# Patient Record
Sex: Female | Born: 1945 | Race: White | Hispanic: No | Smoking: Never smoker
Health system: Southern US, Community
[De-identification: ages and names within clinical notes are randomized; demographics above are authoritative.]

## PROBLEM LIST (undated history)

## (undated) DIAGNOSIS — F32A Depression, unspecified: Secondary | ICD-10-CM

## (undated) DIAGNOSIS — R251 Tremor, unspecified: Secondary | ICD-10-CM

## (undated) DIAGNOSIS — F419 Anxiety disorder, unspecified: Secondary | ICD-10-CM

## (undated) DIAGNOSIS — I10 Essential (primary) hypertension: Secondary | ICD-10-CM

## (undated) DIAGNOSIS — F329 Major depressive disorder, single episode, unspecified: Secondary | ICD-10-CM

## (undated) DIAGNOSIS — G20 Parkinson's disease: Secondary | ICD-10-CM

## (undated) DIAGNOSIS — E7800 Pure hypercholesterolemia, unspecified: Secondary | ICD-10-CM

## (undated) DIAGNOSIS — H269 Unspecified cataract: Secondary | ICD-10-CM

## (undated) DIAGNOSIS — F429 Obsessive-compulsive disorder, unspecified: Secondary | ICD-10-CM

## (undated) HISTORY — DX: Essential (primary) hypertension: I10

## (undated) HISTORY — DX: Pure hypercholesterolemia, unspecified: E78.00

## (undated) HISTORY — DX: Unspecified cataract: H26.9

## (undated) HISTORY — DX: Obsessive-compulsive disorder, unspecified: F42.9

## (undated) HISTORY — DX: Parkinson's disease: G20

## (undated) HISTORY — DX: Depression, unspecified: F32.A

## (undated) HISTORY — DX: Anxiety disorder, unspecified: F41.9

## (undated) HISTORY — DX: Major depressive disorder, single episode, unspecified: F32.9

## (undated) HISTORY — DX: Tremor, unspecified: R25.1

---
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
## 1979-02-16 HISTORY — PX: BREAST BIOPSY: SHX20

## 1998-04-22 ENCOUNTER — Other Ambulatory Visit: Admission: RE | Admit: 1998-04-22 | Discharge: 1998-04-22 | Payer: Self-pay | Admitting: Obstetrics and Gynecology

## 2006-03-20 ENCOUNTER — Emergency Department (HOSPITAL_COMMUNITY): Admission: EM | Admit: 2006-03-20 | Discharge: 2006-03-20 | Payer: Self-pay | Admitting: Emergency Medicine

## 2015-02-16 HISTORY — PX: CATARACT EXTRACTION: SUR2

## 2015-10-14 ENCOUNTER — Ambulatory Visit (INDEPENDENT_AMBULATORY_CARE_PROVIDER_SITE_OTHER): Payer: Medicare Other | Admitting: Neurology

## 2015-10-14 ENCOUNTER — Encounter: Payer: Self-pay | Admitting: Neurology

## 2015-10-14 VITALS — BP 164/90 | HR 73 | Ht 62.0 in | Wt 120.0 lb

## 2015-10-14 DIAGNOSIS — R5382 Chronic fatigue, unspecified: Secondary | ICD-10-CM | POA: Diagnosis not present

## 2015-10-14 DIAGNOSIS — E538 Deficiency of other specified B group vitamins: Secondary | ICD-10-CM

## 2015-10-14 DIAGNOSIS — G20C Parkinsonism, unspecified: Secondary | ICD-10-CM

## 2015-10-14 DIAGNOSIS — R413 Other amnesia: Secondary | ICD-10-CM

## 2015-10-14 DIAGNOSIS — R251 Tremor, unspecified: Secondary | ICD-10-CM | POA: Diagnosis not present

## 2015-10-14 DIAGNOSIS — G20A1 Parkinson's disease without dyskinesia, without mention of fluctuations: Secondary | ICD-10-CM | POA: Insufficient documentation

## 2015-10-14 DIAGNOSIS — G20 Parkinson's disease: Secondary | ICD-10-CM | POA: Diagnosis not present

## 2015-10-14 HISTORY — DX: Parkinson's disease: G20

## 2015-10-14 HISTORY — DX: Parkinsonism, unspecified: G20.C

## 2015-10-14 NOTE — Patient Instructions (Signed)
    We will get blood work today and get MRI evaluation of the brain.

## 2015-10-14 NOTE — Progress Notes (Signed)
Reason for visit: Tremor  Referring physician: Dr. Uvaldo Bristle is a 70 y.o. female  History of present illness:  Patricia Fitzpatrick is a 70 year old right-handed white female with a history of tremors that she states has been present for about 2 years. The tremors are noted on both arms, and are symmetric. She began first noticing some tremor on the jaw, a bit more prominent on the left side of the face than the right. The patient has reported some difficulty with handwriting with handwriting at times being somewhat small and sloppy. She has tremors as well noted with feeding herself. The tremors have not been extremely disabling for her, but are gradually worsening over time. She believes that a maternal grandmother may also have had tremors. The patient reports some difficulty with walking, she has had some occasional falls over the last 6 or 7 years, the last fall was about one year ago. The patient has also reports some mild changes with memory that have been present over the last 5 or 6 years. The patient has some mild short-term memory issues, occasionally she might get turned around with driving, she is able to keep up with medications and appointments fairly well. The patient has underlying psychiatric disease, followed by Dr. Toy Care for anxiety and OCD problems, the patient is only on Prozac, she has not been treated with any antipsychotic medications. She comes to this office for an evaluation.  Past Medical History:  Diagnosis Date  . Anxiety   . Cataracts, bilateral   . Depression   . High cholesterol   . Hypertension   . OCD (obsessive compulsive disorder)   . Tremor     Past Surgical History:  Procedure Laterality Date  . BREAST BIOPSY  1981  . EYE SURGERY  1952    Family History  Problem Relation Age of Onset  . Pulmonary disease Mother   . Alzheimer's disease Mother   . Heart disease Father   . Alzheimer's disease Father     Social history:  reports that she has  never smoked. She has never used smokeless tobacco. She reports that she drinks alcohol. She reports that she does not use drugs.  Medications:  Prior to Admission medications   Medication Sig Start Date End Date Taking? Authorizing Provider  FLUoxetine (PROZAC) 20 MG tablet Take 20 mg by mouth daily. Taking 3 tablets daily.   Yes Historical Provider, MD     Not on File  ROS:  Out of a complete 14 system review of symptoms, the patient complains only of the following symptoms, and all other reviewed systems are negative.  Weight loss, fatigue Palpitations of the heart Hearing loss, difficulty swallowing Itching Blurred vision, loss of vision, history of cataracts Shortness of breath, snoring Urinary incontinence easy bruising feeling hot, flushing Memory loss, confusion, numbness, weakness, slurred speech, difficulty swallowing, tremor Depression, anxiety, too much sleep, decreased energy, change in appetite, disinterest in activities, racing thoughts     Blood pressure (!) 164/90, pulse 73, height 5\' 2"  (1.575 m), weight 120 lb (54.4 kg).  Physical Exam  General: The patient is alert and cooperative at the time of the examination.  Eyes: Pupils are equal, round, and reactive to light. Discs are flat bilaterally.  Neck: The neck is supple, no carotid bruits are noted.  Respiratory: The respiratory examination is clear.  Cardiovascular: The cardiovascular examination reveals a regular rate and rhythm, no obvious murmurs or rubs are noted.  Skin: Extremities  are without significant edema.  Neurologic Exam  Mental status: The patient is alert and oriented x 3 at the time of the examination. The patient has apparent normal recent and remote memory, with an apparently normal attention span and concentration ability. Mini-Mental status examination done today shows a total score of 28/30. The patient is able to name 4 animals in 30 seconds.  Cranial nerves: Facial symmetry is  present. There is good sensation of the face to pinprick and soft touch bilaterally. The strength of the facial muscles and the muscles to head turning and shoulder shrug are normal bilaterally. Speech is well enunciated, no aphasia or dysarthria is noted. Extraocular movements are full. Visual fields are full. The tongue is midline, and the patient has symmetric elevation of the soft palate. No obvious hearing deficits are noted.The patient has prominent masking of the face, decreased blink frequency, some mild tremor involving the left lower face primarily.   Motor: The motor testing reveals 5 over 5 strength of all 4 extremities. Good symmetric motor tone is noted throughout.  Sensory: Sensory testing is intact to pinprick, soft touch, vibration sensation, and position sense on all 4 extremities. No evidence of extinction is noted.  Coordination: Cerebellar testing reveals good finger-nose-finger and heel-to-shin bilaterally.At times, minimal action type tremor seen in the upper extremities, right equal to left.   Gait and station: the patient is able to stand from a seated position with arms crossed. Once up, the patient is able to ambulate independently, but has prominent reduction in arm swing bilaterally. Tandem gait is minimally unsteady. Romberg is negative.   Reflexes: Deep tendon reflexes are symmetric and normal bilaterally. Toes are downgoing bilaterally.   Assessment/Plan:  1. Parkinsonism   2. Anxiety, OCD  3. Mild action tremor  4. Reported memory disturbance  The tremor itself does not appear to be consistent with Parkinson's disease, but the patient clearly has features of parkinsonism. She indicates that she has not been treated with any anti-psychotic medications. The patient will be sent for blood work today, she will have MRI of the brain. We will consider addition of medication such as Sinemet in low dose in the future. She will follow-up in 3 or 4 months. The memory  issues will be followed over time.  Patricia Alexanders MD 10/14/2015 8:49 AM  [facility] [address] [place], [place] [address]  Phone [phone] Fax [phone]

## 2015-10-15 LAB — VITAMIN B12: Vitamin B-12: 268 pg/mL (ref 211–946)

## 2015-10-15 LAB — SEDIMENTATION RATE: SED RATE: 2 mm/h (ref 0–40)

## 2015-10-15 LAB — TSH: TSH: 2.25 u[IU]/mL (ref 0.450–4.500)

## 2015-10-15 LAB — RPR: RPR Ser Ql: NONREACTIVE

## 2015-11-03 ENCOUNTER — Ambulatory Visit
Admission: RE | Admit: 2015-11-03 | Discharge: 2015-11-03 | Disposition: A | Discharge status: Home / Self Care | Payer: Medicare Other | Source: Ambulatory Visit | Attending: Neurology | Admitting: Neurology

## 2015-11-03 DIAGNOSIS — G20 Parkinson's disease: Secondary | ICD-10-CM

## 2015-11-03 DIAGNOSIS — R413 Other amnesia: Secondary | ICD-10-CM | POA: Diagnosis not present

## 2015-11-03 DIAGNOSIS — R251 Tremor, unspecified: Secondary | ICD-10-CM

## 2015-11-03 DIAGNOSIS — G20C Parkinsonism, unspecified: Secondary | ICD-10-CM

## 2015-11-04 ENCOUNTER — Telehealth: Payer: Self-pay | Admitting: Neurology

## 2015-11-04 NOTE — Telephone Encounter (Signed)
I called the patient. Minimal SVD, she is to go on low dose ASA, need to follow the blood pressure.     MRI brain 11/03/15:  IMPRESSION:  This MRI of the brain without contrast shows the following: 1.    Mild extent of T2/FLAIR hyperintense foci in the pons and subcortical deep white matter of the hemispheres consistent with mild chronic microvascular ischemic change. 2.    Minimal age appropriate cortical atrophy. 3.    There are no acute findings.

## 2015-11-04 NOTE — Telephone Encounter (Signed)
Pt called said Dr Jannifer Franklin told her this morning she had some mini strokes and had some questions. Please call [phone]

## 2015-11-04 NOTE — Telephone Encounter (Signed)
I called patient back. The small vessel changes are very minimal, but I would still recommend that the patient started baby aspirin a day.

## 2016-01-20 ENCOUNTER — Ambulatory Visit (INDEPENDENT_AMBULATORY_CARE_PROVIDER_SITE_OTHER): Payer: Medicare Other | Admitting: Neurology

## 2016-01-20 VITALS — BP 149/94 | HR 71 | Ht 62.0 in | Wt 122.0 lb

## 2016-01-20 DIAGNOSIS — R251 Tremor, unspecified: Secondary | ICD-10-CM

## 2016-01-20 DIAGNOSIS — G20 Parkinson's disease: Secondary | ICD-10-CM | POA: Diagnosis not present

## 2016-01-20 DIAGNOSIS — R413 Other amnesia: Secondary | ICD-10-CM | POA: Diagnosis not present

## 2016-01-20 MED ORDER — PRAMIPEXOLE DIHYDROCHLORIDE 0.125 MG PO TABS: ORAL_TABLET | ORAL | 3 refills | Status: DC

## 2016-01-20 NOTE — Progress Notes (Signed)
Reason for visit: Parkinson's disease  Patricia Fitzpatrick is an 70 y.o. female  History of present illness:  Patricia Fitzpatrick is a 70 year old right-handed white female with a history of tremor and a mild gait disorder. The patient also indicates that she is having some difficulty with memory. The patient has had symptoms of parkinsonism when last seen. This has evolved over time. The patient is having some stuttering steps at times when she tries to get up and walk, she has a tendency to go to the left. She has developed a tremor involving the tongue and the jaw. She has a left upper extremity tremor. She has not had any falls. She is not choking with swallowing. She continues to ruminate about her marital relationship and how she is under extreme stress with this. She is followed by Dr. Toy Care for her psychiatric issues. The patient returns for an evaluation.  Past Medical History:  Diagnosis Date  . Anxiety   . Cataracts, bilateral   . Depression   . High cholesterol   . Hypertension   . OCD (obsessive compulsive disorder)   . Parkinsonism ([place]) 10/14/2015  . Tremor     Past Surgical History:  Procedure Laterality Date  . BREAST BIOPSY  1981  . CATARACT EXTRACTION Right 2017  . EYE SURGERY  1952    Family History  Problem Relation Age of Onset  . Pulmonary disease Mother   . Alzheimer's disease Mother   . Heart disease Father   . Alzheimer's disease Father     Social history:  reports that she has never smoked. She has never used smokeless tobacco. She reports that she drinks alcohol. She reports that she does not use drugs.   No Known Allergies  Medications:  Prior to Admission medications   Medication Sig Start Date End Date Taking? Authorizing Provider  aspirin EC 81 MG tablet Take 81 mg by mouth daily.   Yes Historical Provider, MD  FLUoxetine (PROZAC) 20 MG capsule TAKE 3 CAPSULES BY MOUTH EVERY DAY 01/12/16  Yes Historical Provider, MD    ROS:  Out of a complete 14 system  review of symptoms, the patient complains only of the following symptoms, and all other reviewed systems are negative.  Bruising easily Numbness of the feet Tremors Agitation, decreased concentration, depression, anxiety Neck stiffness  Blood pressure (!) 149/94, pulse 71, height 5\' 2"  (1.575 m), weight 122 lb (55.3 kg).  Physical Exam  General: The patient is alert and cooperative at the time of the examination.  Skin: No significant peripheral edema is noted.   Neurologic Exam  Mental status: The patient is alert and oriented x 3 at the time of the examination. The patient has apparent normal recent and remote memory, with an apparently normal attention span and concentration ability. Mini-Mental Status Examination done today shows a total score 29/30.   Cranial nerves: Facial symmetry is present. Speech is normal, no aphasia or dysarthria is noted. Extraocular movements are full. Visual fields are full. Masking of the face is present, decreased eye blink is seen.  Motor: The patient has good strength in all 4 extremities.  Sensory examination: Soft touch sensation is symmetric on the face, arms, and legs.  Coordination: The patient has good finger-nose-finger and heel-to-shin bilaterally. A resting tremors noted with the left upper extremity on a present with walking as well.  Gait and station: The patient is able to arise from a seated position with arms crossed. Once up, the patient  ambulates without assistance, tremor seen with the left upper extremity, decreased arm swing is seen on the left. Tandem gait is normal. Romberg is negative. No drift is seen.  Reflexes: Deep tendon reflexes are symmetric.   Assessment/Plan:  1. Parkinson's disease  2. Mild memory disturbance  The patient is developing features of Parkinson's disease at this time, I have recommended initiating low-dose Mirapex. The patient seems to be hesitant about this medication therapy. She has agreed to  allow me to call in a prescription at this time, she will follow-up in 4-5 months. The patient continues to have significant overlying psychiatric overlay with her medical encounters.   Jill Alexanders MD 01/20/2016 8:41 AM  [facility] [address] [place], [place] [address]  Phone [phone] Fax [phone]

## 2016-01-20 NOTE — Patient Instructions (Addendum)
Call  If you desire to start medications for the Parkinson's disease.  Parkinson Disease Parkinson disease is a long-term (chronic) condition that gets worse over time (is progressive). Parkinson disease limits your ability to control your movements and move your body normally. This condition is a type of movement disorder. Each person with Parkinson disease is affected differently. The condition can range from mild to severe. Parkinson disease tends to progress slowly over several years. What are the causes? Parkinson disease results from a loss of brain cells (neurons) in a specific part of the brain (substantia nigra). Some of the neurons in the substantia nigra make an important brain chemical (dopamine). Dopamine is needed to control movement. As the condition gets worse, neurons make less dopamine. This makes it hard to move or control your movements. The exact cause of why neurons are lost or produce less dopamine is not known. Genetic and environmental factors may contribute to the cause of Parkinson disease. What increases the risk? This condition is more likely to develop in:  Men.  People who are 76 years of age or older.  People who have a family history of Parkinson disease. What are the signs or symptoms? Symptoms of this condition can vary from person to person. The main (primary) symptoms are related to movement (motor symptoms). These include:  Uncontrolled shaking movements (tremor). Tremors usually start in a hand or foot when you are resting (resting tremor). The tremor may stop when you move around.  Slowing of movement. You may lose facial expression and have trouble making the small movements that are needed to button clothing or brush your teeth. You may walk with short, shuffling steps.  Stiff movement (rigidity). This mostly affects your arms, legs, neck, and upper body. You may walk without swinging your arms. Rigidity can be painful.  Loss of balance and  stability when standing. You may sway, fall backward, and have trouble making turns. Secondary motor symptoms of this condition include:  Shrinking handwriting.  Stooped posture.  Slowed speech.  Trouble swallowing.  Drooling.  Sexual dysfunction.  Muscle cramps.  Loss of smell. Additional symptoms that are not related to movement include:  Constipation.  Mood swings.  Depression or anxiety.  Sleep disturbances.  Confusion.  Loss of mental abilities (dementia).  Low blood pressure.  Trouble concentrating. How is this diagnosed? Parkinson disease can be hard to diagnose in its early stages. A diagnosis may be made based on symptoms, a medical history, and physical exam. During your exam, your health care provider will look for:  Lack of facial expression.  Resting tremor.  Stiffness in your neck, arms, and legs.  Abnormal walk.  Trouble with balance. You may have brain imaging tests done to check for a loss of dopamine-producing areas of the brain. Your healthcare provider may also grade the severity of your condition as mild, moderate, or advanced. Parkinson disease progression is different for everyone. You may not progress to the advanced stage. Mild Parkinson disease involves:  Movement problems that do not affect daily activities.  Movement problems on one side of the body.  Movement problems that are controlled with medicines.  Good response to exercise. Moderate Parkinson disease involves:  Movement problems on both sides of the body.  Slowing of movement.  Coordination and balance problems.  Less of a response to medicine.  More side effects from medicines. Advanced Parkinson disease involves:  Extreme difficulty walking.  Inability to live alone safely.  Signs of dementia.  Difficulty controlling  symptoms with medicine. How is this treated? There is no cure for Parkinson disease. Treatment focuses on relieving your symptoms.  Treatment may include:  Medicines.  Speech, occupational, and physical therapy.  Surgery. Everyone responds to medicines differently. Your response may change over time. Work with your health care provider to find the best medicines for you. These may include:  Dopamine replacement drugs. These are the most effective medicines. A long-term side effect of these medicines is uncontrolled movements (dyskinesias).  Dopamine agonists. These drugs act like dopamine to stimulate dopamine receptors in the brain. Side effects include nausea and sleepiness, but they cause less dyskinesia.  Other medicines to reduce tremor, prevent dopamine breakdown, reduce dyskinesia, and reduce dementia that is related to Parkinson disease. Another treatment is deep brain stimulation surgery to reduce tremors and dyskinesia. This procedure involves placing electrodes in the brain. The electrodes are attached to an electric pulse generator that acts like a pacemaker for your brain. This may be an option if you have had the condition for at least four years and are not responding well to medicines. Follow these instructions at home:  Take over-the-counter and prescription medicines only as told by your health care provider.  Install grab bars and railings in your home to prevent falls.  Follow instructions from your health care provider about eating or drinking restrictions.  Return to your normal activities as told by your health care provider. Ask your health care provider what activities are safe for you.  Get regular exercise as told by your health care provider or a physical therapist.  Keep all follow-up visits as told by your health care provider. This is important. These include any visits with a speech therapist or occupational therapist.  Consider joining a support group for people with Parkinson disease. Contact a health care provider if:  Medicines do not help your symptoms.  You are unsteady or  have fallen at home.  You need more support to function well at home.  You have trouble swallowing.  You have severe constipation.  You are struggling with side effects from your medicines.  You see or hear things that are not real (hallucinate).  You feel confused, anxious, or depressed. Get help right away if:  You are injured after a fall.  You cannot swallow without choking.  You have chest pain or trouble breathing.  You do not feel safe at home. This information is not intended to replace advice given to you by your health care provider. Make sure you discuss any questions you have with your health care provider. Document Released: 01/30/2000 Document Revised: 07/07/2015 Document Reviewed: 11/22/2014 Elsevier Interactive Patient Education  2017 [employer].

## 2016-01-28 ENCOUNTER — Telehealth: Payer: Self-pay | Admitting: Neurology

## 2016-01-28 NOTE — Telephone Encounter (Signed)
Patient is calling to discuss the MRI she had about 3 months ago. She has questions.

## 2016-01-28 NOTE — Telephone Encounter (Signed)
Returned pt TC and reviewed MRI results with her again. Also answered questions re: Parkinson's disease and Mirapex. Verbalized understanding. Continues to voice concern about stress in her home environment due to caring for her husband w/ dementia and dealing w/ her "step-family." Appreciative of call.

## 2016-02-23 ENCOUNTER — Telehealth: Payer: Self-pay | Admitting: Neurology
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
## 2016-02-27 NOTE — Telephone Encounter (Signed)
I called the patient. She indicates that she had not been taking her Mirapex properly, she has gotten back on schedule now. She is still having some discomfort in her low back, she is taking Tylenol for this, she does not wish to pursue x-rays of the low back yet, but if the pain continues she is to call us back.

## 2016-02-27 NOTE — Telephone Encounter (Signed)
Pt is still experiencing pain.  Said she realized she has not been taking pramipexole (MIRAPEX) 0.125 MG tablet correctly. Pt said she called pharmacist and he advised her what to do. Please call

## 2016-03-08 MED ORDER — PREDNISONE 5 MG PO TABS
ORAL_TABLET | ORAL | 0 refills | Status: DC
Start: 2016-03-08 — End: 2016-06-22

## 2016-03-08 MED ORDER — METHOCARBAMOL 500 MG PO TABS: 500.0000 mg | ORAL_TABLET | Freq: Three times a day (TID) | ORAL | 1 refills | Status: DC

## 2016-03-08 NOTE — Addendum Note (Signed)
Addended by: Margette Fast on: 03/08/2016 11:01 AM   Modules accepted: Orders

## 2016-03-08 NOTE — Telephone Encounter (Signed)
I called patient. The patient is having ongoing low back pain. She is taking Tylenol for this taking 650 mg 6 times daily, I recommended to reduce this to 4 times a day.  I will call in a prednisone Dosepak, and Robaxin for the back pain, if she is not getting better, MRI of the back may need to be done.

## 2016-03-08 NOTE — Telephone Encounter (Signed)
Pt called said she has been taking [facility] arthritis strength tylenol for the pain but directions indicate not to take more than 10 days and it is getting close to the 10 day mark. She is also wanting to know if she should continue taking [facility] acetaminophen  500mg .  She thinks they could be helping.

## 2016-03-11 ENCOUNTER — Telehealth: Payer: Self-pay | Admitting: *Deleted

## 2016-03-11 NOTE — Telephone Encounter (Signed)
Pt. sts. she has had worsening constipation over the last month--believes this is due to Mirapex.  Sts. last stool was yesterday, smaller, harder than normal, much straining.  Is taking otc ducusate but not regularly.  I have advised taking stool softener more regularly, increasing fluids.  Would you like her to do anything else?

## 2016-03-11 NOTE — Telephone Encounter (Signed)
I called patient. The patient will take Colace and increase fluid intake, if this does not help, then adding MiraLAX on a daily basis should be done. I discussed this with the patient.

## 2016-04-06 ENCOUNTER — Telehealth: Payer: Self-pay | Admitting: Neurology

## 2016-04-06 NOTE — Telephone Encounter (Signed)
Dr Jannifer Franklin- please advise. I see back on 03/08/16 you stated if pain persisted, would recommend MRI.

## 2016-04-06 NOTE — Telephone Encounter (Signed)
I called patient. She is having mild mid back and thoracic discomfort mainly with bending and stooping. I have offered physical therapy, she does not wish to consider this option at this time. She is off of her Tylenol, she can get back on this for the discomfort. She will call me if she wishes to undergo physical therapy.

## 2016-04-06 NOTE — Telephone Encounter (Signed)
Patient having numbness, burning, stinging and tingling in her upper back for  6-7 weeks. Could this be from Parkinson's? Please call and advise.

## 2016-06-03 ENCOUNTER — Other Ambulatory Visit: Payer: Self-pay | Admitting: Neurology

## 2016-06-22 ENCOUNTER — Encounter (INDEPENDENT_AMBULATORY_CARE_PROVIDER_SITE_OTHER): Payer: Self-pay

## 2016-06-22 ENCOUNTER — Encounter: Payer: Self-pay | Admitting: Neurology

## 2016-06-22 ENCOUNTER — Ambulatory Visit (INDEPENDENT_AMBULATORY_CARE_PROVIDER_SITE_OTHER): Payer: Medicare Other | Admitting: Neurology

## 2016-06-22 DIAGNOSIS — R413 Other amnesia: Secondary | ICD-10-CM

## 2016-06-22 DIAGNOSIS — G20 Parkinson's disease: Secondary | ICD-10-CM

## 2016-06-22 MED ORDER — PRAMIPEXOLE DIHYDROCHLORIDE 0.5 MG PO TABS: 0.5000 mg | ORAL_TABLET | Freq: Three times a day (TID) | ORAL | 4 refills | Status: DC

## 2016-06-22 NOTE — Progress Notes (Signed)
Reason for visit: Parkinson's disease  Patricia Fitzpatrick is an 71 y.o. female  History of present illness:  Patricia Fitzpatrick is a 71 year old right-handed white female with a history of parkinsonism. The patient has been placed on Mirapex in low dose, she is currently on 0.25 mg 3 times daily and is tolerating the dose well. The patient does have some constipation issues, she takes docusate on a regular basis. She reports that she has had an occasional fall without injury since last seen. She has some difficulty getting up from a chair. She denies incontinence swallowing or choking. She is sleeping fairly well at night. She has developed some low back pain issues, she takes Tylenol on a scheduled basis, 4 times a day for this. She has not wished to undergo physical therapy mainly because of transportation issues. She has good days and bad days with the low back pain. She reports that her memory has been relatively stable since last seen. She has not had any hallucinations on the medication. She returns to this office for an evaluation.   Past Medical History:  Diagnosis Date  . Anxiety   . Cataracts, bilateral   . Depression   . High cholesterol   . Hypertension   . OCD (obsessive compulsive disorder)   . Parkinsonism ([place]) 10/14/2015  . Tremor     Past Surgical History:  Procedure Laterality Date  . BREAST BIOPSY  1981  . CATARACT EXTRACTION Right 2017  . EYE SURGERY  1952    Family History  Problem Relation Age of Onset  . Pulmonary disease Mother   . Alzheimer's disease Mother   . Heart disease Father   . Alzheimer's disease Father     Social history:  reports that she has never smoked. She has never used smokeless tobacco. She reports that she drinks alcohol. She reports that she does not use drugs.   No Known Allergies  Medications:  Prior to Admission medications   Medication Sig Start Date End Date Taking? Authorizing Provider  aspirin 81 MG tablet Take 81 mg by mouth daily.    Yes [provider]  FLUoxetine (PROZAC) 20 MG capsule TAKE 3 CAPSULES BY MOUTH EVERY DAY 01/12/16  Yes [provider]  pramipexole (MIRAPEX) 0.125 MG tablet Take 2 tablets by mouth three times daily 06/03/16  Yes Kathrynn Ducking, MD    ROS:  Out of a complete 14 system review of symptoms, the patient complains only of the following symptoms, and all other reviewed systems are negative.  Tremor Memory disturbance  There were no vitals taken for this visit.  Physical Exam  General: The patient is alert and cooperative at the time of the examination.  Skin: No significant peripheral edema is noted.   Neurologic Exam  Mental status: The patient is alert and oriented x 3 at the time of the examination. The patient has apparent normal recent and remote memory, with an apparently normal attention span and concentration ability. Mini-Mental Status Examination done today shows a total score 29/30.   Cranial nerves: Facial symmetry is present. Speech is normal, no aphasia or dysarthria is noted. Extraocular movements are full. Visual fields are full. Masking of the face is seen.  Motor: The patient has good strength in all 4 extremities.  Sensory examination: Soft touch sensation is symmetric on the face, arms, and legs.  Coordination: The patient has good finger-nose-finger and heel-to-shin bilaterally.  Gait and station: The patient is able to arise from  a seated position with arms crossed. Once up, she is able to ambulate independently, there is bilateral arm swing, but this is decreased on the left relative to the right. Tandem gait is unsteady. Romberg is negative. No drift is seen.  Reflexes: Deep tendon reflexes are symmetric.   Assessment/Plan:  1. Parkinson's disease  2. Mild memory disturbance  3. Chronic low back pain  The patient will be increased on Mirapex gradually phasing in a 0.5 mg tablet once each week until she is on 0.5 mg 3 times daily.  The patient will call me if she has side effects on the medication. She will follow-up in 5 months. We will continue to follow the memory issue. The patient will let me know if she requires further treatment for the low back pain. A prescription for the 0.5 mg Mirapex tablets was called in.  Jill Alexanders MD 06/22/2016 8:05 AM  [facility] [address] [place], [place] [address]  Phone [phone] Fax [phone]

## 2016-06-22 NOTE — Patient Instructions (Signed)
   We will go up on the mirapex to 0.5 mg three times a day. We will transition slowly.  Week 1 take 0.25 / 0.25 / 0.5   Week 2 take 0.5 / 0.25 / 0.5   Week 3 take 0.5 three times a day.   You are on the 0.125 mg tablets currently taking 2 three times a day. Two of the 0.125 mg tablets equal 0.25 mg  Mirapex (pramipexole) may result in confusion or hallucinations, dizziness, drowsiness, or nausea. If any significant side effects are noted, please contact our office.

## 2016-07-01 ENCOUNTER — Telehealth: Payer: Self-pay | Admitting: Neurology

## 2016-07-01 DIAGNOSIS — M545 Low back pain: Principal | ICD-10-CM

## 2016-07-01 DIAGNOSIS — G8929 Other chronic pain: Secondary | ICD-10-CM

## 2016-07-01 MED ORDER — PREDNISONE 5 MG PO TABS: ORAL_TABLET | ORAL | 0 refills | Status: DC

## 2016-07-01 MED ORDER — METHOCARBAMOL 500 MG PO TABS: 500.0000 mg | ORAL_TABLET | Freq: Three times a day (TID) | ORAL | 0 refills | Status: DC

## 2016-07-01 NOTE — Addendum Note (Signed)
Addended by: Kathrynn Ducking on: 07/01/2016 11:21 AM   Modules accepted: Orders

## 2016-07-01 NOTE — Telephone Encounter (Signed)
Patient called office in reference to picking/moving bag of laundry.  Patient said it kills her to get out of the bed and while moving back will start hurting and tighten up.  Patient would like to see if Dr. Jannifer Franklin is able to call in something for the pain/discomfort.  Pharmacy- [facility].  Please call

## 2016-07-01 NOTE — Telephone Encounter (Signed)
I called patient. The patient hurt her back several days ago lifting a bag of laundry. She has had back pain issues previously.  The patient has pain in the right lower back, no radiation down the leg.  I will send in a prescription for a prednisone Dosepak, 5 mg 6 day pack, and a 10 day supply of methocarbamol.

## 2016-07-05 MED ORDER — TRAMADOL HCL 50 MG PO TABS: 50.0000 mg | ORAL_TABLET | Freq: Four times a day (QID) | ORAL | 1 refills | Status: DC | PRN

## 2016-07-05 NOTE — Telephone Encounter (Signed)
Patient has called again and requested to know when someone is going to call her back. I informed her that the nurse and Dr. Jannifer Franklin are in with patients but I would send another phone note back.

## 2016-07-05 NOTE — Telephone Encounter (Signed)
The patient is having ongoing pain of the low back without radiation down the legs. The pain is severe. She is on Robaxin, prednisone Dosepak was given without benefit, I will call in a prescription for Ultram, we will get MRI of the lumbar spine.

## 2016-07-05 NOTE — Telephone Encounter (Signed)
Pt wants to know if she is to continue with the prednisone while taking tramadol. Please call

## 2016-07-05 NOTE — Telephone Encounter (Signed)
Pt calling back to Dr Jannifer Franklin asking if something else can be called in for her discomfort, she has mentioned her pain and need of a stronger muscle relaxer

## 2016-07-05 NOTE — Telephone Encounter (Signed)
I called the patient. She is to finish the prednisone.

## 2016-07-05 NOTE — Telephone Encounter (Signed)
Patient called back requesting to speak with the nurse or Dr. Jannifer Franklin regarding the rx she requested. She states that it has not been received by the pharmacy.

## 2016-07-05 NOTE — Addendum Note (Signed)
Addended by: Kathrynn Ducking on: 07/05/2016 01:12 PM   Modules accepted: Orders

## 2016-07-05 NOTE — Telephone Encounter (Signed)
Called to let patient know I faxed printed/signed rx tramadol to her pharmacy. Received confirmation.   Also advised MRI will have to be approved via insurance first and then someone will call to schedule. She verbalized understanding.

## 2016-07-06 MED ORDER — CYCLOBENZAPRINE HCL 5 MG PO TABS: 5.0000 mg | ORAL_TABLET | Freq: Three times a day (TID) | ORAL | 1 refills | Status: DC

## 2016-07-06 NOTE — Telephone Encounter (Signed)
I called patient. She is still having significant back pain. The prednisone and the Ultram have not been very helpful. The methocarbamol is not helping, we will try Flexeril instead. The MRI of the low back is pending.

## 2016-07-06 NOTE — Telephone Encounter (Signed)
Pt called back today said she is having severe pain and wants to know whether she should push thru the pain or go slowly with movements? Please call

## 2016-07-06 NOTE — Addendum Note (Signed)
Addended by: Kathrynn Ducking on: 07/06/2016 06:32 PM   Modules accepted: Orders

## 2016-07-07 ENCOUNTER — Telehealth: Payer: Self-pay | Admitting: Neurology

## 2016-07-07 ENCOUNTER — Encounter: Payer: Self-pay | Admitting: *Deleted

## 2016-07-07 MED ORDER — HYDROMORPHONE HCL 2 MG PO TABS: 2.0000 mg | ORAL_TABLET | ORAL | 0 refills | Status: DC | PRN

## 2016-07-07 NOTE — Telephone Encounter (Signed)
I called patient. I will write a prescription for Dilaudid, she will need to come in and pick it up.

## 2016-07-07 NOTE — Telephone Encounter (Signed)
I called patient. She was given a prescription for Flexeril taking 5 mg 3 times a day. I told her to take the medication as prescribed, not take the medication 4 times a day.

## 2016-07-07 NOTE — Telephone Encounter (Signed)
Pt calling back asking for a call with your suggestion as to what hours during the day should she take the medication for the maximum effect.  Pt would also like to know if she can take 4 instead of 3. Please call on mobile

## 2016-07-07 NOTE — Progress Notes (Signed)
Placed printed/signed rx dilaudid up front for pt pick up.

## 2016-07-07 NOTE — Telephone Encounter (Signed)
Pt called back said she would like to take the pain medication that was discussed this morning. Said her daughter-in-law will pick up RX today, she is not on DPR, she is aware and said she does not know how to get it. Pt request to speak with Dr Jannifer Franklin.

## 2016-07-07 NOTE — Telephone Encounter (Signed)
Patient called for ongoing back pain through answering service, and called back 20 minutes later to cancel the request for call back. CD

## 2016-07-07 NOTE — Addendum Note (Signed)
Addended by: Kathrynn Ducking on: 07/07/2016 11:51 AM   Modules accepted: Orders

## 2016-07-12 ENCOUNTER — Telehealth: Payer: Self-pay | Admitting: Neurology

## 2016-07-12 NOTE — Telephone Encounter (Signed)
Patricia Fitzpatrick called me 3 times on Friday in PM , reporting she had most severe pain in her back and hip.  She wanted Tramadol, which cannot be refilled on the week end. She understood , but sounded incoherent. I advised her to try heat and ice , and to call back on Tuesday. She also wanted a CT back to be converted to an MRI of the back- I asked her to discuss that with You, Lanny Hurst.  CD

## 2016-07-16 ENCOUNTER — Telehealth: Payer: Self-pay | Admitting: Neurology

## 2016-07-16 NOTE — Telephone Encounter (Signed)
Pt called in stated that she had LBP since January and has been on tramadol. But no significant improvement. Dr. Jannifer Franklin prescribed dilaudid to her on 07/07/16. However, she did not come to pick up the prescription until yesterday. Her back pain actually getting better recently. Today she filled the medication and on reading the side effects of dilaudid, she had concern about side effect of constipation and her pharmacist warned her to discuss with doctor before taking the medication. She called in and worry about the side effects.   I told her that dilaudid is a strong pain medication. If her back pain is actually getting better, she can continue tramadol and no need to take dilaudid. If the pain getting worse, she wants to give dilaudid a try, I think it is reasonable. She expressed understanding and appreciation.  Rosalin Hawking, MD PhD Stroke Neurology 07/16/2016 2:05 PM

## 2016-07-17 ENCOUNTER — Ambulatory Visit
Admission: RE | Admit: 2016-07-17 | Discharge: 2016-07-17 | Disposition: A | Discharge status: Home / Self Care | Payer: Medicare Other | Source: Ambulatory Visit | Attending: Neurology | Admitting: Neurology

## 2016-07-17 DIAGNOSIS — G8929 Other chronic pain: Secondary | ICD-10-CM

## 2016-07-17 DIAGNOSIS — M545 Low back pain: Principal | ICD-10-CM

## 2016-07-19 ENCOUNTER — Telehealth: Payer: Self-pay | Admitting: Neurology

## 2016-07-19 DIAGNOSIS — S22000A Wedge compression fracture of unspecified thoracic vertebra, initial encounter for closed fracture: Secondary | ICD-10-CM

## 2016-07-19 NOTE — Telephone Encounter (Signed)
Patient calling back saying she has had changes in bowel and urine. Please call to discuss.

## 2016-07-19 NOTE — Telephone Encounter (Signed)
Pt called back. She is needing clarification: 1) significant compression fx- could that have likely happened 02/19/16 when she dropped the microwave and hurt her back 2) please clarify nerve draped over the spine (did she hear that correctly) 3) extremely limited- does that mean when she is in significant pain or a twinge Please call

## 2016-07-19 NOTE — Telephone Encounter (Signed)
Pt has called requested Dr Jannifer Franklin to call again please. She needs clarification.

## 2016-07-19 NOTE — Telephone Encounter (Signed)
I called patient. The MRI shows a significant compression fracture at the T11 level with a posterior bulge that does not appear to be injuring the spinal cord, but the spinal cord is draped over the bony fragment. The patient does not report any bowel or bladder control issues or leg weakness. I have recommended a nurse surgical consultation if the pain level is intolerable or the patient is limited in her daily activities because of discomfort.  She will call me back if she decides that she wishes to have a neurosurgical consultation.   MRI lumbar 07/18/16:  IMPRESSION:  This MRI of the lumbar spine without contrast shows the following: 1.    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  There is also mild retrolisthesis at this level. 2.    Mild degenerative changes in the lumbar spine with mild facet hypertrophy noted at most levels.   There is no nerve root compression. There is no spinal stenosis in the lumbar spine.

## 2016-07-19 NOTE — Telephone Encounter (Signed)
   I called the patient. I discussed the MRI again with her. She is to call if the pain remains severe.  She has had some problems with constipation while on pain medications. She may need to use MiraLAX on a daily basis, she has been using Dulcolax.

## 2016-07-19 NOTE — Telephone Encounter (Signed)
Called patient back. Went over MRI results again. She will call if pain remains severe and we can place referral for neurosurgery. She verbalized understanding and appreciation for call.

## 2016-07-20 NOTE — Telephone Encounter (Signed)
Patient called office and would like to speak with Dr. Jannifer Franklin about proceeding with the referral to neurosurgery.  Please call

## 2016-07-20 NOTE — Telephone Encounter (Signed)
I called patient. Patient wishes to have a consultation with a neurosurgeon. I will get this set up.

## 2016-07-20 NOTE — Addendum Note (Signed)
Addended by: Kathrynn Ducking on: 07/20/2016 01:10 PM   Modules accepted: Orders

## 2016-07-21 NOTE — Telephone Encounter (Signed)
I called patient. The patient wanted to know how much restriction she should have on her activity, she is not to do any heavy lifting more than 10 or 15 pounds, she should not perform activities such as climbing a letter that might put her at risk for falls.  We are awaiting consultation from a neurosurgeon.

## 2016-07-21 NOTE — Telephone Encounter (Signed)
Patient wants a call back to discuss her back. She is waiting a call from the neurosurgeons office and I suggested that she call neurosurgeon but she wants a call back from our office.

## 2016-07-22 ENCOUNTER — Other Ambulatory Visit: Payer: Self-pay | Admitting: Neurology

## 2016-07-26 ENCOUNTER — Telehealth: Payer: Self-pay | Admitting: Neurology

## 2016-07-26 NOTE — Telephone Encounter (Signed)
error 

## 2016-07-26 NOTE — Telephone Encounter (Signed)
Pt said she has decided to use miralax. She is wanting if there are any special instructions.

## 2016-07-26 NOTE — Telephone Encounter (Signed)
Tried calling pt back. Phone continued to ring, unable to LVM.

## 2016-07-27 NOTE — Telephone Encounter (Signed)
Pt would like a call back to clarify once again how she is to take the Miralax, please call

## 2016-07-27 NOTE — Telephone Encounter (Signed)
Called patient back. Advised her I see previously where CW,MD suggested she take Miralax once daily. Follow directions on bottle. Do not take more than once per day. Can mix powder in 8oz water, ect. She verbalized understanding and appreciation for call.

## 2016-07-27 NOTE — Telephone Encounter (Signed)
Called and spoke with patient. Went over instructions again. She verbalized understanding.

## 2016-08-01 ENCOUNTER — Telehealth: Payer: Self-pay | Admitting: Neurology

## 2016-08-01 NOTE — Telephone Encounter (Signed)
She called Sunday night that she was experiencing more pain (pain was better for a couple weeks but worsened when this afternoon).  She had taken a tramadol without benefit and wanted to know if ok to take a hydromorphone.  I let her know she could do so.  If concerned about the medication, she could try a 1/2 dose first.  I let her know I would pass the message to you

## 2016-08-03 NOTE — Telephone Encounter (Signed)
Patient called office in reference to taking HYDROmorphone (DILAUDID) 2 MG tablet patient took a whole tablet at 6:30pm and again at 9:30am.  Patient states as of right now she is not in any pain unless she is having to weight/pressure on her hands she will see stars due to pain in lower spinal area.

## 2016-08-03 NOTE — Telephone Encounter (Signed)
I called patient. The patient has had some increased pain, she has taken hydromorphone for this with some benefit. She does have an appointment to see a nurse surgeon in the near future. She has intermittent muscle spasms of the back. I indicated that is okay to take Tylenol with the hydromorphone.

## 2016-08-07 ENCOUNTER — Telehealth: Payer: Self-pay | Admitting: Neurology

## 2016-08-07 NOTE — Telephone Encounter (Signed)
Patient called regarding taking her pain med, hydromorphone due to back pain.  She had additional questions about her back injury.  She has an appointment next week with a spine specialist and was advised to AVOID narcotic pain medication due to side effect and complication concerns, d/t underlying parkinsonism and age above 52. She is encouraged to limit her pain medication to tylenol and tramadol prn.  I encouraged her to address her back injury/damage related questions with the spine doctor, thankfully, appt pending soon.  She voiced understanding and agreement. She asked if she should keep or discard her hydromorphone tabs and was advised to ask Dr. Jannifer Franklin for further guidance in this matter, encouraged to call back during work week for this.

## 2016-08-09 NOTE — Telephone Encounter (Signed)
Pt wanted Dr Jannifer Franklin to be made aware that she went back on her tramadol and it has helped her tremendously, she is asking to be called re: the spasms in her back

## 2016-08-09 NOTE — Telephone Encounter (Signed)
I called the patient. The Flexeril can be taken for the back spasms.  The patient can take the Dilaudid if she needs it, but if the Ultram is working for the pain, she will stay with the Ultram.

## 2016-08-09 NOTE — Telephone Encounter (Signed)
Patient is returning Dr. Jannifer Franklin' call. She can be reached at [phone].

## 2016-08-09 NOTE — Telephone Encounter (Signed)
I called the patient. She has Flexeril at home taking the 5 mg tablets. She can take 1 every 8 hours if needed for neck spasms.  She will be seeing the neurosurgeon this week.

## 2016-08-11 NOTE — Telephone Encounter (Signed)
Patricia Fitzpatrick calling stating she saw Dr. Vertell Limber yesterday had x-rays and went over MRI results and scheduled another MRI. She had a little fall last night but is ok today. She took HYDROmorphone (DILAUDID) 2 MG tablet around 2:30 this morning and traMADol (ULTRAM) 50 MG tablet around 8:30 this morning.

## 2016-08-11 NOTE — Telephone Encounter (Signed)
Events noted, I did not call the patient.

## 2016-08-16 NOTE — Telephone Encounter (Signed)
I called patient. The patient is having constipation on pain medications and inactivity. She is not drinking a lot of fluids.  She will increase her fluid intake, she is taking MiraLAX which she will continue. She will go on Dulcolax tablets.

## 2016-08-16 NOTE — Telephone Encounter (Addendum)
Pt called said she is constipated again. She is not eating or drinking like she should. Said she is in a lot of pain with her back.Please call her back at [phone]

## 2016-08-16 NOTE — Telephone Encounter (Signed)
Returned call to patient - she is still having significant back pain, despite taking Dilaudid 2mg , QID, Flexeril 5mg , TID, and OTC Tylenol.  She has been evaluated by Dr. Vertell Limber - states she has MRI scans pending (thoracic and repeat lumbar).  She is also having difficulty with constipation.  She has been using Miralax daily but has not had a bowel movement in the last week.

## 2016-08-24 ENCOUNTER — Telehealth: Payer: Self-pay | Admitting: Neurology

## 2016-08-24 NOTE — Telephone Encounter (Signed)
Patient called office to notify Dr. Jannifer Franklin that she has a broken back advised with Dr. Vertell Limber and patient will be going tomorrow to get fitted for a back brace tomorrow.  Patient states she feels loopy, and yesterday she was hearing noises (humming).  Patient has stopped taking HYDROmorphone and is now taking Tramadol.

## 2016-08-24 NOTE — Telephone Encounter (Signed)
Events noted, I did not call the patient.

## 2016-08-25 NOTE — Telephone Encounter (Signed)
Patient called office again to see about message she called about yesterday.  I advised patient that Dr. Jannifer Franklin has noted he events in her chart.  Patient then states she was wondering if pramipexole (MIRAPEX) 0.5 MG tablet has anything to do with symptoms she has been having.

## 2016-08-25 NOTE — Telephone Encounter (Signed)
I called the patient. The patient is having a noise in her ears sounding like monks chanting, she wonders if the Mirapex is the cause of this, I told her that I did not think so, this may be a form of tinnitus.

## 2016-08-28 ENCOUNTER — Telehealth: Payer: Self-pay | Admitting: Neurology

## 2016-08-28 NOTE — Telephone Encounter (Signed)
The patient called. She is hearing her own voice when she talks, not at other times, likely related to inequality of air pressure in middle ear. Will watch for now.

## 2016-09-08 ENCOUNTER — Telehealth: Payer: Self-pay | Admitting: Neurology

## 2016-09-08 NOTE — Telephone Encounter (Signed)
I called patient. The patient had several concerns. She has a brace to wear for her back, she didn't know how long she was supposed to wear this, I asked her to contact the neurosurgery office to prescribe the brace.  She is concerned about weight loss, this may be associated with pain in the use of opiate medications, she is now off the opiate medications, if she desires will give her a medication for boosting appetite. She does not wish to start medication now.  Thirdly, she indicates that she has had some problems with hearing music, this is not previously in her past. It went away spontaneously. I indicated that Dr. Toy Care, her psychiatrist may need to know about this.

## 2016-09-08 NOTE — Telephone Encounter (Signed)
Pt called this morning. She has an extensive conversation she wants to have with Dr Jannifer Franklin at his convenience.

## 2016-09-24 ENCOUNTER — Telehealth: Payer: Self-pay | Admitting: Neurology

## 2016-09-24 NOTE — Telephone Encounter (Signed)
Pt called the office said she is loosing we

## 2016-09-24 NOTE — Telephone Encounter (Signed)
Pt calling wanting to know what her weight was on 06-22-2016 appointment, she went to Dr Vertell Limber on 8-8 and was told she weighed 111.2 she is asking to be called

## 2016-09-24 NOTE — Telephone Encounter (Signed)
Tried calling patient back. Got busy signal, unable to LVM. If she calls, please let her know we do not have a weight from last visit.  Last known documented weight is from 01/20/16 which was 122 lb

## 2016-09-24 NOTE — Telephone Encounter (Signed)
Tried calling pt again, got busy signal. Please relay message if she calls

## 2016-09-27 NOTE — Telephone Encounter (Signed)
Called patient again since unable to reach previously. Relayed no weight done on 06/22/16 appt. Last known weight back in December 122lb. She verbalized understanding.

## 2016-11-03 ENCOUNTER — Other Ambulatory Visit: Payer: Self-pay | Admitting: Neurology

## 2016-11-03 ENCOUNTER — Telehealth: Payer: Self-pay | Admitting: *Deleted

## 2016-11-03 MED ORDER — PRAMIPEXOLE DIHYDROCHLORIDE 0.5 MG PO TABS: 0.5000 mg | ORAL_TABLET | Freq: Three times a day (TID) | ORAL | 4 refills | Status: DC

## 2016-11-03 NOTE — Telephone Encounter (Signed)
Mirapex escribed to [facility] per request/fim

## 2016-11-11 ENCOUNTER — Telehealth: Payer: Self-pay | Admitting: Neurology

## 2016-11-11 NOTE — Telephone Encounter (Signed)
Pt has called to have RN Terrence Dupont notify Dr Jannifer Franklin that she has a fractured back and it twinges more within the last week.  Pt states that her balance is off a little and she has noticed more shakiness in her left hand.  Pt states RN may want to let Dr Jannifer Franklin know.  Pt has not requested a call back

## 2016-11-11 NOTE — Telephone Encounter (Signed)
I called patient. The patient has noted some increased problems getting up out of a chair and some increased tremors, she may have some progression of her Parkinson's disease, she will be seen in office on 11/23/2016. I will evaluate her Parkinson's disease at that time.  The patient currently is followed by Dr.Stern for her back, she would like a second opinion through an orthopedic surgeon.

## 2016-11-18 NOTE — Telephone Encounter (Signed)
Pt called said she is planning on coming to appt on 10/9 if her brother will bring her. She is having left sided shaking, wobbly, increased numbness top of feet and the pads of her feet, salivating at the corners of the mouth. The patient said she will most definitely try to come to the appt.   FYI

## 2016-11-18 NOTE — Telephone Encounter (Signed)
Noted, we will evaluate all of these issues when the patient is seen in the office.

## 2016-11-23 ENCOUNTER — Encounter: Payer: Self-pay | Admitting: Neurology

## 2016-11-23 ENCOUNTER — Telehealth: Payer: Self-pay | Admitting: Neurology

## 2016-11-23 ENCOUNTER — Ambulatory Visit (INDEPENDENT_AMBULATORY_CARE_PROVIDER_SITE_OTHER): Payer: Medicare Other | Admitting: Neurology

## 2016-11-23 VITALS — BP 140/82 | HR 88 | Ht 62.0 in | Wt 112.0 lb

## 2016-11-23 DIAGNOSIS — G20 Parkinson's disease: Secondary | ICD-10-CM | POA: Diagnosis not present

## 2016-11-23 DIAGNOSIS — R413 Other amnesia: Secondary | ICD-10-CM | POA: Diagnosis not present

## 2016-11-23 DIAGNOSIS — R251 Tremor, unspecified: Secondary | ICD-10-CM | POA: Diagnosis not present

## 2016-11-23 MED ORDER — CARBIDOPA-LEVODOPA 25-100 MG PO TABS: 0.5000 | ORAL_TABLET | Freq: Three times a day (TID) | ORAL | 5 refills | Status: DC

## 2016-11-23 NOTE — Telephone Encounter (Signed)
Called and spoke with pt. Addressed concerns about visit. She has next f/u scheduled with MM,NP. She will call back if she has further questions/concerns.

## 2016-11-23 NOTE — Patient Instructions (Signed)
   We will start Sinemet 25/100 mg tablet taking 1/2 tablet three times a day.  Sinemet (carbidopa) may result in confusion or hallucinations, drowsiness, nausea, or dizziness. If any significant side effects are noted, please contact our office. Sinemet may not be well absorbed when taken with high protein meals, if tolerated it is best to take 30-45 minutes before you eat.

## 2016-11-23 NOTE — Progress Notes (Signed)
Reason for visit: Parkinson's disease  Patricia Fitzpatrick is an 71 y.o. female  History of present illness:  Patricia Fitzpatrick is a 71 year old right-handed white female with a history of Parkinson's disease. The patient has been on low-dose Mirapex taking 0.5 mg 3 times daily. She is tolerating the dose fairly well, she has noted some recent changes in her functional level. She has had some increasing gait instability, she has not sustained any falls. She has noted some increased drooling, she denies issues with choking with swallowing. She reports fatigue, increased shortness of breath. She does have ongoing low back pain, she is followed through neurosurgery for this. She has a mild memory disturbance, she occasionally will lose track of her train of thought, but in general her memory has not worsened much over time.  The main thrust of her visit today was to discuss her family issues once again, she is under significant stress taking care of her demented husband, and she has other family members in the house that are stressful to her, one of them is a Ship broker. The patient rarely leaves the house. The patient is followed through psychiatry for her underlying psychiatric issues.  Past Medical History:  Diagnosis Date  . Anxiety   . Cataracts, bilateral   . Depression   . High cholesterol   . Hypertension   . OCD (obsessive compulsive disorder)   . Parkinsonism ([place]) 10/14/2015  . Tremor     Past Surgical History:  Procedure Laterality Date  . BREAST BIOPSY  1981  . CATARACT EXTRACTION Right 2017  . EYE SURGERY  1952    Family History  Problem Relation Age of Onset  . Pulmonary disease Mother   . Alzheimer's disease Mother   . Heart disease Father   . Alzheimer's disease Father     Social history:  reports that she has never smoked. She has never used smokeless tobacco. She reports that she drinks alcohol. She reports that she does not use drugs.   No Known Allergies  Medications:    Prior to Admission medications   Medication Sig Start Date End Date Taking? Authorizing Provider  Acetaminophen (ACETAMIN PO) Take by mouth.   Yes [provider]  alendronate (FOSAMAX) 70 MG tablet TK 1 T PO EVERY WEEK WITH 8 OUNCE OF WATER NEEDS TO REMAIN UPRIGHT FOR 30 MINUTES AND NO FOOD OR DRINK FOR 30 MINUTES 09/23/16  Yes [provider]  Ascorbic Acid (VITAMIN C PO) Take by mouth.   Yes [provider]  aspirin 81 MG tablet Take 81 mg by mouth daily.   Yes [provider]  b complex vitamins capsule Take 1 capsule by mouth daily.   Yes [provider]  Cholecalciferol (VITAMIN D-3 PO) Take by mouth.   Yes [provider]  Coenzyme Q10 (CO Q 10 PO) Take by mouth.   Yes [provider]  COLLAGEN PO Take by mouth.   Yes [provider]  CRANBERRY FRUIT PO Take by mouth.   Yes [provider]  DOCUSATE SODIUM PO Take by mouth.   Yes [provider]  FLUoxetine (PROZAC) 20 MG capsule TAKE 3 CAPSULES BY MOUTH EVERY DAY 01/12/16  Yes [provider]  pramipexole (MIRAPEX) 0.5 MG tablet Take 1 tablet (0.5 mg total) by mouth 3 (three) times daily. 11/03/16  Yes Kathrynn Ducking, MD    ROS:  Out of a complete 14 system review of symptoms, the patient complains only of  the following symptoms, and all other reviewed systems are negative.  Fatigue, weight loss Hearing loss, ringing in the ears, difficulty swallowing, drooling Cough, shortness of breath, chest tightness Palpitations of the heart Constipation Insomnia, frequent waking Frequency of urination Joint swelling, walking difficulty Skin rash Agitation, decreased concentration, depression, anxiety  Blood pressure 140/82, pulse 88, height 5\' 2"  (1.575 m), weight 112 lb (50.8 kg), SpO2 98 %.  Physical Exam  General: The patient is alert and cooperative at the time of the examination.  Skin: No significant peripheral edema is  noted.   Neurologic Exam  Mental status: The patient is alert and oriented x 3 at the time of the examination. The patient has apparent normal recent and remote memory, with an apparently normal attention span and concentration ability. Mini-Mental Status Examination done today shows total score 26/30.   Cranial nerves: Facial symmetry is present. Speech is normal, no aphasia or dysarthria is noted. Extraocular movements are full. Visual fields are full. Significant masking of the face is seen, decreased eye blink.  Motor: The patient has good strength in all 4 extremities.  Sensory examination: Soft touch sensation is symmetric on the face, arms, and legs.  Coordination: The patient has good finger-nose-finger and heel-to-shin bilaterally. The patient somewhat tremulous with both upper extremities.  Gait and station: The patient is able to arise from a seated position with arms crossed. Once up she is able to ambulate independently. Tandem gait is slightly unsteady. Romberg is negative. No drift is seen.  Reflexes: Deep tendon reflexes are symmetric.   MRI lumbar 07/18/16:  IMPRESSION: This MRI of the lumbar spine without contrast shows the following: 1. 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 There is also mild retrolisthesis at this level. 2. Mild degenerative changes in the lumbar spine with mild facet hypertrophy noted at most levels. There is no nerve root compression. There is no spinal stenosis in the lumbar spine.  * MRI scan images were reviewed online. I agree with the written report.    Assessment/Plan:  1. Parkinson's disease  2. Mild memory disturbance  3. Low back pain, T11 compression fracture  The patient has had some increased issues with functioning. The patient will be given Sinemet taking the 25/100 mg tablet, one half tablet 3 times daily. She will  continue the Mirapex taking 0.5 mg 3 times daily. She will follow-up in 5 months.  Many of the problems discussed today were social issues, the patient may require some assistance through a Education officer, museum or even an Forensic psychologist.  Jill Alexanders MD 11/23/2016 12:39 PM  [facility] [address] [place], [place] [address]  Phone [phone] Fax [phone]

## 2016-11-23 NOTE — Telephone Encounter (Signed)
Pt is asking for a call from RN Terrence Dupont to discuss a few things.  Please call pt on home #

## 2017-01-20 ENCOUNTER — Telehealth: Payer: Self-pay

## 2017-01-20 NOTE — Telephone Encounter (Signed)
No change in the medications at this time.

## 2017-01-20 NOTE — Telephone Encounter (Signed)
Patient would like to speak with Terrence Dupont regarding some of her medications. Please call at your convenience.

## 2017-01-20 NOTE — Telephone Encounter (Signed)
Called and spoke with patient. She states she has been taking sinemet 25-100mg  tablet (1/2 tablet TID) as prescribed. She is doing well on medication, no SE.   She wanted to let Dr. Jannifer Franklin know she had an unusual episode today. She was sitting on a pile of laundry for awhile sorting through things and when she went to stand up, her legs started to shake. Sx have resolved since. She was sitting on her knees.  I recommended she continue to monitor and if sx reoccur and if she has any new sx, she can call and let us know. It may have been related to the way she was sitting.  She is wanting to know if she should increase dose of sinemet. I advised per last office note, CW,MD has no mention of increasing her dose. Reminded her of f/u on 04/25/17.  Advised we will call back if we want her to make any changes. I will forward to CW,MD for review. She verbalized understanding.

## 2017-04-25 ENCOUNTER — Encounter: Payer: Self-pay | Admitting: Adult Health

## 2017-04-25 ENCOUNTER — Ambulatory Visit: Payer: Medicare Other | Admitting: Adult Health

## 2017-04-25 VITALS — BP 160/91 | HR 82 | Wt 119.4 lb

## 2017-04-25 DIAGNOSIS — G20 Parkinson's disease: Secondary | ICD-10-CM

## 2017-04-25 NOTE — Progress Notes (Signed)
I have read the note, and I agree with the clinical assessment and plan.  Mikela Senn K Lakeithia Rasor   

## 2017-04-25 NOTE — Patient Instructions (Signed)
Your Plan:  Continue sinemet and mirapex If your symptoms worsen or you develop new symptoms please let us know.   Thank you for coming to see [place] at [facility]. I hope we have been able to provide you high quality care today.  You may receive a patient satisfaction survey over the next few weeks. We would appreciate your feedback and comments so that we may continue to improve ourselves and the health of our patients.

## 2017-04-25 NOTE — Progress Notes (Signed)
PATIENT: Patricia Fitzpatrick DOB: 05-Aug-1945  REASON FOR VISIT: follow up HISTORY FROM: patient  HISTORY OF PRESENT ILLNESS: Today 04/25/17  This is a 72 year old female with a history of Parkinson's disease.  She returns today for follow-up.  She remains on Mirapex taking 0.5 mg 3 times a day.  At the last visit she was started on Sinemet 25-100 mg taking a half a tablet 3 times a day.  She reports that she has found this beneficial.  She reports that the only tremor she has is in her jaw.  She does note that this may be due to anxiety and stress.  She denies any trouble swallowing.  Denies any trouble sleeping.  Reports a good appetite.  She states that she did participate in home physical therapy ordered by Dr. Lynann Bologna.  She reports that she feels that her gait is better.  She has not had any falls.  The patient is mainly concerned about her personal situation.  Reports that she has a Psychiatrist and her brother living at her house.  She reports that the situation is not pleasant and is the cause of most of her anxiety and stress.  She returns today for evaluation.  HISTORY Ms. Joerger is a 72 year old right-handed white female with a history of Parkinson's disease. The patient has been on low-dose Mirapex taking 0.5 mg 3 times daily. She is tolerating the dose fairly well, she has noted some recent changes in her functional level. She has had some increasing gait instability, she has not sustained any falls. She has noted some increased drooling, she denies issues with choking with swallowing. She reports fatigue, increased shortness of breath. She does have ongoing low back pain, she is followed through neurosurgery for this. She has a mild memory disturbance, she occasionally will lose track of her train of thought, but in general her memory has not worsened much over time.  The main thrust of her visit today was to discuss her family issues once again, she is under significant stress taking care of her  demented husband, and she has other family members in the house that are stressful to her, one of them is a Ship broker. The patient rarely leaves the house. The patient is followed through psychiatry for her underlying psychiatric issues.   REVIEW OF SYSTEMS: Out of a complete 14 system review of symptoms, the patient complains only of the following symptoms, and all other reviewed systems are negative.  Memory loss, numbness, weakness, tremors, bruise easily, activity change, appetite change, fatigue, hearing loss, drooling, cough, shortness of breath, palpitations  ALLERGIES: No Known Allergies  HOME MEDICATIONS: Outpatient Medications Prior to Visit  Medication Sig Dispense Refill  . Acetaminophen (ACETAMIN PO) Take by mouth.    Marland Kitchen alendronate (FOSAMAX) 70 MG tablet TK 1 T PO EVERY WEEK WITH 8 OUNCE OF WATER NEEDS TO REMAIN UPRIGHT FOR 30 MINUTES AND NO FOOD OR DRINK FOR 30 MINUTES  2  . Ascorbic Acid (VITAMIN C PO) Take by mouth.    Marland Kitchen aspirin 81 MG tablet Take 81 mg by mouth daily.    . carbidopa-levodopa (SINEMET IR) 25-100 MG tablet Take 0.5 tablets by mouth 3 (three) times daily. 45 tablet 5  . Cholecalciferol (VITAMIN D-3 PO) Take by mouth.    . Coenzyme Q10 (CO Q 10 PO) Take by mouth.    . COLLAGEN PO Take by mouth.    . DOCUSATE SODIUM PO Take by mouth.    Marland Kitchen FLUoxetine (PROZAC)  20 MG capsule TAKE 3 CAPSULES BY MOUTH EVERY DAY  11  . Multiple Vitamin (MULTIVITAMIN) tablet Take 1 tablet by mouth daily.    . pramipexole (MIRAPEX) 0.5 MG tablet Take 1 tablet (0.5 mg total) by mouth 3 (three) times daily. 90 tablet 4  . traMADol (ULTRAM) 50 MG tablet 04/25/17 Not taken lately  0  . b complex vitamins capsule Take 1 capsule by mouth daily.    Marland Kitchen CRANBERRY FRUIT PO Take by mouth.     No facility-administered medications prior to visit.     PAST MEDICAL HISTORY: Past Medical History:  Diagnosis Date  . Anxiety   . Cataracts, bilateral   . Depression   . High cholesterol   .  Hypertension   . OCD (obsessive compulsive disorder)   . Parkinsonism ([place]) 10/14/2015  . Tremor     PAST SURGICAL HISTORY: Past Surgical History:  Procedure Laterality Date  . BREAST BIOPSY  1981  . CATARACT EXTRACTION Right 2017  . EYE SURGERY  1952    FAMILY HISTORY: Family History  Problem Relation Age of Onset  . Pulmonary disease Mother   . Alzheimer's disease Mother   . Heart disease Father   . Alzheimer's disease Father     SOCIAL HISTORY: Social History   Socioeconomic History  . Marital status: Divorced    Spouse name: Not on file  . Number of children: 0  . Years of education: Bachelors  . Highest education level: Not on file  Social Needs  . Financial resource strain: Not on file  . Food insecurity - worry: Not on file  . Food insecurity - inability: Not on file  . Transportation needs - medical: Not on file  . Transportation needs - non-medical: Not on file  Occupational History  . Occupation: Retired  Tobacco Use  . Smoking status: Never Smoker  . Smokeless tobacco: Never Used  Substance and Sexual Activity  . Alcohol use: Yes    Comment: One glass per year.  . Drug use: No  . Sexual activity: Not on file  Other Topics Concern  . Not on file  Social History Narrative   Lives at home with husband, adult stepdaughter, and brother.   Right-handed.   No caffeine use.      PHYSICAL EXAM  Vitals:   04/25/17 1118  BP: (!) 160/91  Pulse: 82  Weight: 119 lb 6.4 oz (54.2 kg)   Body mass index is 21.84 kg/m.  Generalized: Well developed, in no acute distress   Neurological examination  Mentation: Alert oriented to time, place, history taking. Follows all commands speech and language fluent mild masking of the face noted.. Cranial nerve II-XII: Pupils were equal round reactive to light. Extraocular movements were full, visual field were full on confrontational test. Facial sensation and strength were normal. Uvula tongue midline. Head turning  and shoulder shrug  were normal and symmetric.  Tremor noted in the jaw. Motor: The motor testing reveals 5 over 5 strength of all 4 extremities. Good symmetric motor tone is noted throughout.  Sensory: Sensory testing is intact to soft touch on all 4 extremities. No evidence of extinction is noted.  Coordination: Cerebellar testing reveals good finger-nose-finger and heel-to-shin bilaterally.  Gait and station: Patient is able to stand without assistance.  Good stride and armswing.  Good turns.  Tandem gait is normal.  Romberg is negative. Reflexes: Deep tendon reflexes are symmetric and normal bilaterally.   DIAGNOSTIC DATA (LABS, IMAGING, TESTING) - I  reviewed patient records, labs, notes, testing and imaging myself where available.       ASSESSMENT AND PLAN 72 y.o. year old female  has a past medical history of Anxiety, Cataracts, bilateral, Depression, High cholesterol, Hypertension, OCD (obsessive compulsive disorder), Parkinsonism ([place]) (10/14/2015), and Tremor. here with:   1.  Parkinson's disease  Overall the patient is doing well.  She will continue on Mirapex 0.5 mg 3 times a day and Sinemet 25-100 mg half a tablet 3 times a day.  She is advised that if her symptoms worsen or she develops new symptoms she should let us know.  She will follow-up in 6 months or sooner if needed.   I spent 15 minutes with the patient. 50% of this time was spent discussing the patient's stress and anxiety.   Ward Givens, MSN, NP-C 04/25/2017, 11:31 AM [facility] [address] [place], [place] [address] [phone]

## 2017-05-04 ENCOUNTER — Other Ambulatory Visit: Payer: Self-pay | Admitting: Neurology

## 2017-05-09 ENCOUNTER — Telehealth: Payer: Self-pay | Admitting: Adult Health

## 2017-05-09 NOTE — Telephone Encounter (Signed)
Pt called said her step-daughter has read up on Neurontin and she is wanting to try it for nerve pain in her back. She said she has improved since finishing PT but there is still some residual pain and thinks this might help. Please call to advise. Pharmacy: [facility]/[place]

## 2017-05-09 NOTE — Telephone Encounter (Signed)
Left detailed VM on mobile number advising patient to contact, Dr Lynann Bologna who ordered her PT to prescribe Gabapentin. Advised her that taken with Mirapex she may have more drowsiness, balance issues. Advised she call tomorrow to discuss.

## 2017-05-09 NOTE — Telephone Encounter (Signed)
Gabapentin ok to take with sinement. Taken with mirapex may cause CNS depression such as drowsiness, confusion, changes with gait and balance. Dr. Lynann Bologna would be the one to prescribe gabapentin.

## 2017-05-20 ENCOUNTER — Other Ambulatory Visit: Payer: Self-pay | Admitting: Neurology

## 2017-06-01 ENCOUNTER — Telehealth: Payer: Self-pay | Admitting: *Deleted

## 2017-06-01 NOTE — Telephone Encounter (Addendum)
I called pt back. She states she had been on tramadol in the past prescribed by Dr. Jannifer Franklin d/t fracture in back.  It was recently prescribed on 05/30/17 by Dr. Lynann Bologna (orthopaedics). Tramadol 50mg  tablet. Directions: Take 1 tablet q6hr prn qty 20. She started having "sounds' in her head again. She was unable to describe further. Reports she sometimes has what sounds like an echo in left ear.  She wanted to know if tramadol was the medication that caused this sx last year when she was taking it. She could not recall.  I checked her records in [place] while she was on the phone and it appears from phone note on 08/24/16 that it was the Dilaudid that she previously reported to cause "humming" noises. She verbalized understanding. She has stopped the gabapentin and tramadol. She was wanting to know if there was an anti-inflammatory med she could take.  I recommended she contact Dr. Lynann Bologna office to advise on how she should proceed since she was prescribing these medications.  She was agreeable to this plan. No need for call back. She will call if she has further questions/concerns.

## 2017-11-02 ENCOUNTER — Encounter: Payer: Self-pay | Admitting: Adult Health

## 2017-11-02 ENCOUNTER — Ambulatory Visit: Payer: Medicare Other | Admitting: Adult Health

## 2017-11-02 VITALS — BP 126/82 | HR 68 | Ht 62.0 in | Wt 127.4 lb

## 2017-11-02 DIAGNOSIS — M545 Low back pain, unspecified: Secondary | ICD-10-CM

## 2017-11-02 DIAGNOSIS — G20 Parkinson's disease: Secondary | ICD-10-CM

## 2017-11-02 DIAGNOSIS — G8929 Other chronic pain: Secondary | ICD-10-CM | POA: Diagnosis not present

## 2017-11-02 MED ORDER — CARBIDOPA-LEVODOPA 25-100 MG PO TABS: 0.5000 | ORAL_TABLET | Freq: Three times a day (TID) | ORAL | 11 refills | Status: DC

## 2017-11-02 NOTE — Progress Notes (Signed)
I have read the note, and I agree with the clinical assessment and plan.  Charles K Willis   

## 2017-11-02 NOTE — Patient Instructions (Signed)
Your Plan:  Continue mirapex and sinemet If your symptoms worsen or you develop new symptoms please let us know.   Thank you for coming to see [place] at [facility]. I hope we have been able to provide you high quality care today.  You may receive a patient satisfaction survey over the next few weeks. We would appreciate your feedback and comments so that we may continue to improve ourselves and the health of our patients.

## 2017-11-02 NOTE — Progress Notes (Signed)
PATIENT: Patricia Fitzpatrick DOB: 1945/02/26  REASON FOR VISIT: follow up HISTORY FROM: patient  HISTORY OF PRESENT ILLNESS: Today 11/02/17: Ms. Banes is a 72 year old female with a history of Parkinson's disease.  She returns today for follow-up.  She is currently taking Mirapex 0.5 mg 3 times a day as well as Sinemet 25-100 mg half a tablet 3 times a day.  She reports that she is doing well.  Her tremor is minimal and usually affects the jaw area.  She denies any changes with her gait or balance.  Denies any falls.  Reports good appetite.  Denies any trouble swallowing.  She reports that she does have some low back pain that radiates to the thoracic region.  In the past she has seen Dr. Vertell Limber but has not followed up.  She returns today for evaluation.  HISTORY is a 72 year old female with a history of Parkinson's disease.  She returns today for follow-up.  She remains on Mirapex taking 0.5 mg 3 times a day.  At the last visit she was started on Sinemet 25-100 mg taking a half a tablet 3 times a day.  She reports that she has found this beneficial.  She reports that the only tremor she has is in her jaw.  She does note that this may be due to anxiety and stress.  She denies any trouble swallowing.  Denies any trouble sleeping.  Reports a good appetite.  She states that she did participate in home physical therapy ordered by Dr. Lynann Bologna.  She reports that she feels that her gait is better.  She has not had any falls.  The patient is mainly concerned about her personal situation.  Reports that she has a Psychiatrist and her brother living at her house.  She reports that the situation is not pleasant and is the cause of most of her anxiety and stress.  She returns today for evaluation.   REVIEW OF SYSTEMS: Out of a complete 14 system review of symptoms, the patient complains only of the following symptoms, and all other reviewed systems are negative.  ALLERGIES: No Known Allergies  HOME  MEDICATIONS: Outpatient Medications Prior to Visit  Medication Sig Dispense Refill  . Acetaminophen (ACETAMIN PO) Take by mouth.    Marland Kitchen alendronate (FOSAMAX) 70 MG tablet TK 1 T PO EVERY WEEK WITH 8 OUNCE OF WATER NEEDS TO REMAIN UPRIGHT FOR 30 MINUTES AND NO FOOD OR DRINK FOR 30 MINUTES  2  . Ascorbic Acid (VITAMIN C PO) Take by mouth.    Marland Kitchen aspirin 81 MG tablet Take 81 mg by mouth daily.    . carbidopa-levodopa (SINEMET IR) 25-100 MG tablet TAKE 1/2 TABLET BY MOUTH THREE TIMES DAILY 45 tablet 5  . Cholecalciferol (VITAMIN D-3 PO) Take by mouth.    . Coenzyme Q10 (CO Q 10 PO) Take by mouth.    . COLLAGEN PO Take by mouth.    . DOCUSATE SODIUM PO Take by mouth.    Marland Kitchen FLUoxetine (PROZAC) 20 MG capsule TAKE 3 CAPSULES BY MOUTH EVERY DAY  11  . Multiple Vitamin (MULTIVITAMIN) tablet Take 1 tablet by mouth daily.    . pramipexole (MIRAPEX) 0.5 MG tablet TAKE 1 TABLET(0.5 MG) BY MOUTH THREE TIMES DAILY 270 tablet 3  . traMADol (ULTRAM) 50 MG tablet 04/25/17 Not taken lately  0   No facility-administered medications prior to visit.     PAST MEDICAL HISTORY: Past Medical History:  Diagnosis Date  . Anxiety   .  Cataracts, bilateral   . Depression   . High cholesterol   . Hypertension   . OCD (obsessive compulsive disorder)   . Parkinsonism ([place]) 10/14/2015  . Tremor     PAST SURGICAL HISTORY: Past Surgical History:  Procedure Laterality Date  . BREAST BIOPSY  1981  . CATARACT EXTRACTION Right 2017  . EYE SURGERY  1952    FAMILY HISTORY: Family History  Problem Relation Age of Onset  . Pulmonary disease Mother   . Alzheimer's disease Mother   . Heart disease Father   . Alzheimer's disease Father     SOCIAL HISTORY: Social History   Socioeconomic History  . Marital status: Divorced    Spouse name: Not on file  . Number of children: 0  . Years of education: Bachelors  . Highest education level: Not on file  Occupational History  . Occupation: Retired  Scientific laboratory technician  .  Financial resource strain: Not on file  . Food insecurity:    Worry: Not on file    Inability: Not on file  . Transportation needs:    Medical: Not on file    Non-medical: Not on file  Tobacco Use  . Smoking status: Never Smoker  . Smokeless tobacco: Never Used  Substance and Sexual Activity  . Alcohol use: Yes    Comment: One glass per year.  . Drug use: No  . Sexual activity: Not on file  Lifestyle  . Physical activity:    Days per week: Not on file    Minutes per session: Not on file  . Stress: Not on file  Relationships  . Social connections:    Talks on phone: Not on file    Gets together: Not on file    Attends religious service: Not on file    Active member of club or organization: Not on file    Attends meetings of clubs or organizations: Not on file    Relationship status: Not on file  . Intimate partner violence:    Fear of current or ex partner: Not on file    Emotionally abused: Not on file    Physically abused: Not on file    Forced sexual activity: Not on file  Other Topics Concern  . Not on file  Social History Narrative   Lives at home with husband, adult stepdaughter, and brother.   Right-handed.   No caffeine use.      PHYSICAL EXAM  Vitals:   11/02/17 0859  BP: 126/82  Pulse: 68  Weight: 127 lb 6.4 oz (57.8 kg)  Height: 5\' 2"  (1.575 m)   Body mass index is 23.3 kg/m.  Generalized: Well developed, in no acute distress   Neurological examination  Mentation: Alert oriented to time, place, history taking. Follows all commands speech and language fluent Cranial nerve II-XII: Pupils were equal round reactive to light. Extraocular movements were full, visual field were full on confrontational test. Facial sensation and strength were normal. Uvula tongue midline. Head turning and shoulder shrug  were normal and symmetric. Motor: The motor testing reveals 5 over 5 strength of all 4 extremities. Good symmetric motor tone is noted throughout.   Sensory: Sensory testing is intact to soft touch on all 4 extremities. No evidence of extinction is noted.  Coordination: Cerebellar testing reveals good finger-nose-finger and heel-to-shin bilaterally.  Gait and station: Patient is able to stand from a sitting position.  Patient has good stride and good armswing.  Tandem gait is unsteady. Reflexes: Deep tendon  reflexes are symmetric and normal bilaterally.   DIAGNOSTIC DATA (LABS, IMAGING, TESTING) - I reviewed patient records, labs, notes, testing and imaging myself where available.   Lab Results  Component Value Date   VITAMINB12 268 10/14/2015   Lab Results  Component Value Date   TSH 2.250 10/14/2015      ASSESSMENT AND PLAN 72 y.o. year old female  has a past medical history of Anxiety, Cataracts, bilateral, Depression, High cholesterol, Hypertension, OCD (obsessive compulsive disorder), Parkinsonism ([place]) (10/14/2015), and Tremor. here with:  1.  Parkinson's disease 2. Back pain  Overall patient is doing well.  She will continue on Mirapex and Sinemet.  She was advised to follow-up with Dr. Vertell Limber regarding her back pain.  She is advised that if her symptoms worsen or she develops new symptoms she should let us know.  She will follow-up in 6 months or sooner if needed.     Ward Givens, MSN, NP-C 11/02/2017, 8:54 AM [facility] [address] [place], [place] [address] [phone]

## 2018-01-31 ENCOUNTER — Telehealth: Payer: Self-pay | Admitting: Adult Health

## 2018-01-31 NOTE — Telephone Encounter (Signed)
Busy

## 2018-01-31 NOTE — Telephone Encounter (Signed)
Pt requesting a call stating she is her husbands sole caretaker but she has parkinsons and a fractured back stating it's very difficult to take care of him and she isn't sure how much longer she can. Medicare denied home-health aid pt unsure if Dr. Jannifer Franklin will write a letter stating the need for her to have someone there to help her with her husband. Please advise.

## 2018-01-31 NOTE — Telephone Encounter (Signed)
Busy. Will try later.

## 2018-02-01 NOTE — Telephone Encounter (Signed)
Spoke to pts wife.  She feels like she has taken care of the issue at hand, (her husband has dementia, neuropathy, cannot walk, is in hospital bed).  Was some confusion about HH in home care  (therapy vs aide).  She feels that has been resolved.  I told her that if it is due to her husbands issues, his medical team would be the best one to touch base with in regards to caring for him (getting assistance).

## 2018-05-04 ENCOUNTER — Telehealth: Payer: Self-pay | Admitting: Adult Health

## 2018-05-04 ENCOUNTER — Encounter: Payer: Self-pay | Admitting: Adult Health

## 2018-05-04 NOTE — Telephone Encounter (Signed)
Patient calling you back call her on the house phone. Thanks Hinton Dyer

## 2018-05-04 NOTE — Telephone Encounter (Signed)
Pt is asking for a call from RN to discuss how she is doing and if she should keep her upcoming appointment.  Please call

## 2018-05-04 NOTE — Telephone Encounter (Addendum)
I called pt back.   She stated that since she has PD, and getting over URI (has taken ABX and mucinex) that she thought best not to make appt 05-12-18 with Dr. Jannifer Franklin. Thought that at her age and with the covid19 scare she would remain close to home.    She would hold off for now and call back.  She feels like her PD sx :  Jaw tremor, swallowing, gait/balance are slightly worse.  She has had falls (no injuries).  She remains on CL 25/100 1/2 tablet po tid, and mirapex 0.5mg  po tid.   I told her I would forward to Dr. Jannifer Franklin.

## 2018-05-04 NOTE — Telephone Encounter (Signed)
Events noted.  The patient will contact our office when she is ready to have a revisit.

## 2018-05-04 NOTE — Telephone Encounter (Signed)
LMVM for pt that returned call.  °

## 2018-05-12 ENCOUNTER — Ambulatory Visit: Payer: Medicare Other | Admitting: Neurology

## 2018-05-30 ENCOUNTER — Encounter: Payer: Self-pay | Admitting: Adult Health

## 2018-05-30 ENCOUNTER — Telehealth: Payer: Self-pay | Admitting: Adult Health

## 2018-05-30 NOTE — Telephone Encounter (Signed)
Noted  

## 2018-05-30 NOTE — Addendum Note (Signed)
Addended by: Brandon Melnick on: 05/30/2018 12:28 PM   Modules accepted: Orders

## 2018-05-30 NOTE — Telephone Encounter (Signed)
LMVM for pt to return call about sx.  Mentioned that MM/NP on  Maternity leave seeing another NP in VV or telphone visit.

## 2018-05-30 NOTE — Telephone Encounter (Signed)
Pt has called stating she feels her Pt's parkinson's is worsening.  Pt feels this may be because of the recent passing of her husband and the concerns about the virus.  Pt is asking for a call to discuss a request to increase her pramipexole (MIRAPEX) 0.5 MG tablet and carbidopa-levodopa (SINEMET IR) 25-100 MG tablet

## 2018-05-30 NOTE — Telephone Encounter (Signed)
Spoke to pt.  She is having worsening sx she feels from PD.  Her sx coincide with husband passing and the covid 19 virus scare  Stress related?  She is not sure.  States having harder time getting up from floor when working on her crafts, Tired, weak,  Not necessarily tremors she states.  I told her that can convert appt to VV but she would prefer TELEPHONE VISIT.  I updated her chart.  She is taking CL25/100 1/2 tablet po TID and Mirapex 0.5mg  po TID.  Ok to see NP?

## 2018-05-30 NOTE — Telephone Encounter (Signed)
I contacted the pt and was able to schedule a 11:30 am appt with Dr. Jannifer Franklin on 05/31/18.  Pt understands that although there may be some limitations with this type of visit, we will take all precautions to reduce any security or privacy concerns.  Pt understands that this will be treated like an in office visit and we will file with pt's insurance, and there may be a patient responsible charge related to this service.  Pt states she does not have the capability to complete a virtual visit.   Pt confirmed the best call back # is [phone]. EMR has been updated.

## 2018-05-31 ENCOUNTER — Encounter: Payer: Self-pay | Admitting: Neurology

## 2018-05-31 ENCOUNTER — Ambulatory Visit (INDEPENDENT_AMBULATORY_CARE_PROVIDER_SITE_OTHER): Payer: PPO | Admitting: Neurology

## 2018-05-31 ENCOUNTER — Other Ambulatory Visit: Payer: Self-pay

## 2018-05-31 DIAGNOSIS — G20 Parkinson's disease: Secondary | ICD-10-CM | POA: Diagnosis not present

## 2018-05-31 MED ORDER — CARBIDOPA-LEVODOPA 25-100 MG PO TABS: 1.0000 | ORAL_TABLET | Freq: Three times a day (TID) | ORAL | 3 refills | Status: DC

## 2018-05-31 MED ORDER — PRAMIPEXOLE DIHYDROCHLORIDE 0.5 MG PO TABS: ORAL_TABLET | ORAL | 3 refills | Status: DC

## 2018-05-31 NOTE — Progress Notes (Signed)
     Virtual Visit via Telephone Note  I connected with Patricia Fitzpatrick on 05/31/18 at 11:30 AM EDT by telephone and verified that I am speaking with the correct person using two identifiers.   I discussed the limitations, risks, security and privacy concerns of performing an evaluation and management service by telephone and the availability of in person appointments. I also discussed with the patient that there may be a patient responsible charge related to this service. The patient expressed understanding and agreed to proceed.   History of Present Illness: Patricia Fitzpatrick is a 73 year old right-handed white female with a history of Parkinson's disease.  The patient has been on a relatively low dose of Sinemet taking the 25/100 mg tablets taking 1/2 tablet 3 times daily.  She remains on Mirapex taking 0.5 mg 3 times daily.  She tolerates these medications well.  She lost her husband in January 2020, she found that caring for him was quite stressful, this stress has been alleviated.  The patient reports no falls, she is using a cane off and on for her walking.  She has good days and bad days with her walking, occasionally she feels that she starts walking too fast and cannot stop herself.  She does report some occasional hallucinations at night when she wakes up.  She denies any vivid dreams.  Occasionally, she may have some difficulty with swallowing but this is a minor problem for her.  She has also recently noted some mild changes in memory with short-term memory, she may misplace things about the house frequently.  She is still able to perform all activities of daily living, she is able to keep up with medications and appointments and operate a motor vehicle.  She also reports some decreased hearing and will be evaluated for this in the near future.  She overall is getting along fairly well.   Observations/Objective: The telephone interview reveals that the patient is alert and cooperative, the patient  is answering questions appropriately.  Speech is well enunciated, not aphasic or dysarthric.  Assessment and Plan: 1.  Parkinson's disease  2.  Mild gait disorder  3.  Reports of mild memory problem  The memory issues will need to be followed over time.  The patient will go up on her Sinemet taking the 25/100 mg tablet 3 times daily.  She will follow-up here in about 4 months.  She will contact me if she does not tolerate her Sinemet increase.  Follow Up Instructions:    I discussed the assessment and treatment plan with the patient. The patient was provided an opportunity to ask questions and all were answered. The patient agreed with the plan and demonstrated an understanding of the instructions.   The patient was advised to call back or seek an in-person evaluation if the symptoms worsen or if the condition fails to improve as anticipated.  I provided 30 minutes of non-face-to-face time during this encounter.   Kathrynn Ducking, MD

## 2018-07-26 DIAGNOSIS — M810 Age-related osteoporosis without current pathological fracture: Secondary | ICD-10-CM | POA: Diagnosis not present

## 2018-07-26 DIAGNOSIS — I10 Essential (primary) hypertension: Secondary | ICD-10-CM | POA: Diagnosis not present

## 2018-07-26 DIAGNOSIS — E7849 Other hyperlipidemia: Secondary | ICD-10-CM | POA: Diagnosis not present

## 2018-07-27 DIAGNOSIS — Z1339 Encounter for screening examination for other mental health and behavioral disorders: Secondary | ICD-10-CM | POA: Diagnosis not present

## 2018-07-27 DIAGNOSIS — D692 Other nonthrombocytopenic purpura: Secondary | ICD-10-CM | POA: Diagnosis not present

## 2018-07-27 DIAGNOSIS — E785 Hyperlipidemia, unspecified: Secondary | ICD-10-CM | POA: Diagnosis not present

## 2018-07-27 DIAGNOSIS — G20 Parkinson's disease: Secondary | ICD-10-CM | POA: Diagnosis not present

## 2018-07-27 DIAGNOSIS — R627 Adult failure to thrive: Secondary | ICD-10-CM | POA: Diagnosis not present

## 2018-07-27 DIAGNOSIS — Z1331 Encounter for screening for depression: Secondary | ICD-10-CM | POA: Diagnosis not present

## 2018-07-27 DIAGNOSIS — M810 Age-related osteoporosis without current pathological fracture: Secondary | ICD-10-CM | POA: Diagnosis not present

## 2018-07-27 DIAGNOSIS — H9190 Unspecified hearing loss, unspecified ear: Secondary | ICD-10-CM | POA: Diagnosis not present

## 2018-07-27 DIAGNOSIS — I10 Essential (primary) hypertension: Secondary | ICD-10-CM | POA: Diagnosis not present

## 2018-07-27 DIAGNOSIS — Z0000 Encounter for general adult medical examination without abnormal findings: Secondary | ICD-10-CM | POA: Diagnosis not present

## 2018-07-27 DIAGNOSIS — R634 Abnormal weight loss: Secondary | ICD-10-CM | POA: Diagnosis not present

## 2018-07-27 DIAGNOSIS — F329 Major depressive disorder, single episode, unspecified: Secondary | ICD-10-CM | POA: Diagnosis not present

## 2018-07-27 DIAGNOSIS — R32 Unspecified urinary incontinence: Secondary | ICD-10-CM | POA: Diagnosis not present

## 2018-07-27 DIAGNOSIS — Z9189 Other specified personal risk factors, not elsewhere classified: Secondary | ICD-10-CM | POA: Diagnosis not present

## 2018-08-07 DIAGNOSIS — R2231 Localized swelling, mass and lump, right upper limb: Secondary | ICD-10-CM | POA: Diagnosis not present

## 2018-08-10 ENCOUNTER — Telehealth: Payer: Self-pay | Admitting: Neurology

## 2018-08-10 ENCOUNTER — Other Ambulatory Visit: Payer: Self-pay | Admitting: Adult Health

## 2018-08-10 NOTE — Telephone Encounter (Signed)
I called the patient.  The patient has had some alteration in her balance, she has fallen 3 times in the last 10 days.  I will try to get in the office for a face-to-face visit sometime in the next couple weeks.  The patient may require physical therapy for gait.

## 2018-08-10 NOTE — Telephone Encounter (Signed)
I attempted to reach the pt. Pt needs a f/u in two weeks. I was going to advise I am waiting for an appt to become available. Will call back at a later time.

## 2018-08-10 NOTE — Telephone Encounter (Signed)
Pt called in and stated she has had 3 falls within the last 2 weeks , she states it is better to remain up right, she states she is unsure whats going on but she feels as if she needs to speak with someone over the phone or been seen in office.

## 2018-08-14 NOTE — Telephone Encounter (Signed)
I reached out to the pt and scheduled appt for 09/06/18 at 4 pm check in time of 330.

## 2018-08-24 ENCOUNTER — Encounter (HOSPITAL_BASED_OUTPATIENT_CLINIC_OR_DEPARTMENT_OTHER): Payer: Self-pay | Admitting: *Deleted

## 2018-08-24 ENCOUNTER — Emergency Department (HOSPITAL_BASED_OUTPATIENT_CLINIC_OR_DEPARTMENT_OTHER): Payer: PPO

## 2018-08-24 ENCOUNTER — Emergency Department (HOSPITAL_BASED_OUTPATIENT_CLINIC_OR_DEPARTMENT_OTHER)
Admission: EM | Admit: 2018-08-24 | Discharge: 2018-08-24 | Disposition: A | Discharge status: Home / Self Care | Payer: PPO | Attending: Emergency Medicine | Admitting: Emergency Medicine

## 2018-08-24 ENCOUNTER — Other Ambulatory Visit: Payer: Self-pay

## 2018-08-24 DIAGNOSIS — I10 Essential (primary) hypertension: Secondary | ICD-10-CM | POA: Insufficient documentation

## 2018-08-24 DIAGNOSIS — S0101XA Laceration without foreign body of scalp, initial encounter: Secondary | ICD-10-CM

## 2018-08-24 DIAGNOSIS — Y92012 Bathroom of single-family (private) house as the place of occurrence of the external cause: Secondary | ICD-10-CM | POA: Diagnosis not present

## 2018-08-24 DIAGNOSIS — Y998 Other external cause status: Secondary | ICD-10-CM | POA: Diagnosis not present

## 2018-08-24 DIAGNOSIS — S199XXA Unspecified injury of neck, initial encounter: Secondary | ICD-10-CM | POA: Diagnosis not present

## 2018-08-24 DIAGNOSIS — Y93E1 Activity, personal bathing and showering: Secondary | ICD-10-CM | POA: Insufficient documentation

## 2018-08-24 DIAGNOSIS — W01198A Fall on same level from slipping, tripping and stumbling with subsequent striking against other object, initial encounter: Secondary | ICD-10-CM | POA: Insufficient documentation

## 2018-08-24 DIAGNOSIS — Z23 Encounter for immunization: Secondary | ICD-10-CM | POA: Insufficient documentation

## 2018-08-24 DIAGNOSIS — M542 Cervicalgia: Secondary | ICD-10-CM | POA: Insufficient documentation

## 2018-08-24 DIAGNOSIS — S0990XA Unspecified injury of head, initial encounter: Secondary | ICD-10-CM | POA: Diagnosis present

## 2018-08-24 DIAGNOSIS — G20 Parkinson's disease: Secondary | ICD-10-CM | POA: Diagnosis not present

## 2018-08-24 MED ORDER — TETANUS-DIPHTH-ACELL PERTUSSIS 5-2.5-18.5 LF-MCG/0.5 IM SUSP
0.5000 mL | Freq: Once | INTRAMUSCULAR | Status: AC
  Administered 2018-08-24: 0.5 mL via INTRAMUSCULAR
  Filled 2018-08-24: qty 0.5

## 2018-08-24 NOTE — ED Triage Notes (Signed)
She lost her balance and fell. She hit the back of her head. Laceration. No LOC. She does not take blood thinners.

## 2018-08-24 NOTE — Discharge Instructions (Addendum)
1. Medications: Tylenol for pain, usual home medications  2. Treatment: ice for swelling, keep wound clean with warm soap and water. Do not submerge in water for 24 hours  3. Follow Up: Please return in 7-10 days to have your stitches/staples removed or sooner if you have concerns. Return to the emergency department for increased redness, drainage of pus from the wound   WOUND CARE  Keep area clean and dry for 24 hours. Do not remove bandage, if applied.  After 24 hours, remove bandage and wash wound gently with mild soap and warm water. Reapply a new bandage after cleaning wound, if directed.   Continue daily cleansing with soap and water until stitches/staples are removed.  Do not apply any ointments or creams to the wound while stitches/staples are in place, as this may cause delayed healing. Return if you experience any of the following signs of infection: Swelling, redness, pus drainage, streaking, fever >101.0 F  Return if you experience excessive bleeding that does not stop after 15-20 minutes of constant, firm pressure.

## 2018-08-24 NOTE — ED Provider Notes (Signed)
[facility] EMERGENCY DEPARTMENT Provider Note   CSN: [id number] Arrival date & time: 08/24/18  2046    History   Chief Complaint Chief Complaint  Patient presents with  . Laceration    HPI Patricia Fitzpatrick is a 73 y.o. female with past medical history of hypertension, OCD, Parkinson's presents emergency department today with chief complaint of head injury. Patient is not anticoagulated.  Two hours prior to arrival patient states she was standing in the bathroom after taking a bath when she slipped and lost her footing.  She fell backwards and hit her head on a hard soap container.  She denies loss of consciousness. She was able to get up with the help of her brother. She has been ambulatory since the fall.  She describes the pain in her head as a discomfort located in the back of her head around the laceration.  The discomfort radiates to her neck.  She rates it 1 out of 10 in severity.  She did not take anything for pain prior to arrival.  She denies fever, chills, visual changes, headache, chest pain, nausea, vomiting, back pain.    Past Medical History:  Diagnosis Date  . Anxiety   . Cataracts, bilateral   . Depression   . High cholesterol   . Hypertension   . OCD (obsessive compulsive disorder)   . Parkinsonism ([place]) 10/14/2015  . Tremor     Patient Active Problem List   Diagnosis Date Noted  . Parkinsonism ([place]) 10/14/2015  . Tremor 10/14/2015  . Memory change 10/14/2015    Past Surgical History:  Procedure Laterality Date  . BREAST BIOPSY  1981  . CATARACT EXTRACTION Right 2017  . EYE SURGERY  1952     OB History   No obstetric history on file.      Home Medications    Prior to Admission medications   Medication Sig Start Date End Date Taking? Authorizing Provider  Acetaminophen (ACETAMIN PO) Take by mouth as needed.     [provider]  alendronate (FOSAMAX) 70 MG tablet TK 1 T PO EVERY WEEK WITH 8 OUNCE OF WATER NEEDS TO REMAIN UPRIGHT  FOR 30 MINUTES AND NO FOOD OR DRINK FOR 30 MINUTES 09/23/16   [provider]  Ascorbic Acid (VITAMIN C PO) Take by mouth. irregularly    [provider]  aspirin 81 MG tablet Take 81 mg by mouth daily.    [provider]  carbidopa-levodopa (SINEMET IR) 25-100 MG tablet Take 1 tablet by mouth 3 (three) times daily. 05/31/18   Kathrynn Ducking, MD  Cholecalciferol (VITAMIN D-3 PO) Take by mouth.    [provider]  Coenzyme Q10 (CO Q 10 PO) Take by mouth.    [provider]  COLLAGEN PO Take by mouth.    [provider]  DOCUSATE SODIUM PO Take by mouth.    [provider]  FLUoxetine (PROZAC) 20 MG capsule 80 mg daily.  01/12/16   [provider]  gabapentin (NEURONTIN) 100 MG capsule TK 1-2 CS PO BID PRN 10/04/17   [provider]  meloxicam (MOBIC) 7.5 MG tablet TK 1 T PO BID WF PRF PAIN 10/20/17   [provider]  Multiple Vitamin (MULTIVITAMIN) tablet Take 1 tablet by mouth daily.    [provider]  pramipexole (MIRAPEX) 0.5 MG tablet TAKE 1 TABLET(0.5 MG) BY MOUTH THREE TIMES DAILY 05/31/18   Kathrynn Ducking, MD    Family History Family History  Problem Relation Age of Onset  . Pulmonary disease Mother   . Alzheimer's disease Mother   . Heart disease Father   . Alzheimer's disease Father     Social History Social History   Tobacco Use  . Smoking status: Never Smoker  . Smokeless tobacco: Never Used  Substance Use Topics  . Alcohol use: Yes    Comment: One glass per year.  . Drug use: No     Allergies   Patient has no known allergies.   Review of Systems Review of Systems  Constitutional: Negative for chills and fever.  HENT: Negative for congestion, ear discharge, ear pain, sinus pressure, sinus pain and sore throat.   Eyes: Negative for pain, redness and visual disturbance.  Respiratory: Negative for cough and shortness of breath.   Cardiovascular: Negative for chest  pain.  Gastrointestinal: Negative for abdominal pain, constipation, diarrhea, nausea and vomiting.  Genitourinary: Negative for dysuria and hematuria.  Musculoskeletal: Positive for neck pain. Negative for back pain.  Skin: Positive for wound.  Neurological: Negative for weakness, numbness and headaches.     Physical Exam Updated Vital Signs BP (!) 153/84   Pulse 74   Temp 98.3 F (36.8 C) (Oral)   Resp 20   Ht 5' 1.5" (1.562 m)   Wt 57.6 kg   SpO2 96%   BMI 23.61 kg/m   Physical Exam Vitals signs and nursing note reviewed.  Constitutional:      General: She is not in acute distress.    Appearance: She is not ill-appearing.  HENT:     Head: Normocephalic. No raccoon eyes or Battle's sign.     Jaw: There is normal jaw occlusion. No pain on movement.      Right Ear: Tympanic membrane and external ear normal. No hemotympanum.     Left Ear: Tympanic membrane and external ear normal. No hemotympanum.     Nose: Nose normal.     Mouth/Throat:     Mouth: Mucous membranes are moist.     Pharynx: Oropharynx is clear.  Eyes:     General: No scleral icterus.       Right eye: No discharge.        Left eye: No discharge.     Extraocular Movements: Extraocular movements intact.     Conjunctiva/sclera: Conjunctivae normal.     Pupils: Pupils are equal, round, and reactive to light.  Neck:     Musculoskeletal: Normal range of motion.     Vascular: No JVD.  Cardiovascular:     Rate and Rhythm: Normal rate and regular rhythm.     Pulses: Normal pulses.          Radial pulses are 2+ on the right side and 2+ on the left side.     Heart sounds: Normal heart sounds.  Pulmonary:     Comments: Lungs clear to auscultation in all fields. Symmetric chest rise. No wheezing, rales, or rhonchi. Abdominal:     Comments: Abdomen is soft, non-distended, and non-tender in all quadrants. No rigidity, no guarding. No peritoneal signs.  Musculoskeletal: Normal range of motion.     Comments: Full  range of motion of the thoracic spine and lumbar spine with flexion, hyperextension, and lateral flexion. No midline tenderness or stepoffs. No tenderness to palpation of the spinous processes of the thoracic spine or lumbar spine. No tenderness to palpation of the paraspinous muscles of lumbar spine.   Skin:    General: Skin is warm and dry.  Capillary Refill: Capillary refill takes less than 2 seconds.  Neurological:     Mental Status: She is oriented to person, place, and time.     GCS: GCS eye subscore is 4. GCS verbal subscore is 5. GCS motor subscore is 6.     Comments: Speech is clear and goal oriented, follows commands CN III-XII intact, no facial droop Normal strength in upper and lower extremities bilaterally including dorsiflexion and plantar flexion, strong and equal grip strength Sensation normal to light and sharp touch Moves extremities without ataxia, coordination intact   Psychiatric:        Behavior: Behavior normal.      ED Treatments / Results  Labs (all labs ordered are listed, but only abnormal results are displayed) Labs Reviewed - No data to display  EKG None  Radiology Ct Head Wo Contrast  Result Date: 08/24/2018 CLINICAL DATA:  Fall at home today getting out of the tub striking head on shelf. Bleeding to back of head. Initial encounter. EXAM: CT HEAD WITHOUT CONTRAST CT CERVICAL SPINE WITHOUT CONTRAST TECHNIQUE: Multidetector CT imaging of the head and cervical spine was performed following the standard protocol without intravenous contrast. Multiplanar CT image reconstructions of the cervical spine were also generated. COMPARISON:  None. FINDINGS: CT HEAD FINDINGS Brain: No intracranial hemorrhage, mass effect, or midline shift. No hydrocephalus. The basilar cisterns are patent. No evidence of territorial infarct or acute ischemia. Generalized atrophy, similar to prior exam. No extra-axial or intracranial fluid collection. Vascular: Atherosclerosis of  skullbase vasculature without hyperdense vessel or abnormal calcification. Skull: No fracture or focal lesion. Sinuses/Orbits: Paranasal sinuses and mastoid air cells are clear. The visualized orbits are unremarkable. Right cataract resection. Other: Small posterior occipital scalp hematoma and laceration. CT CERVICAL SPINE FINDINGS Alignment: Normal. Skull base and vertebrae: No acute fracture. Prominent Schmorl's node within right aspect of C6, partially visualized on prior counting view from thoracic spine MRI. Vertebral body heights are preserved. Dens and skull base are intact. Soft tissues and spinal canal: No prevertebral fluid or swelling, particularly at C6. No visible canal hematoma. Disc levels: Disc space narrowing and endplate spurring, most prominent at C5-C6 and C6-C7. Upper chest: Negative. Other: None. IMPRESSION: 1. Small posterior occipital scalp hematoma and laceration without acute intracranial abnormality. No skull fracture. 2. Degenerative change in the cervical spine without acute fracture. Electronically Signed   By: Keith Rake M.D.   On: 08/24/2018 22:08   Ct Cervical Spine Wo Contrast  Result Date: 08/24/2018 CLINICAL DATA:  Fall at home today getting out of the tub striking head on shelf. Bleeding to back of head. Initial encounter. EXAM: CT HEAD WITHOUT CONTRAST CT CERVICAL SPINE WITHOUT CONTRAST TECHNIQUE: Multidetector CT imaging of the head and cervical spine was performed following the standard protocol without intravenous contrast. Multiplanar CT image reconstructions of the cervical spine were also generated. COMPARISON:  None. FINDINGS: CT HEAD FINDINGS Brain: No intracranial hemorrhage, mass effect, or midline shift. No hydrocephalus. The basilar cisterns are patent. No evidence of territorial infarct or acute ischemia. Generalized atrophy, similar to prior exam. No extra-axial or intracranial fluid collection. Vascular: Atherosclerosis of skullbase vasculature without  hyperdense vessel or abnormal calcification. Skull: No fracture or focal lesion. Sinuses/Orbits: Paranasal sinuses and mastoid air cells are clear. The visualized orbits are unremarkable. Right cataract resection. Other: Small posterior occipital scalp hematoma and laceration. CT CERVICAL SPINE FINDINGS Alignment: Normal. Skull base and vertebrae: No acute fracture. Prominent Schmorl's node within right aspect of C6, partially visualized  on prior counting view from thoracic spine MRI. Vertebral body heights are preserved. Dens and skull base are intact. Soft tissues and spinal canal: No prevertebral fluid or swelling, particularly at C6. No visible canal hematoma. Disc levels: Disc space narrowing and endplate spurring, most prominent at C5-C6 and C6-C7. Upper chest: Negative. Other: None. IMPRESSION: 1. Small posterior occipital scalp hematoma and laceration without acute intracranial abnormality. No skull fracture. 2. Degenerative change in the cervical spine without acute fracture. Electronically Signed   By: Keith Rake M.D.   On: 08/24/2018 22:08    Procedures .Marland KitchenLaceration Repair  Date/Time: 08/25/2018 3:59 PM Performed by: Cherre Robins, PA-C Authorized by: Cherre Robins, PA-C   Consent:    Consent obtained:  Verbal   Consent given by:  Patient   Risks discussed:  Infection, poor wound healing and need for additional repair   Alternatives discussed:  No treatment Universal protocol:    Immediately prior to procedure, a time out was called: yes     Patient identity confirmed:  Verbally with patient and arm band Anesthesia (see MAR for exact dosages):    Anesthesia method:  None Laceration details:    Location:  Scalp   Scalp location:  Occipital   Length (cm):  2 Repair type:    Repair type:  Simple Pre-procedure details:    Preparation:  Patient was prepped and draped in usual sterile fashion and imaging obtained to evaluate for foreign bodies Exploration:     Hemostasis achieved with:  Direct pressure   Wound exploration: wound explored through full range of motion and entire depth of wound probed and visualized     Contaminated: no   Treatment:    Area cleansed with:  Saline   Amount of cleaning:  Standard   Irrigation solution:  Sterile water   Visualized foreign bodies/material removed: no   Skin repair:    Repair method:  Staples   Number of staples:  3 Approximation:    Approximation:  Close Post-procedure details:    Dressing:  Open (no dressing)   (including critical care time)  Medications Ordered in ED Medications  Tdap (BOOSTRIX) injection 0.5 mL (0.5 mLs Intramuscular Given 08/24/18 2149)     Initial Impression / Assessment and Plan / ED Course  I have reviewed the triage vital signs and the nursing notes.  Pertinent labs & imaging results that were available during my care of the patient were reviewed by me and considered in my medical decision making (see chart for details).  Patient presents to the emergency department with 2 cm laceration to occiptal scalp which occurred within 2 hours PTA. Patient nontoxic appearing, resting comfortably.  Neuro exam without focal deficit. CT head and neck are without bleeding. Pressure irrigation performed. Wound explored and base of wound visualized in a bloodless field without evidence of foreign body. Laceration repair per procedure note above, tolerated well. Tetanus updated at today's visit. Do not feel that abx are indicated at this time based on wound appearance and lack of significant comorbidities. Discussed suture home care as well as need for wound recheck and suture removal in 7 days.  I discussed results, treatment plan, need for follow-up, and return precautions with the patient including signs of infection. Provided opportunity for questions, patient confirmed understanding and is in agreement with plan. Findings and plan of care discussed with supervising physician Dr.  Lita Mains.   This note was prepared using Dragon voice recognition software and may include unintentional dictation errors  due to the inherent limitations of voice recognition software.    Final Clinical Impressions(s) / ED Diagnoses   Final diagnoses:  Laceration of scalp, initial encounter    ED Discharge Orders    None       Flint Melter 08/25/18 1601    Julianne Rice, MD 09/04/18 [phone]

## 2018-08-26 ENCOUNTER — Emergency Department (HOSPITAL_BASED_OUTPATIENT_CLINIC_OR_DEPARTMENT_OTHER)
Admission: EM | Admit: 2018-08-26 | Discharge: 2018-08-26 | Disposition: A | Discharge status: Home / Self Care | Payer: PPO | Attending: Emergency Medicine | Admitting: Emergency Medicine

## 2018-08-26 ENCOUNTER — Encounter (HOSPITAL_BASED_OUTPATIENT_CLINIC_OR_DEPARTMENT_OTHER): Payer: Self-pay | Admitting: Emergency Medicine

## 2018-08-26 ENCOUNTER — Emergency Department (HOSPITAL_BASED_OUTPATIENT_CLINIC_OR_DEPARTMENT_OTHER): Payer: PPO

## 2018-08-26 ENCOUNTER — Other Ambulatory Visit: Payer: Self-pay

## 2018-08-26 DIAGNOSIS — M25551 Pain in right hip: Secondary | ICD-10-CM | POA: Diagnosis not present

## 2018-08-26 DIAGNOSIS — W19XXXD Unspecified fall, subsequent encounter: Secondary | ICD-10-CM | POA: Diagnosis not present

## 2018-08-26 DIAGNOSIS — I10 Essential (primary) hypertension: Secondary | ICD-10-CM | POA: Insufficient documentation

## 2018-08-26 DIAGNOSIS — S79911A Unspecified injury of right hip, initial encounter: Secondary | ICD-10-CM | POA: Diagnosis not present

## 2018-08-26 MED ORDER — DICLOFENAC SODIUM 1 % TD GEL: 2.0000 g | Freq: Four times a day (QID) | TRANSDERMAL | 0 refills | Status: DC | PRN

## 2018-08-26 NOTE — ED Provider Notes (Signed)
Emergency Department Provider Note   I have reviewed the triage vital signs and the nursing notes.   HISTORY  Chief Complaint Fall and Hip Pain   HPI Patricia Fitzpatrick is a 73 y.o. female with PMH of Parkinson's, HLD, HTN, and cataracts who presents to the emergency department for evaluation of persistent right hip pain after mechanical fall several days ago.  Patient was seen in the emergency department after a fall at home.  She sustained a head laceration after the fall which was repaired with staples.  She states that since the fall her right hip has been sore and persistently painful.  Worse with movement.  No additional falls.  No pain in the knee.  No numbness or tingling in the leg. Patient had some lower back discomfort but now seems localized in the right hip area.    Past Medical History:  Diagnosis Date   Anxiety    Cataracts, bilateral    Depression    High cholesterol    Hypertension    OCD (obsessive compulsive disorder)    Parkinsonism ([place]) 10/14/2015   Tremor     Patient Active Problem List   Diagnosis Date Noted   Parkinsonism ([place]) 10/14/2015   Tremor 10/14/2015   Memory change 10/14/2015    Past Surgical History:  Procedure Laterality Date   BREAST BIOPSY  1981   CATARACT EXTRACTION Right 2017   EYE SURGERY  1952    Allergies Patient has no known allergies.  Family History  Problem Relation Age of Onset   Pulmonary disease Mother    Alzheimer's disease Mother    Heart disease Father    Alzheimer's disease Father     Social History Social History   Tobacco Use   Smoking status: Never Smoker   Smokeless tobacco: Never Used  Substance Use Topics   Alcohol use: Yes    Comment: One glass per year.   Drug use: No    Review of Systems  Constitutional: No fever/chills Eyes: No visual changes. ENT: No sore throat. Cardiovascular: Denies chest pain. Respiratory: Denies shortness of breath. Gastrointestinal: No  abdominal pain.  No nausea, no vomiting.  No diarrhea.  No constipation. Genitourinary: Negative for dysuria. Musculoskeletal: Negative for back pain. Positive right hip pain.  Skin: Negative for rash. Neurological: Negative for headaches, focal weakness or numbness.  10-point ROS otherwise negative.  ____________________________________________   PHYSICAL EXAM:  VITAL SIGNS: ED Triage Vitals  Enc Vitals Group     BP 08/26/18 0740 (!) 152/99     Pulse Rate 08/26/18 0740 79     Resp 08/26/18 0740 18     Temp 08/26/18 0740 98.2 F (36.8 C)     Temp Source 08/26/18 0740 Oral     SpO2 08/26/18 0740 100 %     Weight 08/26/18 0740 127 lb (57.6 kg)     Height 08/26/18 0740 5\' 1"  (1.549 m)     Pain Score 08/26/18 0741 5   Constitutional: Alert and oriented. Well appearing and in no acute distress. Eyes: Conjunctivae are normal. Head: Atraumatic. Nose: No congestion/rhinnorhea. Mouth/Throat: Mucous membranes are moist.  Neck: No stridor. No cervical spine tenderness to palpation. Cardiovascular: Normal rate, regular rhythm.  Respiratory: Normal respiratory effort. Gastrointestinal: Soft and nontender. No distention.  Musculoskeletal: No lower extremity tenderness nor edema. No gross deformities of extremities. Normal ROM of the right hip and knee.  Neurologic:  Normal speech and language. No gross focal neurologic deficits are appreciated. Baseline intention  tremor noted.  Skin:  Skin is warm, dry and intact. No rash noted.  ____________________________________________  RADIOLOGY  Dg Hip Unilat  With Pelvis 2-3 Views Right  Result Date: 08/26/2018 CLINICAL DATA:  Pt was seen for a fall on Thursday night and now has persistent right hip pain. EXAM: DG HIP (WITH OR WITHOUT PELVIS) 2-3V RIGHT COMPARISON:  None. FINDINGS: No fracture or bone lesion. The hip joints, SI joints and symphysis pubis are normally spaced and aligned. No arthropathic changes. Soft tissues are unremarkable.  IMPRESSION: Negative. Electronically Signed   By: Lajean Manes M.D.   On: 08/26/2018 08:18    ____________________________________________   PROCEDURES  Procedure(s) performed:   Procedures  None  ____________________________________________   INITIAL IMPRESSION / ASSESSMENT AND PLAN / ED COURSE  Pertinent labs & imaging results that were available during my care of the patient were reviewed by me and considered in my medical decision making (see chart for details).   Patient presents to the emergency department for evaluation of right hip pain which has persisted since a fall on 7/9.  Normal range of motion of the joint.  No midline spine tenderness.  No knee discomfort. Plan for x-ray to evaluate for underlying fracture.   08:34 AM  Plain films of the right hip reviewed with no acute findings.  Patient has follow-up appointment with her PCP next week.  Discussed mentioning the symptoms at her appointment and discuss additional imaging/referral if pain is persistent.  Will prescribe topical pain medication.  Patient has a walker at home and I encouraged her to use this at all times until her symptoms improve.  Discussed ED return precautions.  Patient comfortable with plan at discharge and ambulatory here in the ED.  ____________________________________________  FINAL CLINICAL IMPRESSION(S) / ED DIAGNOSES  Final diagnoses:  Right hip pain  Fall, subsequent encounter      NEW OUTPATIENT MEDICATIONS STARTED DURING THIS VISIT:  New Prescriptions   DICLOFENAC SODIUM (VOLTAREN) 1 % GEL    Apply 2 g topically 4 (four) times daily as needed.    Note:  This document was prepared using Dragon voice recognition software and may include unintentional dictation errors.  Nanda Quinton, MD Emergency Medicine    Moyinoluwa Dawe, [facility], MD 08/26/18 [phone]

## 2018-08-26 NOTE — ED Triage Notes (Signed)
Pt was seen for a fall on Thursday night and now has persistent right hip pain.

## 2018-08-26 NOTE — Discharge Instructions (Signed)
You were seen in the emergency department today with persistent right hip pain after a fall recently.  I would advise that you use a walker at home until your pain symptoms resolve.  Please use the topical pain medication as prescribed.  Follow-up with your primary care physician next week for suture removal.  Also discussed with them your hip pain symptoms.  If symptoms continue, you may need additional imaging or referral which can be performed by your PCP.   Return to the emergency department with any additional falls, head injury, weakness/numbness symptoms, chest pain, or other sudden/severe symptoms.

## 2018-08-30 ENCOUNTER — Telehealth: Payer: Self-pay | Admitting: Neurology

## 2018-08-30 NOTE — Telephone Encounter (Signed)
I called the patient.  The patient fell on 24 August 2018.  She did hit her head, she fell in the bathroom.  She has been set up for physical therapy, but this has not yet been started yet.  The patient will be seen in the office next week, she does report some numbness in the feet that is been present for some time but may have worsened since the fall.  The patient is on Sinemet.  She reports some back pain and right leg pain, she did get x-rays in the hospital.  No fractures were noted.

## 2018-08-30 NOTE — Telephone Encounter (Signed)
Pt called in stating she had a bad fall on 08/24/2018, she states she ended up with a sore hip and a sore lower back and staples in the back of her head. She states she doesn't have any fractures. But she has been experiencing some numbness in her feet.

## 2018-09-01 ENCOUNTER — Emergency Department (HOSPITAL_COMMUNITY): Payer: PPO

## 2018-09-01 ENCOUNTER — Encounter (HOSPITAL_COMMUNITY): Payer: Self-pay

## 2018-09-01 ENCOUNTER — Emergency Department (HOSPITAL_COMMUNITY)
Admission: EM | Admit: 2018-09-01 | Discharge: 2018-09-01 | Disposition: A | Discharge status: Home / Self Care | Payer: PPO | Attending: Emergency Medicine | Admitting: Emergency Medicine

## 2018-09-01 DIAGNOSIS — Y999 Unspecified external cause status: Secondary | ICD-10-CM | POA: Insufficient documentation

## 2018-09-01 DIAGNOSIS — Z79899 Other long term (current) drug therapy: Secondary | ICD-10-CM | POA: Diagnosis not present

## 2018-09-01 DIAGNOSIS — W19XXXA Unspecified fall, initial encounter: Secondary | ICD-10-CM | POA: Insufficient documentation

## 2018-09-01 DIAGNOSIS — M25551 Pain in right hip: Secondary | ICD-10-CM | POA: Diagnosis present

## 2018-09-01 DIAGNOSIS — Y939 Activity, unspecified: Secondary | ICD-10-CM | POA: Insufficient documentation

## 2018-09-01 DIAGNOSIS — M47816 Spondylosis without myelopathy or radiculopathy, lumbar region: Secondary | ICD-10-CM

## 2018-09-01 DIAGNOSIS — I10 Essential (primary) hypertension: Secondary | ICD-10-CM | POA: Diagnosis not present

## 2018-09-01 DIAGNOSIS — Y929 Unspecified place or not applicable: Secondary | ICD-10-CM | POA: Diagnosis not present

## 2018-09-01 DIAGNOSIS — M545 Low back pain: Secondary | ICD-10-CM | POA: Insufficient documentation

## 2018-09-01 DIAGNOSIS — M25552 Pain in left hip: Secondary | ICD-10-CM | POA: Insufficient documentation

## 2018-09-01 DIAGNOSIS — M4854XD Collapsed vertebra, not elsewhere classified, thoracic region, subsequent encounter for fracture with routine healing: Secondary | ICD-10-CM | POA: Diagnosis not present

## 2018-09-01 DIAGNOSIS — M438X4 Other specified deforming dorsopathies, thoracic region: Secondary | ICD-10-CM | POA: Diagnosis not present

## 2018-09-01 DIAGNOSIS — M4306 Spondylolysis, lumbar region: Secondary | ICD-10-CM | POA: Diagnosis not present

## 2018-09-01 DIAGNOSIS — R52 Pain, unspecified: Secondary | ICD-10-CM | POA: Diagnosis not present

## 2018-09-01 DIAGNOSIS — M47814 Spondylosis without myelopathy or radiculopathy, thoracic region: Secondary | ICD-10-CM | POA: Diagnosis not present

## 2018-09-01 DIAGNOSIS — G20 Parkinson's disease: Secondary | ICD-10-CM | POA: Diagnosis not present

## 2018-09-01 DIAGNOSIS — M4606 Spinal enthesopathy, lumbar region: Secondary | ICD-10-CM | POA: Diagnosis not present

## 2018-09-01 MED ORDER — TRAMADOL-ACETAMINOPHEN 37.5-325 MG PO TABS: 1.0000 | ORAL_TABLET | Freq: Four times a day (QID) | ORAL | 0 refills | Status: DC | PRN

## 2018-09-01 NOTE — ED Triage Notes (Signed)
Per EMS- Patient reports that she fell 8 days ago and states that she is having increased pain to her back and left hip.  Patient also reports that she is suppose to have her sutures removed from her head today, but did not go to [facility] because she wants an MRI done.  EMS states left leg shorter than right.

## 2018-09-01 NOTE — ED Notes (Signed)
Patient states both her hips hurt and right/lower back hurts.    1/10 pain while laying  8/10 pain if walking.

## 2018-09-01 NOTE — ED Provider Notes (Signed)
[facility] DEPT Provider Note   CSN: [id number] Arrival date & time: 09/01/18  1302    History   Chief Complaint Chief Complaint  Patient presents with  . Fall  . Hip Pain    HPI Patricia Fitzpatrick is a 73 y.o. female.     HPI  She presents for evaluation of persistent pain in both hips, and low back, since evaluated for a fall, 1 week ago.  No additional injuries.  She has progressive symptoms, initially right hip, then left hip now low back.  He denies bowel or bladder incontinence.  His prior compression fractures of her lumbar and thoracic spines.  She denies fever, chills, focal weakness or paresthesia.  Today she was able to walk, but had to do it slowly with "baby steps."  She is taking Tylenol regularly for pain without relief.  There are no other known modifying factors.   Past Medical History:  Diagnosis Date  . Anxiety   . Cataracts, bilateral   . Depression   . High cholesterol   . Hypertension   . OCD (obsessive compulsive disorder)   . 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 Tremor     Patient Active Problem List   Diagnosis Date Noted  . Parkinsonism ([place]) 10/14/2015  . Tremor 10/14/2015  . Memory change 10/14/2015    Past Surgical History:  Procedure Laterality Date  . BREAST BIOPSY  1981  . CATARACT EXTRACTION Right 2017  . EYE SURGERY  1952     OB History   No obstetric history on file.      Home Medications    Prior to Admission medications   Medication Sig Start Date End Date Taking? Authorizing Provider  Acetaminophen (ACETAMIN PO) Take 500 mg by mouth every 6 (six) hours as needed (pain).    Yes [provider]  alendronate (FOSAMAX) 70 MG tablet Take 70 mg by mouth once a week. With 8 ounces of water needs to remain upright for 30 minutes and no food or drink for 30 minutes 09/23/16  Yes [provider]  Ascorbic Acid (VITAMIN C PO) Take 100 mg by mouth 2 (two) times a week.    Yes [provider]  carbidopa-levodopa (SINEMET IR) 25-100 MG tablet Take 1 tablet by mouth 3 (three) times daily. 05/31/18  Yes Kathrynn Ducking, MD  Cholecalciferol (VITAMIN D-3 PO) Take 1,000 Units by mouth daily.    Yes [provider]  diclofenac sodium (VOLTAREN) 1 % GEL Apply 2 g topically 4 (four) times daily as needed. Patient taking differently: Apply 2 g topically 4 (four) times daily as needed (pain).  08/26/18  Yes Long, Wonda Olds, MD  FLUoxetine (PROZAC) 20 MG capsule Take 80 mg by mouth daily.  01/12/16  Yes [provider]  meloxicam (MOBIC) 7.5 MG tablet Take 7.5 mg by mouth daily.  10/20/17  Yes [provider]  Multiple Vitamin (MULTIVITAMIN) tablet Take 1 tablet by mouth daily.   Yes [provider]  pramipexole (MIRAPEX) 0.5 MG tablet TAKE 1 TABLET(0.5 MG) BY MOUTH THREE TIMES DAILY Patient taking differently: Take 0.25 mg by mouth 3 (three) times daily.  05/31/18  Yes Kathrynn Ducking, MD  gabapentin (NEURONTIN) 100 MG capsule Take 100-200 mg by mouth 2 (two) times daily as needed (nerve pain).  10/04/17   [provider]  traMADol-acetaminophen (ULTRACET) 37.5-325 MG tablet Take 1 tablet by mouth every 6 (six) hours as needed. 09/01/18   Daleen Bo,  MD    Family History Family History  Problem Relation Age of Onset  . Pulmonary disease Mother   . Alzheimer's disease Mother   . Heart disease Father   . Alzheimer's disease Father     Social History Social History   Tobacco Use  . Smoking status: Never Smoker  . Smokeless tobacco: Never Used  Substance Use Topics  . Alcohol use: Yes    Comment: One glass per year.  . Drug use: No     Allergies   Patient has no known allergies.   Review of Systems Review of Systems  All other systems reviewed and are negative.    Physical Exam Updated Vital Signs BP (!) 152/95   Pulse 83   Temp 98.1 F (36.7 C) (Oral)   Resp 17   SpO2 93%   Physical Exam Vitals signs and  nursing note reviewed.  Constitutional:      General: She is not in acute distress.    Appearance: She is well-developed. She is not ill-appearing, toxic-appearing or diaphoretic.     Comments: Frail, elderly  HENT:     Head: Normocephalic and atraumatic.     Right Ear: External ear normal.     Left Ear: External ear normal.  Eyes:     Conjunctiva/sclera: Conjunctivae normal.     Pupils: Pupils are equal, round, and reactive to light.  Neck:     Musculoskeletal: Normal range of motion and neck supple.     Trachea: Phonation normal.  Cardiovascular:     Rate and Rhythm: Normal rate and regular rhythm.     Heart sounds: Normal heart sounds.  Pulmonary:     Effort: Pulmonary effort is normal.     Breath sounds: Normal breath sounds.  Abdominal:     Palpations: Abdomen is soft.     Tenderness: There is no abdominal tenderness.  Musculoskeletal: Normal range of motion.     Comments: Thoracic kyphosis present.  No palpable tenderness of the thoracic or lumbar spine region.  Normal range of motion neck.  Skin:    General: Skin is warm and dry.  Neurological:     Mental Status: She is alert and oriented to person, place, and time.     Cranial Nerves: No cranial nerve deficit.     Sensory: No sensory deficit.     Motor: No abnormal muscle tone.     Coordination: Coordination normal.  Psychiatric:        Mood and Affect: Mood normal.        Behavior: Behavior normal.        Thought Content: Thought content normal.        Judgment: Judgment normal.      ED Treatments / Results  Labs (all labs ordered are listed, but only abnormal results are displayed) Labs Reviewed - No data to display  EKG None  Radiology Dg Lumbar Spine Complete  Result Date: 09/01/2018 CLINICAL DATA:  Increasing low back and left hip pain following a fall 8 days ago. EXAM: LUMBAR SPINE - COMPLETE 4+ VIEW COMPARISON:  Lumbar spine MR dated 08/17/2016. FINDINGS: Cough 5 non-rib-bearing lumbar vertebrae.  Minimal anterior spur formation at the L3-4 and L4-5 levels. T10-11 acute kyphosis and T11 and T12 compression deformities, not adequately visualized due to overexposure on the lateral view. These are better visualized on the previous MR, without gross change. Lower thoracic spine degenerative change. Mild facet degenerative changes throughout the lumbar spine. No pars defects or subluxations. IMPRESSION:  Minimal lumbar spine degenerative changes and old T11 and T12 compression fractures with acute kyphosis, as described above. Electronically Signed   By: Claudie Revering M.D.   On: 09/01/2018 15:13    Procedures Procedures (including critical care time)  Medications Ordered in ED Medications - No data to display   Initial Impression / Assessment and Plan / ED Course  I have reviewed the triage vital signs and the nursing notes.  Pertinent labs & imaging results that were available during my care of the patient were reviewed by me and considered in my medical decision making (see chart for details).  Clinical Course as of Aug 31 1553  Fri Sep 01, 2018  1547 Pre-existing compression fractures, unchanged, with generalized degenerative changes.  Images reviewed by me  DG Lumbar Spine Complete [EW]    Clinical Course User Index [EW] Daleen Bo, MD        Patient Vitals for the past 24 hrs:  BP Temp Temp src Pulse Resp SpO2  09/01/18 1348 (!) 152/95 - - 83 17 93 %  09/01/18 1345 - - - - - 92 %  09/01/18 1313 (!) 162/82 98.1 F (36.7 C) Oral 86 18 93 %  09/01/18 1311 - - - - - 98 %    Imaging ordered to evaluate for lumbar spine compression fracture  Medical Decision Making: Back and leg pain related to discharge changes in the lumbar spine.  Doubt pelvic or hip fracture.  Doubt lumbar myelopathy.  CRITICAL CARE-no Performed by: Daleen Bo   Nursing Notes Reviewed/ Care Coordinated Applicable Imaging Reviewed Interpretation of Laboratory Data incorporated into ED treatment   The patient appears reasonably screened and/or stabilized for discharge and I doubt any other medical condition or other [facility] requiring further screening, evaluation, or treatment in the ED at this time prior to discharge.  Plan: Home Medications-continue usual; Home Treatments-heat to affected area and gradually advance activity; return here if the recommended treatment, does not improve the symptoms; Recommended follow up-PCP, PRN    Final Clinical Impressions(s) / ED Diagnoses   Final diagnoses:  Spondylosis of lumbar region without myelopathy or radiculopathy    ED Discharge Orders         Ordered    traMADol-acetaminophen (ULTRACET) 37.5-325 MG tablet  Every 6 hours PRN     09/01/18 1553           Daleen Bo, MD 09/01/18 1555

## 2018-09-01 NOTE — Discharge Instructions (Signed)
The pain in your back and hips is likely from arthritis in your lower back.  We sent a prescription for Ultracet to your pharmacy.  Use this instead of Tylenol, 2-4 times a day for pain.  However if you do not need this amount of pain reliever, you can use Tylenol instead.

## 2018-09-01 NOTE — ED Notes (Signed)
Patient told her brother that she was at [facility] and now waiting for brother to come to [facility] so she can be discharged.

## 2018-09-04 ENCOUNTER — Telehealth: Payer: Self-pay | Admitting: Neurology

## 2018-09-04 MED ORDER — METHOCARBAMOL 500 MG PO TABS: 500.0000 mg | ORAL_TABLET | Freq: Three times a day (TID) | ORAL | 1 refills | Status: DC

## 2018-09-04 NOTE — Telephone Encounter (Signed)
Pt states that she had a terrible fall last week and ever since then she has been experiencing numbness and pain. Pt would like provider to call her to discuss. Please advise.

## 2018-09-04 NOTE — Telephone Encounter (Signed)
I called the patient.  The patient is having a lot of low back pain and hip pain, she had x-rays of the hips and low back, no acute fractures were seen, she has old lower thoracic compression fractures.  The patient apparently refused the physical therapy when they call to set it up.  We may have to get her back into physical therapy.  She is now walking with a walker.  I will send in a prescription for Robaxin, I will see her in 2 days.

## 2018-09-05 ENCOUNTER — Telehealth: Payer: Self-pay

## 2018-09-05 ENCOUNTER — Telehealth: Payer: Self-pay | Admitting: Neurology

## 2018-09-05 NOTE — Telephone Encounter (Signed)
The patient called again today, just started Robaxin late yesterday, I will see her in the office tomorrow, we will discuss pain management then.

## 2018-09-05 NOTE — Telephone Encounter (Signed)
Pt states that none of the medications are touching the pain that she is going through especially when she walks. Please advise.

## 2018-09-05 NOTE — Telephone Encounter (Signed)
PA for Methocarbamol has been submitted via cover my meds. (Key: [id number])  [employer] has received your information, and the request will be reviewed. You may close this dialog, return to your dashboard, and perform other tasks.  You will receive an electronic determination in CoverMyMeds. You can see the latest determination by locating this request on your dashboard or by reopening this request. You will also receive a faxed copy of the determination. If you have any questions please contact [employer] at [phone].  If you need assistance, please chat with CoverMyMeds or call us at [phone].

## 2018-09-05 NOTE — Telephone Encounter (Signed)
PA for methocarbamol has been been approved through 09/05/2019.

## 2018-09-06 ENCOUNTER — Encounter: Payer: Self-pay | Admitting: Neurology

## 2018-09-06 ENCOUNTER — Ambulatory Visit (INDEPENDENT_AMBULATORY_CARE_PROVIDER_SITE_OTHER): Payer: PPO | Admitting: Neurology

## 2018-09-06 ENCOUNTER — Other Ambulatory Visit: Payer: Self-pay

## 2018-09-06 VITALS — BP 149/70 | HR 65 | Temp 98.2°F | Wt 125.0 lb

## 2018-09-06 DIAGNOSIS — M25552 Pain in left hip: Secondary | ICD-10-CM | POA: Diagnosis not present

## 2018-09-06 DIAGNOSIS — M545 Low back pain: Secondary | ICD-10-CM | POA: Diagnosis not present

## 2018-09-06 DIAGNOSIS — G20 Parkinson's disease: Secondary | ICD-10-CM | POA: Diagnosis not present

## 2018-09-06 DIAGNOSIS — M25551 Pain in right hip: Secondary | ICD-10-CM | POA: Diagnosis not present

## 2018-09-06 MED ORDER — PREDNISONE 5 MG PO TABS: ORAL_TABLET | ORAL | 0 refills | Status: DC

## 2018-09-06 MED ORDER — HYDROCODONE-ACETAMINOPHEN 5-325 MG PO TABS: 1.0000 | ORAL_TABLET | Freq: Four times a day (QID) | ORAL | 0 refills | Status: DC | PRN

## 2018-09-06 NOTE — Progress Notes (Signed)
Reason for visit: Parkinson's disease  Patricia Fitzpatrick is an 73 y.o. female  History of present illness:  Patricia Fitzpatrick is a 73 year old right-handed white female with a history of Parkinson's disease.  She was recently increased on her Sinemet to a full 25/100 mg tablet 3 times daily in April 2020.  The patient unfortunately had a fall on 24 August 2018 while in the bathroom, she fell backwards and hit her head, she went to the emergency room and got stitches on her scalp, she had a CT of the head and of the cervical spine that did not show fractures or intracranial trauma.  Within a couple days, she developed significant bilateral hip discomfort, she went back and had x-rays of the low back and of the hips, no fractures were noted.  The pain has continued.  The patient has more pain when she is up on her feet but she still has some discomfort even while sitting, particularly if she is sitting a long time.  She has not had any further falls, she is using a walker for ambulation currently.  She is able to sleep.  She denies any neck pain.  She has some occasional numbness in the feet.  She was seen today by Dr. Alfonso Ramus, and he gave her injections in the hips on both sides.  She is on Ultracet for pain but this is not helpful.  She has low-dose gabapentin, and she was recently placed on methocarbamol.  She returns to this office today.  Past Medical History:  Diagnosis Date  . Anxiety   . Cataracts, bilateral   . Depression   . High cholesterol   . Hypertension   . OCD (obsessive compulsive disorder)   . 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 Tremor     Past Surgical History:  Procedure Laterality Date  . BREAST BIOPSY  1981  . CATARACT EXTRACTION Right 2017  . EYE SURGERY  1952    Family History  Problem Relation Age of Onset  . Pulmonary disease Mother   . Alzheimer's disease Mother   . Heart disease Father   . Alzheimer's disease Father     Social history:  reports that she has never smoked.  She has never used smokeless tobacco. She reports current alcohol use. She reports that she does not use drugs.   No Known Allergies  Medications:  Prior to Admission medications   Medication Sig Start Date End Date Taking? Authorizing Provider  alendronate (FOSAMAX) 70 MG tablet Take 70 mg by mouth once a week. With 8 ounces of water needs to remain upright for 30 minutes and no food or drink for 30 minutes 09/23/16  Yes [provider]  Ascorbic Acid (VITAMIN C PO) Take 100 mg by mouth 2 (two) times a week.    Yes [provider]  carbidopa-levodopa (SINEMET IR) 25-100 MG tablet Take 1 tablet by mouth 3 (three) times daily. 05/31/18  Yes Kathrynn Ducking, MD  Cholecalciferol (VITAMIN D-3 PO) Take 1,000 Units by mouth daily.    Yes [provider]  diclofenac sodium (VOLTAREN) 1 % GEL Apply 2 g topically 4 (four) times daily as needed. Patient taking differently: Apply 2 g topically 4 (four) times daily as needed (pain).  08/26/18  Yes Long, Wonda Olds, MD  FLUoxetine (PROZAC) 20 MG capsule Take 80 mg by mouth daily.  01/12/16  Yes [provider]  gabapentin (NEURONTIN) 100 MG capsule Take 100-200 mg by mouth 2 (two) times  daily as needed (nerve pain).  10/04/17  Yes [provider]  meloxicam (MOBIC) 7.5 MG tablet Take 7.5 mg by mouth daily.  10/20/17  Yes [provider]  methocarbamol (ROBAXIN) 500 MG tablet Take 1 tablet (500 mg total) by mouth 3 (three) times daily. 09/04/18  Yes Kathrynn Ducking, MD  Multiple Vitamin (MULTIVITAMIN) tablet Take 1 tablet by mouth daily.   Yes [provider]  pramipexole (MIRAPEX) 0.5 MG tablet TAKE 1 TABLET(0.5 MG) BY MOUTH THREE TIMES DAILY Patient taking differently: Take 0.25 mg by mouth 3 (three) times daily.  05/31/18  Yes Kathrynn Ducking, MD  traMADol-acetaminophen (ULTRACET) 37.5-325 MG tablet Take 1 tablet by mouth every 6 (six) hours as needed. 09/01/18  Yes Daleen Bo, MD    ROS:   Out of a complete 14 system review of symptoms, the patient complains only of the following symptoms, and all other reviewed systems are negative.  Walking problems Hip pain bilaterally   Blood pressure (!) 149/70, pulse 65, temperature 98.2 F (36.8 C), weight 125 lb (56.7 kg).  Physical Exam  General: The patient is alert and cooperative at the time of the examination.  Neuromuscular: With internal and external rotation of the hips, no significant pain was elicited.  Skin: No significant peripheral edema is noted.   Neurologic Exam  Mental status: The patient is alert and oriented x 3 at the time of the examination. The patient has apparent normal recent and remote memory, with an apparently normal attention span and concentration ability.   Cranial nerves: Facial symmetry is present. Speech is normal, no aphasia or dysarthria is noted. Extraocular movements are full. Visual fields are full.  Masking of the face is seen.  Motor: The patient has good strength in all 4 extremities.  Sensory examination: Soft touch sensation is symmetric on the face, arms, and legs.  Coordination: The patient has good finger-nose-finger and heel-to-shin bilaterally.  Gait and station: The patient is able to ambulate short distance with the examiner, she takes short shuffling steps.  Romberg is negative.  Reflexes: Deep tendon reflexes are symmetric, but appear to be somewhat brisk throughout.   Assessment/Plan:  1.  Parkinson's disease  2.  Gait disturbance  3.  Recent fall, bilateral hip pain  The x-rays have not shown evidence of fracture.  The patient claims of the Ultracet is not effective, we will switch her to low-dose hydrocodone.  She will continue the methocarbamol.  Physical therapy has contacted her, she hopefully will get started with this in the near future.  We will see her back in 3 months, we may have to make an adjustment in her Sinemet dosing at that time.  A prescription  was given for a 6-day prednisone Dosepak.  Jill Alexanders MD 09/06/2018 3:57 PM  [facility] [address] [place], [place] [address]  Phone [phone] Fax [phone]

## 2018-09-07 ENCOUNTER — Other Ambulatory Visit: Payer: Self-pay

## 2018-09-07 ENCOUNTER — Telehealth: Payer: Self-pay | Admitting: Neurology

## 2018-09-07 NOTE — Telephone Encounter (Signed)
Noted  

## 2018-09-07 NOTE — Patient Outreach (Signed)
CMA received a call from a HTA Conceire regarding a patient that was recently discarded from the ED for a fall.   Patient states she has numbness and shacking in her feet and legs. Patient is complaining about pain in lower hips and back, but states X-Ray showed no fracture or break.    She states she is in need for resources such as in home care as she is afraid she may fall again.  CMA referred patient to [facility] mailbox: [email or web address] for follow up.   [place] Management Assistant

## 2018-09-07 NOTE — Telephone Encounter (Signed)
FYI-Pt called to inform provider that her therapy will be beginning on 09/09/18.

## 2018-09-12 ENCOUNTER — Telehealth: Payer: Self-pay | Admitting: Neurology

## 2018-09-12 NOTE — Telephone Encounter (Signed)
I tried to call the patient, the line is busy, I will try to call back later.

## 2018-09-12 NOTE — Telephone Encounter (Signed)
Pt called in and stated if she isnt better in a few days can she go to rehab for a couple of weeks

## 2018-09-12 NOTE — Telephone Encounter (Signed)
I called the patient.  The patient is on hydrocodone for pain without full benefit, it turns out that she refused over the weekend to start physical therapy, indicated that physical therapy is important, if she goes through physical therapy and the pain does not improve, MRI of the low back will need to be done.  I have urged her to initiate physical therapy.

## 2018-09-12 NOTE — Telephone Encounter (Signed)
Pt called in and stated the HYDROcodone-acetaminophen (NORCO/VICODIN) 5-325 MG tablet isnt touching her pain , she states she can barely take care of herself, she states she needs some relief and some help , standing is worse she gets pain in her tailbone and in the middle of her back

## 2018-09-16 ENCOUNTER — Telehealth: Payer: Self-pay | Admitting: Neurology

## 2018-09-16 NOTE — Telephone Encounter (Signed)
Patient would like more hydrocodone. Please call. thanks.

## 2018-09-18 ENCOUNTER — Telehealth: Payer: Self-pay | Admitting: Neurology

## 2018-09-18 MED ORDER — HYDROCODONE-ACETAMINOPHEN 5-325 MG PO TABS: 1.0000 | ORAL_TABLET | Freq: Four times a day (QID) | ORAL | 0 refills | Status: DC | PRN

## 2018-09-18 NOTE — Telephone Encounter (Signed)
[place] drug registry was verified. Last rx was written on 09/06/18 # 30 provided by Dr. Jannifer Franklin.

## 2018-09-18 NOTE — Telephone Encounter (Signed)
Pt called needing a refill on her HYDROcodone-acetaminophen (NORCO/VICODIN) 5-325 MG tablet sent to [facility] in [place]

## 2018-09-18 NOTE — Addendum Note (Signed)
Addended by: Kathrynn Ducking on: 09/18/2018 04:45 PM   Modules accepted: Orders

## 2018-09-18 NOTE — Telephone Encounter (Signed)
I will send in 1 more refill for the hydrocodone.  This is for acute pain only.

## 2018-09-18 NOTE — Telephone Encounter (Signed)
This request was sent to Dr. Jannifer Franklin this am. Waiting on him to review.

## 2018-09-20 ENCOUNTER — Telehealth: Payer: Self-pay | Admitting: Neurology

## 2018-09-20 NOTE — Telephone Encounter (Signed)
I called the pt back and LVM (ok per DPR) advising Dr. Jannifer Franklin sent the Hydrocodone-Acet refill on 8/3 and this was confirmed by pharmacy electronically. Advised it was sent to [facility] [phone]. I provided their phone number and suggested pt call them to check on the status. Asked for cb if needed.

## 2018-09-20 NOTE — Telephone Encounter (Signed)
Pt has called to inform she believes her recovery from her fall would go much more pleasant if she could get a refill on her HYDROcodone-acetaminophen (NORCO/VICODIN) 5-325 MG tablet  [facility] [phone]

## 2018-09-22 ENCOUNTER — Other Ambulatory Visit: Payer: Self-pay | Admitting: Neurology

## 2018-09-24 ENCOUNTER — Telehealth: Payer: Self-pay | Admitting: Neurology

## 2018-09-24 DIAGNOSIS — S39012D Strain of muscle, fascia and tendon of lower back, subsequent encounter: Secondary | ICD-10-CM

## 2018-09-24 NOTE — Telephone Encounter (Signed)
I called the patient.  The patient apparently is still having ongoing pain, I left a message, the patient is to contact me if she requires assistance.   The patient eventually call me back.  Apparently physical therapy was set up through her primary care physician, when the therapist came out to the house, her house was an unusually poor shape, they did not continue physical therapy but initiated Adult Protective Services.  She apparently has gotten a visit from [employer], and physical therapy has not yet occurred.  She is still having some discomfort.  My plans were to have her take her muscle relaxants and get physical therapy and if this did not benefit her pain, I will get an MRI later.  I will get physical therapy to be set up through a facility, not at her home.  X-rays have not shown fractures.

## 2018-09-26 ENCOUNTER — Telehealth: Payer: Self-pay | Admitting: Neurology

## 2018-09-26 NOTE — Telephone Encounter (Signed)
FYI Pt has called to inform Dr Jannifer Franklin that someone in her home came in contact with someone who has Covid-19.  Pt states she called [facility] to make them aware  And she was advised not to show up tomorrow. This is FYI, no call back requested

## 2018-09-26 NOTE — Telephone Encounter (Signed)
If Patricia Fitzpatrick has had close contact with her brother, then she should wait till the results come back, if not, she can go ahead and start rehab.

## 2018-09-26 NOTE — Telephone Encounter (Signed)
Pt has called back to inform that she called [facility] back and asked if she can come in before her brother gets his results back.  Pt said she was told by [facility] for pt to call and ask Dr Jannifer Franklin if she should get tested now or wait until her brother gets his results

## 2018-09-27 ENCOUNTER — Ambulatory Visit: Payer: PPO | Admitting: Physical Therapy

## 2018-09-27 NOTE — Telephone Encounter (Signed)
Pt notified of MD's response and verbalized understanding.

## 2018-09-28 ENCOUNTER — Telehealth: Payer: Self-pay | Admitting: Neurology

## 2018-09-28 NOTE — Telephone Encounter (Signed)
Pt states re: her back and the fall she had months ago she'd like to know if Dr Jannifer Franklin would authorize another refill on her   methocarbamol    And   HYDROcodone-acetaminophen (NORCO/VICODIN) 5-325 MG tablet please call, pt aware Dr Jannifer Franklin is not in the office today

## 2018-09-28 NOTE — Telephone Encounter (Signed)
I contacted the pt. I advised Dr. Jannifer Franklin called the Methocarbamol on 09/25/18 and Hydrocodone was last refilled on 09/18/2018. Pt was advised it was too early for this refill. Pt was advised we could look at refilling next week.

## 2018-09-29 DIAGNOSIS — I10 Essential (primary) hypertension: Secondary | ICD-10-CM | POA: Diagnosis not present

## 2018-09-29 DIAGNOSIS — M25559 Pain in unspecified hip: Secondary | ICD-10-CM | POA: Diagnosis not present

## 2018-09-29 DIAGNOSIS — G20 Parkinson's disease: Secondary | ICD-10-CM | POA: Diagnosis not present

## 2018-09-29 DIAGNOSIS — M25551 Pain in right hip: Secondary | ICD-10-CM | POA: Diagnosis not present

## 2018-09-29 DIAGNOSIS — Z9181 History of falling: Secondary | ICD-10-CM | POA: Diagnosis not present

## 2018-10-04 ENCOUNTER — Encounter

## 2018-10-04 DIAGNOSIS — M25551 Pain in right hip: Secondary | ICD-10-CM | POA: Diagnosis not present

## 2018-10-04 DIAGNOSIS — M545 Low back pain: Secondary | ICD-10-CM | POA: Diagnosis not present

## 2018-10-09 DIAGNOSIS — M545 Low back pain: Secondary | ICD-10-CM | POA: Diagnosis not present

## 2018-10-09 DIAGNOSIS — M25551 Pain in right hip: Secondary | ICD-10-CM | POA: Diagnosis not present

## 2018-10-11 DIAGNOSIS — S3210XA Unspecified fracture of sacrum, initial encounter for closed fracture: Secondary | ICD-10-CM | POA: Diagnosis not present

## 2018-10-18 ENCOUNTER — Ambulatory Visit: Payer: PPO | Admitting: Occupational Therapy

## 2018-10-18 ENCOUNTER — Ambulatory Visit: Payer: PPO | Admitting: Physical Therapy

## 2018-11-06 ENCOUNTER — Other Ambulatory Visit: Payer: Self-pay

## 2018-11-06 NOTE — Patient Outreach (Signed)
[facility]) Care Management  11/06/2018  Patricia Fitzpatrick 31-Aug-1945 [id number]   [facility] referral received from [facility] on 11/06/18.  Referral stated that patient is seeking assistance with locating personal care services.  Per referral, patient is aware that services are not covered by health plan. Successful outreach to patient today.  Explained reason for referral and social work/care management services.  Patient stated that she would like to be "more organized" before discussing options for pcs.  Asked for a call back "early next week."  Ronn Melena, [place] Worker [phone]

## 2018-11-08 ENCOUNTER — Telehealth: Payer: Self-pay | Admitting: Neurology

## 2018-11-08 NOTE — Telephone Encounter (Signed)
FYI-Pt called to inform her provider that she has been doing Physical Therapy at home that is being provided through her Medicare. She has another appt this afternoon and she has been doing it for about 2 weeks. Pt thinks she is doing real well.

## 2018-11-13 ENCOUNTER — Other Ambulatory Visit: Payer: Self-pay

## 2018-11-13 NOTE — Patient Outreach (Signed)
[facility]) Care Management  11/13/2018  Patricia Fitzpatrick 08-29-1945 [id number]   P[facility]ma referral received from [facility] on 11/06/18.  Referral stated that patient is seeking assistance with locating personal care services.  Per referral, patient is aware that services are not covered by health plan. Successful outreach to patient on 11/06/18.  Explained reason for referral and social work/care management services.  Patient stated that she would like to be "more organized" before discussing options for pcs and asked for a call back.  Called patient again today to further discuss referral.  Patient stated that, at this time, she is more interested in a service that would assist with "declutteirng" and deep cleaning her home.  Informed patient that light housekeeping is often part of in-home aide services but a cleaning service is more appropriate if she is not needing assistance with ADL's.  She stated that she might need that kind of service at some point.  Offered to mail list of in-home agencies in the area but she declined.   Closing social work referral.     Ronn Melena, [employer] Social Worker [phone]

## 2018-11-14 ENCOUNTER — Ambulatory Visit: Payer: PPO | Admitting: Neurology

## 2018-12-13 ENCOUNTER — Ambulatory Visit: Payer: PPO | Admitting: Neurology

## 2018-12-13 ENCOUNTER — Encounter: Payer: Self-pay | Admitting: Neurology

## 2018-12-13 ENCOUNTER — Other Ambulatory Visit: Payer: Self-pay

## 2018-12-13 VITALS — BP 133/95 | HR 92 | Temp 97.5°F | Ht 61.0 in | Wt 119.0 lb

## 2018-12-13 DIAGNOSIS — G20 Parkinson's disease: Secondary | ICD-10-CM | POA: Diagnosis not present

## 2018-12-13 DIAGNOSIS — G20C Parkinsonism, unspecified: Secondary | ICD-10-CM

## 2018-12-13 DIAGNOSIS — R413 Other amnesia: Secondary | ICD-10-CM | POA: Diagnosis not present

## 2018-12-13 NOTE — Progress Notes (Signed)
Reason for visit: Parkinson's disease  Patricia Fitzpatrick is an 73 y.o. female  History of present illness:  Ms. Patricia Fitzpatrick is a 73 year old right-handed white female with a history of Parkinson's disease.  The patient had a fall in the summer and developed significant hip pain and back pain.  This has gotten much better, she no longer requires medications for pain or any muscle relaxants.  The patient has not had any further falls, she uses a cane or a walker on occasion.  The patient is trying to exercise on a regular basis, she has fairly good mobility.  She denies any significant issues with swallowing or choking.  She does have some tremulousness of the hands bilaterally.  She returns for an evaluation.  She remains on Mirapex and low-dose Sinemet.  Past Medical History:  Diagnosis Date  . Anxiety   . Cataracts, bilateral   . Depression   . High cholesterol   . Hypertension   . OCD (obsessive compulsive disorder)   . Parkinsonism ([place]) 10/14/2015  . Tremor     Past Surgical History:  Procedure Laterality Date  . BREAST BIOPSY  1981  . CATARACT EXTRACTION Right 2017  . EYE SURGERY  1952    Family History  Problem Relation Age of Onset  . Pulmonary disease Mother   . Alzheimer's disease Mother   . Heart disease Father   . Alzheimer's disease Father     Social history:  reports that she has never smoked. She has never used smokeless tobacco. She reports current alcohol use. She reports that she does not use drugs.   No Known Allergies  Medications:  Prior to Admission medications   Medication Sig Start Date End Date Taking? Authorizing Provider  Acetaminophen (TYLENOL EXTRA STRENGTH PO) Take by mouth.   Yes [provider]  alendronate (FOSAMAX) 70 MG tablet Take 70 mg by mouth once a week. With 8 ounces of water needs to remain upright for 30 minutes and no food or drink for 30 minutes 09/23/16  Yes [provider]  Ascorbic Acid (VITAMIN C PO) Take 100 mg by  mouth 2 (two) times a week.    Yes [provider]  carbidopa-levodopa (SINEMET IR) 25-100 MG tablet Take 1 tablet by mouth 3 (three) times daily. 05/31/18  Yes Kathrynn Ducking, MD  Cholecalciferol (VITAMIN D-3 PO) Take 1,000 Units by mouth daily.    Yes [provider]  diclofenac sodium (VOLTAREN) 1 % GEL Apply 2 g topically 4 (four) times daily as needed. Patient taking differently: Apply 2 g topically 4 (four) times daily as needed (pain).  08/26/18  Yes Long, Wonda Olds, MD  FLUoxetine (PROZAC) 20 MG capsule Take 80 mg by mouth daily.  01/12/16  Yes [provider]  gabapentin (NEURONTIN) 100 MG capsule Take 100-200 mg by mouth 2 (two) times daily as needed (nerve pain).  10/04/17  Yes [provider]  HYDROcodone-acetaminophen (NORCO/VICODIN) 5-325 MG tablet Take 1 tablet by mouth every 6 (six) hours as needed for moderate pain. 09/18/18  Yes Kathrynn Ducking, MD  meloxicam (MOBIC) 7.5 MG tablet Take 7.5 mg by mouth daily.  10/20/17  Yes [provider]  methocarbamol (ROBAXIN) 500 MG tablet TAKE 1 TABLET(500 MG) BY MOUTH THREE TIMES DAILY 09/25/18  Yes Kathrynn Ducking, MD  Multiple Vitamin (MULTIVITAMIN) tablet Take 1 tablet by mouth daily.   Yes [provider]  pramipexole (MIRAPEX) 0.5 MG tablet TAKE 1 TABLET(0.5 MG) BY  MOUTH THREE TIMES DAILY Patient taking differently: Take 0.25 mg by mouth 3 (three) times daily.  05/31/18  Yes Kathrynn Ducking, MD  predniSONE (DELTASONE) 5 MG tablet Begin taking 6 tablets daily, taper by one tablet daily until off the medication. 09/06/18  Yes Kathrynn Ducking, MD    ROS:  Out of a complete 14 system review of symptoms, the patient complains only of the following symptoms, and all other reviewed systems are negative.  Tremor Gait instability  Blood pressure (!) 133/95, pulse 92, temperature (!) 97.5 F (36.4 C), temperature source Temporal, height 5\' 1"  (1.549 m), weight 119 lb (54 kg).  Physical  Exam  General: The patient is alert and cooperative at the time of the examination.  Skin: No significant peripheral edema is noted.   Neurologic Exam  Mental status: The patient is alert and oriented x 3 at the time of the examination. The patient has apparent normal recent and remote memory, with an apparently normal attention span and concentration ability.   Cranial nerves: Facial symmetry is present. Speech is normal, no aphasia or dysarthria is noted. Extraocular movements are full. Visual fields are full.  Masking the face is seen.  There appears to be some proptosis of the left eye.  Motor: The patient has good strength in all 4 extremities.  Sensory examination: Soft touch sensation is symmetric on the face, arms, and legs.  Coordination: The patient has good finger-nose-finger and heel-to-shin bilaterally.  Gait and station: The patient is able to arise from a seated position with arms crossed with some difficulty.  Once up, she is able to ambulate without assistance, she actually has fairly symmetric arm swing, fairly good turns.  She has slightly unsteady tandem gait.  Romberg is negative.  Reflexes: Deep tendon reflexes are symmetric.   Assessment/Plan:  1.  Parkinson's disease  The patient seems to be doing fairly well on a fairly low dose of the Sinemet, she is walking fairly well.  We will continue the current dose of the Mirapex and Sinemet, she will follow-up here in 5 months.  She will call for any concerns.  Jill Alexanders MD 12/13/2018 2:05 PM  [facility] [address] [place], [place] [address]  Phone [phone] Fax [phone]

## 2018-12-26 ENCOUNTER — Telehealth: Payer: Self-pay | Admitting: Neurology

## 2018-12-26 NOTE — Telephone Encounter (Signed)
Pt has called asking that Dr Jannifer Franklin be aware that as of late pt has been under a lot of stress.  Pt aware that some Parkinson's medications can cause hallucinations.  Pt states at night she see's things, when she turns the light on the images are gone.  Pt is asking for a call to discuss

## 2018-12-26 NOTE — Telephone Encounter (Signed)
I called the patient.  The patient recently has had some nocturnal visual hallucinations, seeing people inside her bedroom when she wakes up.  She does not have these hallucinations when she turned on the light or she is up during the day.  She has been under a lot of stress as her brother is getting depressed and not functioning well.  We discussed the possibility of going on very low-dose Seroquel, 12.5 mg at night, she wants to hold off on this and call me if the episodes are ongoing and continuing.  She will stay on the Mirapex and Sinemet dosing as is for now.

## 2018-12-26 NOTE — Telephone Encounter (Signed)
I called the patient, left message, I will call back later. 

## 2019-01-01 NOTE — Telephone Encounter (Signed)
Pt has called to report that she no longer wants anything for hallucinations. Pt states they have gotten better. Please call pt to discuss.

## 2019-01-01 NOTE — Telephone Encounter (Signed)
I returned the pt's call. She reports her Hallucinations have improved and at this time and would like to continue on holding off on the Seroquel. Pt advised to call back if things were to change.

## 2019-01-08 NOTE — Telephone Encounter (Signed)
Tried to call pts listed preferred number it was busy both times. RN will try again.

## 2019-01-08 NOTE — Telephone Encounter (Signed)
Patient called back and states her hallucinations are beginning to be as bad as the first time she called and would like to go ahead and begin taking the seroquel. Please follow up.   Pharmacy:[facility] on [phone]

## 2019-01-09 MED ORDER — QUETIAPINE FUMARATE 25 MG PO TABS: ORAL_TABLET | ORAL | 1 refills | Status: DC

## 2019-01-09 NOTE — Addendum Note (Signed)
Addended by: Kathrynn Ducking on: 01/09/2019 04:22 PM   Modules accepted: Orders

## 2019-01-09 NOTE — Telephone Encounter (Signed)
I called pt back she would like to try the seroquel at [facility]. She states the hallucinations are occurring at [facility] after midnight.every night She would like to have the seroquel to see will it help her. I stated message will be sent to Dr. Jannifer Franklin for medication. Pt verbalized understanding.

## 2019-01-09 NOTE — Telephone Encounter (Signed)
Pt has called to inform that the medication that was supposed to be called in for her hallucinations is not at the pharmacy.  Pt is asking for another call back from RN

## 2019-01-09 NOTE — Telephone Encounter (Signed)
I called the patient.  The patient is still having hallucinations in the evening hours, alcohol and Seroquel, she will take one half of a tablet in the evening of the 25 mg tablets.

## 2019-02-07 ENCOUNTER — Telehealth: Payer: Self-pay | Admitting: Neurology

## 2019-02-07 NOTE — Telephone Encounter (Signed)
I called the patient.  She has dropped some of her pills, she needs an early refill on her Seroquel, I will get the pharmacist to do this.

## 2019-02-07 NOTE — Telephone Encounter (Signed)
Pt called stating that she does not have enough of her QUEtiapine (SEROQUEL) 25 MG tablet to last her till her next refill. Pt states she has dropped several of them and cant find them. Please advise.

## 2019-02-13 DIAGNOSIS — Z1231 Encounter for screening mammogram for malignant neoplasm of breast: Secondary | ICD-10-CM | POA: Diagnosis not present

## 2019-02-26 DIAGNOSIS — H2512 Age-related nuclear cataract, left eye: Secondary | ICD-10-CM | POA: Diagnosis not present

## 2019-02-26 DIAGNOSIS — H43811 Vitreous degeneration, right eye: Secondary | ICD-10-CM | POA: Diagnosis not present

## 2019-02-26 DIAGNOSIS — D3131 Benign neoplasm of right choroid: Secondary | ICD-10-CM | POA: Diagnosis not present

## 2019-02-28 ENCOUNTER — Ambulatory Visit (INDEPENDENT_AMBULATORY_CARE_PROVIDER_SITE_OTHER): Payer: PPO | Admitting: Otolaryngology

## 2019-03-15 ENCOUNTER — Telehealth: Payer: Self-pay

## 2019-03-15 NOTE — Telephone Encounter (Signed)
I called pt about seroquel medication. She stated the pills are small and wanted to take a whole pill. I ask pt if she was having less hallucinations on the medication. Pt stated her hallucinations are better since taking it months ago. I advise pt to continue the dosage of 1/2 that Dr. Eugenie Birks recommend. I also advise her to call back if the hallucinations increase. She verbalized understanding.

## 2019-03-15 NOTE — Telephone Encounter (Signed)
Please ensure you get adequate  information from patients please, I'd like to know why she wants to increase the medication especially since she is not my primary patient thanks

## 2019-03-15 NOTE — Telephone Encounter (Signed)
Patient called wanting to know if she could take equivalent of 1 whole QUEtiapine (SEROQUEL) 25 MG tablet. She states she has been taking halves of this medication that the pharmacy cut for her.   Please follow up.

## 2019-03-17 ENCOUNTER — Ambulatory Visit: Payer: PPO

## 2019-03-22 ENCOUNTER — Ambulatory Visit: Payer: PPO | Attending: Internal Medicine

## 2019-03-22 DIAGNOSIS — Z23 Encounter for immunization: Secondary | ICD-10-CM | POA: Insufficient documentation

## 2019-03-22 NOTE — Progress Notes (Signed)
   Covid-19 Vaccination Clinic  Name:  Patricia Fitzpatrick    MRN: [id number] DOB: [birth year]  03/22/2019  [name] was observed post Covid-19 immunization for 15 minutes without incidence. She was provided with Vaccine Information Sheet and instruction to access the V-Safe system.   [name] was instructed to call 911 with any severe reactions post vaccine: Marland Kitchen Difficulty breathing  . Swelling of your face and throat  . A fast heartbeat  . A bad rash all over your body  . Dizziness and weakness    Immunizations Administered    Name Date Dose VIS Date Route   Pfizer COVID-19 Vaccine 03/22/2019 12:03 PM 0.3 mL 01/26/2019 Intramuscular   Manufacturer: [place]   Lot: [id number]   [place]: [id number]

## 2019-03-28 ENCOUNTER — Ambulatory Visit: Payer: PPO

## 2019-04-17 ENCOUNTER — Ambulatory Visit: Payer: PPO | Attending: Internal Medicine

## 2019-04-17 DIAGNOSIS — Z23 Encounter for immunization: Secondary | ICD-10-CM

## 2019-04-17 NOTE — Progress Notes (Signed)
   Covid-19 Vaccination Clinic  Name:  Patricia Fitzpatrick    MRN: [id number] DOB: December 09, 1945  04/17/2019  [name] was observed post Covid-19 immunization for 15 minutes without incident. She was provided with Vaccine Information Sheet and instruction to access the V-Safe system.   [name] was instructed to call 911 with any severe reactions post vaccine: Marland Kitchen Difficulty breathing  . Swelling of face and throat  . A fast heartbeat  . A bad rash all over body  . Dizziness and weakness   Immunizations Administered    Name Date Dose VIS Date Route   Pfizer COVID-19 Vaccine 04/17/2019 11:45 AM 0.3 mL 01/26/2019 Intramuscular   Manufacturer: [place]   Lot: [id number]   [place]: [id number]

## 2019-05-16 ENCOUNTER — Ambulatory Visit: Payer: PPO | Admitting: Neurology

## 2019-05-16 ENCOUNTER — Encounter: Payer: Self-pay | Admitting: Neurology

## 2019-05-16 ENCOUNTER — Other Ambulatory Visit: Payer: Self-pay

## 2019-05-16 VITALS — BP 147/77 | HR 76 | Temp 97.1°F | Ht 60.0 in | Wt 122.5 lb

## 2019-05-16 DIAGNOSIS — R413 Other amnesia: Secondary | ICD-10-CM

## 2019-05-16 DIAGNOSIS — G20 Parkinson's disease: Secondary | ICD-10-CM

## 2019-05-16 MED ORDER — QUETIAPINE FUMARATE 25 MG PO TABS: 25.0000 mg | ORAL_TABLET | Freq: Every day | ORAL | 1 refills | Status: DC

## 2019-05-16 MED ORDER — PRAMIPEXOLE DIHYDROCHLORIDE 0.5 MG PO TABS: ORAL_TABLET | ORAL | 3 refills | Status: DC

## 2019-05-16 NOTE — Progress Notes (Signed)
Reason for visit: Parkinson's disease, mild memory disturbance  Patricia Fitzpatrick is an 74 y.o. female  History of present illness:  Patricia Fitzpatrick is a 74 year old right-handed white female with a history of Parkinson's disease.  The patient is on low-dose Sinemet taking 25/100 mg tablets, 1 tablet 3 times daily.  She takes Mirapex 0.5 mg 3 times daily.  She has been on Seroquel taking 25 mg at night for hallucinations which has been helpful.  The patient may have some difficulty with ambulation if she is extremely tired, but otherwise she functions fairly well.  She denies any problems with choking with swallowing, she generally sleeps fairly well at night.  She returns to the office today for an evaluation.  Overall, she is pleased with how she is functioning.  Past Medical History:  Diagnosis Date  . Anxiety   . Cataracts, bilateral   . Depression   . High cholesterol   . Hypertension   . OCD (obsessive compulsive disorder)   . Parkinsonism ([place]) 10/14/2015  . Tremor     Past Surgical History:  Procedure Laterality Date  . BREAST BIOPSY  1981  . CATARACT EXTRACTION Right 2017  . EYE SURGERY  1952    Family History  Problem Relation Age of Onset  . Pulmonary disease Mother   . Alzheimer's disease Mother   . Heart disease Father   . Alzheimer's disease Father     Social history:  reports that she has never smoked. She has never used smokeless tobacco. She reports current alcohol use. She reports that she does not use drugs.   No Known Allergies  Medications:  Prior to Admission medications   Medication Sig Start Date End Date Taking? Authorizing Provider  Acetaminophen (TYLENOL EXTRA STRENGTH PO) Take by mouth.   Yes Provider, Historical, Fitzpatrick  alendronate (FOSAMAX) 70 MG tablet Take 70 mg by mouth once a week. With 8 ounces of water needs to remain upright for 30 minutes and no food or drink for 30 minutes 09/23/16  Yes Provider, Historical, Fitzpatrick  Ascorbic Acid (VITAMIN C PO) Take  100 mg by mouth 2 (two) times a week.    Yes Provider, Historical, Fitzpatrick  carbidopa-levodopa (SINEMET IR) 25-100 MG tablet Take 1 tablet by mouth 3 (three) times daily. 05/31/18  Yes Kathrynn Ducking, Fitzpatrick  Cholecalciferol (VITAMIN D-3 PO) Take 1,000 Units by mouth daily.    Yes Provider, Historical, Fitzpatrick  diclofenac sodium (VOLTAREN) 1 % GEL Apply 2 g topically 4 (four) times daily as needed. 08/26/18  Yes Long, Wonda Olds, Fitzpatrick  FLUoxetine (PROZAC) 20 MG capsule Take 80 mg by mouth daily.  01/12/16  Yes Provider, Historical, Fitzpatrick  gabapentin (NEURONTIN) 100 MG capsule Take 100-200 mg by mouth 2 (two) times daily as needed (nerve pain).  10/04/17  Yes Provider, Historical, Fitzpatrick  meloxicam (MOBIC) 7.5 MG tablet Take 7.5 mg by mouth daily.  10/20/17  Yes Provider, Historical, Fitzpatrick  Multiple Vitamin (MULTIVITAMIN) tablet Take 1 tablet by mouth daily.   Yes Provider, Historical, Fitzpatrick  pramipexole (MIRAPEX) 0.5 MG tablet TAKE 1 TABLET(0.5 MG) BY MOUTH THREE TIMES DAILY Patient taking differently: Take 0.25 mg by mouth 3 (three) times daily.  05/31/18  Yes Kathrynn Ducking, Fitzpatrick  QUEtiapine (SEROQUEL) 25 MG tablet 1/2 tablet at night Patient taking differently: 50 mg.  01/09/19  Yes Kathrynn Ducking, Fitzpatrick    ROS:  Out of a complete 14 system review of symptoms, the patient complains only  of the following symptoms, and all other reviewed systems are negative.  Back pain Shortness of breath  Blood pressure (!) 147/77, pulse 76, temperature (!) 97.1 F (36.2 C), height 5' (1.524 m), weight 122 lb 8 oz (55.6 kg).  Physical Exam  General: The patient is alert and cooperative at the time of the examination.  Skin: No significant peripheral edema is noted.   Neurologic Exam  Mental status: The patient is alert and oriented x 3 at the time of the examination. The patient has apparent normal recent and remote memory, with an apparently normal attention span and concentration ability.   Cranial nerves: Facial symmetry is  present. Speech is normal, no aphasia or dysarthria is noted. Extraocular movements are full. Visual fields are full.  Masking of the face is seen.  Motor: The patient has good strength in all 4 extremities.  Sensory examination: Soft touch sensation is symmetric on the face, arms, and legs.  Coordination: The patient has good finger-nose-finger and heel-to-shin bilaterally.  Gait and station: The patient is able to arise from a seated position with arms crossed.  Once up, she can walk without assistance, decreased arm swing is seen, slight stooped posture is noted.  She has inability to perform tandem gait.  Romberg is negative.  No tremors are noted.  Reflexes: Deep tendon reflexes are symmetric.   Assessment/Plan:  1.  Parkinson's disease  2.  Mild memory disturbance  The patient overall is doing fairly well, her hallucinations have been improved with the Seroquel, a prescription was sent in.  She will continue on the Sinemet taking the 25/100 mg tablets 3 times daily and the Mirapex 0.5 mg 3 times daily.  She will follow-up here in 5 months.  Patricia Fitzpatrick 05/16/2019 11:32 AM  [facility] [address] [place], [place] [address]  Phone [phone] Fax [phone]

## 2019-06-13 ENCOUNTER — Telehealth: Payer: Self-pay | Admitting: Neurology

## 2019-06-13 ENCOUNTER — Other Ambulatory Visit: Payer: Self-pay

## 2019-06-13 MED ORDER — CARBIDOPA-LEVODOPA 25-100 MG PO TABS: 1.0000 | ORAL_TABLET | Freq: Three times a day (TID) | ORAL | 3 refills | Status: DC

## 2019-06-13 NOTE — Telephone Encounter (Signed)
Refill sent to [facility] for 90 day supply and 3 refills. This refill is good until [employer] 28, 2022 unless pt changes insurances or dosage changes.

## 2019-06-13 NOTE — Telephone Encounter (Signed)
Pt is needing a new prescription for her carbidopa-levodopa (SINEMET IR) 25-100 MG tablet sent in to the [facility] in [place]

## 2019-07-11 ENCOUNTER — Other Ambulatory Visit: Payer: Self-pay

## 2019-07-11 MED ORDER — PRAMIPEXOLE DIHYDROCHLORIDE 0.5 MG PO TABS: ORAL_TABLET | ORAL | 3 refills | Status: DC

## 2019-07-24 DIAGNOSIS — E7849 Other hyperlipidemia: Secondary | ICD-10-CM | POA: Diagnosis not present

## 2019-07-24 DIAGNOSIS — M810 Age-related osteoporosis without current pathological fracture: Secondary | ICD-10-CM | POA: Diagnosis not present

## 2019-08-07 DIAGNOSIS — R440 Auditory hallucinations: Secondary | ICD-10-CM | POA: Diagnosis not present

## 2019-08-07 DIAGNOSIS — Z9181 History of falling: Secondary | ICD-10-CM | POA: Diagnosis not present

## 2019-08-07 DIAGNOSIS — G20 Parkinson's disease: Secondary | ICD-10-CM | POA: Diagnosis not present

## 2019-08-07 DIAGNOSIS — F419 Anxiety disorder, unspecified: Secondary | ICD-10-CM | POA: Diagnosis not present

## 2019-08-07 DIAGNOSIS — R82998 Other abnormal findings in urine: Secondary | ICD-10-CM | POA: Diagnosis not present

## 2019-08-07 DIAGNOSIS — R32 Unspecified urinary incontinence: Secondary | ICD-10-CM | POA: Diagnosis not present

## 2019-08-07 DIAGNOSIS — R627 Adult failure to thrive: Secondary | ICD-10-CM | POA: Diagnosis not present

## 2019-08-07 DIAGNOSIS — Z0000 Encounter for general adult medical examination without abnormal findings: Secondary | ICD-10-CM | POA: Diagnosis not present

## 2019-08-07 DIAGNOSIS — Z1331 Encounter for screening for depression: Secondary | ICD-10-CM | POA: Diagnosis not present

## 2019-08-07 DIAGNOSIS — R634 Abnormal weight loss: Secondary | ICD-10-CM | POA: Diagnosis not present

## 2019-08-07 DIAGNOSIS — D692 Other nonthrombocytopenic purpura: Secondary | ICD-10-CM | POA: Diagnosis not present

## 2019-08-07 DIAGNOSIS — I10 Essential (primary) hypertension: Secondary | ICD-10-CM | POA: Diagnosis not present

## 2019-08-07 DIAGNOSIS — M810 Age-related osteoporosis without current pathological fracture: Secondary | ICD-10-CM | POA: Diagnosis not present

## 2019-08-07 DIAGNOSIS — F329 Major depressive disorder, single episode, unspecified: Secondary | ICD-10-CM | POA: Diagnosis not present

## 2019-08-08 DIAGNOSIS — Z1331 Encounter for screening for depression: Secondary | ICD-10-CM | POA: Diagnosis not present

## 2019-08-17 DIAGNOSIS — Z1212 Encounter for screening for malignant neoplasm of rectum: Secondary | ICD-10-CM | POA: Diagnosis not present

## 2019-08-22 NOTE — Telephone Encounter (Signed)
error 

## 2019-09-14 ENCOUNTER — Other Ambulatory Visit: Payer: Self-pay | Admitting: Neurology

## 2019-10-01 ENCOUNTER — Telehealth: Payer: Self-pay | Admitting: Neurology

## 2019-10-01 NOTE — Telephone Encounter (Signed)
I called the patient.  Current recommendations are for for booster shots for individuals with an immunosuppressed state such as transplant patients or cancer patients.  For now, the patient would not require a booster shot.

## 2019-10-01 NOTE — Telephone Encounter (Signed)
Pt would like to know if Dr Jannifer Franklin thinks she should get the  booster shot for COVID-19, please call

## 2019-10-30 DIAGNOSIS — D3131 Benign neoplasm of right choroid: Secondary | ICD-10-CM | POA: Diagnosis not present

## 2019-10-30 DIAGNOSIS — H16223 Keratoconjunctivitis sicca, not specified as Sjogren's, bilateral: Secondary | ICD-10-CM | POA: Diagnosis not present

## 2019-10-30 DIAGNOSIS — H26491 Other secondary cataract, right eye: Secondary | ICD-10-CM | POA: Diagnosis not present

## 2019-11-01 ENCOUNTER — Other Ambulatory Visit: Payer: Self-pay

## 2019-11-01 ENCOUNTER — Encounter: Payer: Self-pay | Admitting: Neurology

## 2019-11-01 ENCOUNTER — Ambulatory Visit: Payer: PPO | Admitting: Neurology

## 2019-11-01 VITALS — BP 142/89 | HR 73 | Ht 60.0 in | Wt 116.0 lb

## 2019-11-01 DIAGNOSIS — R251 Tremor, unspecified: Secondary | ICD-10-CM | POA: Diagnosis not present

## 2019-11-01 DIAGNOSIS — R413 Other amnesia: Secondary | ICD-10-CM | POA: Diagnosis not present

## 2019-11-01 DIAGNOSIS — G20 Parkinson's disease: Secondary | ICD-10-CM

## 2019-11-01 MED ORDER — CARBIDOPA-LEVODOPA 25-100 MG PO TABS: 1.5000 | ORAL_TABLET | Freq: Three times a day (TID) | ORAL | 3 refills | Status: DC

## 2019-11-01 NOTE — Progress Notes (Signed)
Reason for visit: Parkinson's disease, memory disturbance  Patricia Fitzpatrick is an 74 y.o. female  History of present illness:  Patricia Fitzpatrick is a 74 year old right-handed white female with a history of Parkinson's disease.  She lives at home with her stepdaughter and brother who appear to be low functioning individuals.  This puts a lot of stress on her and she feels depressed at times.  The patient tries to exercise occasionally, she does not do this regularly.  She has had an occasional fall when she is out in the yard trying to do yard work.  She does have occasional hallucinations, she is on Seroquel.  She is noting some increased problems standing up from a seated position if she is in a couch or a lounge chair.  She does report some ongoing troubles with memory.  She occasionally will forget to take her medications properly.  She returns to the office today for an evaluation.  Past Medical History:  Diagnosis Date  . Anxiety   . Cataracts, bilateral   . Depression   . High cholesterol   . Hypertension   . OCD (obsessive compulsive disorder)   . Parkinsonism ([place]) 10/14/2015  . Tremor     Past Surgical History:  Procedure Laterality Date  . BREAST BIOPSY  1981  . CATARACT EXTRACTION Right 2017  . EYE SURGERY  1952    Family History  Problem Relation Age of Onset  . Pulmonary disease Mother   . Alzheimer's disease Mother   . Heart disease Father   . Alzheimer's disease Father     Social history:  reports that she has never smoked. She has never used smokeless tobacco. She reports current alcohol use. She reports that she does not use drugs.   No Known Allergies  Medications:  Prior to Admission medications   Medication Sig Start Date End Date Taking? Authorizing Provider  QUEtiapine (SEROQUEL) 25 MG tablet TAKE 1 TABLET(25 MG) BY MOUTH AT BEDTIME 09/17/19   Kathrynn Ducking, MD  Acetaminophen (TYLENOL EXTRA STRENGTH PO) Take by mouth.    [provider]  alendronate  (FOSAMAX) 70 MG tablet Take 70 mg by mouth once a week. With 8 ounces of water needs to remain upright for 30 minutes and no food or drink for 30 minutes 09/23/16   [provider]  Ascorbic Acid (VITAMIN C PO) Take 100 mg by mouth 2 (two) times a week.     [provider]  carbidopa-levodopa (SINEMET IR) 25-100 MG tablet Take 1 tablet by mouth 3 (three) times daily. 06/13/19   Kathrynn Ducking, MD  Cholecalciferol (VITAMIN D-3 PO) Take 1,000 Units by mouth daily.     [provider]  diclofenac sodium (VOLTAREN) 1 % GEL Apply 2 g topically 4 (four) times daily as needed. 08/26/18   Long, Wonda Olds, MD  FLUoxetine (PROZAC) 20 MG capsule Take 80 mg by mouth daily.  01/12/16   [provider]  gabapentin (NEURONTIN) 100 MG capsule Take 100-200 mg by mouth 2 (two) times daily as needed (nerve pain).  10/04/17   [provider]  meloxicam (MOBIC) 7.5 MG tablet Take 7.5 mg by mouth daily.  10/20/17   [provider]  Multiple Vitamin (MULTIVITAMIN) tablet Take 1 tablet by mouth daily.    [provider]  pramipexole (MIRAPEX) 0.5 MG tablet TAKE 1 TABLET(0.5 MG) BY MOUTH THREE TIMES DAILY 07/11/19   Kathrynn Ducking, MD    ROS:  Out  of a complete 14 system review of symptoms, the patient complains only of the following symptoms, and all other reviewed systems are negative.  Memory problems Walking difficulty Depression  Weight 116 lb (52.6 kg).  Physical Exam  General: The patient is alert and cooperative at the time of the examination.  Skin: No significant peripheral edema is noted.   Neurologic Exam  Mental status: The patient is alert and oriented x 3 at the time of the examination. The Mini-Mental status examination done today shows a total score 25/30.  The patient is able to name 20 four-legged animals in 60 seconds.   Cranial nerves: Facial symmetry is present. Speech is normal, no aphasia or dysarthria is noted. Extraocular  movements are full. Visual fields are full.  Masking of the face is seen.  Motor: The patient has good strength in all 4 extremities.  Sensory examination: Soft touch sensation is symmetric on the face, arms, and legs.  Coordination: The patient has good finger-nose-finger and heel-to-shin bilaterally.  Gait and station: The patient has some difficulty arising from a seated position with arms crossed, she has a tendency to lean backwards.  Once up, she can walk independently, decreased arm swing is seen.  She is able to perform tandem gait.  Romberg is negative. The patient may use a cane with walking.  Reflexes: Deep tendon reflexes are symmetric.   Assessment/Plan:  1.  Parkinson's disease  2.  Memory disturbance  3.  Hallucinations  The patient will be placed on a slightly higher dose of Sinemet going to 1.5 tablets 3 times daily.  She will need to watch out for increasing hallucinations.  We will follow the memory issues over time and consider medication for memory in the future.  She will follow up here in 5 months.  Jill Alexanders MD 11/01/2019 9:42 AM  [facility] [address] [place], [place] [address]  Phone [phone] Fax [phone]

## 2019-11-01 NOTE — Patient Instructions (Signed)
We will go up on the Sinemet to 1.5 tablets three times a day.  Sinemet (carbidopa) may result in confusion or hallucinations, drowsiness, nausea, or dizziness. If any significant side effects are noted, please contact our office. Sinemet may not be well absorbed when taken with high protein meals, if tolerated it is best to take 30-45 minutes before you eat.

## 2020-01-22 DIAGNOSIS — R634 Abnormal weight loss: Secondary | ICD-10-CM | POA: Diagnosis not present

## 2020-01-22 DIAGNOSIS — R627 Adult failure to thrive: Secondary | ICD-10-CM | POA: Diagnosis not present

## 2020-01-22 DIAGNOSIS — R443 Hallucinations, unspecified: Secondary | ICD-10-CM | POA: Diagnosis not present

## 2020-01-22 DIAGNOSIS — G20 Parkinson's disease: Secondary | ICD-10-CM | POA: Diagnosis not present

## 2020-01-22 DIAGNOSIS — R413 Other amnesia: Secondary | ICD-10-CM | POA: Diagnosis not present

## 2020-01-22 DIAGNOSIS — D692 Other nonthrombocytopenic purpura: Secondary | ICD-10-CM | POA: Diagnosis not present

## 2020-01-22 DIAGNOSIS — Z9181 History of falling: Secondary | ICD-10-CM | POA: Diagnosis not present

## 2020-01-22 DIAGNOSIS — R2689 Other abnormalities of gait and mobility: Secondary | ICD-10-CM | POA: Diagnosis not present

## 2020-01-22 DIAGNOSIS — E785 Hyperlipidemia, unspecified: Secondary | ICD-10-CM | POA: Diagnosis not present

## 2020-01-22 DIAGNOSIS — I10 Essential (primary) hypertension: Secondary | ICD-10-CM | POA: Diagnosis not present

## 2020-01-22 DIAGNOSIS — M810 Age-related osteoporosis without current pathological fracture: Secondary | ICD-10-CM | POA: Diagnosis not present

## 2020-01-22 DIAGNOSIS — F419 Anxiety disorder, unspecified: Secondary | ICD-10-CM | POA: Diagnosis not present

## 2020-03-06 ENCOUNTER — Ambulatory Visit: Payer: PPO | Admitting: Neurology

## 2020-04-10 ENCOUNTER — Telehealth: Payer: Self-pay | Admitting: Neurology

## 2020-04-10 NOTE — Telephone Encounter (Signed)
Pt called, picked up my prescription, read the side effects. Some of the side effects I can relate to, nervousness, tired, sleepy, stuffy nose, constipation. Is there something else you can prescribe me? Would like a call from the nurse.

## 2020-04-10 NOTE — Telephone Encounter (Signed)
Called patient back.  Stated she read for the first time the literature that came with the Seroquel.  She now believes the mirapex and the sinemet is causing the hallucinations.  She is concerned about SOB, fatigue because of taking it.  And she read there were a higher percentage of death in older people.    Strongly encouraged patient to NOT stop taking the medication until dr. Jannifer Franklin had a chance to review the information and assured her that we would contact her

## 2020-04-11 NOTE — Telephone Encounter (Signed)
I called the patient, she is concerned about some of the side effects or potential side effects of Seroquel.  She overall feels that she is doing well, she is not wanting to come off the medication at this point.  The Sinemet and the Mirapex can also cause fatigue and daytime drowsiness.  She will call me if she is having increased side effects.

## 2020-04-28 ENCOUNTER — Encounter (HOSPITAL_BASED_OUTPATIENT_CLINIC_OR_DEPARTMENT_OTHER): Payer: Self-pay | Admitting: *Deleted

## 2020-04-28 ENCOUNTER — Emergency Department (HOSPITAL_BASED_OUTPATIENT_CLINIC_OR_DEPARTMENT_OTHER)
Admission: EM | Admit: 2020-04-28 | Discharge: 2020-04-28 | Disposition: A | Discharge status: Home / Self Care | Payer: PPO | Attending: Emergency Medicine | Admitting: Emergency Medicine

## 2020-04-28 ENCOUNTER — Other Ambulatory Visit: Payer: Self-pay

## 2020-04-28 ENCOUNTER — Emergency Department (HOSPITAL_BASED_OUTPATIENT_CLINIC_OR_DEPARTMENT_OTHER): Payer: PPO | Admitting: Radiology

## 2020-04-28 DIAGNOSIS — I10 Essential (primary) hypertension: Secondary | ICD-10-CM | POA: Insufficient documentation

## 2020-04-28 DIAGNOSIS — S43402A Unspecified sprain of left shoulder joint, initial encounter: Secondary | ICD-10-CM | POA: Diagnosis not present

## 2020-04-28 DIAGNOSIS — S20219A Contusion of unspecified front wall of thorax, initial encounter: Secondary | ICD-10-CM | POA: Insufficient documentation

## 2020-04-28 DIAGNOSIS — M25512 Pain in left shoulder: Secondary | ICD-10-CM | POA: Diagnosis not present

## 2020-04-28 DIAGNOSIS — W01198A Fall on same level from slipping, tripping and stumbling with subsequent striking against other object, initial encounter: Secondary | ICD-10-CM | POA: Diagnosis not present

## 2020-04-28 DIAGNOSIS — S20212A Contusion of left front wall of thorax, initial encounter: Secondary | ICD-10-CM | POA: Diagnosis not present

## 2020-04-28 DIAGNOSIS — W19XXXA Unspecified fall, initial encounter: Secondary | ICD-10-CM

## 2020-04-28 DIAGNOSIS — S4992XA Unspecified injury of left shoulder and upper arm, initial encounter: Secondary | ICD-10-CM | POA: Diagnosis present

## 2020-04-28 DIAGNOSIS — S299XXA Unspecified injury of thorax, initial encounter: Secondary | ICD-10-CM | POA: Diagnosis not present

## 2020-04-28 NOTE — ED Notes (Signed)
ED Provider at bedside. 

## 2020-04-28 NOTE — ED Provider Notes (Signed)
[facility] DEPT Provider Note   CSN: [id number] Arrival date & time: 04/28/20  1013     History Chief Complaint  Patient presents with  . Fall    Patricia Fitzpatrick is a 75 y.o. female.  HPI 75 year old female presents after a fall.  She fell 3 days ago when she was try to get out of her car and her feet did not work quite right.  This happens from time to time with her Parkinson's.  She fell and hit her head on the concrete but did not lose consciousness.  She does not have a headache and has not had any vomiting, vision changes or weakness.  She has noticed a bruise at her superior sternum and bilateral medial clavicles.  This is mildly tender on the left.  She also has some soreness in her left shoulder.  No neck or back pain.  She is not on blood thinners or aspirin.  When she called her doctor to inquire about the chest bruising, they told her to come to the ER for a head CT because she hit her head.  She is not having any trouble breathing or swallowing.  Did not have any syncope/presyncope.  Past Medical History:  Diagnosis Date  . Anxiety   . Cataracts, bilateral   . Depression   . High cholesterol   . Hypertension   . OCD (obsessive compulsive disorder)   . 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 Tremor     Patient Active Problem List   Diagnosis Date Noted  . 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 Tremor 10/14/2015  . Memory change 10/14/2015    Past Surgical History:  Procedure Laterality Date  . BREAST BIOPSY  1981  . CATARACT EXTRACTION Right 2017  . EYE SURGERY  1952     OB History   No obstetric history on file.     Family History  Problem Relation Age of Onset  . Pulmonary disease Mother   . Alzheimer's disease Mother   . Heart disease Father   . Alzheimer's disease Father     Social History   Tobacco Use  . Smoking status: Never Smoker  . Smokeless tobacco: Never Used  Substance Use Topics  . Alcohol use: Yes    Comment: One glass  per year.  . Drug use: No    Home Medications Prior to Admission medications   Medication Sig Start Date End Date Taking? Authorizing Provider  Acetaminophen (TYLENOL EXTRA STRENGTH PO) Take by mouth.   Yes [provider]  Ascorbic Acid (VITAMIN C PO) Take 100 mg by mouth 2 (two) times a week.    Yes [provider]  carbidopa-levodopa (SINEMET IR) 25-100 MG tablet Take 1.5 tablets by mouth 3 (three) times daily. 11/01/19  Yes Kathrynn Ducking, MD  Cholecalciferol (VITAMIN D-3 PO) Take 1,000 Units by mouth daily.    Yes [provider]  FLUoxetine (PROZAC) 20 MG capsule Take 80 mg by mouth daily.  01/12/16  Yes [provider]  Multiple Vitamin (MULTIVITAMIN) tablet Take 1 tablet by mouth daily.   Yes [provider]  pramipexole (MIRAPEX) 0.5 MG tablet TAKE 1 TABLET(0.5 MG) BY MOUTH THREE TIMES DAILY 07/11/19  Yes Kathrynn Ducking, MD  QUEtiapine (SEROQUEL) 25 MG tablet TAKE 1 TABLET(25 MG) BY MOUTH AT BEDTIME 09/17/19  Yes Kathrynn Ducking, MD  alendronate (FOSAMAX) 70 MG tablet Take 70 mg by mouth once a week. With 8 ounces of water needs to remain  upright for 30 minutes and no food or drink for 30 minutes 09/23/16   [provider]  meloxicam (MOBIC) 7.5 MG tablet Take 7.5 mg by mouth daily.  10/20/17   [provider]    Allergies    Patient has no known allergies.  Review of Systems   Review of Systems  Eyes: Negative for visual disturbance.  Respiratory: Negative for shortness of breath.   Cardiovascular: Positive for chest pain.  Gastrointestinal: Negative for vomiting.  Musculoskeletal: Positive for arthralgias. Negative for back pain and neck pain.  Neurological: Negative for weakness and headaches.  All other systems reviewed and are negative.   Physical Exam Updated Vital Signs BP (!) 163/82 (BP Location: Right Arm)   Pulse 70   Temp 97.7 F (36.5 C) (Oral)   Resp 16   Ht 5' (1.524 m)   Wt 50.8 kg   SpO2  100%   BMI 21.87 kg/m   Physical Exam Vitals and nursing note reviewed.  Constitutional:      Appearance: She is well-developed.  HENT:     Head: Normocephalic and atraumatic.     Right Ear: External ear normal.     Left Ear: External ear normal.     Nose: Nose normal.  Eyes:     General:        Right eye: No discharge.        Left eye: No discharge.     Extraocular Movements: Extraocular movements intact.     Pupils: Pupils are equal, round, and reactive to light.  Cardiovascular:     Rate and Rhythm: Normal rate and regular rhythm.     Heart sounds: Normal heart sounds.  Pulmonary:     Effort: Pulmonary effort is normal.     Breath sounds: Normal breath sounds.  Chest:       Comments: Mild ecchymosis. No swelling/crepitus. Mild tenderness to left medial clavicle Abdominal:     Palpations: Abdomen is soft.     Tenderness: There is no abdominal tenderness.  Musculoskeletal:     Left shoulder: No swelling or tenderness. Normal range of motion.     Cervical back: Normal range of motion and neck supple. No tenderness.     Thoracic back: No tenderness.     Lumbar back: No tenderness.     Comments: Mild pain in Left shoulder when she ranges it  Skin:    General: Skin is warm and dry.  Neurological:     Mental Status: She is alert.     Comments: CN 3-12 grossly intact. 5/5 strength in all 4 extremities. Grossly normal sensation. Normal finger to nose.   Psychiatric:        Mood and Affect: Mood is not anxious.     ED Results / Procedures / Treatments   Labs (all labs ordered are listed, but only abnormal results are displayed) Labs Reviewed - No data to display  EKG None  Radiology DG Chest 2 View  Result Date: 04/28/2020 CLINICAL DATA:  75 year old female with history of trauma from a fall. EXAM: CHEST - 2 VIEW COMPARISON:  Chest x-ray 03/20/2006. FINDINGS: Lung volumes are normal. No consolidative airspace disease. No pleural effusions. No pneumothorax. No  pulmonary nodule or mass noted. Pulmonary vasculature and the cardiomediastinal silhouette are within normal limits. Atherosclerosis in the thoracic aorta. Midthoracic vertebral body compression fracture with 90% loss of anterior vertebral body height. IMPRESSION: 1.  No radiographic evidence of acute cardiopulmonary disease. 2. Aortic atherosclerosis. Electronically Signed  By: Vinnie Langton M.D.   On: 04/28/2020 11:24   DG Shoulder Left  Result Date: 04/28/2020 CLINICAL DATA:  Pain following fall EXAM: LEFT SHOULDER - 2+ VIEW COMPARISON:  None. FINDINGS: Oblique, Y scapular, axillary images were obtained. No fracture or dislocation. There is mild generalized joint space narrowing. No erosive change. Bones are osteoporotic. Visualized left lung clear. IMPRESSION: Mild generalized joint space narrowing. Osteoporosis. No fracture or dislocation. Electronically Signed   By: Lowella Grip III M.D.   On: 04/28/2020 11:23    Procedures Procedures   Medications Ordered in ED Medications - No data to display  ED Course  I have reviewed the triage vital signs and the nursing notes.  Pertinent labs & imaging results that were available during my care of the patient were reviewed by me and considered in my medical decision making (see chart for details).    MDM Rules/Calculators/A&P                          Patient's doctor's office told her to get a head CT but at this point I do not think there is an indication given that she fell over 2 days ago, has no headache or signs/symptoms of acute neurologic compromise.  My suspicion of a significant head injury is very low.  Discussed this with patient and she agrees and would like to hold off on head CT.  Otherwise she does have this ecchymosis over her superior anterior chest and lower neck.  However there is no swelling, crepitus, or trouble swallowing/breathing.  X-rays are benign.  At this point it seems to just be a bruise and she appears stable  for discharge home with return precautions.  She is not on blood thinners. Final Clinical Impression(s) / ED Diagnoses Final diagnoses:  Fall, initial encounter  Contusion of chest wall, unspecified laterality, initial encounter  Sprain of left shoulder, unspecified shoulder sprain type, initial encounter    Rx / DC Orders ED Discharge Orders    None       Sherwood Gambler, MD 04/28/20 1140

## 2020-04-28 NOTE — ED Triage Notes (Signed)
[place] this Saturday and was advised by her PCP to come here for CT scan.  Per pt, she stated that  She hit her head on the sidewalk.

## 2020-04-28 NOTE — Discharge Instructions (Signed)
If you develop a headache, vomiting, neck pain, trouble breathing or swallowing, new or worsening pain, or any other new/concerning symptoms, then return to the ER or call 911.

## 2020-04-30 ENCOUNTER — Other Ambulatory Visit: Payer: Self-pay | Admitting: Neurology

## 2020-05-16 DIAGNOSIS — M810 Age-related osteoporosis without current pathological fracture: Secondary | ICD-10-CM | POA: Diagnosis not present

## 2020-05-16 DIAGNOSIS — Z1231 Encounter for screening mammogram for malignant neoplasm of breast: Secondary | ICD-10-CM | POA: Diagnosis not present

## 2020-05-16 DIAGNOSIS — Z13820 Encounter for screening for osteoporosis: Secondary | ICD-10-CM | POA: Diagnosis not present

## 2020-05-22 IMAGING — DX DG HIP (WITH OR WITHOUT PELVIS) 2-3V RIGHT
3 series · 3 of 3 positions shown · non-contrast
Comparison: None.

CLINICAL DATA: Pt was seen for a fall on [REDACTED] night and now has
persistent right hip pain.

EXAM:
DG HIP (WITH OR WITHOUT PELVIS) 2-3V RIGHT

[pelvis ap]
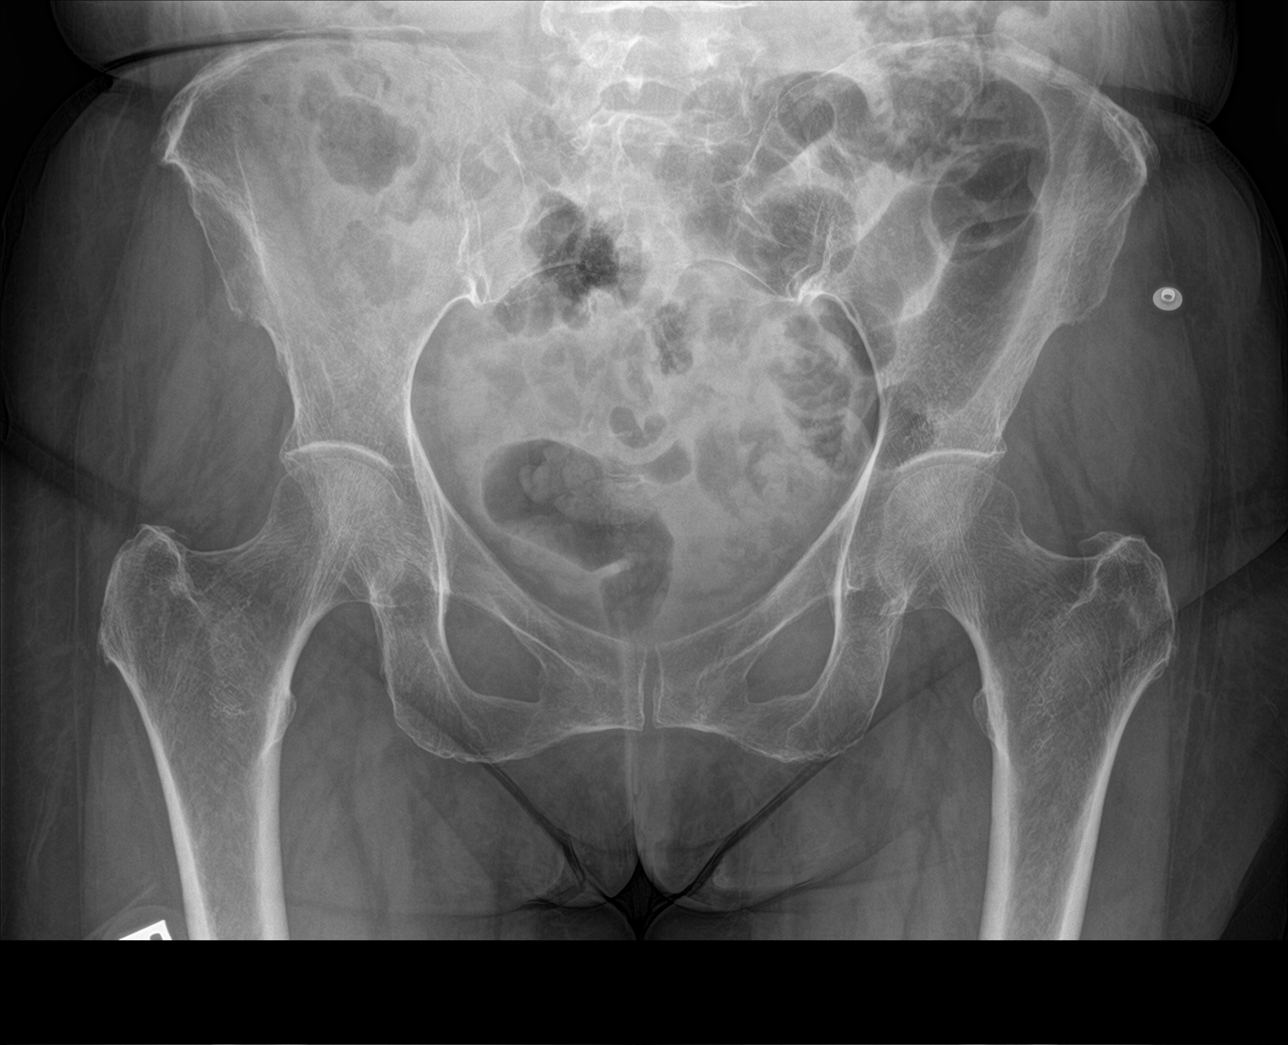

[hip ap]
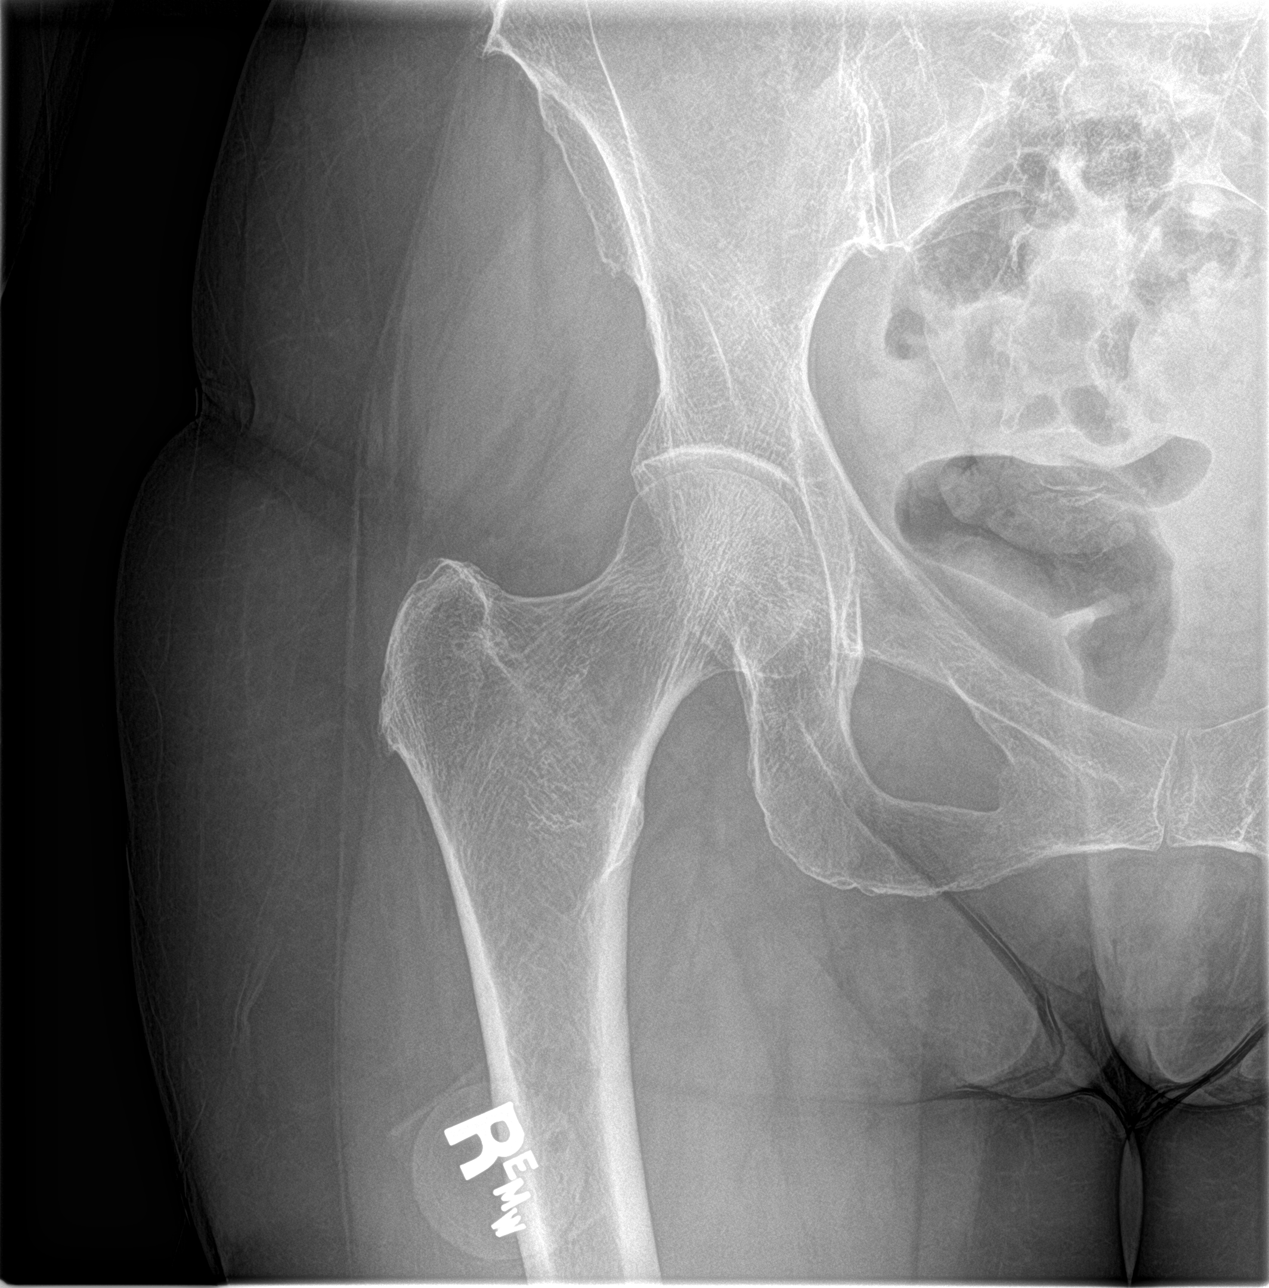

[hip lat]
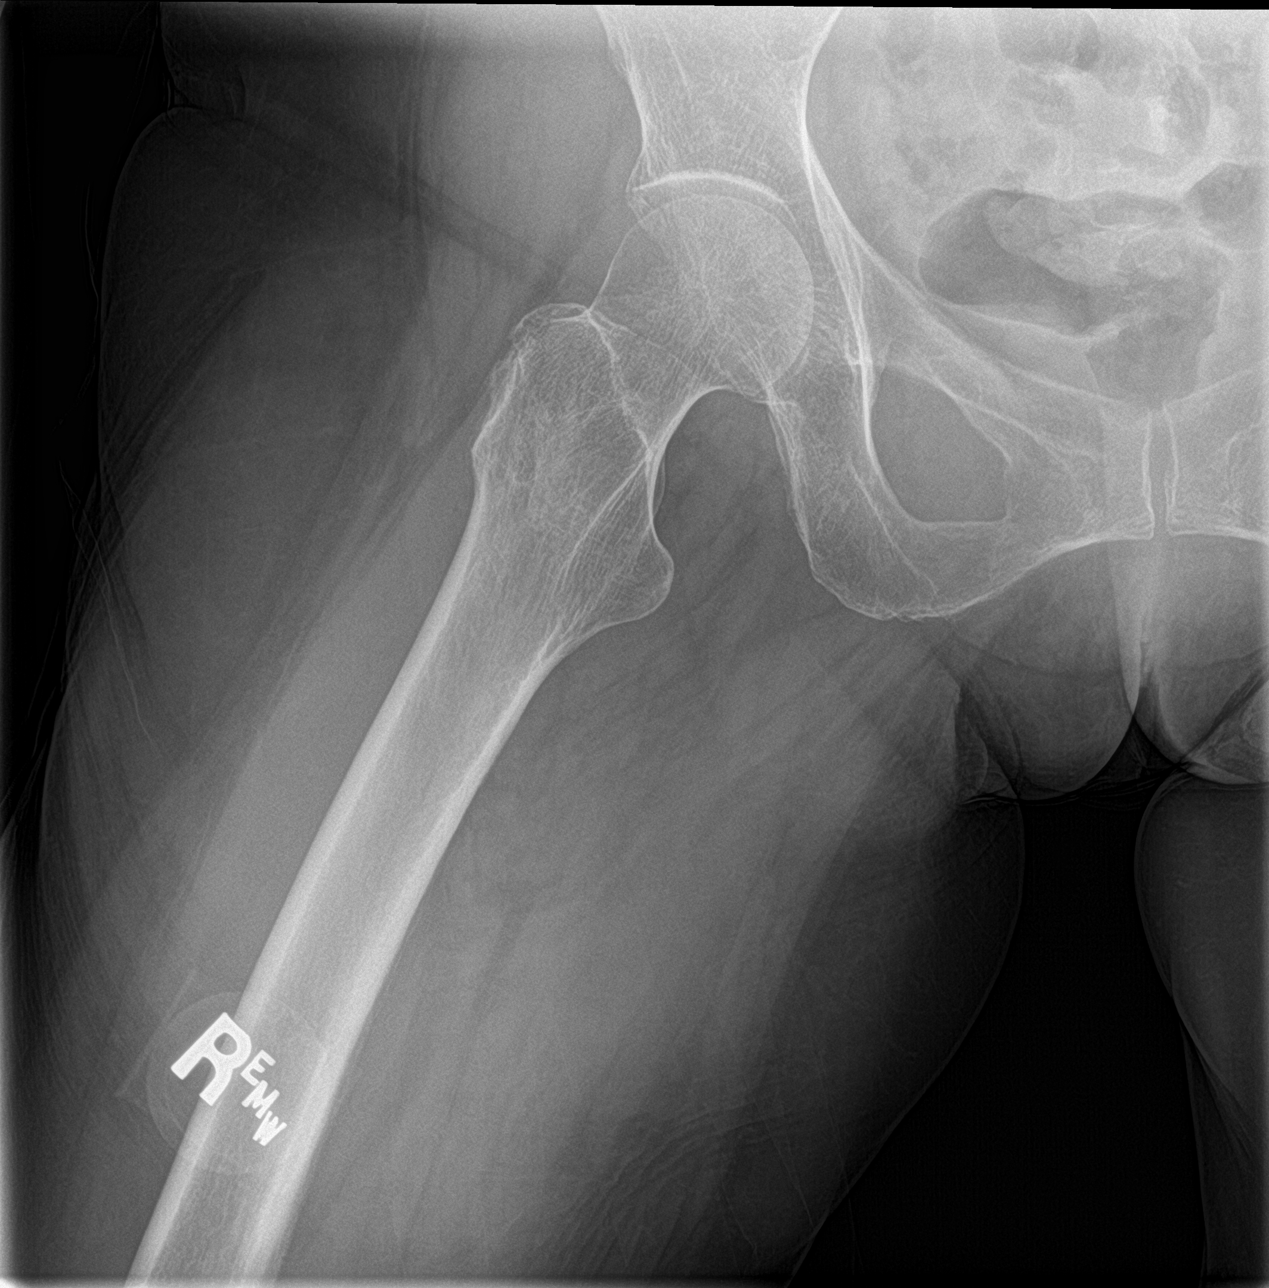

[3 of 3 positions shown; findings below may reference images not displayed]

FINDINGS: No fracture or bone lesion.

The hip joints, SI joints and symphysis pubis are normally spaced
and aligned. No arthropathic changes.

Soft tissues are unremarkable.
IMPRESSION: Negative.

## 2020-05-27 DIAGNOSIS — R922 Inconclusive mammogram: Secondary | ICD-10-CM | POA: Diagnosis not present

## 2020-05-27 DIAGNOSIS — R928 Other abnormal and inconclusive findings on diagnostic imaging of breast: Secondary | ICD-10-CM | POA: Diagnosis not present

## 2020-07-18 ENCOUNTER — Emergency Department (HOSPITAL_BASED_OUTPATIENT_CLINIC_OR_DEPARTMENT_OTHER)
Admission: EM | Admit: 2020-07-18 | Discharge: 2020-07-18 | Disposition: A | Discharge status: Home / Self Care | Payer: PPO | Attending: Emergency Medicine | Admitting: Emergency Medicine

## 2020-07-18 ENCOUNTER — Emergency Department (HOSPITAL_BASED_OUTPATIENT_CLINIC_OR_DEPARTMENT_OTHER): Payer: PPO

## 2020-07-18 ENCOUNTER — Other Ambulatory Visit: Payer: Self-pay

## 2020-07-18 ENCOUNTER — Encounter (HOSPITAL_BASED_OUTPATIENT_CLINIC_OR_DEPARTMENT_OTHER): Payer: Self-pay | Admitting: *Deleted

## 2020-07-18 DIAGNOSIS — I10 Essential (primary) hypertension: Secondary | ICD-10-CM | POA: Diagnosis not present

## 2020-07-18 DIAGNOSIS — Z79899 Other long term (current) drug therapy: Secondary | ICD-10-CM | POA: Insufficient documentation

## 2020-07-18 DIAGNOSIS — S0990XA Unspecified injury of head, initial encounter: Secondary | ICD-10-CM | POA: Diagnosis not present

## 2020-07-18 DIAGNOSIS — G20 Parkinson's disease: Secondary | ICD-10-CM | POA: Diagnosis not present

## 2020-07-18 DIAGNOSIS — R251 Tremor, unspecified: Secondary | ICD-10-CM | POA: Diagnosis present

## 2020-07-18 LAB — URINALYSIS, ROUTINE W REFLEX MICROSCOPIC
Bilirubin Urine: NEGATIVE
Glucose, UA: NEGATIVE mg/dL
Ketones, ur: 15 mg/dL — AB
Nitrite: NEGATIVE
Specific Gravity, Urine: 1.025 (ref 1.005–1.030)
pH: 5 (ref 5.0–8.0)

## 2020-07-18 LAB — CBC
HCT: 39.3 % (ref 36.0–46.0)
Hemoglobin: 12.7 g/dL (ref 12.0–15.0)
MCH: 30.7 pg (ref 26.0–34.0)
MCHC: 32.3 g/dL (ref 30.0–36.0)
MCV: 94.9 fL (ref 80.0–100.0)
Platelets: 261 10*3/uL (ref 150–400)
RBC: 4.14 MIL/uL (ref 3.87–5.11)
RDW: 13.1 % (ref 11.5–15.5)
WBC: 6.5 10*3/uL (ref 4.0–10.5)
nRBC: 0 % (ref 0.0–0.2)

## 2020-07-18 LAB — BASIC METABOLIC PANEL
Anion gap: 9 (ref 5–15)
BUN: 27 mg/dL — ABNORMAL HIGH (ref 8–23)
CO2: 25 mmol/L (ref 22–32)
Calcium: 8.9 mg/dL (ref 8.9–10.3)
Chloride: 105 mmol/L (ref 98–111)
Creatinine, Ser: 0.88 mg/dL (ref 0.44–1.00)
GFR, Estimated: >60 mL/min (ref >60)
Glucose, Bld: 85 mg/dL (ref 70–99)
Potassium: 4.8 mmol/L (ref 3.5–5.1)
Sodium: 139 mmol/L (ref 135–145)

## 2020-07-18 NOTE — ED Provider Notes (Signed)
[facility] DEPT Provider Note   CSN: [id number] Arrival date & time: 07/18/20  1908     History Chief Complaint  Patient presents with  . Hypertension    Patricia Fitzpatrick is a 75 y.o. female.  Patricia Fitzpatrick presented with a variety of concerns.  She states that she has noticed increased tremors from her Parkinson disease over the past month.  Today, the left leg was especially tremulous when she stood, and she had to use her cane.  She is also taken her blood pressure several times today and noticed that it has been elevated.  She does not take anything for her blood pressure.  The history is provided by the patient.  Neurologic Problem This is a chronic problem. Episode onset: worsening tremors x 1 month and especially today (left leg) The problem occurs daily. The problem has been gradually worsening. Associated symptoms include shortness of breath (chronic- 1 year). Pertinent negatives include no chest pain, no abdominal pain and no headaches. The symptoms are aggravated by standing. Relieved by: using her cane. She has tried nothing for the symptoms. The treatment provided no relief.       Past Medical History:  Diagnosis Date  . Anxiety   . Cataracts, bilateral   . Depression   . High cholesterol   . Hypertension   . OCD (obsessive compulsive disorder)   . 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 Tremor     Patient Active Problem List   Diagnosis Date Noted  . 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 Tremor 10/14/2015  . Memory change 10/14/2015    Past Surgical History:  Procedure Laterality Date  . BREAST BIOPSY  1981  . CATARACT EXTRACTION Right 2017  . EYE SURGERY  1952     OB History   No obstetric history on file.     Family History  Problem Relation Age of Onset  . Pulmonary disease Mother   . Alzheimer's disease Mother   . Heart disease Father   . Alzheimer's disease Father     Social History   Tobacco Use  . Smoking status: Never  Smoker  . Smokeless tobacco: Never Used  Substance Use Topics  . Alcohol use: Yes    Comment: One glass per year.  . Drug use: No    Home Medications Prior to Admission medications   Medication Sig Start Date End Date Taking? Authorizing Provider  Acetaminophen (TYLENOL EXTRA STRENGTH PO) Take by mouth.   Yes [provider]  alendronate (FOSAMAX) 70 MG tablet Take 70 mg by mouth once a week. With 8 ounces of water needs to remain upright for 30 minutes and no food or drink for 30 minutes 09/23/16  Yes [provider]  Ascorbic Acid (VITAMIN C PO) Take 100 mg by mouth 2 (two) times a week.    Yes [provider]  carbidopa-levodopa (SINEMET IR) 25-100 MG tablet Take 1.5 tablets by mouth 3 (three) times daily. 11/01/19  Yes Kathrynn Ducking, MD  Cholecalciferol (VITAMIN D-3 PO) Take 1,000 Units by mouth daily.    Yes [provider]  FLUoxetine (PROZAC) 20 MG capsule Take 80 mg by mouth daily.  01/12/16  Yes [provider]  meloxicam (MOBIC) 7.5 MG tablet Take 7.5 mg by mouth daily.  10/20/17  Yes [provider]  Multiple Vitamin (MULTIVITAMIN) tablet Take 1 tablet by mouth daily.   Yes [provider]  pramipexole (MIRAPEX) 0.5 MG tablet TAKE 1 TABLET(0.5 MG) BY MOUTH  THREE TIMES DAILY 07/11/19  Yes Kathrynn Ducking, MD  QUEtiapine (SEROQUEL) 25 MG tablet TAKE 1 TABLET(25 MG) BY MOUTH AT BEDTIME 04/30/20  Yes Kathrynn Ducking, MD    Allergies    Patient has no known allergies.  Review of Systems   Review of Systems  Constitutional: Negative for chills and fever.  HENT: Negative for ear pain and sore throat.   Eyes: Negative for pain and visual disturbance.  Respiratory: Positive for shortness of breath (chronic- 1 year). Negative for cough.   Cardiovascular: Negative for chest pain and palpitations.  Gastrointestinal: Negative for abdominal pain and vomiting.  Genitourinary: Negative for dysuria and hematuria.   Musculoskeletal: Negative for arthralgias and back pain.  Skin: Negative for color change and rash.  Neurological: Negative for seizures, syncope and headaches.  All other systems reviewed and are negative.   Physical Exam Updated Vital Signs BP (!) 151/75   Pulse 69   Temp 99.3 F (37.4 C) (Oral)   Resp (!) 25   Ht 5' (1.524 m)   Wt 50.8 kg   SpO2 100%   BMI 21.87 kg/m   Physical Exam Vitals and nursing note reviewed.  Constitutional:      General: She is not in acute distress.    Appearance: She is well-developed.  HENT:     Head: Normocephalic and atraumatic.  Eyes:     Conjunctiva/sclera: Conjunctivae normal.  Cardiovascular:     Rate and Rhythm: Normal rate and regular rhythm.     Heart sounds: No murmur heard.   Pulmonary:     Effort: Pulmonary effort is normal. No respiratory distress.     Breath sounds: Normal breath sounds.  Abdominal:     Palpations: Abdomen is soft.     Tenderness: There is no abdominal tenderness.  Musculoskeletal:     Cervical back: Neck supple.  Skin:    General: Skin is warm and dry.  Neurological:     General: No focal deficit present.     Mental Status: She is alert and oriented to person, place, and time.     Cranial Nerves: No cranial nerve deficit.     Sensory: No sensory deficit.     Motor: No weakness.     Coordination: Coordination normal.  Psychiatric:        Mood and Affect: Mood normal.        Behavior: Behavior normal.     ED Results / Procedures / Treatments   Labs (all labs ordered are listed, but only abnormal results are displayed) Labs Reviewed  BASIC METABOLIC PANEL - Abnormal; Notable for the following components:      Result Value   BUN 27 (*)    All other components within normal limits  URINALYSIS, ROUTINE W REFLEX MICROSCOPIC - Abnormal; Notable for the following components:   Hgb urine dipstick SMALL (*)    Ketones, ur 15 (*)    Protein, ur TRACE (*)    Leukocytes,Ua TRACE (*)    All other  components within normal limits  CBC    EKG EKG Interpretation  Date/Time:  Friday July 18 2020 19:35:17 EDT Ventricular Rate:  78 PR Interval:  127 QRS Duration: 91 QT Interval:  415 QTC Calculation: 473 R Axis:   6 Text Interpretation: Sinus rhythm normal axis No acute ischemia Confirmed by Lorre Munroe ([id number]) on 07/18/2020 9:26:05 PM   Radiology CT Head Wo Contrast  Result Date: 07/18/2020 CLINICAL DATA:  Fall yesterday, hit head, worsening Parkinson's  symptoms EXAM: CT HEAD WITHOUT CONTRAST TECHNIQUE: Contiguous axial images were obtained from the base of the skull through the vertex without intravenous contrast. COMPARISON:  08/24/2018 FINDINGS: Brain: No evidence of acute infarction, hemorrhage, hydrocephalus, extra-axial collection or mass lesion/mass effect. Mild periventricular white matter hypodensity. Vascular: No hyperdense vessel or unexpected calcification. Skull: Normal. Negative for fracture or focal lesion. Sinuses/Orbits: No acute finding. Other: None. IMPRESSION: No acute intracranial pathology. Small-vessel white matter disease. Electronically Signed   By: Eddie Candle M.D.   On: 07/18/2020 20:12    Procedures Procedures   Medications Ordered in ED Medications - No data to display  ED Course  I have reviewed the triage vital signs and the nursing notes.  Pertinent labs & imaging results that were available during my care of the patient were reviewed by me and considered in my medical decision making (see chart for details).    MDM Rules/Calculators/A&P                          Patricia Fitzpatrick is well-appearing.  She has had increased tremors in her left leg.  No focal neurodeficits.  I think her symptoms are likely secondary to her Parkinson disease.  Her ED work-up was otherwise reassuring.  Urinalysis was somewhat equivocal, but she has no symptoms of a urinary tract infection.  As far as her blood pressure, I think it would be safest for her to follow with her  primary care doctor prior to abruptly adding any blood pressure medication.  Close neuro follow-up would also be prudent given her increased tremors. Final Clinical Impression(s) / ED Diagnoses Final diagnoses:  Parkinson disease ([place])  Hypertension, unspecified type    Rx / DC Orders ED Discharge Orders    None       Arnaldo Natal, MD 07/18/20 2222

## 2020-07-18 NOTE — ED Notes (Signed)
Pt has been transported to CT at this time

## 2020-07-18 NOTE — ED Triage Notes (Signed)
Pt states that she took her blood pressure was 158/84 at home tonight. She says she feels like her parkinson's has been worse over the past day or two, says she feels more off balance, legs are more shaky. She fell yesterday, hit her head, left hand and left knee, no LOC, no blood thinners.

## 2020-07-22 DIAGNOSIS — R32 Unspecified urinary incontinence: Secondary | ICD-10-CM | POA: Diagnosis not present

## 2020-07-22 DIAGNOSIS — F419 Anxiety disorder, unspecified: Secondary | ICD-10-CM | POA: Diagnosis not present

## 2020-07-22 DIAGNOSIS — Z9181 History of falling: Secondary | ICD-10-CM | POA: Diagnosis not present

## 2020-07-22 DIAGNOSIS — M810 Age-related osteoporosis without current pathological fracture: Secondary | ICD-10-CM | POA: Diagnosis not present

## 2020-07-22 DIAGNOSIS — R443 Hallucinations, unspecified: Secondary | ICD-10-CM | POA: Diagnosis not present

## 2020-07-22 DIAGNOSIS — R2689 Other abnormalities of gait and mobility: Secondary | ICD-10-CM | POA: Diagnosis not present

## 2020-07-22 DIAGNOSIS — E785 Hyperlipidemia, unspecified: Secondary | ICD-10-CM | POA: Diagnosis not present

## 2020-07-22 DIAGNOSIS — D692 Other nonthrombocytopenic purpura: Secondary | ICD-10-CM | POA: Diagnosis not present

## 2020-07-22 DIAGNOSIS — R627 Adult failure to thrive: Secondary | ICD-10-CM | POA: Diagnosis not present

## 2020-07-22 DIAGNOSIS — I10 Essential (primary) hypertension: Secondary | ICD-10-CM | POA: Diagnosis not present

## 2020-07-22 DIAGNOSIS — G20 Parkinson's disease: Secondary | ICD-10-CM | POA: Diagnosis not present

## 2020-07-22 DIAGNOSIS — R413 Other amnesia: Secondary | ICD-10-CM | POA: Diagnosis not present

## 2020-08-05 ENCOUNTER — Encounter: Payer: Self-pay | Admitting: Neurology

## 2020-08-05 ENCOUNTER — Ambulatory Visit: Payer: PPO | Admitting: Neurology

## 2020-08-05 VITALS — BP 148/81 | HR 75 | Ht 59.0 in | Wt 111.8 lb

## 2020-08-05 DIAGNOSIS — G20 Parkinson's disease: Secondary | ICD-10-CM

## 2020-08-05 DIAGNOSIS — R413 Other amnesia: Secondary | ICD-10-CM

## 2020-08-05 MED ORDER — QUETIAPINE FUMARATE 25 MG PO TABS: ORAL_TABLET | ORAL | 3 refills | Status: DC

## 2020-08-05 MED ORDER — PRAMIPEXOLE DIHYDROCHLORIDE 0.5 MG PO TABS: ORAL_TABLET | ORAL | 3 refills | Status: DC

## 2020-08-05 NOTE — Progress Notes (Signed)
Reason for visit: Parkinsonism, memory disturbance  Patricia Fitzpatrick is an 75 y.o. female  History of present illness:  Patricia Fitzpatrick is a 76 year old right-handed white female with a history of Parkinson's disease.  The patient does have a mild memory disturbance.  She lives at home with her brother and her sister-in-law.  The patient is able to drive a car but she does not drive much.  She likes to cook, she keeps track of her own medications and appointments.  She does miss her Sinemet dose at times.  She has had some increasing problems with gait instability.  She fell on 28 April 2020 and went to the emergency room.  She went to the ER on 18 July 2020 with increased tremors of the left leg.  She had been on Wellbutrin around that time and she realized that this was making her nervous and jittery and she has stopped the medication.  The patient however continues to fall, she has had 4 or 5 falls over the last 2 months.  She may have a tendency to lean backwards a bit.  When she is walking, sometimes she will veer to the right.  She has had a recent CT scan of the brain.  She does have hallucinations off and on but this is not frequent, this occurs about once a week.  She uses Seroquel at night.  She returns to the office today for further evaluation.  She is on Mirapex taking 0.5 mg 3 times daily and she is on Sinemet 1.5 tablets of the 25/100 mg Sinemet, 3 times daily.  Past Medical History:  Diagnosis Date   Anxiety    Cataracts, bilateral    Depression    High cholesterol    Hypertension    OCD (obsessive compulsive disorder)    Parkinsonism ([place]) 10/14/2015   Tremor     Past Surgical History:  Procedure Laterality Date   BREAST BIOPSY  1981   CATARACT EXTRACTION Right 2017   EYE SURGERY  1952    Family History  Problem Relation Age of Onset   Pulmonary disease Mother    Alzheimer's disease Mother    Heart disease Father    Alzheimer's disease Father     Social history:  reports  that she has never smoked. She has never used smokeless tobacco. She reports current alcohol use. She reports that she does not use drugs.   No Known Allergies  Medications:  Prior to Admission medications   Medication Sig Start Date End Date Taking? Authorizing Provider  Acetaminophen (TYLENOL EXTRA STRENGTH PO) Take by mouth.   Yes [provider]  alendronate (FOSAMAX) 70 MG tablet Take 70 mg by mouth once a week. With 8 ounces of water needs to remain upright for 30 minutes and no food or drink for 30 minutes 09/23/16  Yes [provider]  Ascorbic Acid (VITAMIN C PO) Take 100 mg by mouth 2 (two) times a week.    Yes [provider]  carbidopa-levodopa (SINEMET IR) 25-100 MG tablet Take 1.5 tablets by mouth 3 (three) times daily. 11/01/19  Yes Kathrynn Ducking, MD  Cholecalciferol (VITAMIN D-3 PO) Take 1,000 Units by mouth daily.    Yes [provider]  FLUoxetine (PROZAC) 20 MG capsule Take 80 mg by mouth daily.  01/12/16  Yes [provider]  meloxicam (MOBIC) 7.5 MG tablet Take 7.5 mg by mouth as needed. 10/20/17  Yes [provider]  Multiple Vitamin (MULTIVITAMIN) tablet  Take 1 tablet by mouth daily.   Yes [provider]  pramipexole (MIRAPEX) 0.5 MG tablet TAKE 1 TABLET(0.5 MG) BY MOUTH THREE TIMES DAILY 07/11/19  Yes Kathrynn Ducking, MD  QUEtiapine (SEROQUEL) 25 MG tablet TAKE 1 TABLET(25 MG) BY MOUTH AT BEDTIME 04/30/20  Yes Kathrynn Ducking, MD    ROS:  Out of a complete 14 system review of symptoms, the patient complains only of the following symptoms, and all other reviewed systems are negative.  Memory problems Walking difficulty, falls  Blood pressure (!) 148/81, pulse 75, height 4\' 11"  (1.499 m), weight 111 lb 12.8 oz (50.7 kg).  Physical Exam  General: The patient is alert and cooperative at the time of the examination.  Skin: No significant peripheral edema is noted.   Neurologic Exam  Mental status:  The patient is alert and oriented x 3 at the time of the examination. The Mini-Mental status examination done today shows total score of 26/30.   Cranial nerves: Facial symmetry is present. Speech is normal, no aphasia or dysarthria is noted. Extraocular movements are full. Visual fields are full.  Masking of the face is seen.  Motor: The patient has good strength in all 4 extremities.  Sensory examination: Soft touch sensation is symmetric on the face, arms, and legs.  Coordination: The patient has good finger-nose-finger and heel-to-shin bilaterally.  Gait and station: The patient has the ability to rise from a seated position with arms crossed with some difficulty, she has a tendency to lean backwards when she stands up.  The patient is able to walk independently, symmetric but decreased arm swing bilaterally.  No tremors are noted.  The patient has fairly good turns.  Tandem gait is slightly unsteady.  Romberg is negative.  Reflexes: Deep tendon reflexes are symmetric.   CT head 07/18/20:  IMPRESSION: No acute intracranial pathology. Small-vessel white matter disease.  * CT scan images were reviewed online. I agree with the written report.    Assessment/Plan:  1.  Parkinson's disease  2.  Gait disturbance, falls  3.  Memory disturbance  4.  Hallucinations  I have encouraged the patient to enter into physical therapy, she does not wish to do it at this point.  She may benefit as well from a memory medication, we will follow the memory and consider addition of a medication in the future.  The patient will call me if she is amenable to entering into physical therapy.  I do not think that increasing the Sinemet dose at this point is likely to help her balance.  She is not reporting any freezing episodes, she denies any dizziness with standing.  She will follow-up in 5 months.  She may see Dr. Rexene Fitzpatrick in the future.  Patricia Alexanders MD 08/05/2020 8:15 AM  [facility] [address] [place], [place] [address]  Phone [phone] Fax [phone]

## 2020-08-07 DIAGNOSIS — Z125 Encounter for screening for malignant neoplasm of prostate: Secondary | ICD-10-CM | POA: Diagnosis not present

## 2020-08-07 DIAGNOSIS — Z0000 Encounter for general adult medical examination without abnormal findings: Secondary | ICD-10-CM | POA: Diagnosis not present

## 2020-08-07 DIAGNOSIS — M810 Age-related osteoporosis without current pathological fracture: Secondary | ICD-10-CM | POA: Diagnosis not present

## 2020-08-07 DIAGNOSIS — E785 Hyperlipidemia, unspecified: Secondary | ICD-10-CM | POA: Diagnosis not present

## 2020-08-14 DIAGNOSIS — Z0000 Encounter for general adult medical examination without abnormal findings: Secondary | ICD-10-CM | POA: Diagnosis not present

## 2020-08-14 DIAGNOSIS — G20 Parkinson's disease: Secondary | ICD-10-CM | POA: Diagnosis not present

## 2020-08-14 DIAGNOSIS — Z9181 History of falling: Secondary | ICD-10-CM | POA: Diagnosis not present

## 2020-08-14 DIAGNOSIS — I10 Essential (primary) hypertension: Secondary | ICD-10-CM | POA: Diagnosis not present

## 2020-08-14 DIAGNOSIS — R2689 Other abnormalities of gait and mobility: Secondary | ICD-10-CM | POA: Diagnosis not present

## 2020-08-14 DIAGNOSIS — F329 Major depressive disorder, single episode, unspecified: Secondary | ICD-10-CM | POA: Diagnosis not present

## 2020-08-14 DIAGNOSIS — D692 Other nonthrombocytopenic purpura: Secondary | ICD-10-CM | POA: Diagnosis not present

## 2020-08-14 DIAGNOSIS — Z1339 Encounter for screening examination for other mental health and behavioral disorders: Secondary | ICD-10-CM | POA: Diagnosis not present

## 2020-08-14 DIAGNOSIS — Z1331 Encounter for screening for depression: Secondary | ICD-10-CM | POA: Diagnosis not present

## 2020-08-14 DIAGNOSIS — M810 Age-related osteoporosis without current pathological fracture: Secondary | ICD-10-CM | POA: Diagnosis not present

## 2020-08-14 DIAGNOSIS — I700 Atherosclerosis of aorta: Secondary | ICD-10-CM | POA: Diagnosis not present

## 2020-08-14 DIAGNOSIS — R627 Adult failure to thrive: Secondary | ICD-10-CM | POA: Diagnosis not present

## 2020-08-14 DIAGNOSIS — E785 Hyperlipidemia, unspecified: Secondary | ICD-10-CM | POA: Diagnosis not present

## 2020-08-14 DIAGNOSIS — F419 Anxiety disorder, unspecified: Secondary | ICD-10-CM | POA: Diagnosis not present

## 2020-08-19 DIAGNOSIS — M810 Age-related osteoporosis without current pathological fracture: Secondary | ICD-10-CM | POA: Diagnosis not present

## 2020-08-22 ENCOUNTER — Other Ambulatory Visit: Payer: Self-pay

## 2020-08-22 ENCOUNTER — Encounter (HOSPITAL_COMMUNITY): Payer: Self-pay | Admitting: Emergency Medicine

## 2020-08-22 ENCOUNTER — Emergency Department (HOSPITAL_COMMUNITY): Payer: PPO

## 2020-08-22 ENCOUNTER — Inpatient Hospital Stay (HOSPITAL_COMMUNITY)
Admission: EM | Admit: 2020-08-22 | Discharge: 2020-08-25 | DRG: 522 | Disposition: A | Discharge status: Skilled Nursing Facility | Payer: PPO | Attending: Internal Medicine | Admitting: Internal Medicine

## 2020-08-22 DIAGNOSIS — W010XXA Fall on same level from slipping, tripping and stumbling without subsequent striking against object, initial encounter: Secondary | ICD-10-CM | POA: Diagnosis present

## 2020-08-22 DIAGNOSIS — I10 Essential (primary) hypertension: Secondary | ICD-10-CM | POA: Diagnosis not present

## 2020-08-22 DIAGNOSIS — I159 Secondary hypertension, unspecified: Secondary | ICD-10-CM | POA: Diagnosis not present

## 2020-08-22 DIAGNOSIS — F32A Depression, unspecified: Secondary | ICD-10-CM | POA: Diagnosis not present

## 2020-08-22 DIAGNOSIS — M25552 Pain in left hip: Secondary | ICD-10-CM | POA: Diagnosis not present

## 2020-08-22 DIAGNOSIS — Z9181 History of falling: Secondary | ICD-10-CM | POA: Diagnosis not present

## 2020-08-22 DIAGNOSIS — F418 Other specified anxiety disorders: Secondary | ICD-10-CM | POA: Diagnosis not present

## 2020-08-22 DIAGNOSIS — E7800 Pure hypercholesterolemia, unspecified: Secondary | ICD-10-CM | POA: Diagnosis not present

## 2020-08-22 DIAGNOSIS — M810 Age-related osteoporosis without current pathological fracture: Secondary | ICD-10-CM

## 2020-08-22 DIAGNOSIS — Y92009 Unspecified place in unspecified non-institutional (private) residence as the place of occurrence of the external cause: Secondary | ICD-10-CM | POA: Diagnosis not present

## 2020-08-22 DIAGNOSIS — Z9841 Cataract extraction status, right eye: Secondary | ICD-10-CM

## 2020-08-22 DIAGNOSIS — Z7401 Bed confinement status: Secondary | ICD-10-CM | POA: Diagnosis not present

## 2020-08-22 DIAGNOSIS — Z791 Long term (current) use of non-steroidal anti-inflammatories (NSAID): Secondary | ICD-10-CM | POA: Diagnosis not present

## 2020-08-22 DIAGNOSIS — R5381 Other malaise: Secondary | ICD-10-CM | POA: Diagnosis not present

## 2020-08-22 DIAGNOSIS — Z20822 Contact with and (suspected) exposure to covid-19: Secondary | ICD-10-CM | POA: Diagnosis present

## 2020-08-22 DIAGNOSIS — Z09 Encounter for follow-up examination after completed treatment for conditions other than malignant neoplasm: Secondary | ICD-10-CM

## 2020-08-22 DIAGNOSIS — S0990XA Unspecified injury of head, initial encounter: Secondary | ICD-10-CM | POA: Diagnosis not present

## 2020-08-22 DIAGNOSIS — F429 Obsessive-compulsive disorder, unspecified: Secondary | ICD-10-CM | POA: Diagnosis present

## 2020-08-22 DIAGNOSIS — R410 Disorientation, unspecified: Secondary | ICD-10-CM

## 2020-08-22 DIAGNOSIS — Z820 Family history of epilepsy and other diseases of the nervous system: Secondary | ICD-10-CM

## 2020-08-22 DIAGNOSIS — R2681 Unsteadiness on feet: Secondary | ICD-10-CM | POA: Diagnosis not present

## 2020-08-22 DIAGNOSIS — Z8249 Family history of ischemic heart disease and other diseases of the circulatory system: Secondary | ICD-10-CM

## 2020-08-22 DIAGNOSIS — S72002D Fracture of unspecified part of neck of left femur, subsequent encounter for closed fracture with routine healing: Secondary | ICD-10-CM | POA: Diagnosis not present

## 2020-08-22 DIAGNOSIS — G20 Parkinson's disease: Secondary | ICD-10-CM | POA: Diagnosis not present

## 2020-08-22 DIAGNOSIS — Z471 Aftercare following joint replacement surgery: Secondary | ICD-10-CM | POA: Diagnosis not present

## 2020-08-22 DIAGNOSIS — Z79899 Other long term (current) drug therapy: Secondary | ICD-10-CM | POA: Diagnosis not present

## 2020-08-22 DIAGNOSIS — F419 Anxiety disorder, unspecified: Secondary | ICD-10-CM | POA: Diagnosis present

## 2020-08-22 DIAGNOSIS — W19XXXA Unspecified fall, initial encounter: Secondary | ICD-10-CM | POA: Diagnosis not present

## 2020-08-22 DIAGNOSIS — Z96642 Presence of left artificial hip joint: Secondary | ICD-10-CM | POA: Diagnosis not present

## 2020-08-22 DIAGNOSIS — M6281 Muscle weakness (generalized): Secondary | ICD-10-CM | POA: Diagnosis not present

## 2020-08-22 DIAGNOSIS — S72012A Unspecified intracapsular fracture of left femur, initial encounter for closed fracture: Secondary | ICD-10-CM | POA: Diagnosis not present

## 2020-08-22 DIAGNOSIS — G20A1 Parkinson's disease without dyskinesia, without mention of fluctuations: Secondary | ICD-10-CM | POA: Diagnosis present

## 2020-08-22 DIAGNOSIS — R531 Weakness: Secondary | ICD-10-CM | POA: Diagnosis not present

## 2020-08-22 DIAGNOSIS — S72002A Fracture of unspecified part of neck of left femur, initial encounter for closed fracture: Secondary | ICD-10-CM

## 2020-08-22 DIAGNOSIS — S72112A Displaced fracture of greater trochanter of left femur, initial encounter for closed fracture: Secondary | ICD-10-CM | POA: Diagnosis present

## 2020-08-22 DIAGNOSIS — U071 COVID-19: Secondary | ICD-10-CM | POA: Diagnosis not present

## 2020-08-22 DIAGNOSIS — R5383 Other fatigue: Secondary | ICD-10-CM | POA: Diagnosis not present

## 2020-08-22 DIAGNOSIS — R41841 Cognitive communication deficit: Secondary | ICD-10-CM | POA: Diagnosis not present

## 2020-08-22 LAB — CBC WITH DIFFERENTIAL/PLATELET
Abs Immature Granulocytes: 0.08 10*3/uL — ABNORMAL HIGH (ref 0.00–0.07)
Basophils Absolute: 0 10*3/uL (ref 0.0–0.1)
Basophils Relative: 0 %
Eosinophils Absolute: 0 10*3/uL (ref 0.0–0.5)
Eosinophils Relative: 0 %
HCT: 43.4 % (ref 36.0–46.0)
Hemoglobin: 14.2 g/dL (ref 12.0–15.0)
Immature Granulocytes: 1 %
Lymphocytes Relative: 5 %
Lymphs Abs: 0.6 10*3/uL — ABNORMAL LOW (ref 0.7–4.0)
MCH: 31.1 pg (ref 26.0–34.0)
MCHC: 32.7 g/dL (ref 30.0–36.0)
MCV: 95 fL (ref 80.0–100.0)
Monocytes Absolute: 0.6 10*3/uL (ref 0.1–1.0)
Monocytes Relative: 5 %
Neutro Abs: 10.8 10*3/uL — ABNORMAL HIGH (ref 1.7–7.7)
Neutrophils Relative %: 89 %
Platelets: 293 10*3/uL (ref 150–400)
RBC: 4.57 MIL/uL (ref 3.87–5.11)
RDW: 12.5 % (ref 11.5–15.5)
WBC: 12.1 10*3/uL — ABNORMAL HIGH (ref 4.0–10.5)
nRBC: 0 % (ref 0.0–0.2)

## 2020-08-22 LAB — COMPREHENSIVE METABOLIC PANEL
ALT: 6 U/L (ref 0–44)
AST: 28 U/L (ref 15–41)
Albumin: 4.1 g/dL (ref 3.5–5.0)
Alkaline Phosphatase: 60 U/L (ref 38–126)
Anion gap: 7 (ref 5–15)
BUN: 22 mg/dL (ref 8–23)
CO2: 24 mmol/L (ref 22–32)
Calcium: 9.2 mg/dL (ref 8.9–10.3)
Chloride: 107 mmol/L (ref 98–111)
Creatinine, Ser: 0.8 mg/dL (ref 0.44–1.00)
GFR, Estimated: >60 mL/min (ref >60)
Glucose, Bld: 89 mg/dL (ref 70–99)
Potassium: 4.5 mmol/L (ref 3.5–5.1)
Sodium: 138 mmol/L (ref 135–145)
Total Bilirubin: 0.8 mg/dL (ref 0.3–1.2)
Total Protein: 6.9 g/dL (ref 6.5–8.1)

## 2020-08-22 LAB — RESP PANEL BY RT-PCR (FLU A&B, COVID) ARPGX2
Influenza A by PCR: NEGATIVE
Influenza B by PCR: NEGATIVE
SARS Coronavirus 2 by RT PCR: NEGATIVE

## 2020-08-22 LAB — PROTIME-INR
INR: 0.9 (ref 0.8–1.2)
Prothrombin Time: 12.6 seconds (ref 11.4–15.2)

## 2020-08-22 LAB — TYPE AND SCREEN
ABO/RH(D): A POS
Antibody Screen: NEGATIVE

## 2020-08-22 LAB — ABO/RH: ABO/RH(D): A POS

## 2020-08-22 MED ORDER — FENTANYL CITRATE (PF) 100 MCG/2ML IJ SOLN
50.0000 ug | Freq: Once | INTRAMUSCULAR | Status: AC
  Administered 2020-08-22: 50 ug via INTRAMUSCULAR
  Filled 2020-08-22: qty 2

## 2020-08-22 MED ORDER — MORPHINE SULFATE (PF) 2 MG/ML IV SOLN
2.0000 mg | INTRAVENOUS | Status: DC | PRN
  Administered 2020-08-22 (×2): 2 mg via INTRAVENOUS
  Filled 2020-08-22 (×3): qty 1

## 2020-08-22 MED ORDER — SODIUM CHLORIDE 0.9% FLUSH
3.0000 mL | Freq: Two times a day (BID) | INTRAVENOUS | Status: DC
  Administered 2020-08-24 (×2): 3 mL via INTRAVENOUS

## 2020-08-22 MED ORDER — SENNOSIDES-DOCUSATE SODIUM 8.6-50 MG PO TABS: 1.0000 | ORAL_TABLET | Freq: Every day | ORAL | Status: DC

## 2020-08-22 MED ORDER — FENTANYL CITRATE (PF) 100 MCG/2ML IJ SOLN
50.0000 ug | INTRAMUSCULAR | Status: AC | PRN
  Administered 2020-08-22 (×2): 50 ug via INTRAVENOUS
  Filled 2020-08-22 (×2): qty 2

## 2020-08-22 MED ORDER — SODIUM CHLORIDE 0.9 % IV SOLN: INTRAVENOUS | Status: DC

## 2020-08-22 MED ORDER — ACETAMINOPHEN 325 MG PO TABS: 650.0000 mg | ORAL_TABLET | ORAL | Status: DC | PRN

## 2020-08-22 NOTE — Assessment & Plan Note (Addendum)
-   s/p mechanical fall at home; does not sound like any syncope or prodrome of such; med list reviewed and seems appropriate for her level of Parkinson's as well - s/p left hip hemiarthroplasty on 08/23/2020 - appreciate PT/OT evals. Rec is SNF - Continue pain control - continue lovenox for DVT ppx at discharge; 30 day prescription provided by ortho at discharge

## 2020-08-22 NOTE — H&P (Signed)
History and Physical    ANDREAH GOHEEN  IRS:[id number]  DOB: Feb 27, 1945  DOA: 08/22/2020  PCP: Shon Baton, MD Patient coming from: home  Chief Complaint: fall  HPI:  Ms. Wismer is a 75 yo female with PMH Parkinson's disease, osteoporosis, depression/anxiety, hypertension, hyperlipidemia who presented to the hospital after a mechanical accidental fall at home.  She states she was working on making breakfast this morning when she saw a bug on the floor and attempted to throw a piece of bread at it and somehow lost her footing and fell onto her left side on the floor.  X-ray obtained in the ER showed and acute subcapital left femoral neck fracture with mild impaction and internal rotation of the distal fracture fragments. CT head was also obtained which was unremarkable.  She is admitted for orthopedic surgery evaluation for tentative repair.  I have personally briefly reviewed patient's old medical records in [facility] and discussed patient with the ER provider when appropriate/indicated.  Assessment/Plan: Fracture of femoral neck, left (HCC) - s/p mechanical fall at home; does not sound like any syncope or prodrome of such; med list reviewed and seems appropriate for her level of Parkinson's as well - ortho eval pending; patient to remain NPO for now; if no plans for surgery today, then will resume diet - patient independently functioning prior to fall.  Per NSQIP, she is below average risk in all fields. Per RCRI also Class 1 low risk. Will have PT/OT eval after surgery and plan to start DVT ppx at that time as well  - continue pain control   Osteoporosis - Previously on Fosamax.  She states that she is about to start Prolia outpatient instead  Depression - Resume home meds after med rec complete  Hypertension - Not on home medications.  Blood pressure controlled.  Treat pain first for any BP elevations  Parkinsonism ([place]) - Resume home meds after med rec complete     Code  Status: Full   DVT Prophylaxis: SCD     Anticipated disposition is to: home vs ST rehab  History: Past Medical History:  Diagnosis Date   Anxiety    Cataracts, bilateral    Depression    High cholesterol    Hypertension    OCD (obsessive compulsive disorder)    Parkinsonism ([place]) 10/14/2015   Tremor     Past Surgical History:  Procedure Laterality Date   BREAST BIOPSY  1981   CATARACT EXTRACTION Right 2017   [place]     reports that she has never smoked. She has never used smokeless tobacco. She reports current alcohol use. She reports that she does not use drugs.  No Known Allergies  Family History  Problem Relation Age of Onset   Pulmonary disease Mother    Alzheimer's disease Mother    Heart disease Father    Alzheimer's disease Father    Home Medications: Prior to Admission medications   Medication Sig Start Date End Date Taking? Authorizing Provider  Acetaminophen (TYLENOL EXTRA STRENGTH PO) Take by mouth.    [provider]  alendronate (FOSAMAX) 70 MG tablet Take 70 mg by mouth once a week. With 8 ounces of water needs to remain upright for 30 minutes and no food or drink for 30 minutes 09/23/16   [provider]  Ascorbic Acid (VITAMIN C PO) Take 100 mg by mouth 2 (two) times a week.     [provider]  carbidopa-levodopa (SINEMET IR) 25-100  MG tablet Take 1.5 tablets by mouth 3 (three) times daily. 11/01/19   Kathrynn Ducking, MD  Cholecalciferol (VITAMIN D-3 PO) Take 1,000 Units by mouth daily.     [provider]  FLUoxetine (PROZAC) 20 MG capsule Take 80 mg by mouth daily.  01/12/16   [provider]  meloxicam (MOBIC) 7.5 MG tablet Take 7.5 mg by mouth as needed. 10/20/17   [provider]  Multiple Vitamin (MULTIVITAMIN) tablet Take 1 tablet by mouth daily.    [provider]  pramipexole (MIRAPEX) 0.5 MG tablet TAKE 1 TABLET(0.5 MG) BY MOUTH THREE TIMES DAILY 08/05/20   Kathrynn Ducking,  MD  QUEtiapine (SEROQUEL) 25 MG tablet TAKE 1 TABLET(25 MG) BY MOUTH AT BEDTIME 08/05/20   Kathrynn Ducking, MD    Review of Systems:  Pertinent items noted in HPI and remainder of comprehensive ROS otherwise negative.  Physical Exam: Vitals:   08/22/20 1148 08/22/20 1400 08/22/20 1500  BP: (!) 141/78 129/67 123/81  Pulse: 75 72 76  Resp: 18 18 18   Temp: 97.9 F (36.6 C)    TempSrc: Oral    SpO2: 96% 96% 97%   General appearance: alert, cooperative, and no distress Head: Normocephalic, without obvious abnormality, atraumatic Eyes:  EOMI Lungs: clear to auscultation bilaterally Heart: regular rate and rhythm and S1, S2 normal Abdomen: normal findings: bowel sounds normal and soft, non-tender Extremities:  pain in left hip on palpation, no obvious swelling; soft compartments Skin: mobility and turgor normal Neurologic: LLE limited by pain; no other focal deficits otherwise  Labs on Admission:  I have personally reviewed following labs and imaging studies Results for orders placed or performed during the hospital encounter of 08/22/20 (from the past 24 hour(s))  CBC WITH DIFFERENTIAL     Status: Abnormal   Collection Time: 08/22/20  1:29 PM  Result Value Ref Range   WBC 12.1 (H) 4.0 - 10.5 K/uL   RBC 4.57 3.87 - 5.11 MIL/uL   Hemoglobin 14.2 12.0 - 15.0 g/dL   HCT 43.4 36.0 - 46.0 %   MCV 95.0 80.0 - 100.0 fL   MCH 31.1 26.0 - 34.0 pg   MCHC 32.7 30.0 - 36.0 g/dL   RDW 12.5 11.5 - 15.5 %   Platelets 293 150 - 400 K/uL   nRBC 0.0 0.0 - 0.2 %   Neutrophils Relative % 89 %   Neutro Abs 10.8 (H) 1.7 - 7.7 K/uL   Lymphocytes Relative 5 %   Lymphs Abs 0.6 (L) 0.7 - 4.0 K/uL   Monocytes Relative 5 %   Monocytes Absolute 0.6 0.1 - 1.0 K/uL   Eosinophils Relative 0 %   Eosinophils Absolute 0.0 0.0 - 0.5 K/uL   Basophils Relative 0 %   Basophils Absolute 0.0 0.0 - 0.1 K/uL   Immature Granulocytes 1 %   Abs Immature Granulocytes 0.08 (H) 0.00 - 0.07 K/uL  Protime-INR      Status: None   Collection Time: 08/22/20  1:29 PM  Result Value Ref Range   Prothrombin Time 12.6 11.4 - 15.2 seconds   INR 0.9 0.8 - 1.2  Comprehensive metabolic panel     Status: None   Collection Time: 08/22/20  1:29 PM  Result Value Ref Range   Sodium 138 135 - 145 mmol/L   Potassium 4.5 3.5 - 5.1 mmol/L   Chloride 107 98 - 111 mmol/L   CO2 24 22 - 32 mmol/L   Glucose, Bld 89 70 - 99  mg/dL   BUN 22 8 - 23 mg/dL   Creatinine, Ser 0.80 0.44 - 1.00 mg/dL   Calcium 9.2 8.9 - 10.3 mg/dL   Total Protein 6.9 6.5 - 8.1 g/dL   Albumin 4.1 3.5 - 5.0 g/dL   AST 28 15 - 41 U/L   ALT 6 0 - 44 U/L   Alkaline Phosphatase 60 38 - 126 U/L   Total Bilirubin 0.8 0.3 - 1.2 mg/dL   GFR, Estimated >60 >60 mL/min   Anion gap 7 5 - 15     Radiological Exams on Admission: CT Head Wo Contrast  Result Date: 08/22/2020 CLINICAL DATA:  Head injury after fall. EXAM: CT HEAD WITHOUT CONTRAST TECHNIQUE: Contiguous axial images were obtained from the base of the skull through the vertex without intravenous contrast. COMPARISON:  July 18, 2020. FINDINGS: Brain: No evidence of acute infarction, hemorrhage, hydrocephalus, extra-axial collection or mass lesion/mass effect. Vascular: No hyperdense vessel or unexpected calcification. Skull: Normal. Negative for fracture or focal lesion. Sinuses/Orbits: No acute finding. Other: None. IMPRESSION: No acute intracranial abnormality seen. Electronically Signed   By: Marijo Conception M.D.   On: 08/22/2020 13:52   DG Hip Unilat With Pelvis 2-3 Views Left  Result Date: 08/22/2020 CLINICAL DATA:  Status post fall. EXAM: DG HIP (WITH OR WITHOUT PELVIS) 2-3V LEFT COMPARISON:  08/26/2018 FINDINGS: Acute subcapital left femoral neck fracture is identified. Mild impaction. Mild internal rotation of the distal fracture fragments. Healed fracture deformity involving the right pubic bone at the symphysis. Right hip appears intact. IMPRESSION: Acute subcapital left femoral neck fracture  with mild impaction and internal rotation of the distal fracture fragments. Electronically Signed   By: Kerby Moors M.D.   On: 08/22/2020 13:36   CT Head Wo Contrast  Final Result    DG Hip Unilat With Pelvis 2-3 Views Left  Final Result    DG Hip Port Unilat W or Wo Pelvis 1 View Left    (Results Pending)    Consults called:  Ortho   EKG: Independently reviewed. NSR, Qtc 470   Dwyane Dee, MD [facility] 08/22/2020, 3:48 PM

## 2020-08-22 NOTE — Plan of Care (Signed)
?  Problem: Nutrition: ?Goal: Adequate nutrition will be maintained ?Outcome: Progressing ?  ?Problem: Coping: ?Goal: Level of anxiety will decrease ?Outcome: Progressing ?  ?Problem: Elimination: ?Goal: Will not experience complications related to urinary retention ?Outcome: Progressing ?  ?

## 2020-08-22 NOTE — Anesthesia Preprocedure Evaluation (Addendum)
Anesthesia Evaluation  Patient identified by MRN, date of birth, ID band Patient awake    Reviewed: Allergy & Precautions, NPO status , Patient's Chart, lab work & pertinent test results  History of Anesthesia Complications Negative for: history of anesthetic complications  Airway Mallampati: II  TM Distance: >3 FB Neck ROM: Full    Dental  (+) Dental Advisory Given, Chipped   Pulmonary neg pulmonary ROS,    Pulmonary exam normal        Cardiovascular hypertension (no meds), Normal cardiovascular exam     Neuro/Psych PSYCHIATRIC DISORDERS Anxiety Depression  OCD  Parkinsonism     GI/Hepatic negative GI ROS, Neg liver ROS,   Endo/Other  negative endocrine ROS  Renal/GU negative Renal ROS     Musculoskeletal negative musculoskeletal ROS (+)   Abdominal   Peds  Hematology negative hematology ROS (+)   Anesthesia Other Findings Covid test negative   Reproductive/Obstetrics                            Anesthesia Physical Anesthesia Plan  ASA: 3  Anesthesia Plan: General   Post-op Pain Management:    Induction: Intravenous  PONV Risk Score and Plan: 3 and Treatment may vary due to age or medical condition, Ondansetron, Propofol infusion and Aprepitant  Airway Management Planned: Oral ETT  Additional Equipment: None  Intra-op Plan:   Post-operative Plan: Extubation in OR  Informed Consent: I have reviewed the patients History and Physical, chart, labs and discussed the procedure including the risks, benefits and alternatives for the proposed anesthesia with the patient or authorized representative who has indicated his/her understanding and acceptance.     Dental advisory given  Plan Discussed with: CRNA and Anesthesiologist  Anesthesia Plan Comments:        Anesthesia Quick Evaluation

## 2020-08-22 NOTE — Assessment & Plan Note (Addendum)
Continue Sinemet ?

## 2020-08-22 NOTE — Assessment & Plan Note (Addendum)
-   Continue Seroquel and Prozac

## 2020-08-22 NOTE — Hospital Course (Addendum)
Ms. Mukherjee is a 75 yo female with PMH Parkinson's disease, osteoporosis, depression/anxiety, hypertension, hyperlipidemia who presented to the hospital after a mechanical accidental fall at home.  She states she was working on making breakfast this morning when she saw a bug on the floor and attempted to throw a piece of bread at it and somehow lost her footing and fell onto her left side on the floor.  X-ray obtained in the ER showed and acute subcapital left femoral neck fracture with mild impaction and internal rotation of the distal fracture fragments. CT head was also obtained which was unremarkable.  She is admitted for orthopedic surgery evaluation for tentative repair. She was evaluated and taken to the OR on 08/23/2020 and underwent left hip hemiarthroplasty.  She tolerated surgery well.

## 2020-08-22 NOTE — ED Triage Notes (Signed)
Per EMS-mechanical fall at home-history of Parkinson's-complaining of left hip pain-was able to ambulate with assistance-1/10 pain-some shortening noted

## 2020-08-22 NOTE — ED Provider Notes (Signed)
[facility] DEPT Provider Note   CSN: [id number] Arrival date & time: 08/22/20  1135     History Chief Complaint  Patient presents with   Lytle Michaels    Patricia Fitzpatrick is a 75 y.o. female.  HPI      75yo female with history of Parkinsonism, depression, htn, hlpd presents with concern for fall with left hip pain.  Reports she was making breakfast when a bug fell on the floor and she threw a piece of bread on it and as she was doing that fell down.  Fell onto her left side. She was able to get up and hobble but not able to bear weight.  Reports she did hit her head.  No LOC. Denies other injuries, no neck or back pain.     Past Medical History:  Diagnosis Date   Anxiety    Cataracts, bilateral    Depression    High cholesterol    Hypertension    OCD (obsessive compulsive disorder)    Parkinsonism ([place]) 10/14/2015   Tremor     Patient Active Problem List   Diagnosis Date Noted   Fracture of femoral neck, left ([place]) 08/22/2020   Hypertension    Depression    Osteoporosis    Parkinsonism ([place]) 10/14/2015   Tremor 10/14/2015   Memory change 10/14/2015    Past Surgical History:  Procedure Laterality Date   BREAST BIOPSY  1981   CATARACT EXTRACTION Right 2017   EYE SURGERY  1952     OB History   No obstetric history on file.     Family History  Problem Relation Age of Onset   Pulmonary disease Mother    Alzheimer's disease Mother    Heart disease Father    Alzheimer's disease Father     Social History   Tobacco Use   Smoking status: Never   Smokeless tobacco: Never  Substance Use Topics   Alcohol use: Yes    Comment: One glass per year.   Drug use: No    Home Medications Prior to Admission medications   Medication Sig Start Date End Date Taking? Authorizing Provider  acetaminophen (TYLENOL) 500 MG tablet Take 500 mg by mouth every 6 (six) hours as needed for moderate pain.   Yes [provider]  alendronate  (FOSAMAX) 70 MG tablet Take 70 mg by mouth once a week. With 8 ounces of water needs to remain upright for 30 minutes and no food or drink for 30 minutes 09/23/16  Yes [provider]  carbidopa-levodopa (SINEMET IR) 25-100 MG tablet Take 1.5 tablets by mouth 3 (three) times daily. 11/01/19  Yes Kathrynn Ducking, MD  FLUoxetine (PROZAC) 20 MG capsule Take 80 mg by mouth daily.  01/12/16  Yes [provider]  pramipexole (MIRAPEX) 0.5 MG tablet TAKE 1 TABLET(0.5 MG) BY MOUTH THREE TIMES DAILY Patient taking differently: Take 0.5 mg by mouth 3 (three) times daily. TAKE 1 TABLET(0.5 MG) BY MOUTH THREE TIMES DAILY 08/05/20  Yes Kathrynn Ducking, MD  QUEtiapine (SEROQUEL) 25 MG tablet TAKE 1 TABLET(25 MG) BY MOUTH AT BEDTIME 08/05/20  Yes Kathrynn Ducking, MD  vitamin C (ASCORBIC ACID) 500 MG tablet Take 500 mg by mouth daily.   Yes [provider]    Allergies    Patient has no known allergies.  Review of Systems   Review of Systems  Constitutional:  Negative for fever.  Respiratory:  Negative for shortness of breath.   Cardiovascular:  Negative for chest pain.  Musculoskeletal:  Positive for arthralgias and gait problem. Negative for back pain and neck pain.  Skin:  Negative for wound.  Neurological:  Negative for syncope and headaches.   Physical Exam Updated Vital Signs BP 138/76 (BP Location: Right Arm)   Pulse 71   Temp 98.4 F (36.9 C) (Oral)   Resp 15   Ht 4\' 11"  (1.499 m)   Wt 50.9 kg   SpO2 94%   BMI 22.66 kg/m   Physical Exam Vitals and nursing note reviewed.  Constitutional:      General: She is not in acute distress.    Appearance: Normal appearance. She is not ill-appearing, toxic-appearing or diaphoretic.  HENT:     Head: Normocephalic.  Eyes:     Conjunctiva/sclera: Conjunctivae normal.  Cardiovascular:     Rate and Rhythm: Normal rate and regular rhythm.     Pulses: Normal pulses.  Pulmonary:     Effort: Pulmonary effort is normal.  No respiratory distress.  Musculoskeletal:        General: Tenderness and signs of injury present. No deformity.     Cervical back: No rigidity.     Comments: LLE shortened, externally rotated No C/T/L spine tenderness  Skin:    General: Skin is warm and dry.     Coloration: Skin is not jaundiced or pale.  Neurological:     General: No focal deficit present.     Mental Status: She is alert and oriented to person, place, and time.    ED Results / Procedures / Treatments   Labs (all labs ordered are listed, but only abnormal results are displayed) Labs Reviewed  CBC WITH DIFFERENTIAL/PLATELET - Abnormal; Notable for the following components:      Result Value   WBC 12.1 (*)    Neutro Abs 10.8 (*)    Lymphs Abs 0.6 (*)    Abs Immature Granulocytes 0.08 (*)    All other components within normal limits  RESP PANEL BY RT-PCR (FLU A&B, COVID) [id number]  PROTIME-INR  COMPREHENSIVE METABOLIC PANEL  BASIC METABOLIC PANEL  CBC WITH DIFFERENTIAL/PLATELET  MAGNESIUM  TYPE AND SCREEN  ABO/RH    EKG None  Radiology CT Head Wo Contrast  Result Date: 08/22/2020 CLINICAL DATA:  Head injury after fall. EXAM: CT HEAD WITHOUT CONTRAST TECHNIQUE: Contiguous axial images were obtained from the base of the skull through the vertex without intravenous contrast. COMPARISON:  July 18, 2020. FINDINGS: Brain: No evidence of acute infarction, hemorrhage, hydrocephalus, extra-axial collection or mass lesion/mass effect. Vascular: No hyperdense vessel or unexpected calcification. Skull: Normal. Negative for fracture or focal lesion. Sinuses/Orbits: No acute finding. Other: None. IMPRESSION: No acute intracranial abnormality seen. Electronically Signed   By: Marijo Conception M.D.   On: 08/22/2020 13:52   DG Hip Unilat With Pelvis 2-3 Views Left  Result Date: 08/22/2020 CLINICAL DATA:  Status post fall. EXAM: DG HIP (WITH OR WITHOUT PELVIS) 2-3V LEFT COMPARISON:  08/26/2018 FINDINGS: Acute subcapital left  femoral neck fracture is identified. Mild impaction. Mild internal rotation of the distal fracture fragments. Healed fracture deformity involving the right pubic bone at the symphysis. Right hip appears intact. IMPRESSION: Acute subcapital left femoral neck fracture with mild impaction and internal rotation of the distal fracture fragments. Electronically Signed   By: Kerby Moors M.D.   On: 08/22/2020 13:36    Procedures Procedures   Medications Ordered in ED Medications  sodium chloride flush (NS) 0.9 % injection 3 mL (3  mLs Intravenous Not Given 08/22/20 2000)  0.9 %  sodium chloride infusion ( Intravenous New Bag/Given 08/22/20 1716)  morphine 2 MG/ML injection 2 mg (2 mg Intravenous Given 08/22/20 2000)  acetaminophen (TYLENOL) tablet 650 mg (has no administration in time range)  senna-docusate (Senokot-S) tablet 1 tablet (has no administration in time range)  fentaNYL (SUBLIMAZE) injection 50 mcg (50 mcg Intramuscular Given 08/22/20 1253)  fentaNYL (SUBLIMAZE) injection 50 mcg (50 mcg Intravenous Given 08/22/20 1633)    ED Course  I have reviewed the triage vital signs and the nursing notes.  Pertinent labs & imaging results that were available during my care of the patient were reviewed by me and considered in my medical decision making (see chart for details).    MDM Rules/Calculators/A&P                           75yo female with history of Parkinsonism, depression, htn, hlpd presents with concern for fall with left hip pain.  Mechanical fall, denies other medical concerns. CT head without acute abnormalities. XR shows subcapital left femoral neck fracture with mild impaction and internal rotation of distal fracture fragments.   Consulted Orthopedics, Dr. Griffin Basil. Admission to [facility] planned, Orthopedics is discussing possible surgery today vs tomorrow, NPO for now. Admitted to hospitalist service for further care.   Final Clinical Impression(s) / ED Diagnoses Final diagnoses:  Fall,  initial encounter  Closed fracture of neck of left femur, initial encounter [facility])    Rx / DC Orders ED Discharge Orders     None        Gareth Morgan, MD 08/22/20 2308

## 2020-08-22 NOTE — H&P (View-Only) (Signed)
ORTHOPAEDIC CONSULTATION  REQUESTING PHYSICIAN: Dwyane Dee, MD  Chief Complaint: left hip pain  HPI: Patricia Fitzpatrick is a 75 y.o. female with PMH Parkinson's disease, osteoporosis, depression/anxiety, hypertension, hyperlipidemia who presented to the hospital after a mechanical accidental fall at home. Patient lost her footing and fell landing directly onto her left side. She was taken to [facility] Emergency Department. Work-up was significant for left femoral neck fracture. Orthopedics was consulted.   Patient seen in 1336. Stepdaughter at bedside. Pain has improved with IV pain medications. She complains only of left hip pain when she tries to move it. She denies any prior orthopedic history. She lives at home with her brother and her 73. She ambulates with a cane, but does not use it all the time. She is independent and can drive a car, but does not usually do so. She is very apprehensive because she has had more falls recently. Her tremors in her lower extremities have worsened over the past few months.   Past Medical History:  Diagnosis Date   Anxiety    Cataracts, bilateral    Depression    High cholesterol    Hypertension    OCD (obsessive compulsive disorder)    Parkinsonism ([place]) 10/14/2015   Tremor    Past Surgical History:  Procedure Laterality Date   BREAST BIOPSY  1981   CATARACT EXTRACTION Right 2017   EYE SURGERY  1952   Social History   Socioeconomic History   Marital status: Divorced    Spouse name: Not on file   Number of children: 0   Years of education: Bachelors   Highest education level: Not on file  Occupational History   Occupation: Retired  Tobacco Use   Smoking status: Never   Smokeless tobacco: Never  Substance and Sexual Activity   Alcohol use: Yes    Comment: One glass per year.   Drug use: No   Sexual activity: Not on file  Other Topics Concern   Not on file  Social History Narrative   Lives at home, with adult  stepdaughter, and brother.   Right-handed.   No caffeine use.   Social Determinants of Health   Financial Resource Strain: Not on file  Food Insecurity: Not on file  Transportation Needs: Not on file  Physical Activity: Not on file  Stress: Not on file  Social Connections: Not on file   Family History  Problem Relation Age of Onset   Pulmonary disease Mother    Alzheimer's disease Mother    Heart disease Father    Alzheimer's disease Father    No Known Allergies Prior to Admission medications   Medication Sig Start Date End Date Taking? Authorizing Provider  acetaminophen (TYLENOL) 500 MG tablet Take 500 mg by mouth every 6 (six) hours as needed for moderate pain.   Yes [provider]  alendronate (FOSAMAX) 70 MG tablet Take 70 mg by mouth once a week. With 8 ounces of water needs to remain upright for 30 minutes and no food or drink for 30 minutes 09/23/16  Yes [provider]  carbidopa-levodopa (SINEMET IR) 25-100 MG tablet Take 1.5 tablets by mouth 3 (three) times daily. 11/01/19  Yes Kathrynn Ducking, MD  FLUoxetine (PROZAC) 20 MG capsule Take 80 mg by mouth daily.  01/12/16  Yes [provider]  pramipexole (MIRAPEX) 0.5 MG tablet TAKE 1 TABLET(0.5 MG) BY MOUTH THREE TIMES DAILY Patient taking differently: Take 0.5 mg by mouth 3 (three)  times daily. TAKE 1 TABLET(0.5 MG) BY MOUTH THREE TIMES DAILY 08/05/20  Yes Kathrynn Ducking, MD  QUEtiapine (SEROQUEL) 25 MG tablet TAKE 1 TABLET(25 MG) BY MOUTH AT BEDTIME 08/05/20  Yes Kathrynn Ducking, MD  vitamin C (ASCORBIC ACID) 500 MG tablet Take 500 mg by mouth daily.   Yes [provider]   CT Head Wo Contrast  Result Date: 08/22/2020 CLINICAL DATA:  Head injury after fall. EXAM: CT HEAD WITHOUT CONTRAST TECHNIQUE: Contiguous axial images were obtained from the base of the skull through the vertex without intravenous contrast. COMPARISON:  July 18, 2020. FINDINGS: Brain: No evidence of acute  infarction, hemorrhage, hydrocephalus, extra-axial collection or mass lesion/mass effect. Vascular: No hyperdense vessel or unexpected calcification. Skull: Normal. Negative for fracture or focal lesion. Sinuses/Orbits: No acute finding. Other: None. IMPRESSION: No acute intracranial abnormality seen. Electronically Signed   By: Marijo Conception M.D.   On: 08/22/2020 13:52   DG Hip Unilat With Pelvis 2-3 Views Left  Result Date: 08/22/2020 CLINICAL DATA:  Status post fall. EXAM: DG HIP (WITH OR WITHOUT PELVIS) 2-3V LEFT COMPARISON:  08/26/2018 FINDINGS: Acute subcapital left femoral neck fracture is identified. Mild impaction. Mild internal rotation of the distal fracture fragments. Healed fracture deformity involving the right pubic bone at the symphysis. Right hip appears intact. IMPRESSION: Acute subcapital left femoral neck fracture with mild impaction and internal rotation of the distal fracture fragments. Electronically Signed   By: Kerby Moors M.D.   On: 08/22/2020 13:36   Family History Reviewed and non-contributory, no pertinent history of problems with bleeding or anesthesia      Review of Systems 14 system ROS conducted and negative except for that noted in HPI   OBJECTIVE  Vitals:Patient Vitals for the past 8 hrs:  BP Temp Temp src Pulse Resp SpO2 Height Weight  08/22/20 2158 138/76 98.4 F (36.9 C) Oral 71 15 94 % -- --  08/22/20 1706 (!) 128/109 98.6 F (37 C) Oral 70 16 95 % _0  (1.499 m) 50.9 kg  08/22/20 1500 123/81 -- -- 76 18 97 % -- --   General: Alert, no acute distress Cardiovascular: Warm extremities noted Respiratory: No cyanosis, no use of accessory musculature GI: No organomegaly, abdomen is soft and non-tender Skin: No lesions in the area of chief complaint other than those listed below in MSK exam.  Neurologic: Sensation intact distally save for the below mentioned MSK exam Psychiatric: Patient is competent for consent with normal mood and  affect Lymphatic: No swelling obvious and reported other than the area involved in the exam below  Extremities  Bilateral upper extremities: Actively moves bilateral upper extremities without pain. Non-tender to palpation shoulder, elbow, wrist, hand. NVI  RLE: non-tender to palpation hip, knee, ankle, foot.. Actively moves right lower extremity without pain. + GS/TA/EHL. Sensation intact in DP/SP/S/S/P distributions. 2+ DP pulse with warm and well perfused digits. Compartments soft and compressible, with no pain on passive stretch.  LLE: Shortened and externally rotated.  ROM deferred. + GS/TA/EHL. Sensation intact in DP/SP/S/S/P distributions. 2+ DP pulse with warm and well perfused digits. Compartments soft and compressible, with no pain on passive stretch.     Test Results Imaging Xray of left hip demonstrates displaced femoral neck fracture  Labs cbc Recent Labs    08/22/20 1329  WBC 12.1*  HGB 14.2  HCT 43.4  PLT 293    Labs inflam No results for input(s): CRP in the last 72 hours.  Invalid  input(s): ESR  Labs coag Recent Labs    08/22/20 1329  INR 0.9    Recent Labs    08/22/20 1329  NA 138  K 4.5  CL 107  CO2 24  GLUCOSE 89  BUN 22  CREATININE 0.80  CALCIUM 9.2     ASSESSMENT AND PLAN: 75 y.o. female with the following: left femoral neck fracture   Orthopedics recommends admission to a medical service and we will provide consultation and follow along  Discussed the nature of the injury as well as the care with the patient as well as the family.  Discussed options and non-operative versus operative measures. Nonoperative measures are not well tolerated as patient's on bedrest for extended periods of time tend to develop secondary issues such as pneumonia, urinary tract infections, bedsores and delirium.  Based on this our recommendation is for operative measures.  Understanding this the patient/family elected to proceed with operative measures.  The  risks and benefits of  surgical intervention including infection, bleeding, nerve injury, periprosthetic fracture, the need for revision surgery, leg length discrepancy, gait change, blood clots, cardiopulmonary complications, morbidity, mortality, among others, and they were willing to proceed.    - Plan: Operative fixation tomorrow am. - NPO at midnight  -Medicine team to admit and perform pre-op clearance - Weight Bearing Status/Activity: will ammend WB status postop, bedrest for now - PT/OT post op - VTE Prophylaxis: SCDs for now - Pain control: PRN pain medications - Dispo: Likely to require Rehab or SNF placement upon discharge.  - Contact information: Dr. Ophelia Charter, Noemi Chapel, PA-C  [place], [place] 08/22/2020 10:02 PM

## 2020-08-22 NOTE — Assessment & Plan Note (Signed)
-   Previously on Fosamax.  She states that she is about to start Prolia outpatient instead

## 2020-08-22 NOTE — Consult Note (Signed)
ORTHOPAEDIC CONSULTATION  REQUESTING PHYSICIAN: Dwyane Dee, MD  Chief Complaint: left hip pain  HPI: Patricia Fitzpatrick is a 75 y.o. female with PMH Parkinson's disease, osteoporosis, depression/anxiety, hypertension, hyperlipidemia who presented to the hospital after a mechanical accidental fall at home. Patient lost her footing and fell landing directly onto her left side. She was taken to [facility] Emergency Department. Work-up was significant for left femoral neck fracture. Orthopedics was consulted.   Patient seen in 1336. Stepdaughter at bedside. Pain has improved with IV pain medications. She complains only of left hip pain when she tries to move it. She denies any prior orthopedic history. She lives at home with her brother and her 73. She ambulates with a cane, but does not use it all the time. She is independent and can drive a car, but does not usually do so. She is very apprehensive because she has had more falls recently. Her tremors in her lower extremities have worsened over the past few months.   Past Medical History:  Diagnosis Date   Anxiety    Cataracts, bilateral    Depression    High cholesterol    Hypertension    OCD (obsessive compulsive disorder)    Parkinsonism ([place]) 10/14/2015   Tremor    Past Surgical History:  Procedure Laterality Date   BREAST BIOPSY  1981   CATARACT EXTRACTION Right 2017   EYE SURGERY  1952   Social History   Socioeconomic History   Marital status: Divorced    Spouse name: Not on file   Number of children: 0   Years of education: Bachelors   Highest education level: Not on file  Occupational History   Occupation: Retired  Tobacco Use   Smoking status: Never   Smokeless tobacco: Never  Substance and Sexual Activity   Alcohol use: Yes    Comment: One glass per year.   Drug use: No   Sexual activity: Not on file  Other Topics Concern   Not on file  Social History Narrative   Lives at home, with adult  stepdaughter, and brother.   Right-handed.   No caffeine use.   Social Determinants of Health   Financial Resource Strain: Not on file  Food Insecurity: Not on file  Transportation Needs: Not on file  Physical Activity: Not on file  Stress: Not on file  Social Connections: Not on file   Family History  Problem Relation Age of Onset   Pulmonary disease Mother    Alzheimer's disease Mother    Heart disease Father    Alzheimer's disease Father    No Known Allergies Prior to Admission medications   Medication Sig Start Date End Date Taking? Authorizing Provider  acetaminophen (TYLENOL) 500 MG tablet Take 500 mg by mouth every 6 (six) hours as needed for moderate pain.   Yes [provider]  alendronate (FOSAMAX) 70 MG tablet Take 70 mg by mouth once a week. With 8 ounces of water needs to remain upright for 30 minutes and no food or drink for 30 minutes 09/23/16  Yes [provider]  carbidopa-levodopa (SINEMET IR) 25-100 MG tablet Take 1.5 tablets by mouth 3 (three) times daily. 11/01/19  Yes Kathrynn Ducking, MD  FLUoxetine (PROZAC) 20 MG capsule Take 80 mg by mouth daily.  01/12/16  Yes [provider]  pramipexole (MIRAPEX) 0.5 MG tablet TAKE 1 TABLET(0.5 MG) BY MOUTH THREE TIMES DAILY Patient taking differently: Take 0.5 mg by mouth 3 (three)  times daily. TAKE 1 TABLET(0.5 MG) BY MOUTH THREE TIMES DAILY 08/05/20  Yes Kathrynn Ducking, MD  QUEtiapine (SEROQUEL) 25 MG tablet TAKE 1 TABLET(25 MG) BY MOUTH AT BEDTIME 08/05/20  Yes Kathrynn Ducking, MD  vitamin C (ASCORBIC ACID) 500 MG tablet Take 500 mg by mouth daily.   Yes [provider]   CT Head Wo Contrast  Result Date: 08/22/2020 CLINICAL DATA:  Head injury after fall. EXAM: CT HEAD WITHOUT CONTRAST TECHNIQUE: Contiguous axial images were obtained from the base of the skull through the vertex without intravenous contrast. COMPARISON:  July 18, 2020. FINDINGS: Brain: No evidence of acute  infarction, hemorrhage, hydrocephalus, extra-axial collection or mass lesion/mass effect. Vascular: No hyperdense vessel or unexpected calcification. Skull: Normal. Negative for fracture or focal lesion. Sinuses/Orbits: No acute finding. Other: None. IMPRESSION: No acute intracranial abnormality seen. Electronically Signed   By: Marijo Conception M.D.   On: 08/22/2020 13:52   DG Hip Unilat With Pelvis 2-3 Views Left  Result Date: 08/22/2020 CLINICAL DATA:  Status post fall. EXAM: DG HIP (WITH OR WITHOUT PELVIS) 2-3V LEFT COMPARISON:  08/26/2018 FINDINGS: Acute subcapital left femoral neck fracture is identified. Mild impaction. Mild internal rotation of the distal fracture fragments. Healed fracture deformity involving the right pubic bone at the symphysis. Right hip appears intact. IMPRESSION: Acute subcapital left femoral neck fracture with mild impaction and internal rotation of the distal fracture fragments. Electronically Signed   By: Kerby Moors M.D.   On: 08/22/2020 13:36   Family History Reviewed and non-contributory, no pertinent history of problems with bleeding or anesthesia      Review of Systems 14 system ROS conducted and negative except for that noted in HPI   OBJECTIVE  Vitals:Patient Vitals for the past 8 hrs:  BP Temp Temp src Pulse Resp SpO2 Height Weight  08/22/20 2158 138/76 98.4 F (36.9 C) Oral 71 15 94 % -- --  08/22/20 1706 (!) 128/109 98.6 F (37 C) Oral 70 16 95 % _0  (1.499 m) 50.9 kg  08/22/20 1500 123/81 -- -- 76 18 97 % -- --   General: Alert, no acute distress Cardiovascular: Warm extremities noted Respiratory: No cyanosis, no use of accessory musculature GI: No organomegaly, abdomen is soft and non-tender Skin: No lesions in the area of chief complaint other than those listed below in MSK exam.  Neurologic: Sensation intact distally save for the below mentioned MSK exam Psychiatric: Patient is competent for consent with normal mood and  affect Lymphatic: No swelling obvious and reported other than the area involved in the exam below  Extremities  Bilateral upper extremities: Actively moves bilateral upper extremities without pain. Non-tender to palpation shoulder, elbow, wrist, hand. NVI  RLE: non-tender to palpation hip, knee, ankle, foot.. Actively moves right lower extremity without pain. + GS/TA/EHL. Sensation intact in DP/SP/S/S/P distributions. 2+ DP pulse with warm and well perfused digits. Compartments soft and compressible, with no pain on passive stretch.  LLE: Shortened and externally rotated.  ROM deferred. + GS/TA/EHL. Sensation intact in DP/SP/S/S/P distributions. 2+ DP pulse with warm and well perfused digits. Compartments soft and compressible, with no pain on passive stretch.     Test Results Imaging Xray of left hip demonstrates displaced femoral neck fracture  Labs cbc Recent Labs    08/22/20 1329  WBC 12.1*  HGB 14.2  HCT 43.4  PLT 293    Labs inflam No results for input(s): CRP in the last 72 hours.  Invalid  input(s): ESR  Labs coag Recent Labs    08/22/20 1329  INR 0.9    Recent Labs    08/22/20 1329  NA 138  K 4.5  CL 107  CO2 24  GLUCOSE 89  BUN 22  CREATININE 0.80  CALCIUM 9.2     ASSESSMENT AND PLAN: 75 y.o. female with the following: left femoral neck fracture   Orthopedics recommends admission to a medical service and we will provide consultation and follow along  Discussed the nature of the injury as well as the care with the patient as well as the family.  Discussed options and non-operative versus operative measures. Nonoperative measures are not well tolerated as patient's on bedrest for extended periods of time tend to develop secondary issues such as pneumonia, urinary tract infections, bedsores and delirium.  Based on this our recommendation is for operative measures.  Understanding this the patient/family elected to proceed with operative measures.  The  risks and benefits of  surgical intervention including infection, bleeding, nerve injury, periprosthetic fracture, the need for revision surgery, leg length discrepancy, gait change, blood clots, cardiopulmonary complications, morbidity, mortality, among others, and they were willing to proceed.    - Plan: Operative fixation tomorrow am. - NPO at midnight  -Medicine team to admit and perform pre-op clearance - Weight Bearing Status/Activity: will ammend WB status postop, bedrest for now - PT/OT post op - VTE Prophylaxis: SCDs for now - Pain control: PRN pain medications - Dispo: Likely to require Rehab or SNF placement upon discharge.  - Contact information: Dr. Ophelia Charter, Noemi Chapel, PA-C  [place], [place] 08/22/2020 10:02 PM

## 2020-08-22 NOTE — Assessment & Plan Note (Signed)
-   Not on home medications.  Blood pressure controlled.  Treat pain first for any BP elevations

## 2020-08-23 ENCOUNTER — Inpatient Hospital Stay (HOSPITAL_COMMUNITY): Payer: PPO | Admitting: Certified Registered Nurse Anesthetist

## 2020-08-23 ENCOUNTER — Encounter (HOSPITAL_COMMUNITY)
Admission: EM | Disposition: A | Discharge status: Skilled Nursing Facility | Payer: Self-pay | Source: Home / Self Care | Attending: Internal Medicine

## 2020-08-23 ENCOUNTER — Inpatient Hospital Stay (HOSPITAL_COMMUNITY): Payer: PPO

## 2020-08-23 ENCOUNTER — Inpatient Hospital Stay (HOSPITAL_COMMUNITY): Admission: RE | Admit: 2020-08-23 | Payer: PPO | Source: Home / Self Care | Admitting: Orthopaedic Surgery

## 2020-08-23 DIAGNOSIS — G20 Parkinson's disease: Secondary | ICD-10-CM

## 2020-08-23 DIAGNOSIS — S72002A Fracture of unspecified part of neck of left femur, initial encounter for closed fracture: Secondary | ICD-10-CM | POA: Diagnosis not present

## 2020-08-23 HISTORY — PX: HIP ARTHROPLASTY: SHX981

## 2020-08-23 LAB — BASIC METABOLIC PANEL
Anion gap: 9 (ref 5–15)
BUN: 24 mg/dL — ABNORMAL HIGH (ref 8–23)
CO2: 25 mmol/L (ref 22–32)
Calcium: 8.8 mg/dL — ABNORMAL LOW (ref 8.9–10.3)
Chloride: 103 mmol/L (ref 98–111)
Creatinine, Ser: 0.9 mg/dL (ref 0.44–1.00)
GFR, Estimated: >60 mL/min (ref >60)
Glucose, Bld: 114 mg/dL — ABNORMAL HIGH (ref 70–99)
Potassium: 4.4 mmol/L (ref 3.5–5.1)
Sodium: 137 mmol/L (ref 135–145)

## 2020-08-23 LAB — CBC WITH DIFFERENTIAL/PLATELET
Abs Immature Granulocytes: 0.04 10*3/uL (ref 0.00–0.07)
Basophils Absolute: 0 10*3/uL (ref 0.0–0.1)
Basophils Relative: 0 %
Eosinophils Absolute: 0.1 10*3/uL (ref 0.0–0.5)
Eosinophils Relative: 1 %
HCT: 39 % (ref 36.0–46.0)
Hemoglobin: 12.5 g/dL (ref 12.0–15.0)
Immature Granulocytes: 0 %
Lymphocytes Relative: 6 %
Lymphs Abs: 0.5 10*3/uL — ABNORMAL LOW (ref 0.7–4.0)
MCH: 30.6 pg (ref 26.0–34.0)
MCHC: 32.1 g/dL (ref 30.0–36.0)
MCV: 95.6 fL (ref 80.0–100.0)
Monocytes Absolute: 0.8 10*3/uL (ref 0.1–1.0)
Monocytes Relative: 8 %
Neutro Abs: 8.1 10*3/uL — ABNORMAL HIGH (ref 1.7–7.7)
Neutrophils Relative %: 85 %
Platelets: 253 10*3/uL (ref 150–400)
RBC: 4.08 MIL/uL (ref 3.87–5.11)
RDW: 12.6 % (ref 11.5–15.5)
WBC: 9.6 10*3/uL (ref 4.0–10.5)
nRBC: 0 % (ref 0.0–0.2)

## 2020-08-23 LAB — MRSA NEXT GEN BY PCR, NASAL: MRSA by PCR Next Gen: NOT DETECTED

## 2020-08-23 LAB — MAGNESIUM: Magnesium: 2.1 mg/dL (ref 1.7–2.4)

## 2020-08-23 SURGERY — HEMIARTHROPLASTY, HIP, DIRECT ANTERIOR APPROACH, FOR FRACTURE
Anesthesia: Choice | Site: Hip | Laterality: Left

## 2020-08-23 SURGERY — HEMIARTHROPLASTY, HIP, DIRECT ANTERIOR APPROACH, FOR FRACTURE
Anesthesia: General | Site: Hip | Laterality: Left

## 2020-08-23 MED ORDER — PHENYLEPHRINE HCL-NACL 10-0.9 MG/250ML-% IV SOLN
INTRAVENOUS | Status: DC | PRN
  Administered 2020-08-23: 50 ug/min via INTRAVENOUS

## 2020-08-23 MED ORDER — TRANEXAMIC ACID-NACL 1000-0.7 MG/100ML-% IV SOLN
INTRAVENOUS | Status: AC
  Filled 2020-08-23: qty 100

## 2020-08-23 MED ORDER — TRANEXAMIC ACID-NACL 1000-0.7 MG/100ML-% IV SOLN
1000.0000 mg | INTRAVENOUS | Status: AC
  Administered 2020-08-23: 1000 mg via INTRAVENOUS

## 2020-08-23 MED ORDER — SUGAMMADEX SODIUM 500 MG/5ML IV SOLN
INTRAVENOUS | Status: AC
  Filled 2020-08-23: qty 5

## 2020-08-23 MED ORDER — ALBUMIN HUMAN 5 % IV SOLN
INTRAVENOUS | Status: AC
  Filled 2020-08-23: qty 250

## 2020-08-23 MED ORDER — FLUOXETINE HCL 20 MG PO CAPS
80.0000 mg | ORAL_CAPSULE | Freq: Every day | ORAL | Status: DC
  Administered 2020-08-24 – 2020-08-25 (×2): 80 mg via ORAL
  Filled 2020-08-23 (×2): qty 4

## 2020-08-23 MED ORDER — PROPOFOL 10 MG/ML IV BOLUS
INTRAVENOUS | Status: DC | PRN
  Administered 2020-08-23: 110 mg via INTRAVENOUS

## 2020-08-23 MED ORDER — ALBUMIN HUMAN 5 % IV SOLN: INTRAVENOUS | Status: DC | PRN

## 2020-08-23 MED ORDER — FENTANYL CITRATE (PF) 100 MCG/2ML IJ SOLN
INTRAMUSCULAR | Status: AC
  Filled 2020-08-23: qty 2

## 2020-08-23 MED ORDER — CEFAZOLIN SODIUM-DEXTROSE 2-4 GM/100ML-% IV SOLN
2.0000 g | Freq: Four times a day (QID) | INTRAVENOUS | Status: DC
  Administered 2020-08-23: 2 g via INTRAVENOUS
  Filled 2020-08-23 (×2): qty 100

## 2020-08-23 MED ORDER — SODIUM CHLORIDE 0.9 % IV SOLN
2.0000 g | Freq: Once | INTRAVENOUS | Status: AC
  Administered 2020-08-23: 2 g via INTRAVENOUS
  Filled 2020-08-23 (×2): qty 2

## 2020-08-23 MED ORDER — PHENOL 1.4 % MT LIQD: 1.0000 | OROMUCOSAL | Status: DC | PRN

## 2020-08-23 MED ORDER — VANCOMYCIN HCL 1000 MG IV SOLR
INTRAVENOUS | Status: DC | PRN
  Administered 2020-08-23: 1000 mg via TOPICAL

## 2020-08-23 MED ORDER — HYDROCODONE-ACETAMINOPHEN 7.5-325 MG PO TABS: 1.0000 | ORAL_TABLET | ORAL | Status: DC | PRN

## 2020-08-23 MED ORDER — CEFAZOLIN SODIUM-DEXTROSE 2-4 GM/100ML-% IV SOLN
2.0000 g | INTRAVENOUS | Status: AC
  Administered 2020-08-23: 2 g via INTRAVENOUS

## 2020-08-23 MED ORDER — PROPOFOL 500 MG/50ML IV EMUL
INTRAVENOUS | Status: DC | PRN
  Administered 2020-08-23: 15 ug/kg/min via INTRAVENOUS

## 2020-08-23 MED ORDER — ENOXAPARIN SODIUM 40 MG/0.4ML IJ SOSY
40.0000 mg | PREFILLED_SYRINGE | INTRAMUSCULAR | Status: DC
  Administered 2020-08-24 – 2020-08-25 (×2): 40 mg via SUBCUTANEOUS
  Filled 2020-08-23 (×2): qty 0.4

## 2020-08-23 MED ORDER — QUETIAPINE FUMARATE 25 MG PO TABS
25.0000 mg | ORAL_TABLET | Freq: Every day | ORAL | Status: DC
  Administered 2020-08-23 – 2020-08-24 (×2): 25 mg via ORAL
  Filled 2020-08-23 (×2): qty 1

## 2020-08-23 MED ORDER — DOCUSATE SODIUM 100 MG PO CAPS
100.0000 mg | ORAL_CAPSULE | Freq: Two times a day (BID) | ORAL | Status: DC
  Administered 2020-08-23 – 2020-08-25 (×4): 100 mg via ORAL
  Filled 2020-08-23 (×4): qty 1

## 2020-08-23 MED ORDER — CARBIDOPA-LEVODOPA 25-100 MG PO TABS
1.5000 | ORAL_TABLET | Freq: Three times a day (TID) | ORAL | Status: DC
  Administered 2020-08-23 – 2020-08-25 (×7): 1.5 via ORAL
  Filled 2020-08-23 (×7): qty 2

## 2020-08-23 MED ORDER — PHENYLEPHRINE HCL-NACL 10-0.9 MG/250ML-% IV SOLN
INTRAVENOUS | Status: AC
  Filled 2020-08-23: qty 250

## 2020-08-23 MED ORDER — EPHEDRINE SULFATE-NACL 50-0.9 MG/10ML-% IV SOSY
PREFILLED_SYRINGE | INTRAVENOUS | Status: DC | PRN
  Administered 2020-08-23: 15 mg via INTRAVENOUS
  Administered 2020-08-23 (×2): 10 mg via INTRAVENOUS

## 2020-08-23 MED ORDER — 0.9 % SODIUM CHLORIDE (POUR BTL) OPTIME
TOPICAL | Status: DC | PRN
  Administered 2020-08-23: 1000 mL

## 2020-08-23 MED ORDER — PRAMIPEXOLE DIHYDROCHLORIDE 0.25 MG PO TABS
0.5000 mg | ORAL_TABLET | Freq: Three times a day (TID) | ORAL | Status: DC
  Administered 2020-08-23 – 2020-08-25 (×7): 0.5 mg via ORAL
  Filled 2020-08-23 (×7): qty 2

## 2020-08-23 MED ORDER — ROCURONIUM BROMIDE 10 MG/ML (PF) SYRINGE
PREFILLED_SYRINGE | INTRAVENOUS | Status: AC
  Filled 2020-08-23: qty 10

## 2020-08-23 MED ORDER — OXYCODONE HCL 5 MG/5ML PO SOLN: 5.0000 mg | Freq: Once | ORAL | Status: AC | PRN

## 2020-08-23 MED ORDER — VANCOMYCIN HCL 1000 MG IV SOLR
INTRAVENOUS | Status: AC
  Filled 2020-08-23: qty 1000

## 2020-08-23 MED ORDER — MENTHOL 3 MG MT LOZG: 1.0000 | LOZENGE | OROMUCOSAL | Status: DC | PRN

## 2020-08-23 MED ORDER — MORPHINE SULFATE (PF) 2 MG/ML IV SOLN: 0.5000 mg | INTRAVENOUS | Status: DC | PRN

## 2020-08-23 MED ORDER — ONDANSETRON HCL 4 MG PO TABS: 4.0000 mg | ORAL_TABLET | Freq: Four times a day (QID) | ORAL | Status: DC | PRN

## 2020-08-23 MED ORDER — FENTANYL CITRATE (PF) 100 MCG/2ML IJ SOLN: 25.0000 ug | INTRAMUSCULAR | Status: DC | PRN

## 2020-08-23 MED ORDER — SUGAMMADEX SODIUM 200 MG/2ML IV SOLN
INTRAVENOUS | Status: DC | PRN
  Administered 2020-08-23: 100 mg via INTRAVENOUS

## 2020-08-23 MED ORDER — ONDANSETRON HCL 4 MG/2ML IJ SOLN: 4.0000 mg | Freq: Once | INTRAMUSCULAR | Status: DC | PRN

## 2020-08-23 MED ORDER — CEFAZOLIN SODIUM-DEXTROSE 2-4 GM/100ML-% IV SOLN
INTRAVENOUS | Status: AC
  Filled 2020-08-23: qty 100

## 2020-08-23 MED ORDER — PHENYLEPHRINE 40 MCG/ML (10ML) SYRINGE FOR IV PUSH (FOR BLOOD PRESSURE SUPPORT)
PREFILLED_SYRINGE | INTRAVENOUS | Status: DC | PRN
  Administered 2020-08-23 (×3): 80 ug via INTRAVENOUS

## 2020-08-23 MED ORDER — PROPOFOL 10 MG/ML IV BOLUS
INTRAVENOUS | Status: AC
  Filled 2020-08-23: qty 20

## 2020-08-23 MED ORDER — ONDANSETRON HCL 4 MG/2ML IJ SOLN: 4.0000 mg | Freq: Four times a day (QID) | INTRAMUSCULAR | Status: DC | PRN

## 2020-08-23 MED ORDER — OXYCODONE HCL 5 MG PO TABS
ORAL_TABLET | ORAL | Status: AC
  Filled 2020-08-23: qty 1

## 2020-08-23 MED ORDER — ACETAMINOPHEN 500 MG PO TABS
500.0000 mg | ORAL_TABLET | Freq: Four times a day (QID) | ORAL | Status: AC
  Administered 2020-08-23 – 2020-08-24 (×2): 500 mg via ORAL
  Filled 2020-08-23 (×2): qty 1

## 2020-08-23 MED ORDER — DEXAMETHASONE SODIUM PHOSPHATE 10 MG/ML IJ SOLN
INTRAMUSCULAR | Status: DC | PRN
  Administered 2020-08-23: 4 mg via INTRAVENOUS

## 2020-08-23 MED ORDER — HYDROCODONE-ACETAMINOPHEN 5-325 MG PO TABS
1.0000 | ORAL_TABLET | ORAL | Status: DC | PRN
  Administered 2020-08-23 – 2020-08-24 (×4): 1 via ORAL
  Filled 2020-08-23 (×4): qty 1

## 2020-08-23 MED ORDER — TRANEXAMIC ACID-NACL 1000-0.7 MG/100ML-% IV SOLN
1000.0000 mg | Freq: Once | INTRAVENOUS | Status: AC
  Administered 2020-08-23: 1000 mg via INTRAVENOUS
  Filled 2020-08-23: qty 100

## 2020-08-23 MED ORDER — LIDOCAINE 2% (20 MG/ML) 5 ML SYRINGE
INTRAMUSCULAR | Status: DC | PRN
  Administered 2020-08-23: 50 mg via INTRAVENOUS

## 2020-08-23 MED ORDER — LACTATED RINGERS IV SOLN: INTRAVENOUS | Status: DC | PRN

## 2020-08-23 MED ORDER — LIDOCAINE 2% (20 MG/ML) 5 ML SYRINGE
INTRAMUSCULAR | Status: AC
  Filled 2020-08-23: qty 5

## 2020-08-23 MED ORDER — ONDANSETRON HCL 4 MG/2ML IJ SOLN
INTRAMUSCULAR | Status: DC | PRN
  Administered 2020-08-23: 4 mg via INTRAVENOUS

## 2020-08-23 MED ORDER — FENTANYL CITRATE (PF) 100 MCG/2ML IJ SOLN
INTRAMUSCULAR | Status: DC | PRN
  Administered 2020-08-23 (×2): 50 ug via INTRAVENOUS

## 2020-08-23 MED ORDER — ASCORBIC ACID 500 MG PO TABS
500.0000 mg | ORAL_TABLET | Freq: Every day | ORAL | Status: DC
  Administered 2020-08-23 – 2020-08-25 (×3): 500 mg via ORAL
  Filled 2020-08-23 (×3): qty 1

## 2020-08-23 MED ORDER — OXYCODONE HCL 5 MG PO TABS
5.0000 mg | ORAL_TABLET | Freq: Once | ORAL | Status: AC | PRN
  Administered 2020-08-23: 5 mg via ORAL

## 2020-08-23 MED ORDER — STERILE WATER FOR IRRIGATION IR SOLN
Status: DC | PRN
  Administered 2020-08-23: 2000 mL

## 2020-08-23 MED ORDER — ROCURONIUM BROMIDE 10 MG/ML (PF) SYRINGE
PREFILLED_SYRINGE | INTRAVENOUS | Status: DC | PRN
  Administered 2020-08-23: 10 mg via INTRAVENOUS
  Administered 2020-08-23: 50 mg via INTRAVENOUS

## 2020-08-23 MED ORDER — ONDANSETRON HCL 4 MG/2ML IJ SOLN
INTRAMUSCULAR | Status: AC
  Filled 2020-08-23: qty 2

## 2020-08-23 MED ORDER — DEXAMETHASONE SODIUM PHOSPHATE 10 MG/ML IJ SOLN
INTRAMUSCULAR | Status: AC
  Filled 2020-08-23: qty 1

## 2020-08-23 SURGICAL SUPPLY — 50 items
BAG COUNTER SPONGE SURGICOUNT (BAG) IMPLANT
BAG SPNG CNTER NS LX DISP (BAG)
BIT DRILL 2.0X128 (BIT) ×2 IMPLANT
BLADE SAW SAG 73X25 THK (BLADE) ×1
BLADE SAW SGTL 73X25 THK (BLADE) ×1 IMPLANT
CLSR STERI-STRIP ANTIMIC 1/2X4 (GAUZE/BANDAGES/DRESSINGS) ×2 IMPLANT
COVER SURGICAL LIGHT HANDLE (MISCELLANEOUS) ×2 IMPLANT
DRAPE INCISE IOBAN 66X45 STRL (DRAPES) ×4 IMPLANT
DRAPE ORTHO SPLIT 77X108 STRL (DRAPES) ×4
DRAPE SURG ORHT 6 SPLT 77X108 (DRAPES) ×2 IMPLANT
DRAPE U-SHAPE 47X51 STRL (DRAPES) ×2 IMPLANT
DRESSING AQUACEL AG SP 3.5X6 (GAUZE/BANDAGES/DRESSINGS) ×1 IMPLANT
DRSG AQUACEL AG ADV 3.5X10 (GAUZE/BANDAGES/DRESSINGS) ×1 IMPLANT
DRSG AQUACEL AG SP 3.5X6 (GAUZE/BANDAGES/DRESSINGS) ×2
DURAPREP 26ML APPLICATOR (WOUND CARE) ×2 IMPLANT
ELECT REM PT RETURN 15FT ADLT (MISCELLANEOUS) ×2 IMPLANT
GLOVE SRG 8 PF TXTR STRL LF DI (GLOVE) ×1 IMPLANT
GLOVE SURG ENC MOIS LTX SZ6.5 (GLOVE) ×4 IMPLANT
GLOVE SURG LTX SZ8 (GLOVE) ×8 IMPLANT
GLOVE SURG UNDER POLY LF SZ6.5 (GLOVE) ×2 IMPLANT
GLOVE SURG UNDER POLY LF SZ8 (GLOVE) ×2
GOWN STRL REUS W/TWL LRG LVL3 (GOWN DISPOSABLE) ×4 IMPLANT
HANDPIECE INTERPULSE COAX TIP (DISPOSABLE) ×2
HEAD FEM UNIPOLAR 44 OD STRL (Hips) ×1 IMPLANT
KIT BASIN OR (CUSTOM PROCEDURE TRAY) ×2 IMPLANT
KIT TURNOVER KIT A (KITS) ×2 IMPLANT
NS IRRIG 1000ML POUR BTL (IV SOLUTION) ×2 IMPLANT
PACK TOTAL JOINT (CUSTOM PROCEDURE TRAY) ×2 IMPLANT
PENCIL SMOKE EVACUATOR (MISCELLANEOUS) IMPLANT
PILLOW ABDUCTION MEDIUM (MISCELLANEOUS) ×1 IMPLANT
PROTECTOR NERVE ULNAR (MISCELLANEOUS) ×2 IMPLANT
RETRIEVER SUT HEWSON (MISCELLANEOUS) ×2 IMPLANT
SET HNDPC FAN SPRY TIP SCT (DISPOSABLE) ×1 IMPLANT
SPACER FEM TAPERED +0 12/14 (Hips) ×1 IMPLANT
SPONGE T-LAP 4X18 ~~LOC~~+RFID (SPONGE) ×2 IMPLANT
STEM CORAIL KA13 (Stem) ×1 IMPLANT
SUT ETHIBOND NAB CT1 #1 30IN (SUTURE) ×2 IMPLANT
SUT FIBERWIRE #2 38 T-5 BLUE (SUTURE) ×6
SUT MNCRL AB 4-0 PS2 18 (SUTURE) ×3 IMPLANT
SUT VIC AB 0 CT1 36 (SUTURE) ×2 IMPLANT
SUT VIC AB 1 CT1 27 (SUTURE)
SUT VIC AB 1 CT1 27XBRD ANTBC (SUTURE) IMPLANT
SUT VIC AB 2-0 CT1 27 (SUTURE) ×4
SUT VIC AB 2-0 CT1 TAPERPNT 27 (SUTURE) ×2 IMPLANT
SUT VIC AB 3-0 SH 8-18 (SUTURE) ×2 IMPLANT
SUTURE FIBERWR #2 38 T-5 BLUE (SUTURE) IMPLANT
TOWEL OR 17X26 10 PK STRL BLUE (TOWEL DISPOSABLE) ×6 IMPLANT
TOWER CARTRIDGE SMART MIX (DISPOSABLE) ×2 IMPLANT
TRAY FOLEY MTR SLVR 16FR STAT (SET/KITS/TRAYS/PACK) IMPLANT
WATER STERILE IRR 1000ML POUR (IV SOLUTION) ×2 IMPLANT

## 2020-08-23 NOTE — Anesthesia Procedure Notes (Signed)
Procedure Name: Intubation Date/Time: 08/23/2020 7:44 AM Performed by: West Pugh, CRNA Pre-anesthesia Checklist: Patient identified, Emergency Drugs available, Suction available, Patient being monitored and Timeout performed Patient Re-evaluated:Patient Re-evaluated prior to induction Oxygen Delivery Method: Circle system utilized Preoxygenation: Pre-oxygenation with 100% oxygen Induction Type: IV induction Ventilation: Mask ventilation without difficulty Laryngoscope Size: Mac and 3 Grade View: Grade I Tube type: Oral Number of attempts: 1 Airway Equipment and Method: Stylet Placement Confirmation: ETT inserted through vocal cords under direct vision, positive ETCO2, CO2 detector and breath sounds checked- equal and bilateral Secured at: 21 cm Tube secured with: Tape Dental Injury: Teeth and Oropharynx as per pre-operative assessment

## 2020-08-23 NOTE — Op Note (Signed)
Orthopaedic Surgery Operative Note (CSN: [id number])  Patricia Fitzpatrick  05-28-1945 Date of Surgery: 08/22/2020 - 08/23/2020   Diagnoses:  Left displaced femoral neck fracture Procedure: Left hip hemiarthroplasty   Operative Finding Successful completion of the planned procedure.  Patient had a relatively high level of discrepancy in her proximal canal diameter in her head size.  We used a size 13 Corail stem and a 44 0 ball.  We had good fit and we actually bone grafted proximally to try and encourage ingrowth of the stem.  We took bone from the femoral head.  Post-operative plan: The patient will be weightbearing to tolerance with posterior hip precautions.  The patient will be readmitted to the floor.  DVT prophylaxis Lovenox 40 mg/day until mobilizing and then consider transition in clinic to alternative medicines including aspirin if mobilizing well.   Pain control with PRN pain medication preferring oral medicines.  Follow up plan will be scheduled in approximately 14 days for incision check and XR.  Post-Op Diagnosis: Same Surgeons:Primary: Hiram Gash, MD Assistants:Caroline McBane PA-C Location: [facility] ROOM 10 Anesthesia: General Antibiotics: Ancef 2 g with local vancomycin powder 1 g at the surgical site Tourniquet time: * Estimated Blood Loss: 268 Complications: None Specimens: None Implants: Implant Name Type Inv. Item Serial No. Manufacturer Lot No. LRB No. Used Action  STEM [place] - [id number] Stem STEM Ronalee Belts  [employer]PU[employer] ORTH[employer] [id number] Left 1 Implanted  HIP BALL DEPUY - [id number] Hips HIP BALL DEPUY  DEPUY ORTHOPA[employer] [id number] Left 1 Implanted  SPACER TAPER DEPUY - [id number] Hips SPACER TAPER DEPUY  DEPUY ORTHOPA[employer] [id number] Left 1 Implanted    Indications for Surgery:   Patricia Fitzpatrick is a 75 y.o. female with fall resulting in a displaced femoral neck fracture.  Benefits and risks of operative and nonoperative management were discussed prior to surgery with  patient/guardian(s) and informed consent form was completed.  Specific risks including infection, need for additional surgery, periprosthetic fracture, instability, infection and postoperative arthrosis amongst others   Procedure:   The patient was identified properly. Informed consent was obtained and the surgical site was marked. The patient was taken up to suite where general anesthesia was induced.  The patient was positioned lateral with a mark to positioner.  The left hip was prepped and draped in the usual sterile fashion.  Timeout was performed before the beginning of the case.  We made an incision centered over the greater trochanter with a scalpel. We used the scalpel to continue to dissect to the fascia. The fascia was pierced with Bovie electrocautery. Mayo scissors were used to cut the fascia in a longitudinal fashion. The gluteus maximus fibers were bluntly split in line with their fibers.   The femur was slowly internally rotated, putting tension on the posterior structures. Bovie electrocautery was used to dissect the short external rotators off of the insertion onto the femur. After the short external rotators were transected, we visualized the femoral neck fracture. We identified the sciatic nerve by palpation and verified that it was not in danger from dissection.   We made a T-shaped capsulotomy. We made a neck cut approximately 2 cm above the lesser trochanter with a a reciprocating saw. We removed the femoral head. We used the box cutter to cut away some of the greater trochanter for ease of insertion of the stem. We then used the canal finder to locate the femoral canal.   Using the angle of the femoral neck as our guide  for version, we broached sequentially. We trialed components and found appropriate fit and stability.  The patient would come to full extension of the hip. The hip was stable at 90 degrees of flexion and 70 degrees of internal rotation.  We then removed the trials  as well as the broach. We ensured there were no foreign bodies or bone within the acetabulum. We copiously irrigated the wound.  We then carefully placed our stem, size listed above before assembling the head and neck together onto the stem. We then reduced the hip. Again, that was stable in the previously mentioned manipulations.  We bone grafted from the femoral head placing it within the canal.  We repaired the posterior capsule taking care to both repair the T split individual #2 FiberWire sutures.  Short external rotators repaired to the greater trochanter to bone tunnels. We closed the fascia of the iliotibial band and gluteus maximus with running and intterupted Vicryl sutures. We then closed Scarpa's fascia with running Vicryl sutures. Skin was closed with 3-0 Vicryl and 4-0 Monocryl with Steri-Strips and Aquasol dressing was placed.   We irrigated the wound copiously before placing local antibiotic as listed above.  We closed the incision in a multilayer fashion with absorbable suture.  Sterile dressing was placed.  Abduction pillow was placed.  Patient was awoken taken to PACU in stable condition.  Noemi Chapel, PA-C, present and scrubbed throughout the case, critical for completion in a timely fashion, and for retraction, instrumentation, closure.

## 2020-08-23 NOTE — Anesthesia Postprocedure Evaluation (Signed)
Anesthesia Post Note  Patient: Patricia Fitzpatrick  Procedure(s) Performed: ARTHROPLASTY BIPOLAR HIP (HEMIARTHROPLASTY) (Left: Hip)     Patient location during evaluation: PACU Anesthesia Type: General Level of consciousness: awake and alert Pain management: pain level controlled Vital Signs Assessment: post-procedure vital signs reviewed and stable Respiratory status: spontaneous breathing, nonlabored ventilation, respiratory function stable and patient connected to nasal cannula oxygen Cardiovascular status: blood pressure returned to baseline and stable Postop Assessment: no apparent nausea or vomiting Anesthetic complications: no   No notable events documented.  Last Vitals:  Vitals:   08/23/20 1042 08/23/20 1303  BP: 125/68 121/64  Pulse: 79 81  Resp: 20 17  Temp:  36.9 C  SpO2: 98% 96%    Last Pain:  Vitals:   08/23/20 1303  TempSrc: Oral  PainSc:                  Audry Pili

## 2020-08-23 NOTE — Interval H&P Note (Signed)
We discussed dislocation, fracture, and infetion.  All questions answered.  History and Physical Interval Note:  08/23/2020 7:27 AM  Patricia Fitzpatrick  has presented today for surgery, with the diagnosis of left hip fracture.  The various methods of treatment have been discussed with the patient and family. After consideration of risks, benefits and other options for treatment, the patient has consented to  Procedure(s): ARTHROPLASTY BIPOLAR HIP (HEMIARTHROPLASTY) (Left) as a surgical intervention.  The patient's history has been reviewed, patient examined, no change in status, stable for surgery.  I have reviewed the patient's chart and labs.  Questions were answered to the patient's satisfaction.     Hiram Gash

## 2020-08-23 NOTE — Progress Notes (Signed)
Progress Note    Patricia Fitzpatrick   VVO:[id number]  DOB: 30-Sep-1945  DOA: 08/22/2020     1  PCP: Shon Baton, MD  CC: fall at home  Hospital Course: Patricia Fitzpatrick is a 75 yo female with PMH Parkinson's disease, osteoporosis, depression/anxiety, hypertension, hyperlipidemia who presented to the hospital after a mechanical accidental fall at home.  She states she was working on making breakfast this morning when she saw a bug on the floor and attempted to throw a piece of bread at it and somehow lost her footing and fell onto her left side on the floor.  X-ray obtained in the ER showed and acute subcapital left femoral neck fracture with mild impaction and internal rotation of the distal fracture fragments. CT head was also obtained which was unremarkable.  She is admitted for orthopedic surgery evaluation for tentative repair. She was evaluated and taken to the OR on 08/23/2020 and underwent left hip hemiarthroplasty.  She tolerated surgery well.  Interval History:  Seen in her room this morning after returning from surgery.  Tolerated well and was about to start eating lunch.  Family in room visiting as well.  ROS: Constitutional: negative for chills and fevers, Respiratory: negative for cough, Cardiovascular: negative for chest pain, and Gastrointestinal: negative for abdominal pain  Assessment & Plan: Fracture of femoral neck, left (HCC) - s/p mechanical fall at home; does not sound like any syncope or prodrome of such; med list reviewed and seems appropriate for her level of Parkinson's as well - s/p left hip hemiarthroplasty on 08/23/2020 -PT/OT eval's starting 08/24/2020 - Continue pain control - Resume DVT prophylaxis with Lovenox on 08/24/2020  Osteoporosis - Previously on Fosamax.  She states that she is about to start Prolia outpatient instead  Depression - Continue Seroquel and Prozac  Hypertension - Not on home medications.  Blood pressure controlled.  Treat pain first for any BP  elevations  Parkinsonism (HCC) - Continue Sinemet    Old records reviewed in assessment of this patient  Antimicrobials:   DVT prophylaxis: enoxaparin (LOVENOX) injection 40 mg Start: 08/24/20 0800 SCDs Start: 08/23/20 1042 SCDs Start: 08/22/20 1702   Code Status:   Code Status: Full Code Family Communication: Bedside  Disposition Plan: Status is: Inpatient  Remains inpatient appropriate because:Ongoing active pain requiring inpatient pain management, Unsafe d/c plan, and Inpatient level of care appropriate due to severity of illness  Dispo: The patient is from: Home              Anticipated d/c is to:  Pending PT eval's              Patient currently is not medically stable to d/c.   Difficult to place patient No      Risk of unplanned readmission score: Unplanned Admission- Pilot do not use: 15.49   Objective: Blood pressure 121/64, pulse 81, temperature 98.5 F (36.9 C), temperature source Oral, resp. rate 17, height 4\' 11"  (1.499 m), weight 50.9 kg, SpO2 96 %.  Examination: General appearance: alert, cooperative, and no distress Head: Normocephalic, without obvious abnormality, atraumatic Eyes:  EOMI Lungs: clear to auscultation bilaterally Heart: regular rate and rhythm and S1, S2 normal Abdomen: normal findings: bowel sounds normal and soft, non-tender Extremities: Surgical dressing in place with ice pack over.  Compartments soft Skin: mobility and turgor normal Neurologic: LLE limited by pain; no other focal deficits otherwise  Consultants:  Orthopedic surgery  Procedures:  Left hip hemiarthroplasty, 08/23/2020  Data Reviewed: I  have personally reviewed following labs and imaging studies Results for orders placed or performed during the hospital encounter of 08/22/20 (from the past 24 hour(s))  ABO/Rh     Status: None   Collection Time: 08/22/20  6:36 PM  Result Value Ref Range   ABO/RH(D)      A POS Performed at [facility], [place] [address] [place], [place] [address]   Basic metabolic panel     Status: Abnormal   Collection Time: 08/23/20  2:03 AM  Result Value Ref Range   Sodium 137 135 - 145 mmol/L   Potassium 4.4 3.5 - 5.1 mmol/L   Chloride 103 98 - 111 mmol/L   CO2 25 22 - 32 mmol/L   Glucose, Bld 114 (H) 70 - 99 mg/dL   BUN 24 (H) 8 - 23 mg/dL   Creatinine, Ser 0.90 0.44 - 1.00 mg/dL   Calcium 8.8 (L) 8.9 - 10.3 mg/dL   GFR, Estimated >60 >60 mL/min   Anion gap 9 5 - 15  CBC with Differential/Platelet     Status: Abnormal   Collection Time: 08/23/20  2:03 AM  Result Value Ref Range   WBC 9.6 4.0 - 10.5 K/uL   RBC 4.08 3.87 - 5.11 MIL/uL   Hemoglobin 12.5 12.0 - 15.0 g/dL   HCT 39.0 36.0 - 46.0 %   MCV 95.6 80.0 - 100.0 fL   MCH 30.6 26.0 - 34.0 pg   MCHC 32.1 30.0 - 36.0 g/dL   RDW 12.6 11.5 - 15.5 %   Platelets 253 150 - 400 K/uL   nRBC 0.0 0.0 - 0.2 %   Neutrophils Relative % 85 %   Neutro Abs 8.1 (H) 1.7 - 7.7 K/uL   Lymphocytes Relative 6 %   Lymphs Abs 0.5 (L) 0.7 - 4.0 K/uL   Monocytes Relative 8 %   Monocytes Absolute 0.8 0.1 - 1.0 K/uL   Eosinophils Relative 1 %   Eosinophils Absolute 0.1 0.0 - 0.5 K/uL   Basophils Relative 0 %   Basophils Absolute 0.0 0.0 - 0.1 K/uL   Immature Granulocytes 0 %   Abs Immature Granulocytes 0.04 0.00 - 0.07 K/uL  Magnesium     Status: None   Collection Time: 08/23/20  2:03 AM  Result Value Ref Range   Magnesium 2.1 1.7 - 2.4 mg/dL  MRSA Next Gen by PCR, Nasal     Status: None   Collection Time: 08/23/20  3:30 AM   Specimen: Nasal Mucosa; Nasal Swab  Result Value Ref Range   MRSA by PCR Next Gen NOT DETECTED NOT DETECTED    Recent Results (from the past 240 hour(s))  Resp Panel by RT-PCR (Flu A&B, Covid) Nasopharyngeal Swab     Status: None   Collection Time: 08/22/20  2:48 PM   Specimen: Nasopharyngeal Swab; Nasopharyngeal(NP) swabs in vial transport medium  Result Value Ref Range Status   SARS Coronavirus 2 by RT PCR NEGATIVE NEGATIVE Final     Comment: (NOTE) SARS-CoV-2 target nucleic acids are NOT DETECTED.  The SARS-CoV-2 RNA is generally detectable in upper respiratory specimens during the acute phase of infection. The lowest concentration of SARS-CoV-2 viral copies this assay can detect is 138 copies/mL. A negative result does not preclude SARS-Cov-2 infection and should not be used as the sole basis for treatment or other patient management decisions. A negative result may occur with  improper specimen collection/handling, submission of specimen other than nasopharyngeal swab, presence of viral mutation(s) within  the areas targeted by this assay, and inadequate number of viral copies(<138 copies/mL). A negative result must be combined with clinical observations, patient history, and epidemiological information. The expected result is Negative.  Fact Sheet for Patients:  EntrepreneurPulse.com.au  Fact Sheet for Healthcare Providers:  IncredibleEmployment.be  This test is no t yet approved or cleared by the [place] FDA and  has been authorized for detection and/or diagnosis of SARS-CoV-2 by FDA under an Emergency Use Authorization (EUA). This EUA will remain  in effect (meaning this test can be used) for the duration of the COVID-19 declaration under Section 564(b)(1) of the Act, 21 U.S.C.section 360bbb-3(b)(1), unless the authorization is terminated  or revoked sooner.       Influenza A by PCR NEGATIVE NEGATIVE Final   Influenza B by PCR NEGATIVE NEGATIVE Final    Comment: (NOTE) The Xpert Xpress SARS-CoV-2/FLU/RSV plus assay is intended as an aid in the diagnosis of influenza from Nasopharyngeal swab specimens and should not be used as a sole basis for treatment. Nasal washings and aspirates are unacceptable for Xpert Xpress SARS-CoV-2/FLU/RSV testing.  Fact Sheet for Patients: EntrepreneurPulse.com.au  Fact Sheet for Healthcare  Providers: IncredibleEmployment.be  This test is not yet approved or cleared by the [place] FDA and has been authorized for detection and/or diagnosis of SARS-CoV-2 by FDA under an Emergency Use Authorization (EUA). This EUA will remain in effect (meaning this test can be used) for the duration of the COVID-19 declaration under Section 564(b)(1) of the Act, 21 U.S.C. section 360bbb-3(b)(1), unless the authorization is terminated or revoked.  Performed at [facility], [place] [address], [place], [place] [address]   MRSA Next Gen by PCR, Nasal     Status: None   Collection Time: 08/23/20  3:30 AM   Specimen: Nasal Mucosa; Nasal Swab  Result Value Ref Range Status   MRSA by PCR Next Gen NOT DETECTED NOT DETECTED Final    Comment: (NOTE) The GeneXpert MRSA Assay (FDA approved for NASAL specimens only), is one component of a comprehensive MRSA colonization surveillance program. It is not intended to diagnose MRSA infection nor to guide or monitor treatment for MRSA infections. Test performance is not FDA approved in patients less than 59 years old. Performed at [facility], [place] [address], [place], [place] [address]      Radiology Studies: CT Head Wo Contrast  Result Date: 08/22/2020 CLINICAL DATA:  Head injury after fall. EXAM: CT HEAD WITHOUT CONTRAST TECHNIQUE: Contiguous axial images were obtained from the base of the skull through the vertex without intravenous contrast. COMPARISON:  July 18, 2020. FINDINGS: Brain: No evidence of acute infarction, hemorrhage, hydrocephalus, extra-axial collection or mass lesion/mass effect. Vascular: No hyperdense vessel or unexpected calcification. Skull: Normal. Negative for fracture or focal lesion. Sinuses/Orbits: No acute finding. Other: None. IMPRESSION: No acute intracranial abnormality seen. Electronically Signed   By: Marijo Conception M.D.   On: 08/22/2020 13:52   Pelvis  Portable  Result Date: 08/23/2020 CLINICAL DATA:  Status post arthroplasty. EXAM: PORTABLE PELVIS 1-2 VIEWS COMPARISON:  Plain film of the LEFT hip dated 08/22/2020. FINDINGS: Interval LEFT hip arthroplasty. Hardware appears intact and appropriately positioned. Expected postsurgical changes within the overlying soft tissues. IMPRESSION: Status post LEFT hip arthroplasty. Hardware appears intact and appropriately positioned. Electronically Signed   By: Franki Cabot M.D.   On: 08/23/2020 10:31   DG Hip Port Unilat With Pelvis 1V Left  Result Date: 08/23/2020 CLINICAL DATA:  Status post  LEFT hip arthroplasty. EXAM: DG HIP (WITH OR WITHOUT PELVIS) 1V PORT LEFT COMPARISON:  LEFT hip plain film dated 08/22/2020. FINDINGS: Interval LEFT hip arthroplasty. Hardware appears intact and appropriately positioned. Osseous alignment is anatomic. Expected postsurgical changes seen within the overlying soft tissues. IMPRESSION: Status post LEFT hip arthroplasty. No evidence of surgical complicating feature. Electronically Signed   By: Franki Cabot M.D.   On: 08/23/2020 10:30   DG Hip Unilat With Pelvis 2-3 Views Left  Result Date: 08/22/2020 CLINICAL DATA:  Status post fall. EXAM: DG HIP (WITH OR WITHOUT PELVIS) 2-3V LEFT COMPARISON:  08/26/2018 FINDINGS: Acute subcapital left femoral neck fracture is identified. Mild impaction. Mild internal rotation of the distal fracture fragments. Healed fracture deformity involving the right pubic bone at the symphysis. Right hip appears intact. IMPRESSION: Acute subcapital left femoral neck fracture with mild impaction and internal rotation of the distal fracture fragments. Electronically Signed   By: Kerby Moors M.D.   On: 08/22/2020 13:36   DG Hip Port Unilat With Pelvis 1V Left  Final Result    Pelvis Portable  Final Result    CT Head Wo Contrast  Final Result    DG Hip Unilat With Pelvis 2-3 Views Left  Final Result      Scheduled Meds:  acetaminophen  500 mg  Oral Q6H   vitamin C  500 mg Oral Daily   carbidopa-levodopa  1.5 tablet Oral TID    ceFAZolin (ANCEF) IV  2 g Intravenous Once   docusate sodium  100 mg Oral BID   [START ON 08/24/2020] enoxaparin (LOVENOX) injection  40 mg Subcutaneous Q24H   FLUoxetine  80 mg Oral Daily   oxyCODONE       pramipexole  0.5 mg Oral TID   QUEtiapine  25 mg Oral QHS   sodium chloride flush  3 mL Intravenous Q12H   PRN Meds: HYDROcodone-acetaminophen, HYDROcodone-acetaminophen, menthol-cetylpyridinium **OR** phenol, morphine injection, ondansetron **OR** ondansetron (ZOFRAN) IV Continuous Infusions:  sodium chloride 75 mL/hr at 08/22/20 1716     LOS: 1 day  Time spent: Greater than 50% of the 35 minute visit was spent in counseling/coordination of care for the patient as laid out in the A&P.   Dwyane Dee, MD [facility] 08/23/2020, 3:57 PM

## 2020-08-23 NOTE — Transfer of Care (Signed)
Immediate Anesthesia Transfer of Care Note  Patient: Patricia Fitzpatrick  Procedure(s) Performed: ARTHROPLASTY BIPOLAR HIP (HEMIARTHROPLASTY) (Left: Hip)  Patient Location: PACU  Anesthesia Type:General  Level of Consciousness: awake, drowsy and patient cooperative  Airway & Oxygen Therapy: Patient Spontanous Breathing and Patient connected to face mask oxygen  Post-op Assessment: Report given to RN and Post -op Vital signs reviewed and stable  Post vital signs: Reviewed and stable  Last Vitals:  Vitals Value Taken Time  BP 139/74 08/23/20 0937  Temp 36.9 C 08/23/20 0937  Pulse 86 08/23/20 0940  Resp 25 08/23/20 0940  SpO2 100 % 08/23/20 0940  Vitals shown include unvalidated device data.  Last Pain:  Vitals:   08/23/20 0937  TempSrc:   PainSc: Asleep      Patients Stated Pain Goal: 0 (36/06/77 0340)  Complications: No notable events documented.

## 2020-08-24 DIAGNOSIS — I159 Secondary hypertension, unspecified: Secondary | ICD-10-CM

## 2020-08-24 DIAGNOSIS — S72002A Fracture of unspecified part of neck of left femur, initial encounter for closed fracture: Secondary | ICD-10-CM | POA: Diagnosis not present

## 2020-08-24 LAB — BASIC METABOLIC PANEL
Anion gap: 5 (ref 5–15)
BUN: 21 mg/dL (ref 8–23)
CO2: 26 mmol/L (ref 22–32)
Calcium: 8 mg/dL — ABNORMAL LOW (ref 8.9–10.3)
Chloride: 107 mmol/L (ref 98–111)
Creatinine, Ser: 0.8 mg/dL (ref 0.44–1.00)
GFR, Estimated: >60 mL/min (ref >60)
Glucose, Bld: 120 mg/dL — ABNORMAL HIGH (ref 70–99)
Potassium: 3.8 mmol/L (ref 3.5–5.1)
Sodium: 138 mmol/L (ref 135–145)

## 2020-08-24 LAB — CBC WITH DIFFERENTIAL/PLATELET
Abs Immature Granulocytes: 0.04 10*3/uL (ref 0.00–0.07)
Basophils Absolute: 0 10*3/uL (ref 0.0–0.1)
Basophils Relative: 0 %
Eosinophils Absolute: 0.1 10*3/uL (ref 0.0–0.5)
Eosinophils Relative: 1 %
HCT: 28.3 % — ABNORMAL LOW (ref 36.0–46.0)
Hemoglobin: 9.1 g/dL — ABNORMAL LOW (ref 12.0–15.0)
Immature Granulocytes: 0 %
Lymphocytes Relative: 10 %
Lymphs Abs: 0.9 10*3/uL (ref 0.7–4.0)
MCH: 31.1 pg (ref 26.0–34.0)
MCHC: 32.2 g/dL (ref 30.0–36.0)
MCV: 96.6 fL (ref 80.0–100.0)
Monocytes Absolute: 0.8 10*3/uL (ref 0.1–1.0)
Monocytes Relative: 9 %
Neutro Abs: 7.2 10*3/uL (ref 1.7–7.7)
Neutrophils Relative %: 80 %
Platelets: 173 10*3/uL (ref 150–400)
RBC: 2.93 MIL/uL — ABNORMAL LOW (ref 3.87–5.11)
RDW: 12.6 % (ref 11.5–15.5)
WBC: 9 10*3/uL (ref 4.0–10.5)
nRBC: 0 % (ref 0.0–0.2)

## 2020-08-24 LAB — MAGNESIUM: Magnesium: 1.8 mg/dL (ref 1.7–2.4)

## 2020-08-24 MED ORDER — HYDROCODONE-ACETAMINOPHEN 5-325 MG PO TABS: 1.0000 | ORAL_TABLET | Freq: Four times a day (QID) | ORAL | 0 refills | Status: DC | PRN

## 2020-08-24 MED ORDER — ENOXAPARIN SODIUM 40 MG/0.4ML IJ SOSY: 40.0000 mg | PREFILLED_SYRINGE | INTRAMUSCULAR | 0 refills | Status: DC

## 2020-08-24 NOTE — Plan of Care (Signed)
Pain well managed with scheduled Tylenol and PRN Norco.  Ayesha Mohair BSN RN CMSRN   Problem: Pain Managment: Goal: General experience of comfort will improve Outcome: Progressing

## 2020-08-24 NOTE — NC FL2 (Signed)
[place] LEVEL OF CARE SCREENING TOOL     IDENTIFICATION  Patient Name: Patricia Fitzpatrick Birthdate: Jun 30, 1945 Sex: female Admission Date (Current Location): 08/22/2020  [facility] and [place] Number:  Herbalist and Address:  [facility],  [place], [place]      Provider Number: [id number]  Attending Physician Name and Address:  Dwyane Dee, MD  Relative Name and Phone Number:  step-daughter, Elsi Stelzer #[phone]    Current Level of Care: Hospital Recommended Level of Care: [place] Prior Approval Number:    Date Approved/Denied:   PASRR Number: [id number]  Discharge Plan: SNF    Current Diagnoses: Patient Active Problem List   Diagnosis Date Noted   Fracture of femoral neck, left ([place]) 08/22/2020   Hypertension    Depression    Osteoporosis    Parkinsonism ([place]) 10/14/2015   Tremor 10/14/2015   Memory change 10/14/2015    Orientation RESPIRATION BLADDER Height & Weight     Self, Time, Situation, Place  Normal Continent Weight: 112 lb 3.4 oz (50.9 kg) Height:  4\' 11"  (149.9 cm)  BEHAVIORAL SYMPTOMS/MOOD NEUROLOGICAL BOWEL NUTRITION STATUS      Continent    AMBULATORY STATUS COMMUNICATION OF NEEDS Skin   Limited Assist Verbally Surgical wounds                       Personal Care Assistance Level of Assistance  Bathing, Dressing Bathing Assistance: Limited assistance   Dressing Assistance: Limited assistance     Functional Limitations Info             SPECIAL CARE FACTORS FREQUENCY  PT (By licensed PT), OT (By licensed OT)     PT Frequency: 5x/wk OT Frequency: 5x/wk            Contractures Contractures Info: Not present    Additional Factors Info  Code Status, Psychotropic Code Status Info: Full   Psychotropic Info: see MAR         Current Medications (08/24/2020):  This is the current hospital active medication list Current Facility-Administered Medications   Medication Dose Route Frequency Provider Last Rate Last Admin   ascorbic acid (VITAMIN C) tablet 500 mg  500 mg Oral Daily Dwyane Dee, MD   500 mg at 08/24/20 0851   carbidopa-levodopa (SINEMET IR) 25-100 MG per tablet immediate release 1.5 tablet  1.5 tablet Oral TID Dwyane Dee, MD   1.5 tablet at 08/24/20 2633   docusate sodium (COLACE) capsule 100 mg  100 mg Oral BID Ethelda Chick, PA-C   100 mg at 08/24/20 0852   enoxaparin (LOVENOX) injection 40 mg  40 mg Subcutaneous Q24H Ethelda Chick, PA-C   40 mg at 08/24/20 0856   FLUoxetine (PROZAC) capsule 80 mg  80 mg Oral Daily Dwyane Dee, MD   80 mg at 08/24/20 0851   HYDROcodone-acetaminophen (NORCO) 7.5-325 MG per tablet 1 tablet  1 tablet Oral Q4H PRN McBane, Maylene Roes, PA-C       HYDROcodone-acetaminophen (NORCO/VICODIN) 5-325 MG per tablet 1 tablet  1 tablet Oral Q4H PRN Ethelda Chick, PA-C   1 tablet at 08/24/20 1353   menthol-cetylpyridinium (CEPACOL) lozenge 3 mg  1 lozenge Oral PRN McBane, Maylene Roes, PA-C       Or   phenol (CHLORASEPTIC) mouth spray 1 spray  1 spray Mouth/Throat PRN McBane, Maylene Roes, PA-C       morphine 2 MG/ML injection 0.5-1  mg  0.5-1 mg Intravenous Q2H PRN McBane, Maylene Roes, PA-C       ondansetron (ZOFRAN) tablet 4 mg  4 mg Oral Q6H PRN McBane, Maylene Roes, PA-C       Or   ondansetron (ZOFRAN) injection 4 mg  4 mg Intravenous Q6H PRN McBane, Maylene Roes, PA-C       pramipexole (MIRAPEX) tablet 0.5 mg  0.5 mg Oral TID Dwyane Dee, MD   0.5 mg at 08/24/20 0850   QUEtiapine (SEROQUEL) tablet 25 mg  25 mg Oral Standley Brooking, MD   25 mg at 08/23/20 2129   sodium chloride flush (NS) 0.9 % injection 3 mL  3 mL Intravenous Q12H Ethelda Chick, PA-C   3 mL at 08/24/20 1201     Discharge Medications: Please see discharge summary for a list of discharge medications.  Relevant Imaging Results:  Relevant Lab Results:   Additional Information SS#:  [id number];  Has had COVID  vaccine x 2 and booster x 1  Sheranda Seabrooks, LCSW

## 2020-08-24 NOTE — Evaluation (Signed)
Physical Therapy Evaluation Patient Details Name: Patricia Fitzpatrick MRN: [id number] DOB: Jan 05, 1946 Today's Date: 08/24/2020   History of Present Illness  Pt admitted from home s/p fall with L hip fx and now s/p hemi-arthroplasty by posterior approach.  Pt with hx of OCD and Parkinsonism  Clinical Impression  Pt admitted as above and presenting with functional mobility limitations 2* decreased L LE strength/ROM, post op pain, and posterior THP.  Pt would benefit from follow up rehab at SNF level to maximize IND and safety prior to return home with limited assist.    Follow Up Recommendations SNF    Equipment Recommendations  None recommended by PT    Recommendations for Other Services OT consult     Precautions / Restrictions Precautions Precautions: Posterior Hip;Fall Precaution Booklet Issued: Yes (comment) Precaution Comments: THP reviewed x 3 with limited carry over Restrictions Weight Bearing Restrictions: No LLE Weight Bearing: Weight bearing as tolerated      Mobility  Bed Mobility Overal bed mobility: Needs Assistance Bed Mobility: Supine to Sit     Supine to sit: Mod assist     General bed mobility comments: increased time with cues for sequence, use of R LE to self assist and adherence to THP.  Physical assist to manage L LE, to bring trunk to upright and to complete rotation to EOB sitting using bed pad    Transfers Overall transfer level: Needs assistance Equipment used: Rolling walker (2 wheeled) Transfers: Sit to/from Stand Sit to Stand: Min assist;Mod assist;+2 physical assistance;+2 safety/equipment;From elevated surface         General transfer comment: cues for LE management, adherence to THP and use of UEs to self assist  Ambulation/Gait Ambulation/Gait assistance: Min assist;+2 safety/equipment;Mod assist Gait Distance (Feet): 24 Feet Assistive device: Rolling walker (2 wheeled) Gait Pattern/deviations: Step-to pattern;Decreased step length -  right;Decreased step length - left;Step-through pattern;Shuffle;Trunk flexed;Narrow base of support Gait velocity: decr   General Gait Details: cues for sequence, posture, position from RW, and to increase BOS.  Physical assist for balance/support and to manage RW  Stairs            Wheelchair Mobility    Modified Rankin (Stroke Patients Only)       Balance Overall balance assessment: Needs assistance Sitting-balance support: No upper extremity supported;Feet supported Sitting balance-Leahy Scale: Fair     Standing balance support: Bilateral upper extremity supported Standing balance-Leahy Scale: Poor                               Pertinent Vitals/Pain Pain Assessment: 0-10 Pain Score: 4  Pain Location: L hip Pain Descriptors / Indicators: Aching;Sore Pain Intervention(s): Limited activity within patient's tolerance;Monitored during session;Premedicated before session;Ice applied    Home Living Family/patient expects to be discharged to:: Skilled nursing facility Living Arrangements: Other relatives;Alone               Additional Comments: Pt lives with older step-dtr and brother but indicates neither is very helpful    Prior Function                 Hand Dominance        Extremity/Trunk Assessment   Upper Extremity Assessment Upper Extremity Assessment: Defer to OT evaluation    Lower Extremity Assessment Lower Extremity Assessment: LLE deficits/detail LLE Deficits / Details: AAROM to 75 flex and 15 abd       Communication   Communication: No  difficulties  Cognition Arousal/Alertness: Awake/alert Behavior During Therapy: WFL for tasks assessed/performed Overall Cognitive Status: No family/caregiver present to determine baseline cognitive functioning                                 General Comments: Pt overly talkative, repeats information and with little carry over of information provided.      General  Comments      Exercises Total Joint Exercises Ankle Circles/Pumps: AAROM;Both;15 reps;Supine Quad Sets: AROM;Both;10 reps;Supine Heel Slides: AAROM;Left;15 reps;Supine Hip ABduction/ADduction: AAROM;Left;15 reps;Supine   Assessment/Plan    PT Assessment Patient needs continued PT services  PT Problem List Decreased strength;Decreased range of motion;Decreased activity tolerance;Decreased balance;Decreased mobility;Decreased cognition;Decreased knowledge of use of DME;Pain;Decreased knowledge of precautions       PT Treatment Interventions DME instruction;Gait training;Functional mobility training;Therapeutic activities;Therapeutic exercise;Balance training;Cognitive remediation;Patient/family education    PT Goals (Current goals can be found in the Care Plan section)  Acute Rehab PT Goals Patient Stated Goal: Rehab to regain IND and then home PT Goal Formulation: With patient Time For Goal Achievement: 09/07/20 Potential to Achieve Goals: Fair    Frequency Min 3X/week   Barriers to discharge        Co-evaluation               AM-PAC PT "6 Clicks" Mobility  Outcome Measure Help needed turning from your back to your side while in a flat bed without using bedrails?: A Lot Help needed moving from lying on your back to sitting on the side of a flat bed without using bedrails?: A Lot Help needed moving to and from a bed to a chair (including a wheelchair)?: A Lot Help needed standing up from a chair using your arms (e.g., wheelchair or bedside chair)?: A Lot Help needed to walk in hospital room?: A Lot   6 Click Score: 10    End of Session Equipment Utilized During Treatment: Gait belt;Other (comment) (foam wedge) Activity Tolerance: Patient tolerated treatment well Patient left: in chair;with call bell/phone within reach;with chair alarm set Nurse Communication: Mobility status PT Visit Diagnosis: History of falling (Z91.81);Difficulty in walking, not elsewhere  classified (R26.2)    Time: 1040-1130 PT Time Calculation (min) (ACUTE ONLY): 50 min   Charges:   PT Evaluation $PT Eval Low Complexity: 1 Low PT Treatments $Gait Training: 8-22 mins $Therapeutic Exercise: 8-22 mins        Debe Coder PT Acute Rehabilitation Services Pager [phone] Office [phone]   Royden Bulman 08/24/2020, 11:50 AM

## 2020-08-24 NOTE — Evaluation (Signed)
Occupational Therapy Evaluation Patient Details Name: Patricia Fitzpatrick MRN: [id number] DOB: [birth year] Today's Date: 08/24/2020    History of Present Illness Pt admitted from home s/p fall with L hip fx and now s/p hemi-arthroplasty by posterior approach.  Pt with hx of OCD and Parkinsonism   Clinical Impression   PTA, pt was living at home with her older brother and step-daughter, pt reports she was independent with ADL/IADL and functional mobility. Pt currently requires minA to powerup from chair and elevated surface. She required minA for LB dressing, toileting transfer and hygiene. Pt is limited by hip precautions, decreased activity tolerance, pain in left hip, and instability. Due to decline in current level of function, pt would benefit from acute OT to address established goals to facilitate safe D/C to venue listed below. At this time, recommend SNF follow-up. Will continue to follow acutely.     Follow Up Recommendations  SNF;Supervision/Assistance - 24 hour    Equipment Recommendations  3 in 1 bedside commode    Recommendations for Other Services       Precautions / Restrictions Precautions Precautions: Posterior Hip;Fall Precaution Booklet Issued: Yes (comment) Precaution Comments: THP reviewed x 3 with limited carry over Restrictions Weight Bearing Restrictions: No LLE Weight Bearing: Weight bearing as tolerated      Mobility Bed Mobility Overal bed mobility: Needs Assistance Bed Mobility: Supine to Sit     Supine to sit: Mod assist     General bed mobility comments: received pt in recliner, returned pt to recliner    Transfers Overall transfer level: Needs assistance Equipment used: Rolling walker (2 wheeled) Transfers: Sit to/from Stand Sit to Stand: Min assist         General transfer comment: minA to powerup from elevated surface, cues for safe hand placement    Balance Overall balance assessment: Needs assistance Sitting-balance support: No upper  extremity supported;Feet supported Sitting balance-Leahy Scale: Fair     Standing balance support: Bilateral upper extremity supported Standing balance-Leahy Scale: Poor                             ADL either performed or assessed with clinical judgement   ADL Overall ADL's : Needs assistance/impaired Eating/Feeding: Set up;Sitting   Grooming: Minimal assistance;Standing   Upper Body Bathing: Set up;Sitting   Lower Body Bathing: Minimal assistance;Sit to/from stand   Upper Body Dressing : Set up;Sitting   Lower Body Dressing: Minimal assistance;Sit to/from stand Lower Body Dressing Details (indicate cue type and reason): began education regarding AE to use to complete LB dressing Toilet Transfer: Minimal assistance;RW;Ambulation Toilet Transfer Details (indicate cue type and reason): ambulated from recliner to commode in bathroom ~42feet pt requires BSC over toilet Toileting- Clothing Manipulation and Hygiene: Minimal assistance;Sit to/from stand Toileting - Clothing Manipulation Details (indicate cue type and reason): cues for safe hand placement     Functional mobility during ADLs: Minimal assistance;Rolling walker General ADL Comments: cues or safety adn to redirect as pt is easily distracted and enjoys talking.     Vision Baseline Vision/History: Wears glasses Wears Glasses: At all times Patient Visual Report: No change from baseline       Perception     Praxis      Pertinent Vitals/Pain Pain Assessment: 0-10 Pain Score: 4  Pain Location: L hip Pain Descriptors / Indicators: Aching;Sore Pain Intervention(s): Limited activity within patient's tolerance;Monitored during session     Hand Dominance Right   Extremity/Trunk  Assessment Upper Extremity Assessment Upper Extremity Assessment: Overall WFL for tasks assessed   Lower Extremity Assessment Lower Extremity Assessment: Defer to PT evaluation LLE Deficits / Details: AAROM to 75 flex and 15  abd       Communication Communication Communication: No difficulties   Cognition Arousal/Alertness: Awake/alert Behavior During Therapy: WFL for tasks assessed/performed Overall Cognitive Status: No family/caregiver present to determine baseline cognitive functioning                                 General Comments: Pt appears to have short term memory limitations, talks a lot and is easily distrated but easily redirects   General Comments       Exercises Exercises: Total Joint Total Joint Exercises Ankle Circles/Pumps: AAROM;Both;15 reps;Supine Quad Sets: AROM;Both;10 reps;Supine Heel Slides: AAROM;Left;15 reps;Supine Hip ABduction/ADduction: AAROM;Left;15 reps;Supine   Shoulder Instructions      Home Living Family/patient expects to be discharged to:: Skilled nursing facility Living Arrangements: Other relatives;Alone                               Additional Comments: Pt lives with older step-dtr and brother but indicates neither is very helpful      Prior Functioning/Environment Level of Independence: Independent        Comments: pt reports she was independent with ADL        OT Problem List: Decreased strength;Impaired balance (sitting and/or standing);Decreased activity tolerance;Decreased knowledge of use of DME or AE;Decreased knowledge of precautions;Decreased safety awareness;Pain      OT Treatment/Interventions: Self-care/ADL training;Therapeutic exercise;DME and/or AE instruction;Therapeutic activities;Patient/family education;Balance training    OT Goals(Current goals can be found in the care plan section) Acute Rehab OT Goals Patient Stated Goal: to go to rehab to get stronger then return home OT Goal Formulation: With patient Time For Goal Achievement: 09/07/20 Potential to Achieve Goals: Good ADL Goals Pt Will Perform Lower Body Dressing: with adaptive equipment;with set-up Pt Will Transfer to Toilet: with  supervision;ambulating Additional ADL Goal #1: Pt will demonstrate independence with hip precuationsduring functional mobility and ADL completion.  OT Frequency: Min 2X/week   Barriers to D/C:            Co-evaluation              AM-PAC OT "6 Clicks" Daily Activity     Outcome Measure Help from another person eating meals?: None Help from another person taking care of personal grooming?: A Little Help from another person toileting, which includes using toliet, bedpan, or urinal?: A Little Help from another person bathing (including washing, rinsing, drying)?: A Little Help from another person to put on and taking off regular upper body clothing?: A Little Help from another person to put on and taking off regular lower body clothing?: A Little 6 Click Score: 19   End of Session Equipment Utilized During Treatment: Gait belt;Rolling walker Nurse Communication: Mobility status  Activity Tolerance: Patient tolerated treatment well Patient left: in chair;with call bell/phone within reach;with chair alarm set  OT Visit Diagnosis: Other abnormalities of gait and mobility (R26.89);Muscle weakness (generalized) (M62.81);Pain;Other symptoms and signs involving cognitive function Pain - Right/Left: Left Pain - part of body: Hip                Time: 0160-1093 OT Time Calculation (min): 28 min Charges:  OT General Charges $OT Visit: 1 Visit  OT Evaluation $OT Eval Moderate Complexity: 1 Mod OT Treatments $Self Care/Home Management : 8-22 mins  Helene Kelp OTR/L Acute Rehabilitation Services Office: [place] 08/24/2020, 1:42 PM

## 2020-08-24 NOTE — Progress Notes (Signed)
   ORTHOPAEDIC PROGRESS NOTE  s/p Procedure(s): ARTHROPLASTY BIPOLAR HIP (HEMIARTHROPLASTY)  SUBJECTIVE: Reports mild pain about operative site. No chest pain. No SOB. No nausea/vomiting. No other complaints.  OBJECTIVE: PE: General: pleasant, sitting up in hospital bed Left lower extremity: dressing CDI, abduction pillow in place, leg lengths equal, intact EHL/TA/GSC, endorses distal sensation, warm well perfused foot   Vitals:   08/24/20 0133 08/24/20 0530  BP: (!) 107/55 138/67  Pulse: 76 83  Resp: 16 16  Temp: 97.8 F (36.6 C) 98.8 F (37.1 C)  SpO2: 93% 99%     ASSESSMENT: Patricia Fitzpatrick is a 75 y.o. female doing well postoperatively. POD#1  PLAN: Weightbearing: WBAT LLE Insicional and dressing care: Reinforce dressings as needed Orthopedic device(s):  Abduction pillow Showering: Post-op day #2 with assistance VTE prophylaxis: Lovenox 40mg  qd  Pain control: PRN pain medications, minimize narcotics as able Follow - up plan: 2 weeks in office Dispo: TBD. PT/OT evaluations pending. Anticipate SNF Contact information:  Dr. Ophelia Charter, Noemi Chapel PA-C, After hours and holidays please check [email or web address] for group call information for [facility]   Noemi Chapel, PA-C 08/24/2020

## 2020-08-24 NOTE — Progress Notes (Signed)
Progress Note    Patricia Fitzpatrick   ZPH:[id number]  DOB: [birth year]  DOA: 08/22/2020     2  PCP: Shon Baton, MD  CC: fall at home  Hospital Course: Patricia Fitzpatrick is a 75 yo female with PMH Parkinson's disease, osteoporosis, depression/anxiety, hypertension, hyperlipidemia who presented to the hospital after a mechanical accidental fall at home.  She states she was working on making breakfast this morning when she saw a bug on the floor and attempted to throw a piece of bread at it and somehow lost her footing and fell onto her left side on the floor.  X-ray obtained in the ER showed and acute subcapital left femoral neck fracture with mild impaction and internal rotation of the distal fracture fragments. CT head was also obtained which was unremarkable.  She is admitted for orthopedic surgery evaluation for tentative repair. She was evaluated and taken to the OR on 08/23/2020 and underwent left hip hemiarthroplasty.  She tolerated surgery well.  Interval History:  No events overnight.  Some pain in hip as expected however well controlled.  Planning to work with PT/OT today.  She is amenable for rehab if needed.  ROS: Constitutional: negative for chills and fevers, Respiratory: negative for cough, Cardiovascular: negative for chest pain, and Gastrointestinal: negative for abdominal pain  Assessment & Plan: Fracture of femoral neck, left (HCC) - s/p mechanical fall at home; does not sound like any syncope or prodrome of such; med list reviewed and seems appropriate for her level of Parkinson's as well - s/p left hip hemiarthroplasty on 08/23/2020 - appreciate PT/OT evals. Rec is SNF - Continue pain control - continue lovenox for DVT ppx  Osteoporosis - Previously on Fosamax.  She states that she is about to start Prolia outpatient instead  Depression - Continue Seroquel and Prozac  Hypertension - Not on home medications.  Blood pressure controlled.  Treat pain first for any BP  elevations  Parkinsonism (HCC) - Continue Sinemet   Old records reviewed in assessment of this patient  Antimicrobials:   DVT prophylaxis: enoxaparin (LOVENOX) injection 40 mg Start: 08/24/20 0800 SCDs Start: 08/23/20 1042 SCDs Start: 08/22/20 1702   Code Status:   Code Status: Full Code Family Communication:   Disposition Plan: Status is: Inpatient  Remains inpatient appropriate because:Ongoing active pain requiring inpatient pain management, Unsafe d/c plan, and Inpatient level of care appropriate due to severity of illness  Dispo: The patient is from: Home              Anticipated d/c is to: SNF              Patient currently is not medically stable to d/c.   Difficult to place patient No  Risk of unplanned readmission score: Unplanned Admission- Pilot do not use: 15.76   Objective: Blood pressure 130/75, pulse 75, temperature 98.2 F (36.8 C), resp. rate 16, height 4\' 11"  (1.499 m), weight 50.9 kg, SpO2 100 %.  Examination: General appearance: alert, cooperative, and no distress Head: Normocephalic, without obvious abnormality, atraumatic Eyes:  EOMI Lungs: clear to auscultation bilaterally Heart: regular rate and rhythm and S1, S2 normal Abdomen: normal findings: bowel sounds normal and soft, non-tender Extremities: Surgical dressing in place with ice pack over.  Compartments soft Skin: mobility and turgor normal Neurologic: LLE limited by pain; no other focal deficits otherwise  Consultants:  Orthopedic surgery  Procedures:  Left hip hemiarthroplasty, 08/23/2020  Data Reviewed: I have personally reviewed following labs and imaging studies Results  for orders placed or performed during the hospital encounter of 08/22/20 (from the past 24 hour(s))  Basic metabolic panel     Status: Abnormal   Collection Time: 08/24/20  2:46 AM  Result Value Ref Range   Sodium 138 135 - 145 mmol/L   Potassium 3.8 3.5 - 5.1 mmol/L   Chloride 107 98 - 111 mmol/L   CO2 26 22 -  32 mmol/L   Glucose, Bld 120 (H) 70 - 99 mg/dL   BUN 21 8 - 23 mg/dL   Creatinine, Ser 0.80 0.44 - 1.00 mg/dL   Calcium 8.0 (L) 8.9 - 10.3 mg/dL   GFR, Estimated >60 >60 mL/min   Anion gap 5 5 - 15  Magnesium     Status: None   Collection Time: 08/24/20  2:46 AM  Result Value Ref Range   Magnesium 1.8 1.7 - 2.4 mg/dL  CBC with Differential/Platelet     Status: Abnormal   Collection Time: 08/24/20  6:26 AM  Result Value Ref Range   WBC 9.0 4.0 - 10.5 K/uL   RBC 2.93 (L) 3.87 - 5.11 MIL/uL   Hemoglobin 9.1 (L) 12.0 - 15.0 g/dL   HCT 28.3 (L) 36.0 - 46.0 %   MCV 96.6 80.0 - 100.0 fL   MCH 31.1 26.0 - 34.0 pg   MCHC 32.2 30.0 - 36.0 g/dL   RDW 12.6 11.5 - 15.5 %   Platelets 173 150 - 400 K/uL   nRBC 0.0 0.0 - 0.2 %   Neutrophils Relative % 80 %   Neutro Abs 7.2 1.7 - 7.7 K/uL   Lymphocytes Relative 10 %   Lymphs Abs 0.9 0.7 - 4.0 K/uL   Monocytes Relative 9 %   Monocytes Absolute 0.8 0.1 - 1.0 K/uL   Eosinophils Relative 1 %   Eosinophils Absolute 0.1 0.0 - 0.5 K/uL   Basophils Relative 0 %   Basophils Absolute 0.0 0.0 - 0.1 K/uL   Immature Granulocytes 0 %   Abs Immature Granulocytes 0.04 0.00 - 0.07 K/uL    Recent Results (from the past 240 hour(s))  Resp Panel by RT-PCR (Flu A&B, Covid) Nasopharyngeal Swab     Status: None   Collection Time: 08/22/20  2:48 PM   Specimen: Nasopharyngeal Swab; Nasopharyngeal(NP) swabs in vial transport medium  Result Value Ref Range Status   SARS Coronavirus 2 by RT PCR NEGATIVE NEGATIVE Final    Comment: (NOTE) SARS-CoV-2 target nucleic acids are NOT DETECTED.  The SARS-CoV-2 RNA is generally detectable in upper respiratory specimens during the acute phase of infection. The lowest concentration of SARS-CoV-2 viral copies this assay can detect is 138 copies/mL. A negative result does not preclude SARS-Cov-2 infection and should not be used as the sole basis for treatment or other patient management decisions. A negative result may  occur with  improper specimen collection/handling, submission of specimen other than nasopharyngeal swab, presence of viral mutation(s) within the areas targeted by this assay, and inadequate number of viral copies(<138 copies/mL). A negative result must be combined with clinical observations, patient history, and epidemiological information. The expected result is Negative.  Fact Sheet for Patients:  EntrepreneurPulse.com.au  Fact Sheet for Healthcare Providers:  IncredibleEmployment.be  This test is no t yet approved or cleared by the [place] FDA and  has been authorized for detection and/or diagnosis of SARS-CoV-2 by FDA under an Emergency Use Authorization (EUA). This EUA will remain  in effect (meaning this test can be used) for the duration of  the COVID-19 declaration under Section 564(b)(1) of the Act, 21 U.S.C.section 360bbb-3(b)(1), unless the authorization is terminated  or revoked sooner.       Influenza A by PCR NEGATIVE NEGATIVE Final   Influenza B by PCR NEGATIVE NEGATIVE Final    Comment: (NOTE) The Xpert Xpress SARS-CoV-2/FLU/RSV plus assay is intended as an aid in the diagnosis of influenza from Nasopharyngeal swab specimens and should not be used as a sole basis for treatment. Nasal washings and aspirates are unacceptable for Xpert Xpress SARS-CoV-2/FLU/RSV testing.  Fact Sheet for Patients: EntrepreneurPulse.com.au  Fact Sheet for Healthcare Providers: IncredibleEmployment.be  This test is not yet approved or cleared by the [place] FDA and has been authorized for detection and/or diagnosis of SARS-CoV-2 by FDA under an Emergency Use Authorization (EUA). This EUA will remain in effect (meaning this test can be used) for the duration of the COVID-19 declaration under Section 564(b)(1) of the Act, 21 U.S.C. section 360bbb-3(b)(1), unless the authorization is terminated  or revoked.  Performed at [facility], [place] [address] [place], [place] [address]   MRSA Next Gen by PCR, Nasal     Status: None   Collection Time: 08/23/20  3:30 AM   Specimen: Nasal Mucosa; Nasal Swab  Result Value Ref Range Status   MRSA by PCR Next Gen NOT DETECTED NOT DETECTED Final    Comment: (NOTE) The GeneXpert MRSA Assay (FDA approved for NASAL specimens only), is one component of a comprehensive MRSA colonization surveillance program. It is not intended to diagnose MRSA infection nor to guide or monitor treatment for MRSA infections. Test performance is not FDA approved in patients less than 80 years old. Performed at [facility], [place] [address], [place], [place] [address]      Radiology Studies: Pelvis Portable  Result Date: 08/23/2020 CLINICAL DATA:  Status post arthroplasty. EXAM: PORTABLE PELVIS 1-2 VIEWS COMPARISON:  Plain film of the LEFT hip dated 08/22/2020. FINDINGS: Interval LEFT hip arthroplasty. Hardware appears intact and appropriately positioned. Expected postsurgical changes within the overlying soft tissues. IMPRESSION: Status post LEFT hip arthroplasty. Hardware appears intact and appropriately positioned. Electronically Signed   By: Franki Cabot M.D.   On: 08/23/2020 10:31   DG Hip Port Unilat With Pelvis 1V Left  Result Date: 08/23/2020 CLINICAL DATA:  Status post LEFT hip arthroplasty. EXAM: DG HIP (WITH OR WITHOUT PELVIS) 1V PORT LEFT COMPARISON:  LEFT hip plain film dated 08/22/2020. FINDINGS: Interval LEFT hip arthroplasty. Hardware appears intact and appropriately positioned. Osseous alignment is anatomic. Expected postsurgical changes seen within the overlying soft tissues. IMPRESSION: Status post LEFT hip arthroplasty. No evidence of surgical complicating feature. Electronically Signed   By: Franki Cabot M.D.   On: 08/23/2020 10:30   DG Hip Port Unilat With Pelvis 1V Left  Final Result    Pelvis Portable   Final Result    CT Head Wo Contrast  Final Result    DG Hip Unilat With Pelvis 2-3 Views Left  Final Result      Scheduled Meds:  vitamin C  500 mg Oral Daily   carbidopa-levodopa  1.5 tablet Oral TID   docusate sodium  100 mg Oral BID   enoxaparin (LOVENOX) injection  40 mg Subcutaneous Q24H   FLUoxetine  80 mg Oral Daily   pramipexole  0.5 mg Oral TID   QUEtiapine  25 mg Oral QHS   sodium chloride flush  3 mL Intravenous Q12H   PRN Meds: HYDROcodone-acetaminophen, HYDROcodone-acetaminophen, menthol-cetylpyridinium **OR**  phenol, morphine injection, ondansetron **OR** ondansetron (ZOFRAN) IV Continuous Infusions:  sodium chloride Stopped (08/24/20 0602)     LOS: 2 days  Time spent: Greater than 50% of the 35 minute visit was spent in counseling/coordination of care for the patient as laid out in the A&P.   Dwyane Dee, MD [facility] 08/24/2020, 1:56 PM

## 2020-08-24 NOTE — TOC Initial Note (Signed)
Transition of Care [facility]) - Initial/Assessment Note    Patient Details  Name: ADDILYNNE OLHEISER MRN: [id number] Date of Birth: March 24, 1945  Transition of Care [facility]) CM/SW Contact:    Lennart Pall, LCSW Phone Number: 08/24/2020, 3:56 PM  Clinical Narrative:                 Met with pt today and spoke with step-daughter, Rosemarie Ax to review dc plans.  Pt confirms that she has spoken with therapists today and is in full agreement with recommendation for SNF rehab.  Step-daughter is, also, in agreement.  Pt is very hopeful this will be short term and is very motivated to regain her independence.  We have discussed facility preferences.  I have started insurance authorization [place] with [employer]).  FL2 out to area facilities.  Will ask oncoming TOC to follow up with bed offers.  Expected Discharge Plan: Skilled Nursing Facility Barriers to Discharge: Ship broker, Continued Medical Work up   Patient Goals and CMS Choice Patient states their goals for this hospitalization and ongoing recovery are:: return home following rehab      Expected Discharge Plan and Services Expected Discharge Plan: [place] In-house Referral: Clinical Social Work     Living arrangements for the past 2 months: Single Family Home                 DME Arranged: N/A DME Agency: NA                  Prior Living Arrangements/Services Living arrangements for the past 2 months: [place] Lives with:: Self Patient language and need for interpreter reviewed:: Yes Do you feel safe going back to the place where you live?: Yes      Need for Family Participation in Patient Care: No (Comment) Care giver support system in place?: Yes (comment)   Criminal Activity/Legal Involvement Pertinent to Current Situation/Hospitalization: No - Comment as needed  Activities of Daily Living Home Assistive Devices/Equipment: Eyeglasses, Cane (specify quad or straight) (single point cane) ADL  Screening (condition at time of admission) Patient's cognitive ability adequate to safely complete daily activities?: Yes Is the patient deaf or have difficulty hearing?: No Does the patient have difficulty seeing, even when wearing glasses/contacts?: No Does the patient have difficulty concentrating, remembering, or making decisions?: Yes (at times) Patient able to express need for assistance with ADLs?: Yes Does the patient have difficulty dressing or bathing?: Yes Independently performs ADLs?: No Communication: Independent Dressing (OT): Needs assistance Is this a change from baseline?: Change from baseline, expected to last >3 days Grooming: Needs assistance Is this a change from baseline?: Change from baseline, expected to last >3 days Feeding: Needs assistance Is this a change from baseline?: Change from baseline, expected to last >3 days Bathing: Needs assistance Is this a change from baseline?: Change from baseline, expected to last >3 days Toileting: Needs assistance Is this a change from baseline?: Change from baseline, expected to last >3days In/Out Bed: Needs assistance Is this a change from baseline?: Change from baseline, expected to last >3 days Walks in Home: Needs assistance Is this a change from baseline?: Change from baseline, expected to last >3 days Does the patient have difficulty walking or climbing stairs?: Yes (secondary to left hip injury) Weakness of Legs: Left Weakness of Arms/Hands: None  Permission Sought/Granted Permission sought to share information with : Family Supports Permission granted to share information with : Yes, Verbal Permission Granted  Share Information with  NAME: Laveta Gilkey     Permission granted to share info w Relationship: step-daughter  Permission granted to share info w Contact Information: [phone]  Emotional Assessment Appearance:: Appears stated age Attitude/Demeanor/Rapport: Gracious, Engaged Affect (typically  observed): Accepting Orientation: : Oriented to Self, Oriented to Place, Oriented to  Time, Oriented to Situation Alcohol / Substance Use: Not Applicable Psych Involvement: No (comment)  Admission diagnosis:  Fracture of femoral neck, left ([place]) [S72.002A] Patient Active Problem List   Diagnosis Date Noted   Fracture of femoral neck, left ([place]) 08/22/2020   Hypertension    Depression    Osteoporosis    Parkinsonism ([place]) 10/14/2015   Tremor 10/14/2015   Memory change 10/14/2015   PCP:  Shon Baton, MD Pharmacy:   [facility] [place] - [address] [place] [address] [place] [address] [place] [address] Phone: [phone] Fax: [phone]     Social Determinants of Health (SDOH) Interventions    Readmission Risk Interventions No flowsheet data found.

## 2020-08-24 NOTE — Plan of Care (Signed)
  Problem: Activity: Goal: Risk for activity intolerance will decrease Outcome: Progressing   

## 2020-08-25 DIAGNOSIS — Z96642 Presence of left artificial hip joint: Secondary | ICD-10-CM | POA: Diagnosis not present

## 2020-08-25 DIAGNOSIS — R531 Weakness: Secondary | ICD-10-CM | POA: Diagnosis not present

## 2020-08-25 DIAGNOSIS — G20 Parkinson's disease: Secondary | ICD-10-CM | POA: Diagnosis not present

## 2020-08-25 DIAGNOSIS — R2681 Unsteadiness on feet: Secondary | ICD-10-CM | POA: Diagnosis not present

## 2020-08-25 DIAGNOSIS — S72002A Fracture of unspecified part of neck of left femur, initial encounter for closed fracture: Secondary | ICD-10-CM | POA: Diagnosis not present

## 2020-08-25 DIAGNOSIS — R5383 Other fatigue: Secondary | ICD-10-CM | POA: Diagnosis not present

## 2020-08-25 DIAGNOSIS — R410 Disorientation, unspecified: Secondary | ICD-10-CM

## 2020-08-25 DIAGNOSIS — S72002D Fracture of unspecified part of neck of left femur, subsequent encounter for closed fracture with routine healing: Secondary | ICD-10-CM | POA: Diagnosis not present

## 2020-08-25 DIAGNOSIS — R262 Difficulty in walking, not elsewhere classified: Secondary | ICD-10-CM | POA: Diagnosis not present

## 2020-08-25 DIAGNOSIS — U071 COVID-19: Secondary | ICD-10-CM | POA: Diagnosis not present

## 2020-08-25 DIAGNOSIS — Z9181 History of falling: Secondary | ICD-10-CM | POA: Diagnosis not present

## 2020-08-25 DIAGNOSIS — M810 Age-related osteoporosis without current pathological fracture: Secondary | ICD-10-CM | POA: Diagnosis not present

## 2020-08-25 DIAGNOSIS — M6281 Muscle weakness (generalized): Secondary | ICD-10-CM | POA: Diagnosis not present

## 2020-08-25 DIAGNOSIS — F330 Major depressive disorder, recurrent, mild: Secondary | ICD-10-CM | POA: Diagnosis not present

## 2020-08-25 DIAGNOSIS — I159 Secondary hypertension, unspecified: Secondary | ICD-10-CM | POA: Diagnosis not present

## 2020-08-25 DIAGNOSIS — S72012A Unspecified intracapsular fracture of left femur, initial encounter for closed fracture: Secondary | ICD-10-CM | POA: Diagnosis not present

## 2020-08-25 DIAGNOSIS — R5381 Other malaise: Secondary | ICD-10-CM | POA: Diagnosis not present

## 2020-08-25 DIAGNOSIS — Z7401 Bed confinement status: Secondary | ICD-10-CM | POA: Diagnosis not present

## 2020-08-25 DIAGNOSIS — R41841 Cognitive communication deficit: Secondary | ICD-10-CM | POA: Diagnosis not present

## 2020-08-25 DIAGNOSIS — Z20822 Contact with and (suspected) exposure to covid-19: Secondary | ICD-10-CM | POA: Diagnosis not present

## 2020-08-25 DIAGNOSIS — I10 Essential (primary) hypertension: Secondary | ICD-10-CM | POA: Diagnosis not present

## 2020-08-25 DIAGNOSIS — E7800 Pure hypercholesterolemia, unspecified: Secondary | ICD-10-CM | POA: Diagnosis not present

## 2020-08-25 LAB — CBC WITH DIFFERENTIAL/PLATELET
Abs Immature Granulocytes: 0.05 10*3/uL (ref 0.00–0.07)
Basophils Absolute: 0 10*3/uL (ref 0.0–0.1)
Basophils Relative: 0 %
Eosinophils Absolute: 0.1 10*3/uL (ref 0.0–0.5)
Eosinophils Relative: 1 %
HCT: 33.3 % — ABNORMAL LOW (ref 36.0–46.0)
Hemoglobin: 10.9 g/dL — ABNORMAL LOW (ref 12.0–15.0)
Immature Granulocytes: 0 %
Lymphocytes Relative: 8 %
Lymphs Abs: 0.9 10*3/uL (ref 0.7–4.0)
MCH: 31.3 pg (ref 26.0–34.0)
MCHC: 32.7 g/dL (ref 30.0–36.0)
MCV: 95.7 fL (ref 80.0–100.0)
Monocytes Absolute: 0.8 10*3/uL (ref 0.1–1.0)
Monocytes Relative: 7 %
Neutro Abs: 9.4 10*3/uL — ABNORMAL HIGH (ref 1.7–7.7)
Neutrophils Relative %: 84 %
Platelets: 218 10*3/uL (ref 150–400)
RBC: 3.48 MIL/uL — ABNORMAL LOW (ref 3.87–5.11)
RDW: 12.6 % (ref 11.5–15.5)
WBC: 11.2 10*3/uL — ABNORMAL HIGH (ref 4.0–10.5)
nRBC: 0 % (ref 0.0–0.2)

## 2020-08-25 LAB — BASIC METABOLIC PANEL
Anion gap: 11 (ref 5–15)
BUN: 17 mg/dL (ref 8–23)
CO2: 23 mmol/L (ref 22–32)
Calcium: 8.6 mg/dL — ABNORMAL LOW (ref 8.9–10.3)
Chloride: 103 mmol/L (ref 98–111)
Creatinine, Ser: 0.78 mg/dL (ref 0.44–1.00)
GFR, Estimated: >60 mL/min (ref >60)
Glucose, Bld: 116 mg/dL — ABNORMAL HIGH (ref 70–99)
Potassium: 3.5 mmol/L (ref 3.5–5.1)
Sodium: 137 mmol/L (ref 135–145)

## 2020-08-25 LAB — RESP PANEL BY RT-PCR (FLU A&B, COVID) ARPGX2
Influenza A by PCR: NEGATIVE
Influenza B by PCR: NEGATIVE
SARS Coronavirus 2 by RT PCR: NEGATIVE

## 2020-08-25 LAB — MAGNESIUM: Magnesium: 1.8 mg/dL (ref 1.7–2.4)

## 2020-08-25 MED ORDER — HALOPERIDOL LACTATE 5 MG/ML IJ SOLN
2.0000 mg | Freq: Once | INTRAMUSCULAR | Status: AC
  Administered 2020-08-25: 2 mg via INTRAVENOUS
  Filled 2020-08-25: qty 1

## 2020-08-25 MED ORDER — HYDROCODONE-ACETAMINOPHEN 5-325 MG PO TABS: 1.0000 | ORAL_TABLET | Freq: Four times a day (QID) | ORAL | Status: DC | PRN

## 2020-08-25 MED ORDER — ACETAMINOPHEN 500 MG PO TABS: 500.0000 mg | ORAL_TABLET | Freq: Four times a day (QID) | ORAL | 0 refills | Status: DC | PRN

## 2020-08-25 NOTE — Assessment & Plan Note (Signed)
-   Patient developed acute hospital associated delirium on the night of 08/24/2020; this resolved slowly during the morning of 08/25/2020 -Expect to continue to improve as she reacclimate's to new surroundings

## 2020-08-25 NOTE — Care Management Important Message (Signed)
Important Message  Patient Details IM Letter given to the Patient. Name: Patricia Fitzpatrick MRN: [id number] Date of Birth: 14-Oct-1945   Medicare Important Message Given:  Yes     Kerin Salen 08/25/2020, 10:57 AM

## 2020-08-25 NOTE — Discharge Summary (Signed)
Physician Discharge Summary   Patricia Fitzpatrick KCL:[id number] DOB: July 18, 1945 DOA: 08/22/2020  PCP: Shon Baton, MD  Admit date: 08/22/2020 Discharge date:  08/25/2020  Admitted From: home Disposition:  SNF Discharging physician: Dwyane Dee, MD  Recommendations for Outpatient Follow-up:  Continue Lovenox for 30 days per Ortho recommendations Follow-up with orthopedic surgery in 2 weeks Monitor for any further development of delirium  Home Health:  Equipment/Devices:   Patient discharged to SNF in Discharge Condition: stable Risk of unplanned readmission score: Unplanned Admission- Pilot do not use: 15.49  CODE STATUS: Full Diet recommendation:  Diet Orders (From admission, onward)     Start     Ordered   08/25/20 0000  Diet - low sodium heart healthy        08/25/20 1242   08/23/20 1042  Diet regular Room service appropriate? Yes; Fluid consistency: Thin  Diet effective now       Question Answer Comment  Room service appropriate? Yes   Fluid consistency: Thin      08/23/20 1041            Hospital Course: [name] is a 75 yo female with PMH Parkinson's disease, osteoporosis, depression/anxiety, hypertension, hyperlipidemia who presented to the hospital after a mechanical accidental fall at home.  She states she was working on making breakfast this morning when she saw a bug on the floor and attempted to throw a piece of bread at it and somehow lost her footing and fell onto her left side on the floor.  X-ray obtained in the ER showed and acute subcapital left femoral neck fracture with mild impaction and internal rotation of the distal fracture fragments. CT head was also obtained which was unremarkable.  She is admitted for orthopedic surgery evaluation for tentative repair. She was evaluated and taken to the OR on 08/23/2020 and underwent left hip hemiarthroplasty.  She tolerated surgery well.  * Fracture of femoral neck, left (HCC) - s/p mechanical fall at home; does not  sound like any syncope or prodrome of such; med list reviewed and seems appropriate for her level of Parkinson's as well - s/p left hip hemiarthroplasty on 08/23/2020 - appreciate PT/OT evals. Rec is SNF - Continue pain control - continue lovenox for DVT ppx at discharge; 30 day prescription provided by ortho at discharge   Osteoporosis - Previously on Fosamax.  She states that she is about to start Prolia outpatient instead  Delirium - Patient developed acute hospital associated delirium on the night of 08/24/2020; this resolved slowly during the morning of 08/25/2020 -Expect to continue to improve as she reacclimate's to new surroundings  Depression - Continue Seroquel and Prozac  Hypertension - Not on home medications.  Blood pressure controlled.  Treat pain first for any BP elevations  Parkinsonism (HCC) - Continue Sinemet    Principal Diagnosis: Fracture of femoral neck, left [facility])  Discharge Diagnoses: Active Hospital Problems   Diagnosis Date Noted   Fracture of femoral neck, left ([place]) 08/22/2020    Priority: High   Osteoporosis     Priority: Medium   Delirium 08/25/2020   Hypertension    Depression    Parkinsonism ([place]) 10/14/2015    Resolved Hospital Problems  No resolved problems to display.    Discharge Instructions     Diet - low sodium heart healthy   Complete by: As directed    Discharge wound care:   Complete by: As directed    Continue surgical dressing and reinforce as needed  Increase activity slowly   Complete by: As directed       Allergies as of 08/25/2020   No Known Allergies      Medication List     TAKE these medications    acetaminophen 500 MG tablet Commonly known as: TYLENOL Take 1 tablet (500 mg total) by mouth every 6 (six) hours as needed for moderate pain. Hold while on norco What changed: additional instructions   alendronate 70 MG tablet Commonly known as: FOSAMAX Take 70 mg by mouth once a week. With 8 ounces of  water needs to remain upright for 30 minutes and no food or drink for 30 minutes   carbidopa-levodopa 25-100 MG tablet Commonly known as: SINEMET IR Take 1.5 tablets by mouth 3 (three) times daily.   enoxaparin 40 MG/0.4ML injection Commonly known as: LOVENOX Inject 0.4 mLs (40 mg total) into the skin daily.   FLUoxetine 20 MG capsule Commonly known as: PROZAC Take 80 mg by mouth daily.   HYDROcodone-acetaminophen 5-325 MG tablet Commonly known as: Norco Take 1 tablet by mouth every 6 (six) hours as needed for severe pain.   pramipexole 0.5 MG tablet Commonly known as: MIRAPEX TAKE 1 TABLET(0.5 MG) BY MOUTH THREE TIMES DAILY What changed:  how much to take how to take this when to take this   QUEtiapine 25 MG tablet Commonly known as: SEROQUEL TAKE 1 TABLET(25 MG) BY MOUTH AT BEDTIME   vitamin C 500 MG tablet Commonly known as: ASCORBIC ACID Take 500 mg by mouth daily.               Discharge Care Instructions  (From admission, onward)           Start     Ordered   08/25/20 0000  Discharge wound care:       Comments: Continue surgical dressing and reinforce as needed   08/25/20 1242            Contact information for after-discharge care     Destination     [facility] Preferred SNF .   Service: Skilled Nursing Contact information: [address] [place]                    No Known Allergies  Consultations: Ortho  Discharge Exam: BP (!) 159/79 (BP Location: Left Arm)   Pulse 98   Temp 98.4 F (36.9 C) (Oral)   Resp (!) 24   Ht 4\' 11"  (1.499 m)   Wt 50.9 kg   SpO2 97%   BMI 22.66 kg/m  General appearance: Still slightly confused this morning but was starting to improve during conversation Head: Normocephalic, without obvious abnormality, atraumatic Eyes:  EOMI Lungs: clear to auscultation bilaterally Heart: regular rate and rhythm and S1, S2 normal Abdomen:  normal findings: bowel sounds normal and soft, non-tender Extremities: Surgical dressing in place with ice pack over.  Compartments soft Skin: mobility and turgor normal Neurologic: LLE limited by pain; no other focal deficits otherwise  The results of significant diagnostics from this hospitalization (including imaging, microbiology, ancillary and laboratory) are listed below for reference.   Microbiology: Recent Results (from the past 240 hour(s))  Resp Panel by RT-PCR (Flu A&B, Covid) Nasopharyngeal Swab     Status: None   Collection Time: 08/22/20  2:48 PM   Specimen: Nasopharyngeal Swab; Nasopharyngeal(NP) swabs in vial transport medium  Result Value Ref Range Status   SARS Coronavirus 2 by RT  PCR NEGATIVE NEGATIVE Final    Comment: (NOTE) SARS-CoV-2 target nucleic acids are NOT DETECTED.  The SARS-CoV-2 RNA is generally detectable in upper respiratory specimens during the acute phase of infection. The lowest concentration of SARS-CoV-2 viral copies this assay can detect is 138 copies/mL. A negative result does not preclude SARS-Cov-2 infection and should not be used as the sole basis for treatment or other patient management decisions. A negative result may occur with  improper specimen collection/handling, submission of specimen other than nasopharyngeal swab, presence of viral mutation(s) within the areas targeted by this assay, and inadequate number of viral copies(<138 copies/mL). A negative result must be combined with clinical observations, patient history, and epidemiological information. The expected result is Negative.  Fact Sheet for Patients:  EntrepreneurPulse.com.au  Fact Sheet for Healthcare Providers:  IncredibleEmployment.be  This test is no t yet approved or cleared by the [place] FDA and  has been authorized for detection and/or diagnosis of SARS-CoV-2 by FDA under an Emergency Use Authorization (EUA). This EUA will  remain  in effect (meaning this test can be used) for the duration of the COVID-19 declaration under Section 564(b)(1) of the Act, 21 U.S.C.section 360bbb-3(b)(1), unless the authorization is terminated  or revoked sooner.       Influenza A by PCR NEGATIVE NEGATIVE Final   Influenza B by PCR NEGATIVE NEGATIVE Final    Comment: (NOTE) The Xpert Xpress SARS-CoV-2/FLU/RSV plus assay is intended as an aid in the diagnosis of influenza from Nasopharyngeal swab specimens and should not be used as a sole basis for treatment. Nasal washings and aspirates are unacceptable for Xpert Xpress SARS-CoV-2/FLU/RSV testing.  Fact Sheet for Patients: EntrepreneurPulse.com.au  Fact Sheet for Healthcare Providers: IncredibleEmployment.be  This test is not yet approved or cleared by the [place] FDA and has been authorized for detection and/or diagnosis of SARS-CoV-2 by FDA under an Emergency Use Authorization (EUA). This EUA will remain in effect (meaning this test can be used) for the duration of the COVID-19 declaration under Section 564(b)(1) of the Act, 21 U.S.C. section 360bbb-3(b)(1), unless the authorization is terminated or revoked.  Performed at [facility], [place] [address] [place], [place] [address]   MRSA Next Gen by PCR, Nasal     Status: None   Collection Time: 08/23/20  3:30 AM   Specimen: Nasal Mucosa; Nasal Swab  Result Value Ref Range Status   MRSA by PCR Next Gen NOT DETECTED NOT DETECTED Final    Comment: (NOTE) The GeneXpert MRSA Assay (FDA approved for NASAL specimens only), is one component of a comprehensive MRSA colonization surveillance program. It is not intended to diagnose MRSA infection nor to guide or monitor treatment for MRSA infections. Test performance is not FDA approved in patients less than 82 years old. Performed at [facility], [place] [address] [place], [place] [address]       Labs: BNP (last 3 results) No results for input(s): BNP in the last 8760 hours. Basic Metabolic Panel: Recent Labs  Lab 08/22/20 1329 08/23/20 0203 08/24/20 0246 08/25/20 0328  NA 138 137 138 137  K 4.5 4.4 3.8 3.5  CL 107 103 107 103  CO2 24 25 26 23   GLUCOSE 89 114* 120* 116*  BUN 22 24* 21 17  CREATININE 0.80 0.90 0.80 0.78  CALCIUM 9.2 8.8* 8.0* 8.6*  MG  --  2.1 1.8 1.8   Liver Function Tests: Recent Labs  Lab 08/22/20 1329  AST 28  ALT  6  ALKPHOS 60  BILITOT 0.8  PROT 6.9  ALBUMIN 4.1   No results for input(s): LIPASE, AMYLASE in the last 168 hours. No results for input(s): AMMONIA in the last 168 hours. CBC: Recent Labs  Lab 08/22/20 1329 08/23/20 0203 08/24/20 0626 08/25/20 0328  WBC 12.1* 9.6 9.0 11.2*  NEUTROABS 10.8* 8.1* 7.2 9.4*  HGB 14.2 12.5 9.1* 10.9*  HCT 43.4 39.0 28.3* 33.3*  MCV 95.0 95.6 96.6 95.7  PLT 293 253 173 218   Cardiac Enzymes: No results for input(s): CKTOTAL, CKMB, CKMBINDEX, TROPONINI in the last 168 hours. BNP: Invalid input(s): POCBNP CBG: No results for input(s): GLUCAP in the last 168 hours. D-Dimer No results for input(s): DDIMER in the last 72 hours. Hgb A1c No results for input(s): HGBA1C in the last 72 hours. Lipid Profile No results for input(s): CHOL, HDL, LDLCALC, TRIG, CHOLHDL, LDLDIRECT in the last 72 hours. Thyroid function studies No results for input(s): TSH, T4TOTAL, T3FREE, THYROIDAB in the last 72 hours.  Invalid input(s): FREET3 Anemia work up No results for input(s): VITAMINB12, FOLATE, FERRITIN, TIBC, IRON, RETICCTPCT in the last 72 hours. Urinalysis    Component Value Date/Time   COLORURINE YELLOW 07/18/2020 2015   APPEARANCEUR CLEAR 07/18/2020 2015   LABSPEC 1.025 07/18/2020 2015   PHURINE 5.0 07/18/2020 2015   GLUCOSEU NEGATIVE 07/18/2020 2015   HGBUR SMALL (A) 07/18/2020 2015   BILIRUBINUR NEGATIVE 07/18/2020 2015   KETONESUR 15 (A) 07/18/2020 2015   PROTEINUR TRACE (A)  07/18/2020 2015   NITRITE NEGATIVE 07/18/2020 2015   LEUKOCYTESUR TRACE (A) 07/18/2020 2015   Sepsis Labs Invalid input(s): PROCALCITONIN,  WBC,  LACTICIDVEN Microbiology Recent Results (from the past 240 hour(s))  Resp Panel by RT-PCR (Flu A&B, Covid) Nasopharyngeal Swab     Status: None   Collection Time: 08/22/20  2:48 PM   Specimen: Nasopharyngeal Swab; Nasopharyngeal(NP) swabs in vial transport medium  Result Value Ref Range Status   SARS Coronavirus 2 by RT PCR NEGATIVE NEGATIVE Final    Comment: (NOTE) SARS-CoV-2 target nucleic acids are NOT DETECTED.  The SARS-CoV-2 RNA is generally detectable in upper respiratory specimens during the acute phase of infection. The lowest concentration of SARS-CoV-2 viral copies this assay can detect is 138 copies/mL. A negative result does not preclude SARS-Cov-2 infection and should not be used as the sole basis for treatment or other patient management decisions. A negative result may occur with  improper specimen collection/handling, submission of specimen other than nasopharyngeal swab, presence of viral mutation(s) within the areas targeted by this assay, and inadequate number of viral copies(<138 copies/mL). A negative result must be combined with clinical observations, patient history, and epidemiological information. The expected result is Negative.  Fact Sheet for Patients:  EntrepreneurPulse.com.au  Fact Sheet for Healthcare Providers:  IncredibleEmployment.be  This test is no t yet approved or cleared by the [place] FDA and  has been authorized for detection and/or diagnosis of SARS-CoV-2 by FDA under an Emergency Use Authorization (EUA). This EUA will remain  in effect (meaning this test can be used) for the duration of the COVID-19 declaration under Section 564(b)(1) of the Act, 21 U.S.C.section 360bbb-3(b)(1), unless the authorization is terminated  or revoked sooner.        Influenza A by PCR NEGATIVE NEGATIVE Final   Influenza B by PCR NEGATIVE NEGATIVE Final    Comment: (NOTE) The Xpert Xpress SARS-CoV-2/FLU/RSV plus assay is intended as an aid in the diagnosis of influenza from Nasopharyngeal swab specimens  and should not be used as a sole basis for treatment. Nasal washings and aspirates are unacceptable for Xpert Xpress SARS-CoV-2/FLU/RSV testing.  Fact Sheet for Patients: EntrepreneurPulse.com.au  Fact Sheet for Healthcare Providers: IncredibleEmployment.be  This test is not yet approved or cleared by the [place] FDA and has been authorized for detection and/or diagnosis of SARS-CoV-2 by FDA under an Emergency Use Authorization (EUA). This EUA will remain in effect (meaning this test can be used) for the duration of the COVID-19 declaration under Section 564(b)(1) of the Act, 21 U.S.C. section 360bbb-3(b)(1), unless the authorization is terminated or revoked.  Performed at [facility], [place] [address] [place], [place] [address]   MRSA Next Gen by PCR, Nasal     Status: None   Collection Time: 08/23/20  3:30 AM   Specimen: Nasal Mucosa; Nasal Swab  Result Value Ref Range Status   MRSA by PCR Next Gen NOT DETECTED NOT DETECTED Final    Comment: (NOTE) The GeneXpert MRSA Assay (FDA approved for NASAL specimens only), is one component of a comprehensive MRSA colonization surveillance program. It is not intended to diagnose MRSA infection nor to guide or monitor treatment for MRSA infections. Test performance is not FDA approved in patients less than 58 years old. Performed at [facility], [place] [address] [place], [place] [address]     Procedures/Studies: CT Head Wo Contrast  Result Date: 08/22/2020 CLINICAL DATA:  Head injury after fall. EXAM: CT HEAD WITHOUT CONTRAST TECHNIQUE: Contiguous axial images were obtained from the base of the skull through the vertex  without intravenous contrast. COMPARISON:  July 18, 2020. FINDINGS: Brain: No evidence of acute infarction, hemorrhage, hydrocephalus, extra-axial collection or mass lesion/mass effect. Vascular: No hyperdense vessel or unexpected calcification. Skull: Normal. Negative for fracture or focal lesion. Sinuses/Orbits: No acute finding. Other: None. IMPRESSION: No acute intracranial abnormality seen. Electronically Signed   By: Marijo Conception M.D.   On: 08/22/2020 13:52   Pelvis Portable  Result Date: 08/23/2020 CLINICAL DATA:  Status post arthroplasty. EXAM: PORTABLE PELVIS 1-2 VIEWS COMPARISON:  Plain film of the LEFT hip dated 08/22/2020. FINDINGS: Interval LEFT hip arthroplasty. Hardware appears intact and appropriately positioned. Expected postsurgical changes within the overlying soft tissues. IMPRESSION: Status post LEFT hip arthroplasty. Hardware appears intact and appropriately positioned. Electronically Signed   By: Franki Cabot M.D.   On: 08/23/2020 10:31   DG Hip Port Unilat With Pelvis 1V Left  Result Date: 08/23/2020 CLINICAL DATA:  Status post LEFT hip arthroplasty. EXAM: DG HIP (WITH OR WITHOUT PELVIS) 1V PORT LEFT COMPARISON:  LEFT hip plain film dated 08/22/2020. FINDINGS: Interval LEFT hip arthroplasty. Hardware appears intact and appropriately positioned. Osseous alignment is anatomic. Expected postsurgical changes seen within the overlying soft tissues. IMPRESSION: Status post LEFT hip arthroplasty. No evidence of surgical complicating feature. Electronically Signed   By: Franki Cabot M.D.   On: 08/23/2020 10:30   DG Hip Unilat With Pelvis 2-3 Views Left  Result Date: 08/22/2020 CLINICAL DATA:  Status post fall. EXAM: DG HIP (WITH OR WITHOUT PELVIS) 2-3V LEFT COMPARISON:  08/26/2018 FINDINGS: Acute subcapital left femoral neck fracture is identified. Mild impaction. Mild internal rotation of the distal fracture fragments. Healed fracture deformity involving the right pubic bone at the  symphysis. Right hip appears intact. IMPRESSION: Acute subcapital left femoral neck fracture with mild impaction and internal rotation of the distal fracture fragments. Electronically Signed   By: Kerby Moors M.D.   On: 08/22/2020  13:36     Time coordinating discharge: Over 30 minutes    Dwyane Dee, MD  [facility] 08/25/2020, 12:46 PM

## 2020-08-25 NOTE — Progress Notes (Signed)
Since beginning of shift patient has been disoriented to place and situation. She said that she was being held hostage and she called 911 multiple times. I spoke with her daughter in law, Patricia Fitzpatrick, who attempted to reorient her but that was not effective. After moving patient at her request to the chair, she wanted to get back into bed around 12am but was convinced she was not in the right room. We were able to transfer her to the bed with assist x2 and a walker and got her settled in for the night. Patient slept for about 2 hours and then became agitated and pulled off purewick, abductor pillow, scd's, and was attempting to climb out of the bottom of the bed. Attempted to reorient patient but was not able to. Patient became combative with staff and was screaming that we were trying to kill her. Mitts were applied due to patient trying to pull out/off her medical interventions and also digging her nails into staff members skin. Message was sent via amion to provider on call. IV haldol given per new order. Vital signs were taken and stable, will continue to monitor.

## 2020-08-25 NOTE — Progress Notes (Signed)
   ORTHOPAEDIC PROGRESS NOTE  s/p Procedure(s): ARTHROPLASTY BIPOLAR HIP (HEMIARTHROPLASTY)  SUBJECTIVE: Patient became very disoriented last night. She thought staff was trying to hurt her and keep her captive. She states she does not know if she can trust me or not. She does not remember who I am. She is very anxious and wants to get out of the hospital. Denies any pain in her hip.   OBJECTIVE: PE: General: confused, sitting up in hospital bed Left lower extremity: dressing CDI, abduction pillow in place, leg lengths equal, intact EHL/TA/GSC, endorses distal sensation, warm well perfused foot   Vitals:   08/24/20 2044 08/25/20 0428  BP: (!) 167/79 (!) 159/79  Pulse: 97 98  Resp: 18 (!) 24  Temp: 99.5 F (37.5 C) 98.4 F (36.9 C)  SpO2: 97% 97%     ASSESSMENT: Patricia Fitzpatrick is a 75 y.o. female - s/p: left hemiarthroplasty on 08/23/20. POD#2  PLAN: Weightbearing: WBAT LLE Insicional and dressing care: Reinforce dressings as needed Orthopedic device(s):  Abduction pillow Showering: Post-op day #2 with assistance VTE prophylaxis: Lovenox 40mg  qd  Pain control: PRN pain medications, minimize narcotics as able. Will discontinue narcotics to try and avoid worsening confusion.  Follow - up plan: 2 weeks in office Dispo: TBD. PT/OT recommending SNF. TOC following Contact information:  Dr. Ophelia Charter, Noemi Chapel PA-C, After hours and holidays please check [email or web address] for group call information for [facility]   Noemi Chapel, PA-C 08/25/2020

## 2020-08-25 NOTE — Plan of Care (Signed)
  Problem: Pain Management: Goal: Pain level will decrease with appropriate interventions Outcome: Progressing   

## 2020-08-25 NOTE — TOC Transition Note (Signed)
Transition of Care [facility]) - CM/SW Discharge Note  Patient Details  Name: MERNA BALDI MRN: [id number] Date of Birth: 12-Sep-1945  [facility] CM/SW Contact:  Sherie Don, LCSW Phone Number: 08/25/2020, 2:19 PM  Clinical Narrative: CSW met with patient and her stepdaughter, Rosemarie Ax, to review bed offers. Family selected [facility]. CSW confirmed bed availabilty with Lexine Baton at [facility]. Patient can come today pending insurance authorization and discharge paperwork.  CSW completed insurance authorization with Marlowe Kays with [facility]m Kaufmann #:[id number]; EMS auth #: [id number]). Patient has been approved for 7 days. Patient will go to room 105 and the number for report is [phone]. Discharge summary, discharge orders, and SNF transfer report faxed to facility in hub. COVID test is negative. Medical necessity form done; PTAR scheduled. Discharge pack completed. RN updated. CSW notified stepdaughter of discharge. TOC signing off.  Final next level of care: Skilled Nursing Facility Barriers to Discharge: Barriers Resolved  Patient Goals and CMS Choice Patient states their goals for this hospitalization and ongoing recovery are:: return home following rehab CMS Medicare.gov Compare Post Acute Care list provided to:: Patient Represenative (must comment) Choice offered to / list presented to : Adult Children, Patient  Discharge Placement PASRR number recieved: 08/24/20         Patient chooses bed at: [facility] Patient to be transferred to facility by: [facility] Name of family member notified: Braxtyn Dorff (stepdaughter) Patient and family notified of of transfer: 08/25/20  Discharge Plan and Services In-house Referral: Clinical Social Work         DME Arranged: N/A DME Agency: NA  Readmission Risk Interventions No flowsheet data found.

## 2020-08-25 NOTE — Progress Notes (Signed)
Pt is alert and oriented this morning.  She states she thinks the night shift "treated me badly".  During this shift she has been calm and quiet and cooperative.  She has had no complaint about any of her care this morning.  She is sitting up in the chair.  She moved slowly with staff and assist.  Daughter is visiting now.

## 2020-08-26 ENCOUNTER — Encounter (HOSPITAL_COMMUNITY): Payer: Self-pay | Admitting: Orthopaedic Surgery

## 2020-08-26 DIAGNOSIS — S72012A Unspecified intracapsular fracture of left femur, initial encounter for closed fracture: Secondary | ICD-10-CM | POA: Diagnosis not present

## 2020-08-26 DIAGNOSIS — I10 Essential (primary) hypertension: Secondary | ICD-10-CM | POA: Diagnosis not present

## 2020-08-26 DIAGNOSIS — M810 Age-related osteoporosis without current pathological fracture: Secondary | ICD-10-CM | POA: Diagnosis not present

## 2020-08-26 DIAGNOSIS — R262 Difficulty in walking, not elsewhere classified: Secondary | ICD-10-CM | POA: Diagnosis not present

## 2020-08-26 NOTE — Progress Notes (Signed)
I received a page to see patient due to her feeling depressed.  She shared about some of her stressors right now and about how difficult this hospitalization had been for her.  I provided prayer, at her request, and listening presence.  [place], [place] Pager, [phone] 11:16 AM

## 2020-08-29 ENCOUNTER — Encounter (HOSPITAL_COMMUNITY): Payer: PPO

## 2020-09-04 DIAGNOSIS — R262 Difficulty in walking, not elsewhere classified: Secondary | ICD-10-CM | POA: Diagnosis not present

## 2020-09-04 DIAGNOSIS — S72012A Unspecified intracapsular fracture of left femur, initial encounter for closed fracture: Secondary | ICD-10-CM | POA: Diagnosis not present

## 2020-09-04 DIAGNOSIS — M810 Age-related osteoporosis without current pathological fracture: Secondary | ICD-10-CM | POA: Diagnosis not present

## 2020-09-04 DIAGNOSIS — G20 Parkinson's disease: Secondary | ICD-10-CM | POA: Diagnosis not present

## 2020-09-05 DIAGNOSIS — S72002D Fracture of unspecified part of neck of left femur, subsequent encounter for closed fracture with routine healing: Secondary | ICD-10-CM | POA: Diagnosis not present

## 2020-09-17 ENCOUNTER — Telehealth: Payer: Self-pay | Admitting: Neurology

## 2020-09-17 NOTE — Telephone Encounter (Signed)
Events noted, I did not call the patient.

## 2020-09-17 NOTE — Telephone Encounter (Signed)
Pt has called to update Dr Jannifer Franklin on what has recently taken place with her. Pt states July 9th she fell in her home, broke left hip.  Pt is in rehab where she contracted a mild case of Covid-19.  Pt states she is improving.

## 2020-09-30 DIAGNOSIS — S72012A Unspecified intracapsular fracture of left femur, initial encounter for closed fracture: Secondary | ICD-10-CM | POA: Diagnosis not present

## 2020-09-30 DIAGNOSIS — R262 Difficulty in walking, not elsewhere classified: Secondary | ICD-10-CM | POA: Diagnosis not present

## 2020-09-30 DIAGNOSIS — F330 Major depressive disorder, recurrent, mild: Secondary | ICD-10-CM | POA: Diagnosis not present

## 2020-09-30 DIAGNOSIS — G20 Parkinson's disease: Secondary | ICD-10-CM | POA: Diagnosis not present

## 2020-10-17 DIAGNOSIS — S72002D Fracture of unspecified part of neck of left femur, subsequent encounter for closed fracture with routine healing: Secondary | ICD-10-CM | POA: Diagnosis not present

## 2020-10-28 DIAGNOSIS — E785 Hyperlipidemia, unspecified: Secondary | ICD-10-CM | POA: Diagnosis not present

## 2020-10-28 DIAGNOSIS — R413 Other amnesia: Secondary | ICD-10-CM | POA: Diagnosis not present

## 2020-10-28 DIAGNOSIS — G20 Parkinson's disease: Secondary | ICD-10-CM | POA: Diagnosis not present

## 2020-10-28 DIAGNOSIS — I10 Essential (primary) hypertension: Secondary | ICD-10-CM | POA: Diagnosis not present

## 2020-10-28 DIAGNOSIS — D692 Other nonthrombocytopenic purpura: Secondary | ICD-10-CM | POA: Diagnosis not present

## 2020-10-28 DIAGNOSIS — I700 Atherosclerosis of aorta: Secondary | ICD-10-CM | POA: Diagnosis not present

## 2020-10-28 DIAGNOSIS — M810 Age-related osteoporosis without current pathological fracture: Secondary | ICD-10-CM | POA: Diagnosis not present

## 2020-10-28 DIAGNOSIS — R2689 Other abnormalities of gait and mobility: Secondary | ICD-10-CM | POA: Diagnosis not present

## 2020-10-28 DIAGNOSIS — R634 Abnormal weight loss: Secondary | ICD-10-CM | POA: Diagnosis not present

## 2020-10-28 DIAGNOSIS — Z9181 History of falling: Secondary | ICD-10-CM | POA: Diagnosis not present

## 2020-10-28 DIAGNOSIS — R443 Hallucinations, unspecified: Secondary | ICD-10-CM | POA: Diagnosis not present

## 2020-10-28 DIAGNOSIS — F419 Anxiety disorder, unspecified: Secondary | ICD-10-CM | POA: Diagnosis not present

## 2020-11-05 DIAGNOSIS — S72012D Unspecified intracapsular fracture of left femur, subsequent encounter for closed fracture with routine healing: Secondary | ICD-10-CM | POA: Diagnosis not present

## 2020-11-05 DIAGNOSIS — R269 Unspecified abnormalities of gait and mobility: Secondary | ICD-10-CM | POA: Diagnosis not present

## 2020-11-05 DIAGNOSIS — M6281 Muscle weakness (generalized): Secondary | ICD-10-CM | POA: Diagnosis not present

## 2020-11-05 DIAGNOSIS — Z96642 Presence of left artificial hip joint: Secondary | ICD-10-CM | POA: Diagnosis not present

## 2020-11-11 ENCOUNTER — Other Ambulatory Visit (HOSPITAL_COMMUNITY): Payer: Self-pay | Admitting: *Deleted

## 2020-11-12 ENCOUNTER — Other Ambulatory Visit: Payer: Self-pay

## 2020-11-12 ENCOUNTER — Ambulatory Visit (HOSPITAL_COMMUNITY)
Admission: RE | Admit: 2020-11-12 | Discharge: 2020-11-12 | Disposition: A | Discharge status: Home / Self Care | Payer: PPO | Source: Ambulatory Visit | Attending: Internal Medicine | Admitting: Internal Medicine

## 2020-11-12 DIAGNOSIS — R269 Unspecified abnormalities of gait and mobility: Secondary | ICD-10-CM | POA: Diagnosis not present

## 2020-11-12 DIAGNOSIS — M810 Age-related osteoporosis without current pathological fracture: Secondary | ICD-10-CM | POA: Diagnosis not present

## 2020-11-12 DIAGNOSIS — M6281 Muscle weakness (generalized): Secondary | ICD-10-CM | POA: Diagnosis not present

## 2020-11-12 DIAGNOSIS — S72012D Unspecified intracapsular fracture of left femur, subsequent encounter for closed fracture with routine healing: Secondary | ICD-10-CM | POA: Diagnosis not present

## 2020-11-12 DIAGNOSIS — Z96642 Presence of left artificial hip joint: Secondary | ICD-10-CM | POA: Diagnosis not present

## 2020-11-12 MED ORDER — DENOSUMAB 60 MG/ML ~~LOC~~ SOSY
PREFILLED_SYRINGE | SUBCUTANEOUS | Status: AC
  Administered 2020-11-12: 60 mg
  Filled 2020-11-12: qty 1

## 2020-11-19 DIAGNOSIS — M6281 Muscle weakness (generalized): Secondary | ICD-10-CM | POA: Diagnosis not present

## 2020-11-19 DIAGNOSIS — Z96642 Presence of left artificial hip joint: Secondary | ICD-10-CM | POA: Diagnosis not present

## 2020-11-19 DIAGNOSIS — S72012D Unspecified intracapsular fracture of left femur, subsequent encounter for closed fracture with routine healing: Secondary | ICD-10-CM | POA: Diagnosis not present

## 2020-11-19 DIAGNOSIS — R269 Unspecified abnormalities of gait and mobility: Secondary | ICD-10-CM | POA: Diagnosis not present

## 2020-11-25 ENCOUNTER — Encounter (HOSPITAL_COMMUNITY): Payer: PPO

## 2020-12-19 ENCOUNTER — Ambulatory Visit: Payer: PPO

## 2020-12-26 ENCOUNTER — Other Ambulatory Visit: Payer: Self-pay | Admitting: Neurology

## 2020-12-29 ENCOUNTER — Other Ambulatory Visit: Payer: Self-pay | Admitting: Diagnostic Neuroimaging

## 2021-01-07 ENCOUNTER — Ambulatory Visit: Payer: PPO | Admitting: Adult Health

## 2021-01-15 ENCOUNTER — Other Ambulatory Visit: Payer: Self-pay | Admitting: Neurology

## 2021-01-20 ENCOUNTER — Ambulatory Visit: Payer: PPO | Admitting: Adult Health

## 2021-03-24 ENCOUNTER — Ambulatory Visit: Payer: PPO | Admitting: Adult Health

## 2021-04-21 ENCOUNTER — Encounter: Payer: Self-pay | Admitting: Adult Health

## 2021-04-21 ENCOUNTER — Ambulatory Visit: Payer: PPO | Admitting: Adult Health

## 2021-04-21 ENCOUNTER — Other Ambulatory Visit: Payer: Self-pay

## 2021-04-21 VITALS — BP 138/82 | HR 68 | Ht 60.0 in | Wt 118.0 lb

## 2021-04-21 DIAGNOSIS — R413 Other amnesia: Secondary | ICD-10-CM

## 2021-04-21 DIAGNOSIS — G20 Parkinson's disease: Secondary | ICD-10-CM | POA: Diagnosis not present

## 2021-04-21 NOTE — Patient Instructions (Signed)
Your Plan: ? ?Continue Sinemet and Mirapex ?Continue monitor memory ?Continue Seroquel at night ? ? ?Thank you for coming to see [place] at [facility]. I hope we have been able to provide you high quality care today. ? ?You may receive a patient satisfaction survey over the next few weeks. We would appreciate your feedback and comments so that we may continue to improve ourselves and the health of our patients. ? ?

## 2021-04-21 NOTE — Progress Notes (Signed)
PATIENT: Patricia Fitzpatrick DOB: [birth year]  REASON FOR VISIT: follow up HISTORY FROM: patient PRIMARY NEUROLOGIST: Dr. Rexene Alberts  HISTORY OF PRESENT ILLNESS: Today 04/21/21:  Patricia Fitzpatrick is a 76 year old female with a history of Parkinson's disease.  She returns today for follow-up.  Overall the patient feels that she has remained stable.  Her last fall was July 2022 and she broke her hip.  She states that she has recovered well.  Has not had any falls since then.  She does use a cane when she is out of her house.  Denies any trouble with her swallowing.  Reports good appetite.  Denies any trouble sleeping.  Denies a tremor.  She feels that her memory has remained stable.  She currently lives in her own home with a stepdaughter and brother.  Patient reports concerns about her stepdaughter not helping financially which she feels that a lot of stress to her.  Her hallucinations have been relatively stable.  She states that she still has them but recognizes that they are not real.  She continues on Seroquel 25 mg at bedtime.  Continues on Sinemet 25-100 mg 1-1/2 tablets 3 times a day and Mirapex 0.5 mg 3 times a day  HISTORY Patricia Fitzpatrick is a 76 year old right-handed white female with a history of Parkinson's disease.  The patient does have a mild memory disturbance.  She lives at home with her brother and her sister-in-law.  The patient is able to drive a car but she does not drive much.  She likes to cook, she keeps track of her own medications and appointments.  She does miss her Sinemet dose at times.  She has had some increasing problems with gait instability.  She fell on 28 April 2020 and went to the emergency room.  She went to the ER on 18 July 2020 with increased tremors of the left leg.  She had been on Wellbutrin around that time and she realized that this was making her nervous and jittery and she has stopped the medication.  The patient however continues to fall, she has had 4 or 5 falls over the last 2  months.  She may have a tendency to lean backwards a bit.  When she is walking, sometimes she will veer to the right.  She has had a recent CT scan of the brain.  She does have hallucinations off and on but this is not frequent, this occurs about once a week.  She uses Seroquel at night.  She returns to the office today for further evaluation.  She is on Mirapex taking 0.5 mg 3 times daily and she is on Sinemet 1.5 tablets of the 25/100 mg Sinemet, 3 times daily.    REVIEW OF SYSTEMS: Out of a complete 14 system review of symptoms, the patient complains only of the following symptoms, and all other reviewed systems are negative.  ALLERGIES: No Known Allergies  HOME MEDICATIONS: Outpatient Medications Prior to Visit  Medication Sig Dispense Refill   acetaminophen (TYLENOL) 500 MG tablet Take 1 tablet (500 mg total) by mouth every 6 (six) hours as needed for moderate pain. Hold while on norco (Patient taking differently: Take 500 mg by mouth in the morning and at bedtime.) 30 tablet 0   carbidopa-levodopa (SINEMET IR) 25-100 MG tablet TAKE 1 AND 1/2 TABLETS BY MOUTH THREE TIMES DAILY 405 tablet 0   Cholecalciferol (VITAMIN D3 PO) Take 50 mcg by mouth daily.     denosumab (PROLIA) 60 MG/ML  SOSY injection every 6 (six) months.     FLUoxetine (PROZAC) 40 MG capsule Take 800 mg by mouth daily.     pramipexole (MIRAPEX) 0.5 MG tablet TAKE 1 TABLET(0.5 MG) BY MOUTH THREE TIMES DAILY 270 tablet 3   QUEtiapine (SEROQUEL) 25 MG tablet TAKE 1 TABLET BY MOUTH AT BEDTIME 30 tablet 0   vitamin C (ASCORBIC ACID) 500 MG tablet Take 500 mg by mouth daily.     alendronate (FOSAMAX) 70 MG tablet Take 70 mg by mouth once a week. With 8 ounces of water needs to remain upright for 30 minutes and no food or drink for 30 minutes (Patient not taking: Reported on 04/21/2021)  2   enoxaparin (LOVENOX) 40 MG/0.4ML injection Inject 0.4 mLs (40 mg total) into the skin daily. (Patient not taking: Reported on 04/21/2021) 12 mL 0    HYDROcodone-acetaminophen (NORCO) 5-325 MG tablet Take 1 tablet by mouth every 6 (six) hours as needed for severe pain. (Patient not taking: Reported on 04/21/2021) 20 tablet 0   FLUoxetine (PROZAC) 20 MG capsule Take 80 mg by mouth daily.   11   No facility-administered medications prior to visit.    PAST MEDICAL HISTORY: Past Medical History:  Diagnosis Date   Anxiety    Cataracts, bilateral    Depression    High cholesterol    Hypertension    OCD (obsessive compulsive disorder)    Parkinsonism ([place]) 10/14/2015   Tremor     PAST SURGICAL HISTORY: Past Surgical History:  Procedure Laterality Date   BREAST BIOPSY  1981   CATARACT EXTRACTION Right 2017   EYE SURGERY  1952   HIP ARTHROPLASTY Left 08/23/2020   Procedure: ARTHROPLASTY BIPOLAR HIP (HEMIARTHROPLASTY);  Surgeon: Hiram Gash, MD;  Location: [facility];  Service: Orthopedics;  Laterality: Left;    FAMILY HISTORY: Family History  Problem Relation Age of Onset   Pulmonary disease Mother    Alzheimer's disease Mother    Heart disease Father    Alzheimer's disease Father     SOCIAL HISTORY: Social History   Socioeconomic History   Marital status: Widowed    Spouse name: Not on file   Number of children: 0   Years of education: Bachelors   Highest education level: Not on file  Occupational History   Occupation: Retired  Tobacco Use   Smoking status: Never   Smokeless tobacco: Never  Substance and Sexual Activity   Alcohol use: Not Currently    Comment: One glass per year. update 04/21/21 pt can't remember her last drink   Drug use: No   Sexual activity: Not on file  Other Topics Concern   Not on file  Social History Narrative   Lives at home, with adult stepdaughter, and brother.   Right-handed.   No caffeine use.   Social Determinants of Health   Financial Resource Strain: Not on file  Food Insecurity: Not on file  Transportation Needs: Not on file  Physical Activity: Not on file  Stress: Not on file   Social Connections: Not on file  Intimate Partner Violence: Not on file      PHYSICAL EXAM  Vitals:   04/21/21 1310  BP: 138/82  Pulse: 68  Weight: 118 lb (53.5 kg)  Height: 5' (1.524 m)   Body mass index is 23.05 kg/m.  MMSE - Mini Mental State Exam 04/21/2021 08/05/2020 11/01/2019  Orientation to time '5 5 5  '$ Orientation to Place '5 5 5  '$ Registration 3 3 3  Attention/ Calculation '2 3 1  '$ Recall '3 1 2  '$ Language- name 2 objects '2 2 2  '$ Language- repeat '1 1 1  '$ Language- follow 3 step command '3 3 3  '$ Language- read & follow direction '1 1 1  '$ Write a sentence '1 1 1  '$ Copy design '1 1 1  '$ Copy design-comments - - 20 animals  Total score '27 26 25     '$ Generalized: Well developed, in no acute distress   Neurological examination  Mentation: Alert oriented to time, place, history taking. Follows all commands speech and language fluent Cranial nerve II-XII: Pupils were equal round reactive to light. Extraocular movements were full, visual field were full on confrontational test. Facial sensation and strength were normal. Uvula tongue midline. Head turning and shoulder shrug  were normal and symmetric. Motor: The motor testing reveals 5 over 5 strength of all 4 extremities. Good symmetric motor tone is noted throughout.  Sensory: Sensory testing is intact to soft touch on all 4 extremities. No evidence of extinction is noted.  Coordination: Cerebellar testing reveals good finger-nose-finger and heel-to-shin bilaterally.  Gait and station: Patient uses a cane when ambulating.  Does not walk well using the cane often holds it without putting on the ground tandem gait not attempted. Reflexes: Deep tendon reflexes are symmetric and normal bilaterally.   DIAGNOSTIC DATA (LABS, IMAGING, TESTING) - I reviewed patient records, labs, notes, testing and imaging myself where available.  Lab Results  Component Value Date   WBC 11.2 (H) 08/25/2020   HGB 10.9 (L) 08/25/2020   HCT 33.3 (L) 08/25/2020    MCV 95.7 08/25/2020   PLT 218 08/25/2020      Component Value Date/Time   NA 137 08/25/2020 0328   K 3.5 08/25/2020 0328   CL 103 08/25/2020 0328   CO2 23 08/25/2020 0328   GLUCOSE 116 (H) 08/25/2020 0328   BUN 17 08/25/2020 0328   CREATININE 0.78 08/25/2020 0328   CALCIUM 8.6 (L) 08/25/2020 0328   PROT 6.9 08/22/2020 1329   ALBUMIN 4.1 08/22/2020 1329   AST 28 08/22/2020 1329   ALT 6 08/22/2020 1329   ALKPHOS 60 08/22/2020 1329   BILITOT 0.8 08/22/2020 1329   GFRNONAA >60 08/25/2020 0328    Lab Results  Component Value Date   VITAMINB12 268 10/14/2015   Lab Results  Component Value Date   TSH 2.250 10/14/2015      ASSESSMENT AND PLAN 76 y.o. year old female  has a past medical history of Anxiety, Cataracts, bilateral, Depression, High cholesterol, Hypertension, OCD (obsessive compulsive disorder), Parkinsonism ([place]) (10/14/2015), and Tremor. here with:  1.  Parkinson's disease 2.  Memory disturbance  Continue Sinemet 25-100 mg 1-1/2 tablets 3 times a day Continue Mirapex 0.5 mg 3 times a day  Continue Seroquel 25 mg at bedtime Memory score is stable we will continue to monitor for now Patient encouraged to stay well-hydrated and be cautious with her gait Advised if symptoms worsen or she develops new symptoms she should let us know Follow-up in 6 months or sooner if needed      Ward Givens, MSN, NP-C 04/21/2021, 1:19 PM [facility] [address] [place], [place] [address] [phone]

## 2021-05-04 ENCOUNTER — Other Ambulatory Visit: Payer: Self-pay | Admitting: Diagnostic Neuroimaging

## 2021-05-05 DIAGNOSIS — E785 Hyperlipidemia, unspecified: Secondary | ICD-10-CM | POA: Diagnosis not present

## 2021-05-05 DIAGNOSIS — I10 Essential (primary) hypertension: Secondary | ICD-10-CM | POA: Diagnosis not present

## 2021-05-05 DIAGNOSIS — R443 Hallucinations, unspecified: Secondary | ICD-10-CM | POA: Diagnosis not present

## 2021-05-05 DIAGNOSIS — F329 Major depressive disorder, single episode, unspecified: Secondary | ICD-10-CM | POA: Diagnosis not present

## 2021-05-05 DIAGNOSIS — R413 Other amnesia: Secondary | ICD-10-CM | POA: Diagnosis not present

## 2021-05-05 DIAGNOSIS — I700 Atherosclerosis of aorta: Secondary | ICD-10-CM | POA: Diagnosis not present

## 2021-05-05 DIAGNOSIS — M810 Age-related osteoporosis without current pathological fracture: Secondary | ICD-10-CM | POA: Diagnosis not present

## 2021-05-05 DIAGNOSIS — D692 Other nonthrombocytopenic purpura: Secondary | ICD-10-CM | POA: Diagnosis not present

## 2021-05-05 DIAGNOSIS — F419 Anxiety disorder, unspecified: Secondary | ICD-10-CM | POA: Diagnosis not present

## 2021-05-05 DIAGNOSIS — R32 Unspecified urinary incontinence: Secondary | ICD-10-CM | POA: Diagnosis not present

## 2021-05-05 DIAGNOSIS — R2689 Other abnormalities of gait and mobility: Secondary | ICD-10-CM | POA: Diagnosis not present

## 2021-05-05 DIAGNOSIS — G20 Parkinson's disease: Secondary | ICD-10-CM | POA: Diagnosis not present

## 2021-05-07 DIAGNOSIS — S7002XA Contusion of left hip, initial encounter: Secondary | ICD-10-CM | POA: Diagnosis not present

## 2021-05-07 DIAGNOSIS — M545 Low back pain, unspecified: Secondary | ICD-10-CM | POA: Diagnosis not present

## 2021-05-13 ENCOUNTER — Telehealth: Payer: Self-pay | Admitting: Adult Health

## 2021-05-13 NOTE — Telephone Encounter (Signed)
Pt called wanting RN to call her back to discuss how the QUEtiapine (SEROQUEL) 25 MG tablet is not helping her with the hallucinations she is having. Please advise. ?

## 2021-05-13 NOTE — Telephone Encounter (Signed)
I called the patient back. She was in pleasant spirits. She said the visual hallucinations seem to getting worse maybe ; a little scarier/unnerving. She knows they are not real but she states she acts like they are. She would like recommendation on her Seroquel. She takes 25 mg QHS. I told her I would call her back.  ?

## 2021-05-14 DIAGNOSIS — M25552 Pain in left hip: Secondary | ICD-10-CM | POA: Diagnosis not present

## 2021-05-14 DIAGNOSIS — L853 Xerosis cutis: Secondary | ICD-10-CM | POA: Diagnosis not present

## 2021-05-14 MED ORDER — QUETIAPINE FUMARATE 25 MG PO TABS: ORAL_TABLET | ORAL | 0 refills | Status: DC

## 2021-05-14 NOTE — Telephone Encounter (Signed)
I spoke to pt and relayed that per Jinny Blossom, NP she can try increasing her seroquel to 1.5 tabs of ('25mg'$  tab)  and see if this helps.  She had better night last night.  She has had some other medical issues (fall about 1-1/2 weeks ago has seen orthoped was given a medication (but not taking any more, will see pcp today).  She will try the increase of seroquel, went over common SE with her.  She has tolerated the '25mg'$  fine.  Will give [place] a call back in a week or so and let us know how she is doing.  She appreciated call back.  ?

## 2021-05-14 NOTE — Addendum Note (Signed)
Addended by: Brandon Melnick on: 05/14/2021 10:06 AM ? ? Modules accepted: Orders ? ?

## 2021-06-25 ENCOUNTER — Telehealth: Payer: Self-pay | Admitting: Adult Health

## 2021-06-25 MED ORDER — QUETIAPINE FUMARATE 25 MG PO TABS: ORAL_TABLET | ORAL | 0 refills | Status: DC

## 2021-06-25 NOTE — Telephone Encounter (Signed)
Pt is asking for a call from [facility], NP to discuss the suggestion of increasing her QUEtiapine (SEROQUEL) 25 MG tablet ?

## 2021-06-25 NOTE — Telephone Encounter (Addendum)
Spoke to pt and she needs refill of her medication of seroquel.  She is almost out.  She will stay the the same dose of 1.5 tablets  of '25mg'$  tabs at bedtime.  Will use [facility] at this time,  may change to [facility] next depending on insurance.  ?

## 2021-07-09 DIAGNOSIS — R0781 Pleurodynia: Secondary | ICD-10-CM | POA: Diagnosis not present

## 2021-07-22 NOTE — Telephone Encounter (Signed)
Pt is asking if she can go back to 1 tablet instead of the 1 1/2 of the  QUEtiapine (SEROQUEL) 25 MG tablet, pt states if Jinny Blossom, NP would prefer she remain on the 1 1/2 please send a prescription to the pharmacy to cover the increased amount. Pt is asking for a call to discuss.

## 2021-07-22 NOTE — Telephone Encounter (Signed)
I called Patricia Fitzpatrick and let her know that MM/NP was out. She was on '25mg'$  po qhs back in March 2023.  She was having more visual hallucination and MM/NP increased to 1.5 tabs po qhs.  Patricia Fitzpatrick has stated that she has not kept record of the hallucinations but will.  She feels like things are different and worse.  I relayed that she can go back to taking '25mg'$  po qhs and see how she does for 1-2 wks.   Will let us know.

## 2021-07-23 MED ORDER — QUETIAPINE FUMARATE 25 MG PO TABS: 25.0000 mg | ORAL_TABLET | Freq: Every day | ORAL | 0 refills | Status: DC

## 2021-07-23 NOTE — Addendum Note (Signed)
Addended by: Brandon Melnick on: 07/23/2021 09:52 AM   Modules accepted: Orders

## 2021-07-23 NOTE — Telephone Encounter (Signed)
Spoke to pt after MM/NP stated ok for taking '25mg'$  po seroquel qhs for several weeks and see how she does.  If no change will consider changing to something else.  Pt verbalized understanding. Will change her prescription

## 2021-07-23 NOTE — Telephone Encounter (Signed)
Agree we can see if she feels better at the lower dose. If it doesn't make a difference we can consider another med

## 2021-08-02 ENCOUNTER — Other Ambulatory Visit: Payer: Self-pay | Admitting: Adult Health

## 2021-08-28 DIAGNOSIS — R7989 Other specified abnormal findings of blood chemistry: Secondary | ICD-10-CM | POA: Diagnosis not present

## 2021-08-28 DIAGNOSIS — E785 Hyperlipidemia, unspecified: Secondary | ICD-10-CM | POA: Diagnosis not present

## 2021-08-28 DIAGNOSIS — R69 Illness, unspecified: Secondary | ICD-10-CM | POA: Diagnosis not present

## 2021-08-28 DIAGNOSIS — I10 Essential (primary) hypertension: Secondary | ICD-10-CM | POA: Diagnosis not present

## 2021-08-28 DIAGNOSIS — M810 Age-related osteoporosis without current pathological fracture: Secondary | ICD-10-CM | POA: Diagnosis not present

## 2021-09-03 ENCOUNTER — Other Ambulatory Visit: Payer: Self-pay

## 2021-09-07 ENCOUNTER — Telehealth: Payer: Self-pay | Admitting: Pharmacy Technician

## 2021-09-07 NOTE — Telephone Encounter (Signed)
Auth Submission: insurance has termed 06/14/21 Payer: [employer] advt Medication & CPT/J Code(s) submitted: Prolia (Denosumab) [id number] Route of submission (phone, fax, portal): phone: [phone] Auth type: Buy/Bill Units/visits requested: x1 dose Reference number: [id number] Approval from:

## 2021-09-08 NOTE — Telephone Encounter (Signed)
New insurance:  [employer] Medicare ID: [id number] PHONE: [phone]

## 2021-09-15 ENCOUNTER — Encounter: Payer: Self-pay | Admitting: Pulmonary Disease

## 2021-09-15 ENCOUNTER — Other Ambulatory Visit: Payer: Self-pay | Admitting: Pharmacy Technician

## 2021-09-15 NOTE — Telephone Encounter (Addendum)
Spoke with [place] from [facility] and patient will get treatment at [facility].   Referral has been cancelled.

## 2021-09-23 ENCOUNTER — Other Ambulatory Visit: Payer: Self-pay | Admitting: Pharmacy Technician

## 2021-10-22 ENCOUNTER — Encounter: Payer: Self-pay | Admitting: Adult Health

## 2021-10-22 ENCOUNTER — Ambulatory Visit: Payer: Medicare HMO | Admitting: Adult Health

## 2021-10-22 VITALS — BP 136/80 | HR 76 | Ht 61.0 in | Wt 109.0 lb

## 2021-10-22 DIAGNOSIS — G20 Parkinson's disease: Secondary | ICD-10-CM

## 2021-10-22 DIAGNOSIS — R413 Other amnesia: Secondary | ICD-10-CM | POA: Diagnosis not present

## 2021-10-22 DIAGNOSIS — G20C Parkinsonism, unspecified: Secondary | ICD-10-CM

## 2021-10-22 NOTE — Progress Notes (Signed)
PATIENT: Patricia Fitzpatrick DOB: 05-10-1945  REASON FOR VISIT: follow up HISTORY FROM: patient PRIMARY NEUROLOGIST: Dr. Rexene Alberts  HISTORY OF PRESENT ILLNESS: Today 10/22/21:   Denies tremors. Reports that she may veer to the left with ambulating. Had a fall about 2 weeks ago. No injuires that she is aware is aware of. She leaves in her own residence. Her stepdaughter and bother live with her. Able to complete all ADLs independently. Manages her own appointments, medications and finances. Still has hallucinations. Not has any in the last 2-3 nights. Reports that she is seeing her grandson at 73-59 years old and have converstations. She recognizes that its not real. Taking seroquel 25 mg 1.5 tablets at bedtime.   04/21/21: Patricia Fitzpatrick is a 76 year old female with a history of Parkinson's disease.  She returns today for follow-up.  Overall the patient feels that she has remained stable.  Her last fall was July 2022 and she broke her hip.  She states that she has recovered well.  Has not had any falls since then.  She does use a cane when she is out of her house.  Denies any trouble with her swallowing.  Reports good appetite.  Denies any trouble sleeping.  Denies a tremor.  She feels that her memory has remained stable.  She currently lives in her own home with a stepdaughter and brother.  Patient reports concerns about her stepdaughter not helping financially which she feels that a lot of stress to her.  Her hallucinations have been relatively stable.  She states that she still has them but recognizes that they are not real.  She continues on Seroquel 25 mg at bedtime.  Continues on Sinemet 25-100 mg 1-1/2 tablets 3 times a day and Mirapex 0.5 mg 3 times a day  HISTORY Patricia Fitzpatrick is a 76 year old right-handed white female with a history of Parkinson's disease.  The patient does have a mild memory disturbance.  She lives at home with her brother and her sister-in-law.  The patient is able to drive a car but she does not  drive much.  She likes to cook, she keeps track of her own medications and appointments.  She does miss her Sinemet dose at times.  She has had some increasing problems with gait instability.  She fell on 28 April 2020 and went to the emergency room.  She went to the ER on 18 July 2020 with increased tremors of the left leg.  She had been on Wellbutrin around that time and she realized that this was making her nervous and jittery and she has stopped the medication.  The patient however continues to fall, she has had 4 or 5 falls over the last 2 months.  She may have a tendency to lean backwards a bit.  When she is walking, sometimes she will veer to the right.  She has had a recent CT scan of the brain.  She does have hallucinations off and on but this is not frequent, this occurs about once a week.  She uses Seroquel at night.  She returns to the office today for further evaluation.  She is on Mirapex taking 0.5 mg 3 times daily and she is on Sinemet 1.5 tablets of the 25/100 mg Sinemet, 3 times daily.    REVIEW OF SYSTEMS: Out of a complete 14 system review of symptoms, the patient complains only of the following symptoms, and all other reviewed systems are negative.  ALLERGIES: No Known Allergies  HOME  MEDICATIONS: Outpatient Medications Prior to Visit  Medication Sig Dispense Refill   acetaminophen (TYLENOL) 500 MG tablet Take 1 tablet (500 mg total) by mouth every 6 (six) hours as needed for moderate pain. Hold while on norco (Patient taking differently: Take 500 mg by mouth in the morning and at bedtime.) 30 tablet 0   alendronate (FOSAMAX) 70 MG tablet Take 70 mg by mouth once a week. With 8 ounces of water needs to remain upright for 30 minutes and no food or drink for 30 minutes (Patient not taking: Reported on 04/21/2021)  2   carbidopa-levodopa (SINEMET IR) 25-100 MG tablet TAKE 1 AND 1/2 TABLETS BY MOUTH THREE TIMES DAILY 405 tablet 0   Cholecalciferol (VITAMIN D3 PO) Take 50 mcg by mouth  daily.     denosumab (PROLIA) 60 MG/ML SOSY injection every 6 (six) months.     enoxaparin (LOVENOX) 40 MG/0.4ML injection Inject 0.4 mLs (40 mg total) into the skin daily. (Patient not taking: Reported on 04/21/2021) 12 mL 0   FLUoxetine (PROZAC) 40 MG capsule Take 800 mg by mouth daily.     HYDROcodone-acetaminophen (NORCO) 5-325 MG tablet Take 1 tablet by mouth every 6 (six) hours as needed for severe pain. (Patient not taking: Reported on 04/21/2021) 20 tablet 0   pramipexole (MIRAPEX) 0.5 MG tablet TAKE 1 TABLET(0.5 MG) BY MOUTH THREE TIMES DAILY 270 tablet 3   QUEtiapine (SEROQUEL) 25 MG tablet Take 1 tablet (25 mg total) by mouth at bedtime. 30 tablet 0   vitamin C (ASCORBIC ACID) 500 MG tablet Take 500 mg by mouth daily.     No facility-administered medications prior to visit.    PAST MEDICAL HISTORY: Past Medical History:  Diagnosis Date   Anxiety    Cataracts, bilateral    Depression    High cholesterol    Hypertension    OCD (obsessive compulsive disorder)    Parkinsonism ([place]) 10/14/2015   Tremor     PAST SURGICAL HISTORY: Past Surgical History:  Procedure Laterality Date   BREAST BIOPSY  1981   CATARACT EXTRACTION Right 2017   EYE SURGERY  1952   HIP ARTHROPLASTY Left 08/23/2020   Procedure: ARTHROPLASTY BIPOLAR HIP (HEMIARTHROPLASTY);  Surgeon: Hiram Gash, MD;  Location: [facility];  Service: Orthopedics;  Laterality: Left;    FAMILY HISTORY: Family History  Problem Relation Age of Onset   Pulmonary disease Mother    Alzheimer's disease Mother    Heart disease Father    Alzheimer's disease Father     SOCIAL HISTORY: Social History   Socioeconomic History   Marital status: Widowed    Spouse name: Not on file   Number of children: 0   Years of education: Bachelors   Highest education level: Not on file  Occupational History   Occupation: Retired  Tobacco Use   Smoking status: Never   Smokeless tobacco: Never  Substance and Sexual Activity   Alcohol use:  Not Currently    Comment: One glass per year. update 04/21/21 pt can't remember her last drink   Drug use: No   Sexual activity: Not on file  Other Topics Concern   Not on file  Social History Narrative   Lives at home, with adult stepdaughter, and brother.   Right-handed.   No caffeine use.   Social Determinants of Health   Financial Resource Strain: Not on file  Food Insecurity: Not on file  Transportation Needs: Not on file  Physical Activity: Not on file  Stress:  Not on file  Social Connections: Not on file  Intimate Partner Violence: Not on file      PHYSICAL EXAM  Vitals:   10/22/21 1333  BP: 136/80  Pulse: 76  Weight: 109 lb (49.4 kg)  Height: '5\' 1"'$  (1.549 m)    Body mass index is 20.6 kg/m.     10/22/2021    1:35 PM 04/21/2021    1:12 PM 08/05/2020    7:41 AM  MMSE - Mini Mental State Exam  Orientation to time '4 5 5  '$ Orientation to Place '5 5 5  '$ Registration '3 3 3  '$ Attention/ Calculation '3 2 3  '$ Recall '3 3 1  '$ Language- name 2 objects '2 2 2  '$ Language- repeat '1 1 1  '$ Language- follow 3 step command '3 3 3  '$ Language- read & follow direction '1 1 1  '$ Write a sentence '1 1 1  '$ Copy design 0 1 1  Total score '26 27 26     '$ Generalized: Well developed, in no acute distress   Neurological examination  Mentation: Alert oriented to time, place, history taking. Follows all commands speech and language fluent Cranial nerve II-XII: Pupils were equal round reactive to light. Extraocular movements were full, visual field were full on confrontational test. Facial sensation and strength were normal.  Head turning and shoulder shrug  were normal and symmetric. Motor: The motor testing reveals 5 over 5 strength of all 4 extremities. Good symmetric motor tone is noted throughout.  Mild impairment of finger and toe taps bilaterally. Sensory: Sensory testing is intact to soft touch on all 4 extremities. No evidence of extinction is noted.  Coordination: Cerebellar testing reveals  good finger-nose-finger and heel-to-shin bilaterally.  Gait and station: Patient uses a cane when ambulating.  Tandem gait not attempted. Reflexes: Deep tendon reflexes are symmetric and normal bilaterally.   DIAGNOSTIC DATA (LABS, IMAGING, TESTING) - I reviewed patient records, labs, notes, testing and imaging myself where available.  Lab Results  Component Value Date   WBC 11.2 (H) 08/25/2020   HGB 10.9 (L) 08/25/2020   HCT 33.3 (L) 08/25/2020   MCV 95.7 08/25/2020   PLT 218 08/25/2020      Component Value Date/Time   NA 137 08/25/2020 0328   K 3.5 08/25/2020 0328   CL 103 08/25/2020 0328   CO2 23 08/25/2020 0328   GLUCOSE 116 (H) 08/25/2020 0328   BUN 17 08/25/2020 0328   CREATININE 0.78 08/25/2020 0328   CALCIUM 8.6 (L) 08/25/2020 0328   PROT 6.9 08/22/2020 1329   ALBUMIN 4.1 08/22/2020 1329   AST 28 08/22/2020 1329   ALT 6 08/22/2020 1329   ALKPHOS 60 08/22/2020 1329   BILITOT 0.8 08/22/2020 1329   GFRNONAA >60 08/25/2020 0328    Lab Results  Component Value Date   VITAMINB12 268 10/14/2015   Lab Results  Component Value Date   TSH 2.250 10/14/2015      ASSESSMENT AND PLAN 76 y.o. year old female  has a past medical history of Anxiety, Cataracts, bilateral, Depression, High cholesterol, Hypertension, OCD (obsessive compulsive disorder), Parkinsonism ([place]) (10/14/2015), and Tremor. here with:  1.  Parkinson's disease 2.  Memory disturbance  Continue Sinemet 25-100 mg 1-1/2 tablets 3 times a day Continue Mirapex 0.5 mg 3 times a day  Continue Seroquel 25 mg 1.5 tablets at bedtime Memory score is stable we will continue to monitor for now Advised if symptoms worsen or she develops new symptoms she should let us know Follow-up  in 6 months or sooner if needed  Previous patient of Dr. Jannifer Franklin.  She requested to be set up with Dr. Leta Baptist as her primary neurologist.      Ward Givens, MSN, NP-C 10/22/2021, 12:31 PM [facility] [address] [place], [place] [address] [phone]

## 2021-10-26 ENCOUNTER — Other Ambulatory Visit: Payer: Self-pay | Admitting: Adult Health

## 2021-10-27 ENCOUNTER — Other Ambulatory Visit: Payer: Self-pay | Admitting: *Deleted

## 2021-10-27 MED ORDER — PRAMIPEXOLE DIHYDROCHLORIDE 0.5 MG PO TABS: ORAL_TABLET | ORAL | 3 refills | Status: DC

## 2021-11-08 ENCOUNTER — Emergency Department (HOSPITAL_BASED_OUTPATIENT_CLINIC_OR_DEPARTMENT_OTHER): Payer: Medicare HMO

## 2021-11-08 ENCOUNTER — Encounter (HOSPITAL_BASED_OUTPATIENT_CLINIC_OR_DEPARTMENT_OTHER): Payer: Self-pay | Admitting: Emergency Medicine

## 2021-11-08 ENCOUNTER — Emergency Department (HOSPITAL_BASED_OUTPATIENT_CLINIC_OR_DEPARTMENT_OTHER): Payer: Medicare HMO | Admitting: Radiology

## 2021-11-08 ENCOUNTER — Emergency Department (HOSPITAL_BASED_OUTPATIENT_CLINIC_OR_DEPARTMENT_OTHER)
Admission: EM | Admit: 2021-11-08 | Discharge: 2021-11-09 | Disposition: A | Discharge status: Home / Self Care | Payer: Medicare HMO | Attending: Emergency Medicine | Admitting: Emergency Medicine

## 2021-11-08 ENCOUNTER — Other Ambulatory Visit: Payer: Self-pay

## 2021-11-08 DIAGNOSIS — W01198A Fall on same level from slipping, tripping and stumbling with subsequent striking against other object, initial encounter: Secondary | ICD-10-CM | POA: Diagnosis not present

## 2021-11-08 DIAGNOSIS — M542 Cervicalgia: Secondary | ICD-10-CM | POA: Diagnosis not present

## 2021-11-08 DIAGNOSIS — S32592A Other specified fracture of left pubis, initial encounter for closed fracture: Secondary | ICD-10-CM

## 2021-11-08 DIAGNOSIS — S3994XA Unspecified injury of external genitals, initial encounter: Secondary | ICD-10-CM | POA: Diagnosis not present

## 2021-11-08 DIAGNOSIS — M25552 Pain in left hip: Secondary | ICD-10-CM | POA: Insufficient documentation

## 2021-11-08 DIAGNOSIS — S32502A Unspecified fracture of left pubis, initial encounter for closed fracture: Secondary | ICD-10-CM | POA: Diagnosis not present

## 2021-11-08 DIAGNOSIS — R519 Headache, unspecified: Secondary | ICD-10-CM | POA: Diagnosis not present

## 2021-11-08 DIAGNOSIS — W19XXXA Unspecified fall, initial encounter: Secondary | ICD-10-CM

## 2021-11-08 DIAGNOSIS — Z043 Encounter for examination and observation following other accident: Secondary | ICD-10-CM | POA: Diagnosis not present

## 2021-11-08 NOTE — ED Notes (Signed)
XRAY at the Bedside. ?

## 2021-11-08 NOTE — ED Notes (Signed)
Transported to Radiology

## 2021-11-08 NOTE — ED Triage Notes (Signed)
Fall about 6pm tonight. Hit head, denies loc. Denies n/v C/o pain in left hip. Full movement  noted.

## 2021-11-08 NOTE — ED Notes (Signed)
Patient transported to [facility] utilizing Wheelchair.

## 2021-11-08 NOTE — ED Provider Notes (Signed)
[facility] EMERGENCY DEPT Provider Note   CSN: [id number] Arrival date & time: 11/08/21  1942     History  Chief Complaint  Patient presents with   Patricia Fitzpatrick    Patricia Fitzpatrick is a 76 y.o. female who presents to the emergency department with concerns for fall onset 6 PM tonight.  Notes that she had a mechanical fall which caused her to hit her left side of her head as well as her left hip.  Denies anticoagulant use.  Ambulates with a walker and was able to walk after the incident.  No meds tried prior to arrival.  Has associated left hip pain, head pain.  Denies dizziness, lightheadedness, LOC, back pain, neck pain, nausea, vomiting, color change, wound.    The history is provided by the patient. No language interpreter was used.       Home Medications Prior to Admission medications   Medication Sig Start Date End Date Taking? Authorizing Provider  acetaminophen (TYLENOL) 500 MG tablet Take 1 tablet (500 mg total) by mouth every 6 (six) hours as needed for moderate pain. Hold while on norco Patient taking differently: Take 500 mg by mouth as needed for mild pain or moderate pain. 08/25/20   Dwyane Dee, MD  alendronate (FOSAMAX) 70 MG tablet Take 70 mg by mouth once a week. With 8 ounces of water needs to remain upright for 30 minutes and no food or drink for 30 minutes 09/23/16   [provider]  carbidopa-levodopa (SINEMET IR) 25-100 MG tablet TAKE 1 AND 1/2 TABLETS BY MOUTH THREE TIMES DAILY 10/26/21   Ward Givens, NP  Cholecalciferol (VITAMIN D3 PO) Take 50 mcg by mouth daily.    [provider]  denosumab (PROLIA) 60 MG/ML SOSY injection every 6 (six) months.    [provider]  enoxaparin (LOVENOX) 40 MG/0.4ML injection Inject 0.4 mLs (40 mg total) into the skin daily. Patient not taking: Reported on 04/21/2021 08/24/20 09/23/20  Ethelda Chick, PA-C  FLUoxetine (PROZAC) 40 MG capsule Take 40 mg by mouth 2 (two) times daily.    [provider]  HYDROcodone-acetaminophen (NORCO) 5-325 MG tablet Take 1 tablet by mouth every 6 (six) hours as needed for severe pain. Patient not taking: Reported on 10/22/2021 08/24/20   Ethelda Chick, PA-C  pramipexole (MIRAPEX) 0.5 MG tablet TAKE 1 TABLET(0.5 MG) BY MOUTH THREE TIMES DAILY 10/27/21   Ward Givens, NP  QUEtiapine (SEROQUEL) 25 MG tablet Take 1 tablet (25 mg total) by mouth at bedtime. 07/23/21   Ward Givens, NP  vitamin C (ASCORBIC ACID) 500 MG tablet Take 500 mg by mouth daily. Patient not taking: Reported on 10/22/2021    [provider]      Allergies    Patient has no known allergies.    Review of Systems   Review of Systems  Gastrointestinal:  Negative for nausea and vomiting.  Musculoskeletal:  Positive for arthralgias. Negative for back pain and neck pain.  Skin:  Negative for color change and wound.  Neurological:  Negative for dizziness, syncope and light-headedness.  All other systems reviewed and are negative.   Physical Exam Updated Vital Signs BP (!) 142/78 (BP Location: Left Arm)   Pulse 75   Temp 98.1 F (36.7 C) (Oral)   Resp 16   Ht '5\' 1"'$  (1.549 m)   Wt 49.9 kg   SpO2 97%   BMI 20.78 kg/m  Physical Exam Vitals and nursing note reviewed.  Constitutional:  General: She is not in acute distress.    Appearance: She is not diaphoretic.  HENT:     Head: Normocephalic and atraumatic.     Mouth/Throat:     Pharynx: No oropharyngeal exudate.  Eyes:     General: No scleral icterus.    Conjunctiva/sclera: Conjunctivae normal.  Cardiovascular:     Rate and Rhythm: Normal rate and regular rhythm.     Pulses: Normal pulses.     Heart sounds: Normal heart sounds.  Pulmonary:     Effort: Pulmonary effort is normal. No respiratory distress.     Breath sounds: Normal breath sounds. No wheezing.  Abdominal:     General: Bowel sounds are normal.     Palpations: Abdomen is soft. There is no mass.     Tenderness: There is no  abdominal tenderness. There is no guarding or rebound.  Musculoskeletal:        General: Normal range of motion.     Cervical back: Normal range of motion and neck supple.     Comments: no spinal tenderness to palpation.  Tenderness to palpation noted to anterior and lateral aspect of left hip.  Full active range of motion of left hip without difficulty.    Skin:    General: Skin is warm and dry.  Neurological:     Mental Status: She is alert.     Comments: No focal neurological deficits.  Strength sensation intact to bilateral upper and lower extremities.  Grip strength 5/5 bilaterally.  Negative pronator drift.    Psychiatric:        Behavior: Behavior normal.     ED Results / Procedures / Treatments   Labs (all labs ordered are listed, but only abnormal results are displayed) Labs Reviewed - No data to display  EKG None  Radiology CT Head Wo Contrast  Result Date: 11/08/2021 CLINICAL DATA:  Head trauma, moderate-severe; Neck trauma (Age >= 65y). Fall about 6pm tonight. Hit head, denies loc. Denies n/v C/o pain in left hip. Full movement noted. EXAM: CT HEAD WITHOUT CONTRAST CT CERVICAL SPINE WITHOUT CONTRAST TECHNIQUE: Multidetector CT imaging of the head and cervical spine was performed following the standard protocol without intravenous contrast. Multiplanar CT image reconstructions of the cervical spine were also generated. RADIATION DOSE REDUCTION: This exam was performed according to the departmental dose-optimization program which includes automated exposure control, adjustment of the mA and/or kV according to patient size and/or use of iterative reconstruction technique. COMPARISON:  CT head 08/22/2020 FINDINGS: CT HEAD FINDINGS BRAIN: BRAIN Cerebral ventricle sizes are concordant with the degree of cerebral volume loss. Patchy and confluent areas of decreased attenuation are noted throughout the deep and periventricular white matter of the cerebral hemispheres bilaterally,  compatible with chronic microvascular ischemic disease. No evidence of large-territorial acute infarction. No parenchymal hemorrhage. No mass lesion. No extra-axial collection. No mass effect or midline shift. No hydrocephalus. Basilar cisterns are patent. Vascular: No hyperdense vessel. Atherosclerotic calcifications are present within the cavernous internal carotid arteries. Skull: No acute fracture or focal lesion. Sinuses/Orbits: Left maxillary mucosal thickening. Otherwise paranasal sinuses and mastoid air cells are clear. Right lens replacement. Otherwise the orbits are unremarkable. Other: Left parietooccipital scalp 5 mm hematoma. CT CERVICAL SPINE FINDINGS Alignment: Normal. Skull base and vertebrae: Multilevel mild-to-moderate degenerative changes spine. No associated severe osseous neural foraminal or central canal stenosis. No acute fracture. No aggressive appearing focal osseous lesion or focal pathologic process. Soft tissues and spinal canal: No prevertebral fluid or swelling. No  visible canal hematoma. Upper chest: Partially visualized left upper lobe ground-glass airspace opacity (5: 32-42) Other: None. IMPRESSION: 1. No acute intracranial abnormality. 2. No acute displaced fracture or traumatic listhesis of the cervical spine. 3. Partially visualized left upper lobe ground-glass airspace opacity. Recommend correlation with chest x-ray. Electronically Signed   By: Iven Finn M.D.   On: 11/08/2021 22:06   CT Cervical Spine Wo Contrast  Result Date: 11/08/2021 CLINICAL DATA:  Head trauma, moderate-severe; Neck trauma (Age >= 65y). Fall about 6pm tonight. Hit head, denies loc. Denies n/v C/o pain in left hip. Full movement noted. EXAM: CT HEAD WITHOUT CONTRAST CT CERVICAL SPINE WITHOUT CONTRAST TECHNIQUE: Multidetector CT imaging of the head and cervical spine was performed following the standard protocol without intravenous contrast. Multiplanar CT image reconstructions of the cervical spine  were also generated. RADIATION DOSE REDUCTION: This exam was performed according to the departmental dose-optimization program which includes automated exposure control, adjustment of the mA and/or kV according to patient size and/or use of iterative reconstruction technique. COMPARISON:  CT head 08/22/2020 FINDINGS: CT HEAD FINDINGS BRAIN: BRAIN Cerebral ventricle sizes are concordant with the degree of cerebral volume loss. Patchy and confluent areas of decreased attenuation are noted throughout the deep and periventricular white matter of the cerebral hemispheres bilaterally, compatible with chronic microvascular ischemic disease. No evidence of large-territorial acute infarction. No parenchymal hemorrhage. No mass lesion. No extra-axial collection. No mass effect or midline shift. No hydrocephalus. Basilar cisterns are patent. Vascular: No hyperdense vessel. Atherosclerotic calcifications are present within the cavernous internal carotid arteries. Skull: No acute fracture or focal lesion. Sinuses/Orbits: Left maxillary mucosal thickening. Otherwise paranasal sinuses and mastoid air cells are clear. Right lens replacement. Otherwise the orbits are unremarkable. Other: Left parietooccipital scalp 5 mm hematoma. CT CERVICAL SPINE FINDINGS Alignment: Normal. Skull base and vertebrae: Multilevel mild-to-moderate degenerative changes spine. No associated severe osseous neural foraminal or central canal stenosis. No acute fracture. No aggressive appearing focal osseous lesion or focal pathologic process. Soft tissues and spinal canal: No prevertebral fluid or swelling. No visible canal hematoma. Upper chest: Partially visualized left upper lobe ground-glass airspace opacity (5: 32-42) Other: None. IMPRESSION: 1. No acute intracranial abnormality. 2. No acute displaced fracture or traumatic listhesis of the cervical spine. 3. Partially visualized left upper lobe ground-glass airspace opacity. Recommend correlation with  chest x-ray. Electronically Signed   By: Iven Finn M.D.   On: 11/08/2021 22:06   DG Hip Unilat With Pelvis 2-3 Views Left  Result Date: 11/08/2021 CLINICAL DATA:  Fall, hip pain EXAM: DG HIP (WITH OR WITHOUT PELVIS) 2-3V LEFT COMPARISON:  08/23/2020 FINDINGS: Prior left hip replacement. No hardware complicating feature. Old healed fractures in the right pubic bone. Irregularity in the left inferior pubic ramus concerning for acute fracture. No subluxation or dislocation. IMPRESSION: Prior left hip replacement. Concern for nondisplaced left inferior pubic ramus fracture Electronically Signed   By: Rolm Baptise M.D.   On: 11/08/2021 21:58    Procedures Procedures    Medications Ordered in ED Medications - No data to display  ED Course/ Medical Decision Making/ A&P                           Medical Decision Making Amount and/or Complexity of Data Reviewed Radiology: ordered.   Pt presents with mechanical fall onset 6 PM.  Patient hit the left side of her head and left hip.  Denies LOC, nausea, vomiting, neck pain,  back pain.  Vital signs, patient afebrile. On exam, pt with no spinal tenderness to palpation.  Tenderness to palpation noted to anterior and lateral aspect of left hip.  Full active range of motion of left hip without difficulty.  No focal neurological deficits.  Strength sensation intact to bilateral upper and lower extremities.  Grip strength 5/5 bilaterally.  Negative pronator drift.  No acute cardiovascular, respiratory exam findings. Differential diagnosis includes fracture, dislocation, intracranial abnormality,.    Imaging: I ordered imaging studies including chest x-ray, CT head, CT cervical spine, left hip I independently visualized and interpreted imaging which showed: Nondisplaced left inferior pubic ramus fracture.  No acute findings on CT head or cervical spine.  Chest x-ray ordered with results pending at time of signout. I agree with the radiologist  interpretation  Patient case discussed with Dr. Billy Fischer, at sign-out. Plan at sign-out is pending CXR and ambulation, likely Discharge home, however, plans may change as per oncoming team. Patient care transferred at sign out.    This chart was dictated using voice recognition software, Dragon. Despite the best efforts of this provider to proofread and correct errors, errors may still occur which can change documentation meaning.   Final Clinical Impression(s) / ED Diagnoses Final diagnoses:  Fall, initial encounter  Closed fracture of left inferior pubic ramus, initial encounter [facility])    Rx / DC Orders ED Discharge Orders     None         Huriel Matt A, PA-C 11/08/21 2232    Gareth Morgan, MD 11/09/21 2253

## 2021-11-08 NOTE — ED Notes (Signed)
MD at bedside. 

## 2021-11-10 DIAGNOSIS — S7002XD Contusion of left hip, subsequent encounter: Secondary | ICD-10-CM | POA: Diagnosis not present

## 2021-12-20 ENCOUNTER — Other Ambulatory Visit: Payer: Self-pay | Admitting: Adult Health

## 2021-12-29 ENCOUNTER — Other Ambulatory Visit (HOSPITAL_COMMUNITY): Payer: Self-pay | Admitting: *Deleted

## 2021-12-30 DIAGNOSIS — R69 Illness, unspecified: Secondary | ICD-10-CM | POA: Diagnosis not present

## 2021-12-30 DIAGNOSIS — H547 Unspecified visual loss: Secondary | ICD-10-CM | POA: Diagnosis not present

## 2021-12-30 DIAGNOSIS — Z9181 History of falling: Secondary | ICD-10-CM | POA: Diagnosis not present

## 2021-12-30 DIAGNOSIS — S32502D Unspecified fracture of left pubis, subsequent encounter for fracture with routine healing: Secondary | ICD-10-CM | POA: Diagnosis not present

## 2021-12-30 DIAGNOSIS — G20A1 Parkinson's disease without dyskinesia, without mention of fluctuations: Secondary | ICD-10-CM | POA: Diagnosis not present

## 2021-12-30 DIAGNOSIS — E785 Hyperlipidemia, unspecified: Secondary | ICD-10-CM | POA: Diagnosis not present

## 2021-12-30 DIAGNOSIS — M810 Age-related osteoporosis without current pathological fracture: Secondary | ICD-10-CM | POA: Diagnosis not present

## 2021-12-30 DIAGNOSIS — S72012D Unspecified intracapsular fracture of left femur, subsequent encounter for closed fracture with routine healing: Secondary | ICD-10-CM | POA: Diagnosis not present

## 2021-12-30 DIAGNOSIS — M1990 Unspecified osteoarthritis, unspecified site: Secondary | ICD-10-CM | POA: Diagnosis not present

## 2021-12-30 DIAGNOSIS — Z96642 Presence of left artificial hip joint: Secondary | ICD-10-CM | POA: Diagnosis not present

## 2021-12-30 DIAGNOSIS — I10 Essential (primary) hypertension: Secondary | ICD-10-CM | POA: Diagnosis not present

## 2021-12-30 DIAGNOSIS — S32592S Other specified fracture of left pubis, sequela: Secondary | ICD-10-CM | POA: Diagnosis not present

## 2021-12-31 ENCOUNTER — Encounter (HOSPITAL_COMMUNITY): Payer: Medicare HMO

## 2022-01-05 DIAGNOSIS — M1990 Unspecified osteoarthritis, unspecified site: Secondary | ICD-10-CM | POA: Diagnosis not present

## 2022-01-05 DIAGNOSIS — Z9181 History of falling: Secondary | ICD-10-CM | POA: Diagnosis not present

## 2022-01-05 DIAGNOSIS — Z96642 Presence of left artificial hip joint: Secondary | ICD-10-CM | POA: Diagnosis not present

## 2022-01-05 DIAGNOSIS — S32502D Unspecified fracture of left pubis, subsequent encounter for fracture with routine healing: Secondary | ICD-10-CM | POA: Diagnosis not present

## 2022-01-05 DIAGNOSIS — R69 Illness, unspecified: Secondary | ICD-10-CM | POA: Diagnosis not present

## 2022-01-05 DIAGNOSIS — E785 Hyperlipidemia, unspecified: Secondary | ICD-10-CM | POA: Diagnosis not present

## 2022-01-05 DIAGNOSIS — M810 Age-related osteoporosis without current pathological fracture: Secondary | ICD-10-CM | POA: Diagnosis not present

## 2022-01-05 DIAGNOSIS — I10 Essential (primary) hypertension: Secondary | ICD-10-CM | POA: Diagnosis not present

## 2022-01-05 DIAGNOSIS — S72012D Unspecified intracapsular fracture of left femur, subsequent encounter for closed fracture with routine healing: Secondary | ICD-10-CM | POA: Diagnosis not present

## 2022-01-05 DIAGNOSIS — G20A1 Parkinson's disease without dyskinesia, without mention of fluctuations: Secondary | ICD-10-CM | POA: Diagnosis not present

## 2022-01-05 DIAGNOSIS — H547 Unspecified visual loss: Secondary | ICD-10-CM | POA: Diagnosis not present

## 2022-01-11 DIAGNOSIS — I10 Essential (primary) hypertension: Secondary | ICD-10-CM | POA: Diagnosis not present

## 2022-01-11 DIAGNOSIS — R413 Other amnesia: Secondary | ICD-10-CM | POA: Diagnosis not present

## 2022-01-11 DIAGNOSIS — E785 Hyperlipidemia, unspecified: Secondary | ICD-10-CM | POA: Diagnosis not present

## 2022-01-11 DIAGNOSIS — R2689 Other abnormalities of gait and mobility: Secondary | ICD-10-CM | POA: Diagnosis not present

## 2022-01-11 DIAGNOSIS — Z0000 Encounter for general adult medical examination without abnormal findings: Secondary | ICD-10-CM | POA: Diagnosis not present

## 2022-01-11 DIAGNOSIS — M810 Age-related osteoporosis without current pathological fracture: Secondary | ICD-10-CM | POA: Diagnosis not present

## 2022-01-11 DIAGNOSIS — R69 Illness, unspecified: Secondary | ICD-10-CM | POA: Diagnosis not present

## 2022-01-11 DIAGNOSIS — D692 Other nonthrombocytopenic purpura: Secondary | ICD-10-CM | POA: Diagnosis not present

## 2022-01-11 DIAGNOSIS — Z9181 History of falling: Secondary | ICD-10-CM | POA: Diagnosis not present

## 2022-01-11 DIAGNOSIS — G20A1 Parkinson's disease without dyskinesia, without mention of fluctuations: Secondary | ICD-10-CM | POA: Diagnosis not present

## 2022-01-11 DIAGNOSIS — R443 Hallucinations, unspecified: Secondary | ICD-10-CM | POA: Diagnosis not present

## 2022-01-11 DIAGNOSIS — I700 Atherosclerosis of aorta: Secondary | ICD-10-CM | POA: Diagnosis not present

## 2022-01-12 ENCOUNTER — Ambulatory Visit (HOSPITAL_COMMUNITY)
Admission: RE | Admit: 2022-01-12 | Discharge: 2022-01-12 | Disposition: A | Discharge status: Home / Self Care | Payer: Medicare HMO | Source: Ambulatory Visit | Attending: Internal Medicine | Admitting: Internal Medicine

## 2022-01-12 DIAGNOSIS — M810 Age-related osteoporosis without current pathological fracture: Secondary | ICD-10-CM | POA: Insufficient documentation

## 2022-01-12 MED ORDER — DENOSUMAB 60 MG/ML ~~LOC~~ SOSY: 60.0000 mg | PREFILLED_SYRINGE | Freq: Once | SUBCUTANEOUS | Status: AC

## 2022-01-12 MED ORDER — DENOSUMAB 60 MG/ML ~~LOC~~ SOSY
PREFILLED_SYRINGE | SUBCUTANEOUS | Status: AC
  Administered 2022-01-12: 60 mg via SUBCUTANEOUS
  Filled 2022-01-12: qty 1

## 2022-01-13 DIAGNOSIS — S72012D Unspecified intracapsular fracture of left femur, subsequent encounter for closed fracture with routine healing: Secondary | ICD-10-CM | POA: Diagnosis not present

## 2022-01-13 DIAGNOSIS — M810 Age-related osteoporosis without current pathological fracture: Secondary | ICD-10-CM | POA: Diagnosis not present

## 2022-01-13 DIAGNOSIS — E785 Hyperlipidemia, unspecified: Secondary | ICD-10-CM | POA: Diagnosis not present

## 2022-01-13 DIAGNOSIS — R69 Illness, unspecified: Secondary | ICD-10-CM | POA: Diagnosis not present

## 2022-01-13 DIAGNOSIS — H547 Unspecified visual loss: Secondary | ICD-10-CM | POA: Diagnosis not present

## 2022-01-13 DIAGNOSIS — Z96642 Presence of left artificial hip joint: Secondary | ICD-10-CM | POA: Diagnosis not present

## 2022-01-13 DIAGNOSIS — M1990 Unspecified osteoarthritis, unspecified site: Secondary | ICD-10-CM | POA: Diagnosis not present

## 2022-01-13 DIAGNOSIS — G20A1 Parkinson's disease without dyskinesia, without mention of fluctuations: Secondary | ICD-10-CM | POA: Diagnosis not present

## 2022-01-13 DIAGNOSIS — I10 Essential (primary) hypertension: Secondary | ICD-10-CM | POA: Diagnosis not present

## 2022-01-13 DIAGNOSIS — S32502D Unspecified fracture of left pubis, subsequent encounter for fracture with routine healing: Secondary | ICD-10-CM | POA: Diagnosis not present

## 2022-01-13 DIAGNOSIS — Z9181 History of falling: Secondary | ICD-10-CM | POA: Diagnosis not present

## 2022-01-14 DIAGNOSIS — S32502D Unspecified fracture of left pubis, subsequent encounter for fracture with routine healing: Secondary | ICD-10-CM | POA: Diagnosis not present

## 2022-01-14 DIAGNOSIS — M1990 Unspecified osteoarthritis, unspecified site: Secondary | ICD-10-CM | POA: Diagnosis not present

## 2022-01-14 DIAGNOSIS — G20A1 Parkinson's disease without dyskinesia, without mention of fluctuations: Secondary | ICD-10-CM | POA: Diagnosis not present

## 2022-01-14 DIAGNOSIS — M810 Age-related osteoporosis without current pathological fracture: Secondary | ICD-10-CM | POA: Diagnosis not present

## 2022-01-14 DIAGNOSIS — I10 Essential (primary) hypertension: Secondary | ICD-10-CM | POA: Diagnosis not present

## 2022-01-14 DIAGNOSIS — Z96642 Presence of left artificial hip joint: Secondary | ICD-10-CM | POA: Diagnosis not present

## 2022-01-14 DIAGNOSIS — S72012D Unspecified intracapsular fracture of left femur, subsequent encounter for closed fracture with routine healing: Secondary | ICD-10-CM | POA: Diagnosis not present

## 2022-01-14 DIAGNOSIS — Z9181 History of falling: Secondary | ICD-10-CM | POA: Diagnosis not present

## 2022-01-14 DIAGNOSIS — R69 Illness, unspecified: Secondary | ICD-10-CM | POA: Diagnosis not present

## 2022-01-14 DIAGNOSIS — E785 Hyperlipidemia, unspecified: Secondary | ICD-10-CM | POA: Diagnosis not present

## 2022-01-14 DIAGNOSIS — H547 Unspecified visual loss: Secondary | ICD-10-CM | POA: Diagnosis not present

## 2022-01-15 DIAGNOSIS — I10 Essential (primary) hypertension: Secondary | ICD-10-CM | POA: Diagnosis not present

## 2022-01-15 DIAGNOSIS — S32502D Unspecified fracture of left pubis, subsequent encounter for fracture with routine healing: Secondary | ICD-10-CM | POA: Diagnosis not present

## 2022-01-15 DIAGNOSIS — R69 Illness, unspecified: Secondary | ICD-10-CM | POA: Diagnosis not present

## 2022-01-15 DIAGNOSIS — S72012D Unspecified intracapsular fracture of left femur, subsequent encounter for closed fracture with routine healing: Secondary | ICD-10-CM | POA: Diagnosis not present

## 2022-01-15 DIAGNOSIS — Z9181 History of falling: Secondary | ICD-10-CM | POA: Diagnosis not present

## 2022-01-15 DIAGNOSIS — E785 Hyperlipidemia, unspecified: Secondary | ICD-10-CM | POA: Diagnosis not present

## 2022-01-15 DIAGNOSIS — M1990 Unspecified osteoarthritis, unspecified site: Secondary | ICD-10-CM | POA: Diagnosis not present

## 2022-01-15 DIAGNOSIS — M810 Age-related osteoporosis without current pathological fracture: Secondary | ICD-10-CM | POA: Diagnosis not present

## 2022-01-15 DIAGNOSIS — H547 Unspecified visual loss: Secondary | ICD-10-CM | POA: Diagnosis not present

## 2022-01-15 DIAGNOSIS — G20A1 Parkinson's disease without dyskinesia, without mention of fluctuations: Secondary | ICD-10-CM | POA: Diagnosis not present

## 2022-01-15 DIAGNOSIS — Z96642 Presence of left artificial hip joint: Secondary | ICD-10-CM | POA: Diagnosis not present

## 2022-01-18 DIAGNOSIS — G20A1 Parkinson's disease without dyskinesia, without mention of fluctuations: Secondary | ICD-10-CM | POA: Diagnosis not present

## 2022-01-18 DIAGNOSIS — H547 Unspecified visual loss: Secondary | ICD-10-CM | POA: Diagnosis not present

## 2022-01-18 DIAGNOSIS — M1990 Unspecified osteoarthritis, unspecified site: Secondary | ICD-10-CM | POA: Diagnosis not present

## 2022-01-18 DIAGNOSIS — S32502D Unspecified fracture of left pubis, subsequent encounter for fracture with routine healing: Secondary | ICD-10-CM | POA: Diagnosis not present

## 2022-01-18 DIAGNOSIS — M810 Age-related osteoporosis without current pathological fracture: Secondary | ICD-10-CM | POA: Diagnosis not present

## 2022-01-18 DIAGNOSIS — S72012D Unspecified intracapsular fracture of left femur, subsequent encounter for closed fracture with routine healing: Secondary | ICD-10-CM | POA: Diagnosis not present

## 2022-01-18 DIAGNOSIS — I10 Essential (primary) hypertension: Secondary | ICD-10-CM | POA: Diagnosis not present

## 2022-01-18 DIAGNOSIS — Z9181 History of falling: Secondary | ICD-10-CM | POA: Diagnosis not present

## 2022-01-18 DIAGNOSIS — E785 Hyperlipidemia, unspecified: Secondary | ICD-10-CM | POA: Diagnosis not present

## 2022-01-18 DIAGNOSIS — Z96642 Presence of left artificial hip joint: Secondary | ICD-10-CM | POA: Diagnosis not present

## 2022-01-18 DIAGNOSIS — R69 Illness, unspecified: Secondary | ICD-10-CM | POA: Diagnosis not present

## 2022-01-19 DIAGNOSIS — I10 Essential (primary) hypertension: Secondary | ICD-10-CM | POA: Diagnosis not present

## 2022-01-19 DIAGNOSIS — G20A1 Parkinson's disease without dyskinesia, without mention of fluctuations: Secondary | ICD-10-CM | POA: Diagnosis not present

## 2022-01-19 DIAGNOSIS — E785 Hyperlipidemia, unspecified: Secondary | ICD-10-CM | POA: Diagnosis not present

## 2022-01-19 DIAGNOSIS — S32502D Unspecified fracture of left pubis, subsequent encounter for fracture with routine healing: Secondary | ICD-10-CM | POA: Diagnosis not present

## 2022-01-19 DIAGNOSIS — Z9181 History of falling: Secondary | ICD-10-CM | POA: Diagnosis not present

## 2022-01-19 DIAGNOSIS — H547 Unspecified visual loss: Secondary | ICD-10-CM | POA: Diagnosis not present

## 2022-01-19 DIAGNOSIS — R69 Illness, unspecified: Secondary | ICD-10-CM | POA: Diagnosis not present

## 2022-01-19 DIAGNOSIS — S72012D Unspecified intracapsular fracture of left femur, subsequent encounter for closed fracture with routine healing: Secondary | ICD-10-CM | POA: Diagnosis not present

## 2022-01-19 DIAGNOSIS — Z96642 Presence of left artificial hip joint: Secondary | ICD-10-CM | POA: Diagnosis not present

## 2022-01-19 DIAGNOSIS — M810 Age-related osteoporosis without current pathological fracture: Secondary | ICD-10-CM | POA: Diagnosis not present

## 2022-01-19 DIAGNOSIS — M1990 Unspecified osteoarthritis, unspecified site: Secondary | ICD-10-CM | POA: Diagnosis not present

## 2022-01-20 ENCOUNTER — Telehealth: Payer: Self-pay | Admitting: Adult Health

## 2022-01-20 NOTE — Telephone Encounter (Addendum)
There should still be another refill or two on file at this location. I called [facility] and confirmed. They will fill this for her now. She still has more refills on file.

## 2022-01-20 NOTE — Telephone Encounter (Signed)
Pt is calling. Requesting refill on medication QUEtiapine (SEROQUEL) 25 MG tablet. Refill should be sent to [facility] #[id number].

## 2022-01-20 NOTE — Telephone Encounter (Signed)
Pt called back. I read her message from nurse [place]. She said thank you so much.

## 2022-01-21 DIAGNOSIS — S72012D Unspecified intracapsular fracture of left femur, subsequent encounter for closed fracture with routine healing: Secondary | ICD-10-CM | POA: Diagnosis not present

## 2022-01-21 DIAGNOSIS — M810 Age-related osteoporosis without current pathological fracture: Secondary | ICD-10-CM | POA: Diagnosis not present

## 2022-01-21 DIAGNOSIS — I10 Essential (primary) hypertension: Secondary | ICD-10-CM | POA: Diagnosis not present

## 2022-01-21 DIAGNOSIS — R69 Illness, unspecified: Secondary | ICD-10-CM | POA: Diagnosis not present

## 2022-01-21 DIAGNOSIS — S32502D Unspecified fracture of left pubis, subsequent encounter for fracture with routine healing: Secondary | ICD-10-CM | POA: Diagnosis not present

## 2022-01-21 DIAGNOSIS — M1990 Unspecified osteoarthritis, unspecified site: Secondary | ICD-10-CM | POA: Diagnosis not present

## 2022-01-21 DIAGNOSIS — Z9181 History of falling: Secondary | ICD-10-CM | POA: Diagnosis not present

## 2022-01-21 DIAGNOSIS — G20A1 Parkinson's disease without dyskinesia, without mention of fluctuations: Secondary | ICD-10-CM | POA: Diagnosis not present

## 2022-01-21 DIAGNOSIS — H547 Unspecified visual loss: Secondary | ICD-10-CM | POA: Diagnosis not present

## 2022-01-21 DIAGNOSIS — Z96642 Presence of left artificial hip joint: Secondary | ICD-10-CM | POA: Diagnosis not present

## 2022-01-21 DIAGNOSIS — E785 Hyperlipidemia, unspecified: Secondary | ICD-10-CM | POA: Diagnosis not present

## 2022-01-25 DIAGNOSIS — G20A1 Parkinson's disease without dyskinesia, without mention of fluctuations: Secondary | ICD-10-CM | POA: Diagnosis not present

## 2022-01-25 DIAGNOSIS — S72012D Unspecified intracapsular fracture of left femur, subsequent encounter for closed fracture with routine healing: Secondary | ICD-10-CM | POA: Diagnosis not present

## 2022-01-25 DIAGNOSIS — I10 Essential (primary) hypertension: Secondary | ICD-10-CM | POA: Diagnosis not present

## 2022-01-25 DIAGNOSIS — Z9181 History of falling: Secondary | ICD-10-CM | POA: Diagnosis not present

## 2022-01-25 DIAGNOSIS — R69 Illness, unspecified: Secondary | ICD-10-CM | POA: Diagnosis not present

## 2022-01-25 DIAGNOSIS — H547 Unspecified visual loss: Secondary | ICD-10-CM | POA: Diagnosis not present

## 2022-01-25 DIAGNOSIS — M810 Age-related osteoporosis without current pathological fracture: Secondary | ICD-10-CM | POA: Diagnosis not present

## 2022-01-25 DIAGNOSIS — Z96642 Presence of left artificial hip joint: Secondary | ICD-10-CM | POA: Diagnosis not present

## 2022-01-25 DIAGNOSIS — M1990 Unspecified osteoarthritis, unspecified site: Secondary | ICD-10-CM | POA: Diagnosis not present

## 2022-01-25 DIAGNOSIS — S32502D Unspecified fracture of left pubis, subsequent encounter for fracture with routine healing: Secondary | ICD-10-CM | POA: Diagnosis not present

## 2022-01-25 DIAGNOSIS — E785 Hyperlipidemia, unspecified: Secondary | ICD-10-CM | POA: Diagnosis not present

## 2022-01-26 DIAGNOSIS — S72012D Unspecified intracapsular fracture of left femur, subsequent encounter for closed fracture with routine healing: Secondary | ICD-10-CM | POA: Diagnosis not present

## 2022-01-26 DIAGNOSIS — M1990 Unspecified osteoarthritis, unspecified site: Secondary | ICD-10-CM | POA: Diagnosis not present

## 2022-01-26 DIAGNOSIS — I10 Essential (primary) hypertension: Secondary | ICD-10-CM | POA: Diagnosis not present

## 2022-01-26 DIAGNOSIS — Z9181 History of falling: Secondary | ICD-10-CM | POA: Diagnosis not present

## 2022-01-26 DIAGNOSIS — G20A1 Parkinson's disease without dyskinesia, without mention of fluctuations: Secondary | ICD-10-CM | POA: Diagnosis not present

## 2022-01-26 DIAGNOSIS — H547 Unspecified visual loss: Secondary | ICD-10-CM | POA: Diagnosis not present

## 2022-01-26 DIAGNOSIS — S32502D Unspecified fracture of left pubis, subsequent encounter for fracture with routine healing: Secondary | ICD-10-CM | POA: Diagnosis not present

## 2022-01-26 DIAGNOSIS — M810 Age-related osteoporosis without current pathological fracture: Secondary | ICD-10-CM | POA: Diagnosis not present

## 2022-01-26 DIAGNOSIS — R69 Illness, unspecified: Secondary | ICD-10-CM | POA: Diagnosis not present

## 2022-01-26 DIAGNOSIS — Z96642 Presence of left artificial hip joint: Secondary | ICD-10-CM | POA: Diagnosis not present

## 2022-01-26 DIAGNOSIS — E785 Hyperlipidemia, unspecified: Secondary | ICD-10-CM | POA: Diagnosis not present

## 2022-01-28 DIAGNOSIS — R69 Illness, unspecified: Secondary | ICD-10-CM | POA: Diagnosis not present

## 2022-01-28 DIAGNOSIS — S72012D Unspecified intracapsular fracture of left femur, subsequent encounter for closed fracture with routine healing: Secondary | ICD-10-CM | POA: Diagnosis not present

## 2022-01-28 DIAGNOSIS — M810 Age-related osteoporosis without current pathological fracture: Secondary | ICD-10-CM | POA: Diagnosis not present

## 2022-01-28 DIAGNOSIS — G20A1 Parkinson's disease without dyskinesia, without mention of fluctuations: Secondary | ICD-10-CM | POA: Diagnosis not present

## 2022-01-28 DIAGNOSIS — H547 Unspecified visual loss: Secondary | ICD-10-CM | POA: Diagnosis not present

## 2022-01-28 DIAGNOSIS — S32502D Unspecified fracture of left pubis, subsequent encounter for fracture with routine healing: Secondary | ICD-10-CM | POA: Diagnosis not present

## 2022-01-28 DIAGNOSIS — M1990 Unspecified osteoarthritis, unspecified site: Secondary | ICD-10-CM | POA: Diagnosis not present

## 2022-01-28 DIAGNOSIS — Z96642 Presence of left artificial hip joint: Secondary | ICD-10-CM | POA: Diagnosis not present

## 2022-01-28 DIAGNOSIS — Z9181 History of falling: Secondary | ICD-10-CM | POA: Diagnosis not present

## 2022-01-28 DIAGNOSIS — E785 Hyperlipidemia, unspecified: Secondary | ICD-10-CM | POA: Diagnosis not present

## 2022-01-28 DIAGNOSIS — I10 Essential (primary) hypertension: Secondary | ICD-10-CM | POA: Diagnosis not present

## 2022-02-02 DIAGNOSIS — S32502D Unspecified fracture of left pubis, subsequent encounter for fracture with routine healing: Secondary | ICD-10-CM | POA: Diagnosis not present

## 2022-02-02 DIAGNOSIS — M810 Age-related osteoporosis without current pathological fracture: Secondary | ICD-10-CM | POA: Diagnosis not present

## 2022-02-02 DIAGNOSIS — H547 Unspecified visual loss: Secondary | ICD-10-CM | POA: Diagnosis not present

## 2022-02-02 DIAGNOSIS — E785 Hyperlipidemia, unspecified: Secondary | ICD-10-CM | POA: Diagnosis not present

## 2022-02-02 DIAGNOSIS — I10 Essential (primary) hypertension: Secondary | ICD-10-CM | POA: Diagnosis not present

## 2022-02-02 DIAGNOSIS — M1990 Unspecified osteoarthritis, unspecified site: Secondary | ICD-10-CM | POA: Diagnosis not present

## 2022-02-02 DIAGNOSIS — G20A1 Parkinson's disease without dyskinesia, without mention of fluctuations: Secondary | ICD-10-CM | POA: Diagnosis not present

## 2022-02-02 DIAGNOSIS — R69 Illness, unspecified: Secondary | ICD-10-CM | POA: Diagnosis not present

## 2022-02-02 DIAGNOSIS — Z96642 Presence of left artificial hip joint: Secondary | ICD-10-CM | POA: Diagnosis not present

## 2022-02-02 DIAGNOSIS — S72012D Unspecified intracapsular fracture of left femur, subsequent encounter for closed fracture with routine healing: Secondary | ICD-10-CM | POA: Diagnosis not present

## 2022-02-02 DIAGNOSIS — Z9181 History of falling: Secondary | ICD-10-CM | POA: Diagnosis not present

## 2022-02-11 DIAGNOSIS — S32502D Unspecified fracture of left pubis, subsequent encounter for fracture with routine healing: Secondary | ICD-10-CM | POA: Diagnosis not present

## 2022-02-11 DIAGNOSIS — I10 Essential (primary) hypertension: Secondary | ICD-10-CM | POA: Diagnosis not present

## 2022-02-11 DIAGNOSIS — E785 Hyperlipidemia, unspecified: Secondary | ICD-10-CM | POA: Diagnosis not present

## 2022-02-11 DIAGNOSIS — G20A1 Parkinson's disease without dyskinesia, without mention of fluctuations: Secondary | ICD-10-CM | POA: Diagnosis not present

## 2022-02-11 DIAGNOSIS — S72012D Unspecified intracapsular fracture of left femur, subsequent encounter for closed fracture with routine healing: Secondary | ICD-10-CM | POA: Diagnosis not present

## 2022-02-11 DIAGNOSIS — Z9181 History of falling: Secondary | ICD-10-CM | POA: Diagnosis not present

## 2022-02-11 DIAGNOSIS — R69 Illness, unspecified: Secondary | ICD-10-CM | POA: Diagnosis not present

## 2022-02-11 DIAGNOSIS — H547 Unspecified visual loss: Secondary | ICD-10-CM | POA: Diagnosis not present

## 2022-02-11 DIAGNOSIS — Z96642 Presence of left artificial hip joint: Secondary | ICD-10-CM | POA: Diagnosis not present

## 2022-02-11 DIAGNOSIS — M1990 Unspecified osteoarthritis, unspecified site: Secondary | ICD-10-CM | POA: Diagnosis not present

## 2022-02-11 DIAGNOSIS — M810 Age-related osteoporosis without current pathological fracture: Secondary | ICD-10-CM | POA: Diagnosis not present

## 2022-02-16 DIAGNOSIS — E785 Hyperlipidemia, unspecified: Secondary | ICD-10-CM | POA: Diagnosis not present

## 2022-02-16 DIAGNOSIS — M1990 Unspecified osteoarthritis, unspecified site: Secondary | ICD-10-CM | POA: Diagnosis not present

## 2022-02-16 DIAGNOSIS — H547 Unspecified visual loss: Secondary | ICD-10-CM | POA: Diagnosis not present

## 2022-02-16 DIAGNOSIS — Z9181 History of falling: Secondary | ICD-10-CM | POA: Diagnosis not present

## 2022-02-16 DIAGNOSIS — M810 Age-related osteoporosis without current pathological fracture: Secondary | ICD-10-CM | POA: Diagnosis not present

## 2022-02-16 DIAGNOSIS — G20A1 Parkinson's disease without dyskinesia, without mention of fluctuations: Secondary | ICD-10-CM | POA: Diagnosis not present

## 2022-02-16 DIAGNOSIS — R69 Illness, unspecified: Secondary | ICD-10-CM | POA: Diagnosis not present

## 2022-02-16 DIAGNOSIS — I10 Essential (primary) hypertension: Secondary | ICD-10-CM | POA: Diagnosis not present

## 2022-02-16 DIAGNOSIS — S72012D Unspecified intracapsular fracture of left femur, subsequent encounter for closed fracture with routine healing: Secondary | ICD-10-CM | POA: Diagnosis not present

## 2022-02-16 DIAGNOSIS — Z96642 Presence of left artificial hip joint: Secondary | ICD-10-CM | POA: Diagnosis not present

## 2022-02-16 DIAGNOSIS — S32502D Unspecified fracture of left pubis, subsequent encounter for fracture with routine healing: Secondary | ICD-10-CM | POA: Diagnosis not present

## 2022-03-10 DIAGNOSIS — M810 Age-related osteoporosis without current pathological fracture: Secondary | ICD-10-CM | POA: Diagnosis not present

## 2022-03-10 DIAGNOSIS — R69 Illness, unspecified: Secondary | ICD-10-CM | POA: Diagnosis not present

## 2022-03-10 DIAGNOSIS — H5462 Unqualified visual loss, left eye, normal vision right eye: Secondary | ICD-10-CM | POA: Diagnosis not present

## 2022-03-10 DIAGNOSIS — M1990 Unspecified osteoarthritis, unspecified site: Secondary | ICD-10-CM | POA: Diagnosis not present

## 2022-03-10 DIAGNOSIS — Z8249 Family history of ischemic heart disease and other diseases of the circulatory system: Secondary | ICD-10-CM | POA: Diagnosis not present

## 2022-03-10 DIAGNOSIS — Z809 Family history of malignant neoplasm, unspecified: Secondary | ICD-10-CM | POA: Diagnosis not present

## 2022-03-10 DIAGNOSIS — R270 Ataxia, unspecified: Secondary | ICD-10-CM | POA: Diagnosis not present

## 2022-03-10 DIAGNOSIS — Z823 Family history of stroke: Secondary | ICD-10-CM | POA: Diagnosis not present

## 2022-03-10 DIAGNOSIS — F429 Obsessive-compulsive disorder, unspecified: Secondary | ICD-10-CM | POA: Diagnosis not present

## 2022-03-10 DIAGNOSIS — F419 Anxiety disorder, unspecified: Secondary | ICD-10-CM | POA: Diagnosis not present

## 2022-03-10 DIAGNOSIS — R32 Unspecified urinary incontinence: Secondary | ICD-10-CM | POA: Diagnosis not present

## 2022-03-10 DIAGNOSIS — H269 Unspecified cataract: Secondary | ICD-10-CM | POA: Diagnosis not present

## 2022-03-10 DIAGNOSIS — I10 Essential (primary) hypertension: Secondary | ICD-10-CM | POA: Diagnosis not present

## 2022-04-13 ENCOUNTER — Telehealth: Payer: Self-pay | Admitting: Adult Health

## 2022-04-13 MED ORDER — QUETIAPINE FUMARATE 25 MG PO TABS: ORAL_TABLET | ORAL | 2 refills | Status: DC

## 2022-04-13 NOTE — Telephone Encounter (Signed)
Pt called stated she needs a refill on    QUEtiapine (SEROQUEL) 25 MG tablet. Refill should be sent sent to [facility] [phone]

## 2022-04-13 NOTE — Telephone Encounter (Signed)
Refills sent

## 2022-04-14 IMAGING — CT CT HEAD W/O CM
4 series · 17 of 47 positions shown, 19 images · non-contrast
Comparison: 08/24/2018

CLINICAL DATA: Fall yesterday, hit head, worsening Parkinson's
symptoms

EXAM:
CT HEAD WITHOUT CONTRAST
TECHNIQUE: Contiguous axial images were obtained from the base of the skull
through the vertex without intravenous contrast.

[Series 2: head bone · axial · 0.41mm/px · z∈[-131,-81]mm · 4 of 74 slices shown]
[im 8/74  bone]
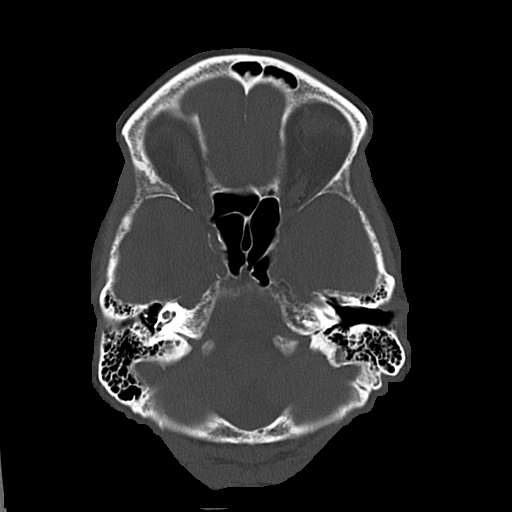
[im 15/74  bone]
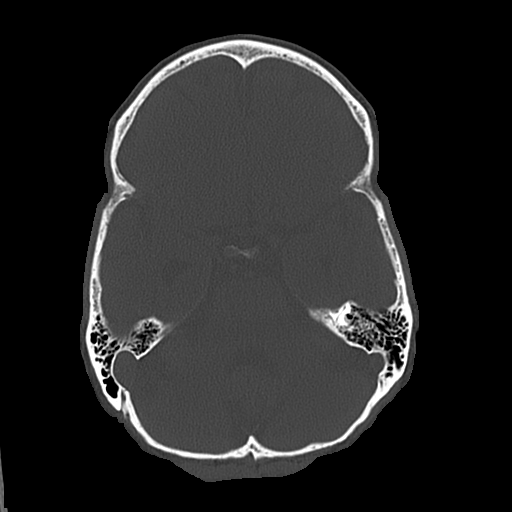
[im 22/74  bone]
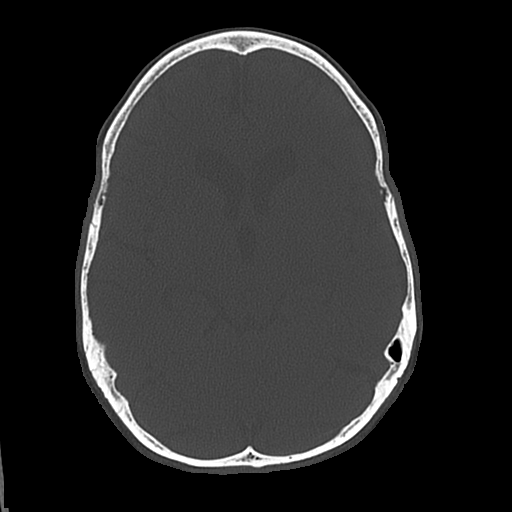
[im 33/74  bone]
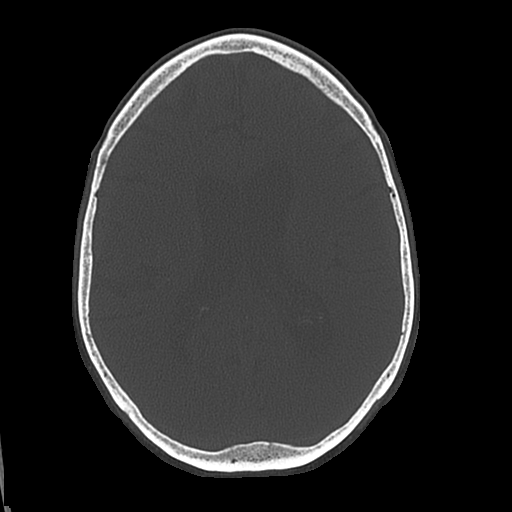

[Series 3: head wo · axial · 0.41mm/px · z∈[-129,-19]mm · 7 of 30 slices shown, 9 images]
[im 4/30  brain]
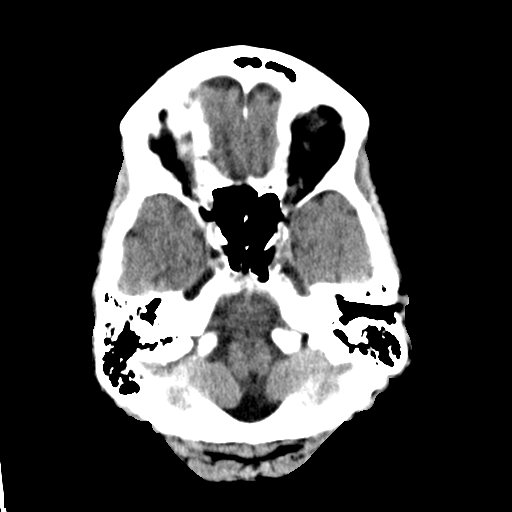
[im 4/30  bone]
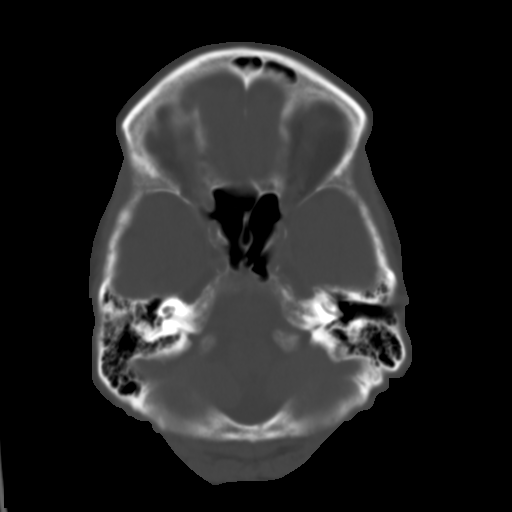
[im 8/30  brain]
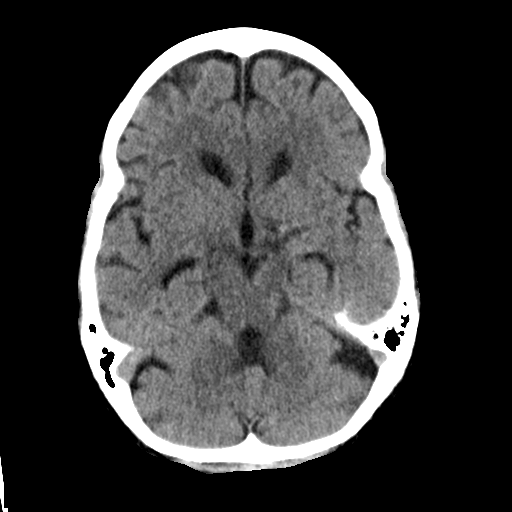
[im 11/30  brain]
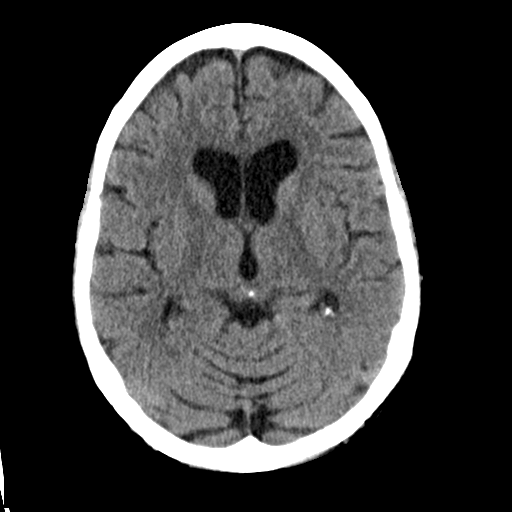
[im 15/30  brain]
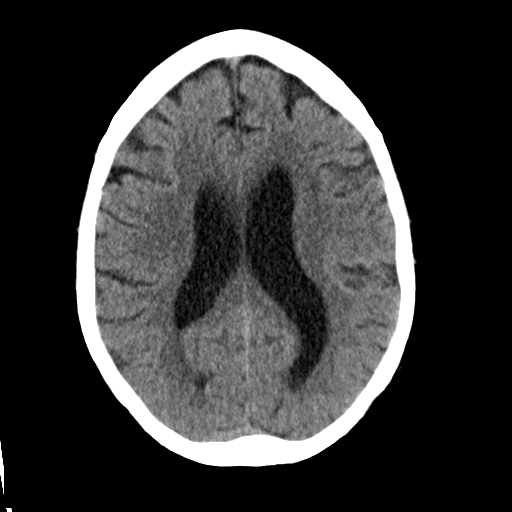
[im 19/30  brain]
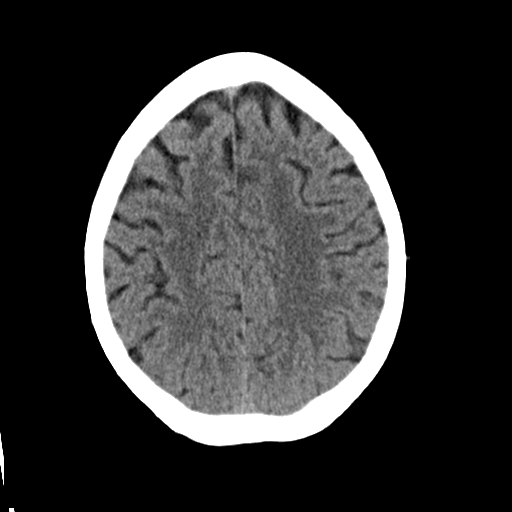
[im 19/30  bone]
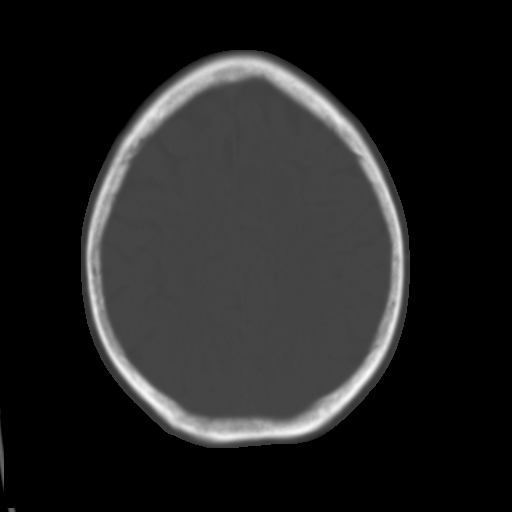
[im 22/30  brain]
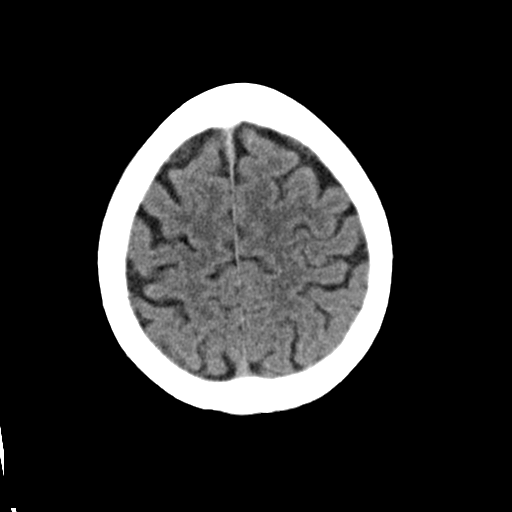
[im 26/30  brain]
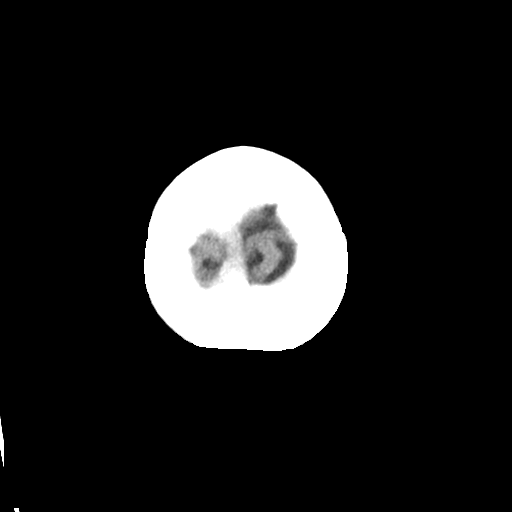

[Series 4: coronal soft · coronal · 0.28mm/px · 3 of 66 slices shown]
[im 22/66  brain]
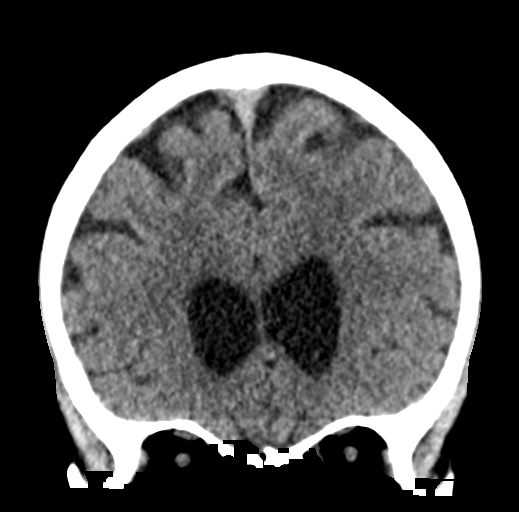
[im 29/66  brain]
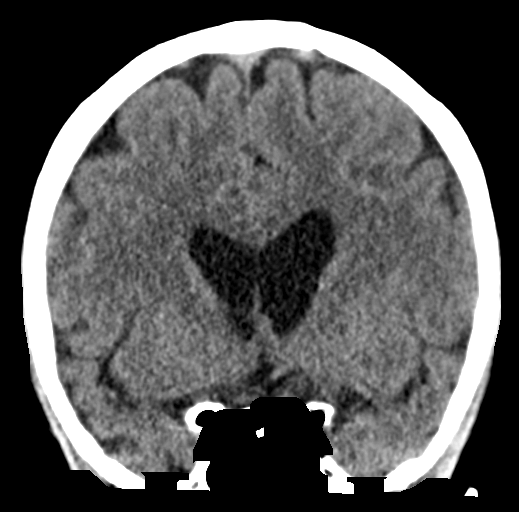
[im 37/66  brain]
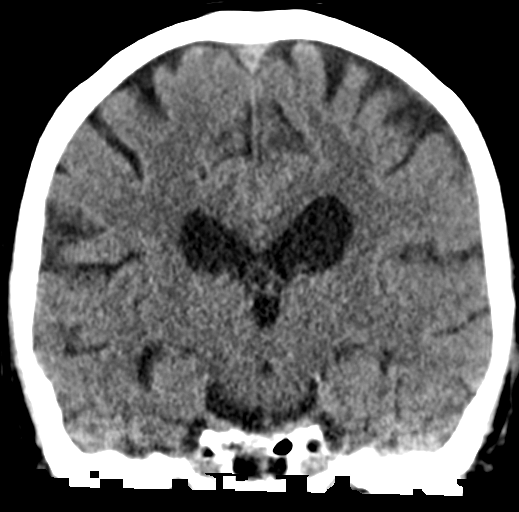

[Series 5: sagittal soft · sagittal · 0.28mm/px · 3 of 50 slices shown]
[im 17/50  brain]
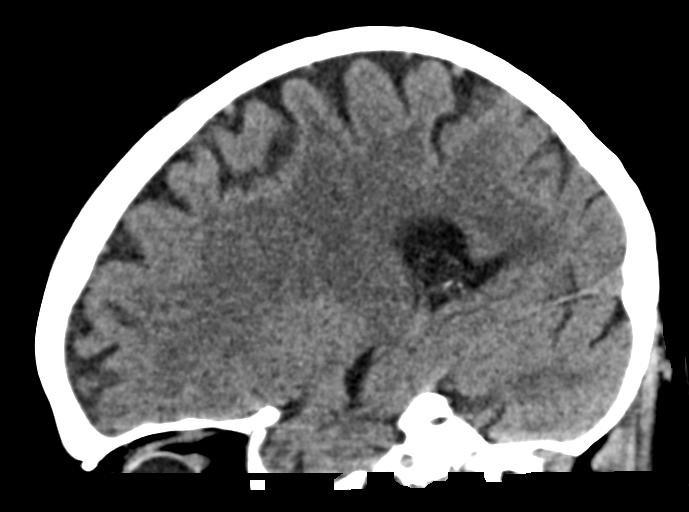
[im 25/50  brain]
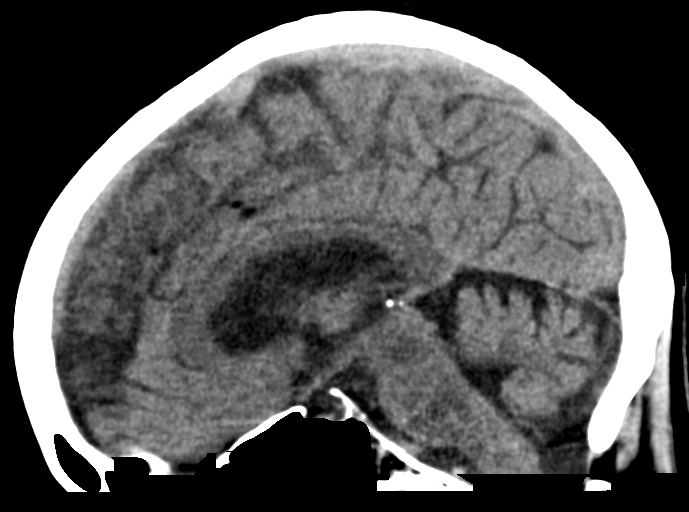
[im 33/50  brain]
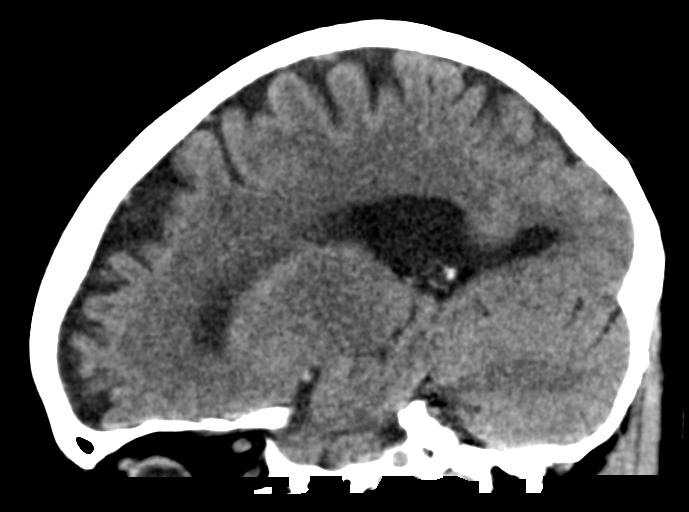

[17 of 47 positions shown; findings below may reference images not displayed]

FINDINGS: Brain: No evidence of acute infarction, hemorrhage, hydrocephalus,
extra-axial collection or mass lesion/mass effect. Mild
periventricular white matter hypodensity.

Vascular: No hyperdense vessel or unexpected calcification.

Skull: Normal. Negative for fracture or focal lesion.

Sinuses/Orbits: No acute finding.

Other: None.
IMPRESSION: No acute intracranial pathology. Small-vessel white matter disease.

## 2022-04-26 ENCOUNTER — Ambulatory Visit: Payer: Medicare HMO | Admitting: Diagnostic Neuroimaging

## 2022-04-26 ENCOUNTER — Encounter: Payer: Self-pay | Admitting: Diagnostic Neuroimaging

## 2022-04-26 VITALS — Ht 60.0 in | Wt 103.0 lb

## 2022-04-26 DIAGNOSIS — R413 Other amnesia: Secondary | ICD-10-CM | POA: Diagnosis not present

## 2022-04-26 DIAGNOSIS — G20C Parkinsonism, unspecified: Secondary | ICD-10-CM | POA: Diagnosis not present

## 2022-04-26 MED ORDER — QUETIAPINE FUMARATE 25 MG PO TABS: 37.5000 mg | ORAL_TABLET | Freq: Every day | ORAL | 4 refills | Status: DC

## 2022-04-26 MED ORDER — CARBIDOPA-LEVODOPA 25-100 MG PO TABS: 1.5000 | ORAL_TABLET | Freq: Three times a day (TID) | ORAL | 4 refills | Status: DC

## 2022-04-26 NOTE — Patient Instructions (Addendum)
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

## 2022-04-26 NOTE — Progress Notes (Signed)
[facility]  PATIENT: Patricia Fitzpatrick DOB: [birth year]  REFERRING CLINICIAN: Shon Baton, MD HISTORY FROM: patient REASON FOR VISIT: follow up   HISTORICAL  CHIEF COMPLAINT:  Chief Complaint  Patient presents with   Follow-up    Rm 7, pt with step daughter. She feels like parkinson's is progressing. Having increase in falls. She shuffles feet with sitting and laying down (without being aware). Increase in tremors. Notices that she can get tired and easily out of breath. There is also concerns with memory. She can see step daughter and she may forget conversation she has with her.     HISTORY OF PRESENT ILLNESS:    UPDATE (04/26/22, VRP): Since last visit, doing about he same. Symptoms are stable. No alleviating or aggravating factors. Tolerating meds.  Some hallucinations INTERMITTENTLY. No wearing off of meds. Some mild memory loss.   PRIOR 10/22/21 MM  Denies tremors. Reports that she may veer to the left with ambulating. Had a fall about 2 weeks ago. No injuires that she is aware is aware of. She leaves in her own residence. Her stepdaughter and bother live with her. Able to complete all ADLs independently. Manages her own appointments, medications and finances. Still has hallucinations. Not has any in the last 2-3 nights. Reports that she is seeing her grandson at 70-68 years old and have converstations. She recognizes that its not real. Taking seroquel 25 mg 1.5 tablets at bedtime.    PRIOR 04/21/21 MM  Patricia Fitzpatrick is a 77 year old female with a history of Parkinson's disease.  She returns today for follow-up.  Overall the patient feels that she has remained stable.  Her last fall was July 2022 and she broke her hip.  She states that she has recovered well.  Has not had any falls since then.  She does use a cane when she is out of her house.  Denies any trouble with her swallowing.  Reports good appetite.  Denies any trouble sleeping.  Denies a tremor.  She feels that her  memory has remained stable.  She currently lives in her own home with a stepdaughter and brother.  Patient reports concerns about her stepdaughter not helping financially which she feels that a lot of stress to her.  Her hallucinations have been relatively stable.  She states that she still has them but recognizes that they are not real.  She continues on Seroquel 25 mg at bedtime.  Continues on Sinemet 25-100 mg 1-1/2 tablets 3 times a day and Mirapex 0.5 mg 3 times a day   HISTORY Patricia Fitzpatrick is a 77 year old right-handed white female with a history of Parkinson's disease.  The patient does have a mild memory disturbance.  She lives at home with her brother and her sister-in-law.  The patient is able to drive a car but she does not drive much.  She likes to cook, she keeps track of her own medications and appointments.  She does miss her Sinemet dose at times.  She has had some increasing problems with gait instability.  She fell on 28 April 2020 and went to the emergency room.  She went to the ER on 18 July 2020 with increased tremors of the left leg.  She had been on Wellbutrin around that time and she realized that this was making her nervous and jittery and she has stopped the medication.  The patient however continues to fall, she has had 4 or 5 falls over the last 2 months.  She  may have a tendency to lean backwards a bit.  When she is walking, sometimes she will veer to the right.  She has had a recent CT scan of the brain.  She does have hallucinations off and on but this is not frequent, this occurs about once a week.  She uses Seroquel at night.  She returns to the office today for further evaluation.  She is on Mirapex taking 0.5 mg 3 times daily and she is on Sinemet 1.5 tablets of the 25/100 mg Sinemet, 3 times daily.    REVIEW OF SYSTEMS: Full 14 system review of systems performed and negative with exception of: as per HPI.  ALLERGIES: No Known Allergies  HOME MEDICATIONS: Outpatient  Medications Prior to Visit  Medication Sig Dispense Refill   acetaminophen (TYLENOL) 500 MG tablet Take 1 tablet (500 mg total) by mouth every 6 (six) hours as needed for moderate pain. Hold while on norco (Patient taking differently: Take 500 mg by mouth as needed for mild pain or moderate pain.) 30 tablet 0   B Complex Vitamins (B COMPLEX PO) Take 1 tablet by mouth daily.     Cholecalciferol (VITAMIN D3 PO) Take 50 mcg by mouth daily.     denosumab (PROLIA) 60 MG/ML SOSY injection every 6 (six) months.     FLUoxetine (PROZAC) 40 MG capsule Take 40 mg by mouth 2 (two) times daily.     vitamin C (ASCORBIC ACID) 500 MG tablet Take 500 mg by mouth daily.     carbidopa-levodopa (SINEMET IR) 25-100 MG tablet TAKE 1 AND 1/2 TABLETS BY MOUTH THREE TIMES DAILY 405 tablet 3   pramipexole (MIRAPEX) 0.5 MG tablet TAKE 1 TABLET(0.5 MG) BY MOUTH THREE TIMES DAILY 270 tablet 3   QUEtiapine (SEROQUEL) 25 MG tablet TAKE 1 AND 1/2 TABLETS BY MOUTH AT BEDTIME 45 tablet 2   alendronate (FOSAMAX) 70 MG tablet Take 70 mg by mouth once a week. With 8 ounces of water needs to remain upright for 30 minutes and no food or drink for 30 minutes  2   enoxaparin (LOVENOX) 40 MG/0.4ML injection Inject 0.4 mLs (40 mg total) into the skin daily. (Patient not taking: Reported on 04/21/2021) 12 mL 0   HYDROcodone-acetaminophen (NORCO) 5-325 MG tablet Take 1 tablet by mouth every 6 (six) hours as needed for severe pain. (Patient not taking: Reported on 10/22/2021) 20 tablet 0   No facility-administered medications prior to visit.    PAST MEDICAL HISTORY: Past Medical History:  Diagnosis Date   Anxiety    Cataracts, bilateral    Depression    High cholesterol    Hypertension    OCD (obsessive compulsive disorder)    Parkinsonism 10/14/2015   Tremor     PAST SURGICAL HISTORY: Past Surgical History:  Procedure Laterality Date   BREAST BIOPSY  1981   CATARACT EXTRACTION Right 2017   EYE SURGERY  1952   HIP ARTHROPLASTY  Left 08/23/2020   Procedure: ARTHROPLASTY BIPOLAR HIP (HEMIARTHROPLASTY);  Surgeon: Hiram Gash, MD;  Location: [facility];  Service: Orthopedics;  Laterality: Left;    FAMILY HISTORY: Family History  Problem Relation Age of Onset   Pulmonary disease Mother    Alzheimer's disease Mother    Heart disease Father    Alzheimer's disease Father    Parkinson's disease Neg Hx     SOCIAL HISTORY: Social History   Socioeconomic History   Marital status: Widowed    Spouse name: Not on file   Number  of children: 0   Years of education: Bachelors   Highest education level: Not on file  Occupational History   Occupation: Retired  Tobacco Use   Smoking status: Never   Smokeless tobacco: Never  Substance and Sexual Activity   Alcohol use: Not Currently    Comment: One glass per year. update 04/21/21 pt can't remember her last drink   Drug use: No   Sexual activity: Not on file  Other Topics Concern   Not on file  Social History Narrative   Lives at home, with adult stepdaughter, and brother.   Right-handed.   No caffeine use.   Social Determinants of Health   Financial Resource Strain: Not on file  Food Insecurity: Not on file  Transportation Needs: Not on file  Physical Activity: Not on file  Stress: Not on file  Social Connections: Not on file  Intimate Partner Violence: Not on file     PHYSICAL EXAM  GENERAL EXAM/CONSTITUTIONAL: Vitals:  Vitals:   04/26/22 1337  Weight: 103 lb (46.7 kg)  Height: 5' (1.524 m)   Body mass index is 20.12 kg/m. Wt Readings from Last 3 Encounters:  04/26/22 103 lb (46.7 kg)  11/08/21 110 lb (49.9 kg)  10/22/21 109 lb (49.4 kg)   Patient is in no distress; well developed, nourished and groomed; neck is supple  CARDIOVASCULAR: Examination of carotid arteries is normal; no carotid bruits Regular rate and rhythm, no murmurs Examination of peripheral vascular system by observation and palpation is normal  EYES: Ophthalmoscopic exam of  optic discs and posterior segments is normal; no papilledema or hemorrhages No results found.  MUSCULOSKELETAL: Gait, strength, tone, movements noted in Neurologic exam below  NEUROLOGIC: MENTAL STATUS:     10/22/2021    1:35 PM 04/21/2021    1:12 PM 08/05/2020    7:41 AM  MMSE - Mini Mental State Exam  Orientation to time '4 5 5  '$ Orientation to Place '5 5 5  '$ Registration '3 3 3  '$ Attention/ Calculation '3 2 3  '$ Recall '3 3 1  '$ Language- name 2 objects '2 2 2  '$ Language- repeat '1 1 1  '$ Language- follow 3 step command '3 3 3  '$ Language- read & follow direction '1 1 1  '$ Write a sentence '1 1 1  '$ Copy design 0 1 1  Total score '26 27 26   '$ awake, alert, oriented to person, place and time recent and remote memory intact normal attention and concentration language fluent, comprehension intact, naming intact fund of knowledge appropriate  CRANIAL NERVE:  2nd - no papilledema on fundoscopic exam 2nd, 3rd, 4th, 6th - pupils equal and reactive to light, visual fields full to confrontation, extraocular muscles intact, no nystagmus 5th - facial sensation symmetric 7th - facial strength symmetric 8th - hearing intact 9th - palate elevates symmetrically, uvula midline 11th - shoulder shrug symmetric 12th - tongue protrusion midline  MOTOR:  normal bulk and tone, full strength in the BUE, BLE MILD POSTURAL TREMOR NO RESTING TREMOR NO BRADYKINESIA; NO COGWHEELING RIGIDITY SLIGHT RESTLESS; AKATHISIA IN BLE  SENSORY:  normal and symmetric to light touch  COORDINATION:  finger-nose-finger, fine finger movements normal  REFLEXES:  deep tendon reflexes TRACE and symmetric  GAIT/STATION:  narrow based gait; SLOW, USING WALKER     DIAGNOSTIC DATA (LABS, IMAGING, TESTING) - I reviewed patient records, labs, notes, testing and imaging myself where available.  Lab Results  Component Value Date   WBC 11.2 (H) 08/25/2020   HGB 10.9 (L)  08/25/2020   HCT 33.3 (L) 08/25/2020   MCV 95.7 08/25/2020    PLT 218 08/25/2020      Component Value Date/Time   NA 137 08/25/2020 0328   K 3.5 08/25/2020 0328   CL 103 08/25/2020 0328   CO2 23 08/25/2020 0328   GLUCOSE 116 (H) 08/25/2020 0328   BUN 17 08/25/2020 0328   CREATININE 0.78 08/25/2020 0328   CALCIUM 8.6 (L) 08/25/2020 0328   PROT 6.9 08/22/2020 1329   ALBUMIN 4.1 08/22/2020 1329   AST 28 08/22/2020 1329   ALT 6 08/22/2020 1329   ALKPHOS 60 08/22/2020 1329   BILITOT 0.8 08/22/2020 1329   GFRNONAA >60 08/25/2020 0328   No results found for: "CHOL", "HDL", "LDLCALC", "LDLDIRECT", "TRIG", "CHOLHDL" No results found for: "HGBA1C" Lab Results  Component Value Date   VITAMINB12 268 10/14/2015   Lab Results  Component Value Date   TSH 2.250 10/14/2015    07/18/15 MRI of the lumbar spine without contrast shows the following: 1.    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  There is also mild retrolisthesis at this level. 2.    Mild degenerative changes in the lumbar spine with mild facet hypertrophy noted at most levels.   There is no nerve root compression. There is no spinal stenosis in the lumbar spine.  11/03/15 MRI of the brain without contrast shows the following: 1.    Mild extent of T2/FLAIR hyperintense foci in the pons and subcortical deep white matter of the hemispheres consistent with mild chronic microvascular ischemic change. Her to be acute. 2.    Minimal age appropriate cortical atrophy. 3.    There are no acute findings.   ASSESSMENT AND PLAN  77 y.o. year old female here with:   Dx:  1. Parkinsonism, unspecified Parkinsonism type   2. Memory change       PLAN:  PARKINSON'S DISEASE (symptoms since ~2011; diagnosed ~2017)  - continue carb/levo 25-100 mg 1.5tabs three times a day  - wean off pramipexole (due to ongoing hallucinations and age); reduce to twice a day x 1 week, then once daily x 1 week, then  stop  - continue SEROQUEL (quetiapine) 25 mg 1.5 tabs at bedtime; may consider nuplazid in future  Meds ordered this encounter  Medications   carbidopa-levodopa (SINEMET IR) 25-100 MG tablet    Sig: Take 1.5 tablets by mouth 3 (three) times daily.    Dispense:  405 tablet    Refill:  4   QUEtiapine (SEROQUEL) 25 MG tablet    Sig: Take 1.5 tablets (37.5 mg total) by mouth at bedtime. TAKE 1 AND 1/2 TABLETS BY MOUTH AT BEDTIME    Dispense:  150 tablet    Refill:  4   Return in about 6 months (around 10/27/2022) for with NP Ward Givens).    Penni Bombard, MD [id number], [id number] PM Certified in Neurology, Neurophysiology and Neuroimaging  [facility] [address] [place], [place] [address] [phone]

## 2022-05-03 ENCOUNTER — Telehealth: Payer: Self-pay | Admitting: Neurology

## 2022-05-03 ENCOUNTER — Encounter: Payer: Self-pay | Admitting: Neurology

## 2022-05-03 NOTE — Telephone Encounter (Signed)
Attempted to call the patient back. There was no answer and rang multiple times. No VM set up. Will try again and also message through [facility]

## 2022-05-03 NOTE — Telephone Encounter (Signed)
Pt called and stated she wants to talk to nurse about stopping medication. Stated she talk to Dr. Leta Baptist and he told her to stop taking medication but she forgot and stopped taking all her medication. Pt is requesting a call back from nurse

## 2022-05-04 NOTE — Telephone Encounter (Signed)
Pt called stating that she is needing to discuss more with the RN. I informed her that there is a detailed Mychart message for her that it has not been read, but the Pt states she does not have access to her Mychart. Pt states that she was doing everything wrong and is now not on any medication. Please advise.

## 2022-05-05 NOTE — Telephone Encounter (Signed)
Called the patient back and we reviewed with her the weaning process for coming off of the mirapex. Pt is now doing 1 tablet a day. She will continue that for the next 6 days and then stop the medication. Pt verbalized understanding. Pt had no questions at this time but was encouraged to call back if questions arise. Was appreciative of the call back

## 2022-05-20 DIAGNOSIS — G20A1 Parkinson's disease without dyskinesia, without mention of fluctuations: Secondary | ICD-10-CM | POA: Diagnosis not present

## 2022-05-20 DIAGNOSIS — Z8781 Personal history of (healed) traumatic fracture: Secondary | ICD-10-CM | POA: Diagnosis not present

## 2022-06-02 ENCOUNTER — Emergency Department (HOSPITAL_BASED_OUTPATIENT_CLINIC_OR_DEPARTMENT_OTHER): Payer: Medicare HMO

## 2022-06-02 ENCOUNTER — Emergency Department (HOSPITAL_BASED_OUTPATIENT_CLINIC_OR_DEPARTMENT_OTHER)
Admission: EM | Admit: 2022-06-02 | Discharge: 2022-06-02 | Disposition: A | Discharge status: Home / Self Care | Payer: Medicare HMO | Attending: Emergency Medicine | Admitting: Emergency Medicine

## 2022-06-02 ENCOUNTER — Other Ambulatory Visit: Payer: Self-pay

## 2022-06-02 DIAGNOSIS — Z043 Encounter for examination and observation following other accident: Secondary | ICD-10-CM | POA: Diagnosis not present

## 2022-06-02 DIAGNOSIS — S32502A Unspecified fracture of left pubis, initial encounter for closed fracture: Secondary | ICD-10-CM | POA: Diagnosis not present

## 2022-06-02 DIAGNOSIS — S32512A Fracture of superior rim of left pubis, initial encounter for closed fracture: Secondary | ICD-10-CM | POA: Diagnosis not present

## 2022-06-02 DIAGNOSIS — W1830XA Fall on same level, unspecified, initial encounter: Secondary | ICD-10-CM | POA: Diagnosis not present

## 2022-06-02 DIAGNOSIS — Y92000 Kitchen of unspecified non-institutional (private) residence as  the place of occurrence of the external cause: Secondary | ICD-10-CM | POA: Diagnosis not present

## 2022-06-02 DIAGNOSIS — S32592A Other specified fracture of left pubis, initial encounter for closed fracture: Secondary | ICD-10-CM

## 2022-06-02 DIAGNOSIS — I10 Essential (primary) hypertension: Secondary | ICD-10-CM | POA: Insufficient documentation

## 2022-06-02 DIAGNOSIS — M25552 Pain in left hip: Secondary | ICD-10-CM | POA: Insufficient documentation

## 2022-06-02 DIAGNOSIS — Z79899 Other long term (current) drug therapy: Secondary | ICD-10-CM | POA: Insufficient documentation

## 2022-06-02 DIAGNOSIS — G20C Parkinsonism, unspecified: Secondary | ICD-10-CM | POA: Diagnosis not present

## 2022-06-02 DIAGNOSIS — Z96642 Presence of left artificial hip joint: Secondary | ICD-10-CM | POA: Diagnosis not present

## 2022-06-02 MED ORDER — HYDROCODONE-ACETAMINOPHEN 5-325 MG PO TABS: 1.0000 | ORAL_TABLET | ORAL | 0 refills | Status: DC | PRN

## 2022-06-02 MED ORDER — CELECOXIB 200 MG PO CAPS: 200.0000 mg | ORAL_CAPSULE | Freq: Two times a day (BID) | ORAL | 0 refills | Status: DC

## 2022-06-02 MED ORDER — HYDROCODONE-ACETAMINOPHEN 5-325 MG PO TABS
1.0000 | ORAL_TABLET | Freq: Once | ORAL | Status: AC
  Administered 2022-06-02: 1 via ORAL
  Filled 2022-06-02: qty 1

## 2022-06-02 NOTE — ED Notes (Signed)
RN reviewed discharge instructions with pt. Pt verbalized understanding and had no further questions. VSS upon discharge.  

## 2022-06-02 NOTE — ED Triage Notes (Signed)
Pt arrives arrives to ED POV C/O fall. Pt states she tripped and fell in her kitchen. Denies LOC and denies blood thinners. States it hurts to bare weight on Left Leg.

## 2022-06-02 NOTE — ED Provider Notes (Signed)
[facility] AT [facility] Provider Note   CSN: [id number] Arrival date & time: 06/02/22  1648     History  Chief Complaint  Patient presents with   Patricia Fitzpatrick    Patricia Fitzpatrick is a 77 y.o. female.  Pt is a 77 yo female with pmhx significant for htn, high cholesterol, depression, anxiety, ocd, and parkinson's disease.  Pt fell in her kitchen today.  She denies loc.  No blood thinners.  She does have a left hip pain.  Pt has been able to bear weight.       Home Medications Prior to Admission medications   Medication Sig Start Date End Date Taking? Authorizing Provider  celecoxib (CELEBREX) 200 MG capsule Take 1 capsule (200 mg total) by mouth 2 (two) times daily. 06/02/22  Yes Jacalyn Lefevre, MD  HYDROcodone-acetaminophen (NORCO/VICODIN) 5-325 MG tablet Take 1 tablet by mouth every 4 (four) hours as needed. 06/02/22  Yes Jacalyn Lefevre, MD  acetaminophen (TYLENOL) 500 MG tablet Take 1 tablet (500 mg total) by mouth every 6 (six) hours as needed for moderate pain. Hold while on norco Patient taking differently: Take 500 mg by mouth as needed for mild pain or moderate pain. 08/25/20   Lewie Chamber, MD  B Complex Vitamins (B COMPLEX PO) Take 1 tablet by mouth daily.    [provider]  carbidopa-levodopa (SINEMET IR) 25-100 MG tablet Take 1.5 tablets by mouth 3 (three) times daily. 04/26/22   Penumalli, Glenford Bayley, MD  Cholecalciferol (VITAMIN D3 PO) Take 50 mcg by mouth daily.    [provider]  denosumab (PROLIA) 60 MG/ML SOSY injection every 6 (six) months.    [provider]  FLUoxetine (PROZAC) 40 MG capsule Take 40 mg by mouth 2 (two) times daily.    [provider]  QUEtiapine (SEROQUEL) 25 MG tablet Take 1.5 tablets (37.5 mg total) by mouth at bedtime. TAKE 1 AND 1/2 TABLETS BY MOUTH AT BEDTIME 04/26/22   Penumalli, Glenford Bayley, MD  vitamin C (ASCORBIC ACID) 500 MG tablet Take 500 mg by mouth daily.    [provider]      Allergies    Patient has no known allergies.    Review of Systems   Review of Systems  Musculoskeletal:        Left hip pain  All other systems reviewed and are negative.   Physical Exam Updated Vital Signs BP (!) 149/79   Pulse 64   Temp 98 F (36.7 C)   Resp 18   Ht 5' (1.524 m)   Wt 47.2 kg   SpO2 98%   BMI 20.31 kg/m  Physical Exam Vitals and nursing note reviewed.  Constitutional:      Appearance: Normal appearance.  HENT:     Head: Normocephalic and atraumatic.     Right Ear: External ear normal.     Left Ear: External ear normal.     Nose: Nose normal.     Mouth/Throat:     Mouth: Mucous membranes are moist.     Pharynx: Oropharynx is clear.  Eyes:     Extraocular Movements: Extraocular movements intact.     Conjunctiva/sclera: Conjunctivae normal.     Pupils: Pupils are equal, round, and reactive to light.  Cardiovascular:     Rate and Rhythm: Normal rate and regular rhythm.     Pulses: Normal pulses.     Heart sounds: Normal heart sounds.  Pulmonary:     Effort: Pulmonary  effort is normal.     Breath sounds: Normal breath sounds.  Abdominal:     General: Abdomen is flat. Bowel sounds are normal.     Palpations: Abdomen is soft.  Musculoskeletal:     Cervical back: Normal range of motion and neck supple.     Left hip: Tenderness present.  Skin:    General: Skin is warm.     Capillary Refill: Capillary refill takes less than 2 seconds.  Neurological:     General: No focal deficit present.     Mental Status: She is alert and oriented to person, place, and time.  Psychiatric:        Mood and Affect: Mood normal.        Behavior: Behavior normal.     ED Results / Procedures / Treatments   Labs (all labs ordered are listed, but only abnormal results are displayed) Labs Reviewed - No data to display  EKG None  Radiology DG Hip Unilat With Pelvis 2-3 Views Left  Result Date: 06/02/2022 CLINICAL DATA:  Fall EXAM: DG HIP (WITH OR  WITHOUT PELVIS) 3V LEFT COMPARISON:  Left hip radiograph dated November 08, 2021 FINDINGS: Prior left total hip replacement. Evidence of periprosthetic fracture. No evidence of dislocation. Chronic osseous deformity of the right pubic bone. Old fracture of the inferior left pubic ramus. New mild osseous irregularity of the left superior pubic ramus. IMPRESSION: 1. New mild osseous irregularity of the left superior pubic ramus, suspicious for nondisplaced fracture. Recommend cross-sectional imaging for further evaluation. 2. 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 Electronically Signed   By: Allegra Lai M.D.   On: 06/02/2022 18:25    Procedures Procedures    Medications Ordered in ED Medications  HYDROcodone-acetaminophen (NORCO/VICODIN) 5-325 MG per tablet 1 tablet (1 tablet Oral Given 06/02/22 1904)    ED Course/ Medical Decision Making/ A&P                             Medical Decision Making Amount and/or Complexity of Data Reviewed Radiology: ordered.  Risk Prescription drug management.   This patient presents to the ED for concern of fall, this involves an extensive number of treatment options, and is a complaint that carries with it a high risk of complications and morbidity.  The differential diagnosis includes multiple trauma   Co morbidities that complicate the patient evaluation   htn, high cholesterol, depression, anxiety, ocd, and parkinson's disease   Additional history obtained:  Additional history obtained from epic chart review External records from outside source obtained and reviewed including family   Imaging Studies ordered:  I ordered imaging studies including ct head/c-spine, hip  I independently visualized and interpreted imaging which showed  L hip: New mild osseous irregularity of the left superior pubic ramus,  suspicious for nondisplaced fracture. Recommend cross-sectional  imaging for  further evaluation.  2. Prior left total hip replacement without evidence of dislocation  or periprosthetic fracture.   I agree with the radiologist interpretation   Medicines ordered and prescription drug management:  I ordered medication including lortab  for pain  Reevaluation of the patient after these medicines showed that the patient improved I have reviewed the patients home medicines and have made adjustments as needed   Test Considered:  ct  Problem List / ED Course:  Left hip inferior pubic rami fx:  pt is able to bear weight.  She has  a walker at home.  She has fx her pelvis in the past and saw Dr. [name].  She wants to f/u with him again.  Pt is stable for d/c.  Return if worse.    Reevaluation:  After the interventions noted above, I reevaluated the patient and found that they have :improved   Social Determinants of Health:  Lives at home   Dispostion:  After consideration of the diagnostic results and the patients response to treatment, I feel that the patent would benefit from discharge with outpatient f/u.          Final Clinical Impression(s) / ED Diagnoses Final diagnoses:  Other closed fracture of left pubis, initial encounter    Rx / DC Orders ED Discharge Orders          Ordered    HYDROcodone-acetaminophen (NORCO/VICODIN) 5-325 MG tablet  Every 4 hours PRN        06/02/22 1852    celecoxib (CELEBREX) 200 MG capsule  2 times daily        06/02/22 1854              Jacalyn Lefevre, MD 06/02/22 1944

## 2022-06-04 ENCOUNTER — Telehealth: Payer: Self-pay | Admitting: *Deleted

## 2022-06-04 NOTE — Telephone Encounter (Signed)
        Patient  visited [facility] on 06/02/2022  for treatment   Telephone encounter attempt :  1st  A HIPAA compliant voice message was left requesting a return call.  Instructed patient to call back at [phone].  Yehuda Mao [facility] -[facility] [employer], Population Health [phone] [address] [place] , [place] [address] Email : [email or web address]

## 2022-06-07 ENCOUNTER — Telehealth: Payer: Self-pay | Admitting: *Deleted

## 2022-06-07 NOTE — Telephone Encounter (Signed)
        Patient  visited [facility] on 4/17  for treatment    Telephone encounter attempt :  2nd  A HIPAA compliant voice message was left requesting a return call.  Instructed patient to call back at [phone].  Yehuda Mao [facility] -[facility] [place], Population Health [phone] [address] [place] , [place] [address] Email : [email or web address]

## 2022-06-08 DIAGNOSIS — S7002XD Contusion of left hip, subsequent encounter: Secondary | ICD-10-CM | POA: Diagnosis not present

## 2022-07-13 DIAGNOSIS — E785 Hyperlipidemia, unspecified: Secondary | ICD-10-CM | POA: Diagnosis not present

## 2022-07-13 DIAGNOSIS — M810 Age-related osteoporosis without current pathological fracture: Secondary | ICD-10-CM | POA: Diagnosis not present

## 2022-07-13 DIAGNOSIS — I700 Atherosclerosis of aorta: Secondary | ICD-10-CM | POA: Diagnosis not present

## 2022-07-13 DIAGNOSIS — D692 Other nonthrombocytopenic purpura: Secondary | ICD-10-CM | POA: Diagnosis not present

## 2022-07-13 DIAGNOSIS — S32599S Other specified fracture of unspecified pubis, sequela: Secondary | ICD-10-CM | POA: Diagnosis not present

## 2022-07-13 DIAGNOSIS — G20A1 Parkinson's disease without dyskinesia, without mention of fluctuations: Secondary | ICD-10-CM | POA: Diagnosis not present

## 2022-07-13 DIAGNOSIS — F329 Major depressive disorder, single episode, unspecified: Secondary | ICD-10-CM | POA: Diagnosis not present

## 2022-07-13 DIAGNOSIS — I10 Essential (primary) hypertension: Secondary | ICD-10-CM | POA: Diagnosis not present

## 2022-07-13 DIAGNOSIS — E46 Unspecified protein-calorie malnutrition: Secondary | ICD-10-CM | POA: Diagnosis not present

## 2022-07-16 ENCOUNTER — Other Ambulatory Visit (HOSPITAL_COMMUNITY): Payer: Self-pay | Admitting: *Deleted

## 2022-07-19 ENCOUNTER — Inpatient Hospital Stay (HOSPITAL_COMMUNITY): Admission: RE | Admit: 2022-07-19 | Payer: Medicare HMO | Source: Ambulatory Visit

## 2022-07-20 ENCOUNTER — Telehealth: Payer: Self-pay | Admitting: Diagnostic Neuroimaging

## 2022-07-20 NOTE — Telephone Encounter (Signed)
Pt is asking for a call to discuss :QUEtiapine ,  carbidopa-levodopa (SINEMET IR) 25-100 MG tablet & new supplement recently started.  She would like to discuss what she should continue to take and what she should consider coming off of.

## 2022-07-20 NOTE — Telephone Encounter (Signed)
Called patient to check what medication she on the verify what going on with her Medication. Patient she is still taking carb/levo, she stop her Seroquel and still taking Pramipexole that cause her hallucinations. I informed her to look at the notes in her My chart back in March to see the medication we have informed her to take. Patient states she will need her niece to read her my chart and call back tomorrow.

## 2022-08-11 ENCOUNTER — Ambulatory Visit: Payer: Medicare PPO | Admitting: Professional Counselor

## 2022-08-17 ENCOUNTER — Ambulatory Visit (INDEPENDENT_AMBULATORY_CARE_PROVIDER_SITE_OTHER): Payer: Medicare PPO | Admitting: Professional Counselor

## 2022-08-17 DIAGNOSIS — F411 Generalized anxiety disorder: Secondary | ICD-10-CM

## 2022-08-17 DIAGNOSIS — F331 Major depressive disorder, recurrent, moderate: Secondary | ICD-10-CM

## 2022-08-17 NOTE — Progress Notes (Addendum)
[facility] Counselor Initial Adult Exam  Name: Patricia Fitzpatrick Date: 7.2.24 MRN: [id number] DOB: [birth year] PCP: [name], MD  Time spent: 2:00p - 3:05p   Guardian/Payee:  pt    Paperwork requested:  No   Reason for Visit /Presenting Problem: depression, anxiety  Mental Status Exam:    Appearance:   Neat     Behavior:  Appropriate and Sharing  Motor:  Some facial trembling   Speech/Language:   Normal Rate  Affect:  Congruent  Mood:  normal  Thought process:  normal  Thought content:    WNL  Sensory/Perceptual disturbances:    Hallucinations pt reports may be medicine related, per consult w neurology  Orientation:  oriented to person, place, time  Attention:  Good  Concentration:  WNL  Memory:  WNL  Fund of knowledge:   Fair  Insight:    Fair  Judgment:   Fair  Impulse Control:  Good   Reported Symptoms:  worries, fears, stress, compulsive behavior (per OCD), nervousness, irritability, trouble concentrating, sadness, low self esteem, interpersonal concerns, support system concerns   Risk Assessment: Danger to Self:  No Self-injurious Behavior: No Danger to Others: No Duty to Warn:no Physical Aggression / Violence:No  Access to Firearms a concern: No  Gang Involvement: No Patient / guardian was educated about steps to take if suicide or homicide risk level increases between visits: n/a While future psychiatric events cannot be accurately predicted, the patient does not currently require acute inpatient psychiatric care and does not currently meet [facility] involuntary commitment criteria.  Substance Abuse History: Current substance abuse: No     Past Psychiatric History:   Previous psychological history is significant for depression and OCD Outpatient Providers:No History of Psych Hospitalization: Yes, college years for anxiety and depression; pt reported as helpful Psychological Testing:  No    Abuse History: Victim: TBD Report needed: Not at this  time Victim of Neglect: TBD Perpetrator of  n/a   Witness / Exposure to Domestic Violence: No   Protective Services Involvement: No  Witness to [employer] Violence:  No   Family History:  Family History  Problem Relation Age of Onset   Pulmonary disease Mother    Alzheimer's disease Mother    Heart disease Father    Alzheimer's disease Father    Parkinson's disease Neg Hx     Living situation: the patient lives with their family  Sexual Orientation:   n/a  Relationship Status: widowed  Name of spouse / other: n/a             If a parent, number of children / ages: three adult adult stepchildren   Support Systems; TBD   Financial Stress:   some  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   Financial planner: No   Educational History: Education: college graduate  Religion/Sprituality/World View:   Christian  Any cultural differences that may affect / interfere with treatment:  n/a  Recreation/Hobbies: not assessed at this time   Stressors:Other: health, family life, home maintenance     Strengths:  Self Advocate, Able to Communicate Effectively, and mental sharpness, resiliency, sense of humor   Barriers:  transportation challenges (dependent on others), support system challenges    Legal History: Pending legal issue / charges: The patient has no significant history of legal issues. History of legal issue / charges:  n/a  Medical History/Surgical History:reviewed Past Medical History:  Diagnosis Date   Anxiety    Cataracts, bilateral    Depression  High cholesterol    Hypertension    OCD (obsessive compulsive disorder)    Parkinsonism 10/14/2015   Tremor     Past Surgical History:  Procedure Laterality Date   BREAST BIOPSY  1981   CATARACT EXTRACTION Right 2017   EYE SURGERY  1952   HIP ARTHROPLASTY Left 08/23/2020   Procedure: ARTHROPLASTY BIPOLAR HIP (HEMIARTHROPLASTY);  Surgeon: Bjorn Pippin, MD;  Location: [facility];  Service: Orthopedics;  Laterality: Left;    Medications: Current Outpatient Medications  Medication Sig Dispense Refill   acetaminophen (TYLENOL) 500 MG tablet Take 1 tablet (500 mg total) by mouth every 6 (six) hours as needed for moderate pain. Hold while on norco (Patient taking differently: Take 500 mg by mouth as needed for mild pain or moderate pain.) 30 tablet 0   B Complex Vitamins (B COMPLEX PO) Take 1 tablet by mouth daily.     carbidopa-levodopa (SINEMET IR) 25-100 MG tablet Take 1.5 tablets by mouth 3 (three) times daily. 405 tablet 4   celecoxib (CELEBREX) 200 MG capsule Take 1 capsule (200 mg total) by mouth 2 (two) times daily. 60 capsule 0   Cholecalciferol (VITAMIN D3 PO) Take 50 mcg by mouth daily.     denosumab (PROLIA) 60 MG/ML SOSY injection every 6 (six) months.     FLUoxetine (PROZAC) 40 MG capsule Take 40 mg by mouth 2 (two) times daily.     HYDROcodone-acetaminophen (NORCO/VICODIN) 5-325 MG tablet Take 1 tablet by mouth every 4 (four) hours as needed. 10 tablet 0   QUEtiapine (SEROQUEL) 25 MG tablet Take 1.5 tablets (37.5 mg total) by mouth at bedtime. TAKE 1 AND 1/2 TABLETS BY MOUTH AT BEDTIME 150 tablet 4   vitamin C (ASCORBIC ACID) 500 MG tablet Take 500 mg by mouth daily.     No current facility-administered medications for this visit.    No Known Allergies  Diagnoses:    ICD-10-CM   1. Major depressive disorder, recurrent episode, moderate (HCC)  F33.1     2. Generalized anxiety disorder  F41.1      Treatment Provided: Counselor provided person-centered counseling including active listening, affirmation, resourcing; advocacy; clinical assessment; facilitated PHQ (16) and GAD (13). Counselor and pt built rapport. Pt was brought to appt by a family friend. Pt presented to session with her walker and showed difficulty with mobility; she also presented with symptoms of Parkinson's. Pt reported having anticipated appt with counselor to be appt with a  prescribing provider; she asked for counselor assistance in contacting pharmacy due to her medications being low in number; call to pharmacy placed, and pharmacy tech voiced refill to be denied. Pt and counselor discussed pt calling psychiatric provider (Dr. Evelene Croon) to resolve issue after session so as to maximize remainder of session together, because pt expressed that while she had thought she had made a psychiatric appt this day, she was also agreeable to ongoing care with current provider, Dr. Evelene Croon, with a possible transition to psychiatric care at [facility], and that regardless of either outcome, pt wished to continue with therapy with counselor, which counselor recommended.  Counselor assessed pt for anxiety and depression, and pt voiced anxiety in particular to be very impactful to quality of life over time, exacerbated within recent months. Pt described home life as challenging and stressful due to relationships and concerns therein; she reported living with her adult brother and stepdaughter, both in their sixties, and being concerned about the former's state of overall wellness. She reported concerns where  support to her and to household is concerned. She reported needs including household as maintained by her husband's social security. Pt reported significant grief/loss hx in having lost her two husbands and therefore having twice been widowed. She reports having been widowed most recently in 2020, and for her symptoms of anxiety, depression and OCD to be worsening ever since.  Pt reported an increased sense of uncertainty about her life and faith, and a decreased sense of feeling well and confident, in recent months. Pt described her OCD symptoms as centering around a fear of hurting others inadvertently by failure to enact certain behaviors, superstitious in nature. Pt exhibited mental sharpness and sense of humor, and ability to communicate well, however she exhibited difficulty in completing  assessments and intake paperwork, which counselor, staff and her caregiver for the visit assisted with. Counselor voiced her concern for pt overall well-being and circumstances, and showed pt [employer] and how to navigate. Counselor gave pt information for follow-up at home, as pt identified access to and adeptness with internet and computer use. Counselor reinforced strong recommendation for ongoing sessions with counselor, and helped facilitate pt making follow-up appt, and an appt with medical provider, via admin staff so that pt receive care at earliest availability.   Plan of Care: Pt to follow up at earliest availability and to attend weekly sessions as able. Pt to attend appt with prescribing provider for consultation. Pt to contact [employer] for additional advocacy and educative needs. Counselor will continue to assess pt symptoms including as relates OCD presentation, with treatment plan to be discerned according to results. Counselor will continue to assess pt safety and support sytem needs, and home life environment and dynamics, and advocate for pt well-being and needs as indicated within her scope of practice. Counselor will recommend family therapy to address pt needs as indicated. Counselor intends to consult with lead physician of practice upon return form PTO to discuss advocacy measures as indicated.   Gaspar Bidding, [facility]

## 2022-08-18 ENCOUNTER — Telehealth: Payer: Self-pay | Admitting: Adult Health

## 2022-08-18 NOTE — Telephone Encounter (Signed)
Next visit is 09/06/22. Requesting Prozac 40 mg called to:  [id number] - [place], [place] - [address]   Phone: [phone]  Fax: [phone]

## 2022-08-18 NOTE — Telephone Encounter (Signed)
Called patient. She saw [facility] yesterday but has not seen [name] yet. She called PCP and they sent in a prescription for her.

## 2022-08-18 NOTE — Telephone Encounter (Signed)
Pt Lvm at 11:36a stating she had contacted the doctor's office and the pharmacy and has not been able to talk to anyone.  I advised her to contact the Dr's office again, since they would need to send the refill in to the pharmacy anyway.

## 2022-08-18 NOTE — Telephone Encounter (Signed)
New patient. She saw [facility] yesterday. She has an appt with [name] on 7/22. She has called repeatedly today asking for Prozac. She has been told multiple times that we could not send in a RF because she had not been seen by [place]. She was advised to contact PCP. She said she had and they were going to send in an Rx, but then continues to call and saw PCP won't RF because she hasn't been seen.

## 2022-08-20 ENCOUNTER — Encounter: Payer: Self-pay | Admitting: Professional Counselor

## 2022-08-20 NOTE — Telephone Encounter (Signed)
Patient called again today. I told her that Patricia Fitzpatrick was a therapist and could not prescribe medications. She has an appt with [name] on 7/22. I told her we could not prescribe medication until she is seen. I told her to ask her PCP to give her a supply to get her until her appt.

## 2022-08-20 NOTE — Patient Instructions (Signed)
Pt to follow up at earliest availability and to attend weekly sessions as able. Pt to attend appt with prescribing provider for consultation. Pt to contact [employer] for additional advocacy and educative needs.

## 2022-08-30 ENCOUNTER — Encounter (HOSPITAL_BASED_OUTPATIENT_CLINIC_OR_DEPARTMENT_OTHER): Payer: Self-pay

## 2022-08-30 ENCOUNTER — Emergency Department (HOSPITAL_BASED_OUTPATIENT_CLINIC_OR_DEPARTMENT_OTHER): Payer: Medicare PPO

## 2022-08-30 ENCOUNTER — Other Ambulatory Visit: Payer: Self-pay

## 2022-08-30 ENCOUNTER — Inpatient Hospital Stay (HOSPITAL_BASED_OUTPATIENT_CLINIC_OR_DEPARTMENT_OTHER)
Admission: EM | Admit: 2022-08-30 | Discharge: 2022-09-03 | DRG: 543 | Disposition: A | Discharge status: Skilled Nursing Facility | Payer: Medicare PPO | Attending: Internal Medicine | Admitting: Internal Medicine

## 2022-08-30 DIAGNOSIS — R54 Age-related physical debility: Secondary | ICD-10-CM | POA: Diagnosis present

## 2022-08-30 DIAGNOSIS — Z8249 Family history of ischemic heart disease and other diseases of the circulatory system: Secondary | ICD-10-CM | POA: Diagnosis not present

## 2022-08-30 DIAGNOSIS — S32592D Other specified fracture of left pubis, subsequent encounter for fracture with routine healing: Secondary | ICD-10-CM | POA: Diagnosis not present

## 2022-08-30 DIAGNOSIS — R1311 Dysphagia, oral phase: Secondary | ICD-10-CM | POA: Diagnosis not present

## 2022-08-30 DIAGNOSIS — M4856XA Collapsed vertebra, not elsewhere classified, lumbar region, initial encounter for fracture: Secondary | ICD-10-CM | POA: Diagnosis not present

## 2022-08-30 DIAGNOSIS — N2889 Other specified disorders of kidney and ureter: Secondary | ICD-10-CM | POA: Diagnosis present

## 2022-08-30 DIAGNOSIS — K769 Liver disease, unspecified: Secondary | ICD-10-CM | POA: Diagnosis not present

## 2022-08-30 DIAGNOSIS — Y92009 Unspecified place in unspecified non-institutional (private) residence as the place of occurrence of the external cause: Secondary | ICD-10-CM | POA: Diagnosis not present

## 2022-08-30 DIAGNOSIS — F32A Depression, unspecified: Secondary | ICD-10-CM | POA: Diagnosis not present

## 2022-08-30 DIAGNOSIS — S32010A Wedge compression fracture of first lumbar vertebra, initial encounter for closed fracture: Secondary | ICD-10-CM

## 2022-08-30 DIAGNOSIS — I700 Atherosclerosis of aorta: Secondary | ICD-10-CM | POA: Diagnosis not present

## 2022-08-30 DIAGNOSIS — Z96642 Presence of left artificial hip joint: Secondary | ICD-10-CM | POA: Diagnosis present

## 2022-08-30 DIAGNOSIS — W19XXXA Unspecified fall, initial encounter: Secondary | ICD-10-CM | POA: Diagnosis not present

## 2022-08-30 DIAGNOSIS — R451 Restlessness and agitation: Secondary | ICD-10-CM | POA: Diagnosis present

## 2022-08-30 DIAGNOSIS — M6281 Muscle weakness (generalized): Secondary | ICD-10-CM | POA: Diagnosis not present

## 2022-08-30 DIAGNOSIS — S72112A Displaced fracture of greater trochanter of left femur, initial encounter for closed fracture: Secondary | ICD-10-CM | POA: Diagnosis present

## 2022-08-30 DIAGNOSIS — Z681 Body mass index (BMI) 19 or less, adult: Secondary | ICD-10-CM | POA: Diagnosis not present

## 2022-08-30 DIAGNOSIS — E7800 Pure hypercholesterolemia, unspecified: Secondary | ICD-10-CM | POA: Diagnosis present

## 2022-08-30 DIAGNOSIS — N1330 Unspecified hydronephrosis: Secondary | ICD-10-CM | POA: Diagnosis present

## 2022-08-30 DIAGNOSIS — Z9181 History of falling: Secondary | ICD-10-CM | POA: Diagnosis not present

## 2022-08-30 DIAGNOSIS — Z7409 Other reduced mobility: Secondary | ICD-10-CM | POA: Diagnosis not present

## 2022-08-30 DIAGNOSIS — R2689 Other abnormalities of gait and mobility: Secondary | ICD-10-CM | POA: Diagnosis not present

## 2022-08-30 DIAGNOSIS — Z820 Family history of epilepsy and other diseases of the nervous system: Secondary | ICD-10-CM | POA: Diagnosis not present

## 2022-08-30 DIAGNOSIS — S32010D Wedge compression fracture of first lumbar vertebra, subsequent encounter for fracture with routine healing: Secondary | ICD-10-CM | POA: Diagnosis not present

## 2022-08-30 DIAGNOSIS — Z79899 Other long term (current) drug therapy: Secondary | ICD-10-CM

## 2022-08-30 DIAGNOSIS — S22081A Stable burst fracture of T11-T12 vertebra, initial encounter for closed fracture: Secondary | ICD-10-CM | POA: Diagnosis not present

## 2022-08-30 DIAGNOSIS — M549 Dorsalgia, unspecified: Secondary | ICD-10-CM | POA: Diagnosis not present

## 2022-08-30 DIAGNOSIS — S32019A Unspecified fracture of first lumbar vertebra, initial encounter for closed fracture: Principal | ICD-10-CM

## 2022-08-30 DIAGNOSIS — F332 Major depressive disorder, recurrent severe without psychotic features: Secondary | ICD-10-CM | POA: Diagnosis not present

## 2022-08-30 DIAGNOSIS — W19XXXD Unspecified fall, subsequent encounter: Secondary | ICD-10-CM

## 2022-08-30 DIAGNOSIS — R339 Retention of urine, unspecified: Secondary | ICD-10-CM | POA: Diagnosis present

## 2022-08-30 DIAGNOSIS — G20A1 Parkinson's disease without dyskinesia, without mention of fluctuations: Secondary | ICD-10-CM | POA: Diagnosis not present

## 2022-08-30 DIAGNOSIS — R636 Underweight: Secondary | ICD-10-CM | POA: Diagnosis present

## 2022-08-30 DIAGNOSIS — W010XXD Fall on same level from slipping, tripping and stumbling without subsequent striking against object, subsequent encounter: Secondary | ICD-10-CM | POA: Diagnosis present

## 2022-08-30 DIAGNOSIS — M48061 Spinal stenosis, lumbar region without neurogenic claudication: Secondary | ICD-10-CM | POA: Diagnosis not present

## 2022-08-30 DIAGNOSIS — N130 Hydronephrosis with ureteropelvic junction obstruction: Secondary | ICD-10-CM | POA: Diagnosis not present

## 2022-08-30 DIAGNOSIS — R531 Weakness: Secondary | ICD-10-CM | POA: Diagnosis not present

## 2022-08-30 DIAGNOSIS — I10 Essential (primary) hypertension: Secondary | ICD-10-CM | POA: Diagnosis present

## 2022-08-30 DIAGNOSIS — R296 Repeated falls: Secondary | ICD-10-CM

## 2022-08-30 DIAGNOSIS — R4182 Altered mental status, unspecified: Secondary | ICD-10-CM | POA: Diagnosis present

## 2022-08-30 DIAGNOSIS — H5702 Anisocoria: Secondary | ICD-10-CM | POA: Diagnosis present

## 2022-08-30 DIAGNOSIS — Z791 Long term (current) use of non-steroidal anti-inflammatories (NSAID): Secondary | ICD-10-CM

## 2022-08-30 DIAGNOSIS — R2681 Unsteadiness on feet: Secondary | ICD-10-CM | POA: Diagnosis not present

## 2022-08-30 DIAGNOSIS — D49511 Neoplasm of unspecified behavior of right kidney: Secondary | ICD-10-CM | POA: Diagnosis not present

## 2022-08-30 DIAGNOSIS — M4804 Spinal stenosis, thoracic region: Secondary | ICD-10-CM | POA: Diagnosis not present

## 2022-08-30 DIAGNOSIS — R3914 Feeling of incomplete bladder emptying: Secondary | ICD-10-CM | POA: Diagnosis not present

## 2022-08-30 DIAGNOSIS — I6782 Cerebral ischemia: Secondary | ICD-10-CM | POA: Diagnosis not present

## 2022-08-30 DIAGNOSIS — I6523 Occlusion and stenosis of bilateral carotid arteries: Secondary | ICD-10-CM | POA: Diagnosis not present

## 2022-08-30 DIAGNOSIS — F419 Anxiety disorder, unspecified: Secondary | ICD-10-CM | POA: Diagnosis present

## 2022-08-30 DIAGNOSIS — S72002D Fracture of unspecified part of neck of left femur, subsequent encounter for closed fracture with routine healing: Secondary | ICD-10-CM | POA: Diagnosis not present

## 2022-08-30 DIAGNOSIS — S72115A Nondisplaced fracture of greater trochanter of left femur, initial encounter for closed fracture: Secondary | ICD-10-CM | POA: Diagnosis not present

## 2022-08-30 DIAGNOSIS — Z7401 Bed confinement status: Secondary | ICD-10-CM | POA: Diagnosis not present

## 2022-08-30 DIAGNOSIS — R29818 Other symptoms and signs involving the nervous system: Secondary | ICD-10-CM | POA: Diagnosis not present

## 2022-08-30 DIAGNOSIS — I69828 Other speech and language deficits following other cerebrovascular disease: Secondary | ICD-10-CM | POA: Diagnosis not present

## 2022-08-30 DIAGNOSIS — I69391 Dysphagia following cerebral infarction: Secondary | ICD-10-CM | POA: Diagnosis not present

## 2022-08-30 DIAGNOSIS — E860 Dehydration: Secondary | ICD-10-CM | POA: Insufficient documentation

## 2022-08-30 DIAGNOSIS — R569 Unspecified convulsions: Secondary | ICD-10-CM | POA: Diagnosis not present

## 2022-08-30 DIAGNOSIS — R41841 Cognitive communication deficit: Secondary | ICD-10-CM | POA: Diagnosis not present

## 2022-08-30 DIAGNOSIS — Z825 Family history of asthma and other chronic lower respiratory diseases: Secondary | ICD-10-CM

## 2022-08-30 DIAGNOSIS — F429 Obsessive-compulsive disorder, unspecified: Secondary | ICD-10-CM | POA: Diagnosis present

## 2022-08-30 DIAGNOSIS — N281 Cyst of kidney, acquired: Secondary | ICD-10-CM | POA: Diagnosis not present

## 2022-08-30 DIAGNOSIS — K429 Umbilical hernia without obstruction or gangrene: Secondary | ICD-10-CM | POA: Diagnosis not present

## 2022-08-30 LAB — CBC WITH DIFFERENTIAL/PLATELET
Abs Immature Granulocytes: 0.04 10*3/uL (ref 0.00–0.07)
Basophils Absolute: 0 10*3/uL (ref 0.0–0.1)
Basophils Relative: 0 %
Eosinophils Absolute: 0 10*3/uL (ref 0.0–0.5)
Eosinophils Relative: 0 %
HCT: 44.3 % (ref 36.0–46.0)
Hemoglobin: 14.5 g/dL (ref 12.0–15.0)
Immature Granulocytes: 0 %
Lymphocytes Relative: 6 %
Lymphs Abs: 0.5 10*3/uL — ABNORMAL LOW (ref 0.7–4.0)
MCH: 31 pg (ref 26.0–34.0)
MCHC: 32.7 g/dL (ref 30.0–36.0)
MCV: 94.9 fL (ref 80.0–100.0)
Monocytes Absolute: 0.7 10*3/uL (ref 0.1–1.0)
Monocytes Relative: 7 %
Neutro Abs: 7.8 10*3/uL — ABNORMAL HIGH (ref 1.7–7.7)
Neutrophils Relative %: 87 %
Platelets: 244 10*3/uL (ref 150–400)
RBC: 4.67 MIL/uL (ref 3.87–5.11)
RDW: 12.7 % (ref 11.5–15.5)
WBC: 9.1 10*3/uL (ref 4.0–10.5)
nRBC: 0 % (ref 0.0–0.2)

## 2022-08-30 LAB — BASIC METABOLIC PANEL
Anion gap: 9 (ref 5–15)
BUN: 29 mg/dL — ABNORMAL HIGH (ref 8–23)
CO2: 27 mmol/L (ref 22–32)
Calcium: 9.4 mg/dL (ref 8.9–10.3)
Chloride: 105 mmol/L (ref 98–111)
Creatinine, Ser: 0.76 mg/dL (ref 0.44–1.00)
GFR, Estimated: >60 mL/min (ref >60)
Glucose, Bld: 106 mg/dL — ABNORMAL HIGH (ref 70–99)
Potassium: 4.2 mmol/L (ref 3.5–5.1)
Sodium: 141 mmol/L (ref 135–145)

## 2022-08-30 MED ORDER — ONDANSETRON HCL 4 MG/2ML IJ SOLN
4.0000 mg | Freq: Once | INTRAMUSCULAR | Status: AC
  Administered 2022-08-30: 4 mg via INTRAVENOUS
  Filled 2022-08-30: qty 2

## 2022-08-30 MED ORDER — OXYCODONE-ACETAMINOPHEN 5-325 MG PO TABS
1.0000 | ORAL_TABLET | Freq: Once | ORAL | Status: AC
  Administered 2022-08-30: 1 via ORAL
  Filled 2022-08-30: qty 1

## 2022-08-30 MED ORDER — LACTATED RINGERS IV SOLN: INTRAVENOUS | Status: DC

## 2022-08-30 MED ORDER — MORPHINE SULFATE (PF) 4 MG/ML IV SOLN
4.0000 mg | Freq: Once | INTRAVENOUS | Status: AC
  Administered 2022-08-30: 4 mg via INTRAVENOUS
  Filled 2022-08-30: qty 1

## 2022-08-30 NOTE — ED Provider Notes (Signed)
[facility] [facility] EMERGENCY DEPARTMENT AT [facility] Provider Note   CSN: [id number] Arrival date & time: 08/30/22  1434     History  Chief Complaint  Patient presents with   Marletta Lor    KIANNAH GRUNOW is a 77 y.o. female.  Patient is a 77 year old female with a history of Parkinson's disease, OCD, anxiety depression and hypertension who is presenting today with complaint of back pain and being unable to walk.  Patient reports that on Thursday she was coming out of the bathroom and fell hitting her back hard on the floor.  She reports she was supposed to be using her rollator but sometimes she did not use it because sometimes she forgets to use it and sometimes she does not think she needs it.  She did not have any loss of consciousness and is not sure what caused her to fall whether it was losing her balance or tripping.  She denies hitting her head but reports since falling she has had a hard time getting up.  Her brother who lives with her has been having to help her get to the bathroom because it is so uncomfortable for her to stand and walk.  She has been taking leftover meloxicam, hydrocodone and acetaminophen at home which she thinks does help some but she came today because the pain was persistent and she was having a hard time getting up and walking.  She also reports not eating or drinking as much because she did not want to pee as often because it was painful to get up.  She denies any abdominal pain, cough or shortness of breath.  No pain in her arms or legs.  Moving her legs does make the pain worse.  Last time she took medication was around 11:00 this morning.  She also reports this is made her depression worse as well.  She denies any SI.  The history is provided by the patient and medical records.  Fall       Home Medications Prior to Admission medications   Medication Sig Start Date End Date Taking? Authorizing Provider  acetaminophen (TYLENOL) 500 MG tablet Take 1 tablet  (500 mg total) by mouth every 6 (six) hours as needed for moderate pain. Hold while on norco Patient taking differently: Take 500 mg by mouth as needed for mild pain or moderate pain. 08/25/20   Lewie Chamber, MD  B Complex Vitamins (B COMPLEX PO) Take 1 tablet by mouth daily.    [provider]  carbidopa-levodopa (SINEMET IR) 25-100 MG tablet Take 1.5 tablets by mouth 3 (three) times daily. 04/26/22   Penumalli, Glenford Bayley, MD  celecoxib (CELEBREX) 200 MG capsule Take 1 capsule (200 mg total) by mouth 2 (two) times daily. 06/02/22   Jacalyn Lefevre, MD  Cholecalciferol (VITAMIN D3 PO) Take 50 mcg by mouth daily.    [provider]  denosumab (PROLIA) 60 MG/ML SOSY injection every 6 (six) months.    [provider]  FLUoxetine (PROZAC) 40 MG capsule Take 40 mg by mouth 2 (two) times daily.    [provider]  HYDROcodone-acetaminophen (NORCO/VICODIN) 5-325 MG tablet Take 1 tablet by mouth every 4 (four) hours as needed. 06/02/22   Jacalyn Lefevre, MD  QUEtiapine (SEROQUEL) 25 MG tablet Take 1.5 tablets (37.5 mg total) by mouth at bedtime. TAKE 1 AND 1/2 TABLETS BY MOUTH AT BEDTIME 04/26/22   Penumalli, Glenford Bayley, MD  vitamin C (ASCORBIC ACID) 500 MG tablet Take 500 mg by mouth  daily.    [provider]      Allergies    Patient has no known allergies.    Review of Systems   Review of Systems  Physical Exam Updated Vital Signs BP (!) 161/85   Pulse 73   Temp 98.2 F (36.8 C) (Oral)   Resp 18   Ht 5' (1.524 m)   Wt 45.4 kg   SpO2 97%   BMI 19.53 kg/m  Physical Exam Vitals and nursing note reviewed.  Constitutional:      General: She is not in acute distress.    Appearance: She is well-developed.  HENT:     Head: Normocephalic and atraumatic.  Eyes:     Pupils: Pupils are equal, round, and reactive to light.  Cardiovascular:     Rate and Rhythm: Normal rate and regular rhythm.     Heart sounds: Normal heart sounds. No murmur heard.    No  friction rub.  Pulmonary:     Effort: Pulmonary effort is normal.     Breath sounds: Normal breath sounds. No wheezing or rales.  Abdominal:     General: Bowel sounds are normal. There is no distension.     Palpations: Abdomen is soft.     Tenderness: There is no abdominal tenderness. There is no guarding or rebound.  Musculoskeletal:     Thoracic back: Tenderness present. Decreased range of motion.     Lumbar back: Tenderness and bony tenderness present. Decreased range of motion.       Back:     Comments: No edema  Skin:    General: Skin is warm and dry.     Findings: No rash.  Neurological:     Mental Status: She is alert and oriented to person, place, and time. Mental status is at baseline.     Cranial Nerves: No cranial nerve deficit.     Sensory: No sensory deficit.     Motor: No weakness.  Psychiatric:        Mood and Affect: Mood is depressed. Affect is flat.        Behavior: Behavior is cooperative.        Thought Content: Thought content does not include suicidal ideation.     ED Results / Procedures / Treatments   Labs (all labs ordered are listed, but only abnormal results are displayed) Labs Reviewed  CBC WITH DIFFERENTIAL/PLATELET - Abnormal; Notable for the following components:      Result Value   Neutro Abs 7.8 (*)    Lymphs Abs 0.5 (*)    All other components within normal limits  BASIC METABOLIC PANEL - Abnormal; Notable for the following components:   Glucose, Bld 106 (*)    BUN 29 (*)    All other components within normal limits  URINALYSIS, W/ REFLEX TO CULTURE (INFECTION SUSPECTED)    EKG None  Radiology CT Lumbar Spine Wo Contrast  Result Date: 08/30/2022 CLINICAL DATA:  Trauma fall EXAM: CT THORACIC AND LUMBAR SPINE WITHOUT CONTRAST TECHNIQUE: Multidetector CT imaging of the thoracic and lumbar spine was performed without contrast. Multiplanar CT image reconstructions were also generated. RADIATION DOSE REDUCTION: This exam was performed  according to the departmental dose-optimization program which includes automated exposure control, adjustment of the mA and/or kV according to patient size and/or use of iterative reconstruction technique. COMPARISON:  MRI 10/09/2018, radiograph 09/01/2018, radiograph 11/24/2016 FINDINGS: CT THORACIC SPINE FINDINGS Alignment: Kyphosis of the lower thoracic spine. Vertebrae: Moderate severe compression fracture of T5 with  about 50% loss of vertebral body height anteriorly. 4 mm retropulsion into the spinal canal but no significant canal stenosis. This is age indeterminate but suspect chronic. Severe compression fracture T7 with about 3 mm retropulsion upper vertebral body, this is chronic. Severe chronic burst fracture T11 with about 7 mm retropulsion. Mild to moderate canal stenosis at this level. Chronic moderate severe superior endplate fracture at T12. Mild chronic superior endplate deformity at T9. Paraspinal and other soft tissues: No paravertebral or paraspinal soft tissue abnormality is seen. Parapelvic cysts versus mild hydronephrosis left kidney. Aortic atherosclerosis. Coronary vascular calcifications. Trace left-sided effusion or pleural thickening. Disc levels: Multilevel degenerative osteophytes. Moderate canal stenosis at T11/T11-T12 secondary to retropulsion from chronic fracture. CT LUMBAR SPINE FINDINGS Segmentation: 5 lumbar type vertebrae. Alignment: Normal. Vertebrae: Acute to subacute appearing diffuse fracture involving the L1 vertebral body without significant loss of vertebral body height. About 5 mm retropulsion of the upper vertebral body into the canal but no significant canal stenosis. Moderate superior endplate deformity at L3 of uncertain acuity but new compared to 2020. Paraspinal and other soft tissues: Mild paravertebral edema at L1. Aortic atherosclerosis Disc levels: At L1-L2, maintained disc space. No canal stenosis. Foramen are patent. At L2-L3, patent disc space. Mild diffuse  disc bulge without canal stenosis. The foramen are patent bilaterally. At L3-L4, patent disc space. No canal stenosis. Facet degenerative changes. The foramen are patent bilaterally. At L4-L5, patent disc space. No canal stenosis. Moderate facet degenerative changes. The foramen are patent. At L5-S1, patent disc space. No canal stenosis. Mild facet degenerative changes. No foraminal narrowing. IMPRESSION: CT THORACIC SPINE IMPRESSION 1. Moderate severe compression fracture of T5 with about 50% loss of vertebral body height. 4 mm retropulsion into the spinal canal but no significant canal stenosis. This is age indeterminate but suspect chronic. 2. Chronic severe compression fracture T7 with about 3 mm retropulsion upper vertebral body. 3. Chronic severe burst fracture T11 with about 7 mm retropulsion. Mild to moderate canal stenosis at this level. 4. Chronic moderate severe superior endplate fracture at T12. Aortic Atherosclerosis (ICD10-I70.0). CT LUMBAR SPINE IMPRESSION *Acute to subacute appearing diffuse fracture involving the L1 vertebral body without significant loss of vertebral body height at this time. About 5 mm retropulsion of the upper vertebral body into the canal but no significant canal stenosis. *Moderate superior endplate deformity at L3 of uncertain acuity but new compared to 2020. *Aortic atherosclerosis. Electronically Signed   By: Jasmine Pang M.D.   On: 08/30/2022 19:17   CT Thoracic Spine Wo Contrast  Result Date: 08/30/2022 CLINICAL DATA:  Trauma fall EXAM: CT THORACIC AND LUMBAR SPINE WITHOUT CONTRAST TECHNIQUE: Multidetector CT imaging of the thoracic and lumbar spine was performed without contrast. Multiplanar CT image reconstructions were also generated. RADIATION DOSE REDUCTION: This exam was performed according to the departmental dose-optimization program which includes automated exposure control, adjustment of the mA and/or kV according to patient size and/or use of iterative  reconstruction technique. COMPARISON:  MRI 10/09/2018, radiograph 09/01/2018, radiograph 11/24/2016 FINDINGS: CT THORACIC SPINE FINDINGS Alignment: Kyphosis of the lower thoracic spine. Vertebrae: Moderate severe compression fracture of T5 with about 50% loss of vertebral body height anteriorly. 4 mm retropulsion into the spinal canal but no significant canal stenosis. This is age indeterminate but suspect chronic. Severe compression fracture T7 with about 3 mm retropulsion upper vertebral body, this is chronic. Severe chronic burst fracture T11 with about 7 mm retropulsion. Mild to moderate canal stenosis at this level. Chronic moderate  severe superior endplate fracture at T12. Mild chronic superior endplate deformity at T9. Paraspinal and other soft tissues: No paravertebral or paraspinal soft tissue abnormality is seen. Parapelvic cysts versus mild hydronephrosis left kidney. Aortic atherosclerosis. Coronary vascular calcifications. Trace left-sided effusion or pleural thickening. Disc levels: Multilevel degenerative osteophytes. Moderate canal stenosis at T11/T11-T12 secondary to retropulsion from chronic fracture. CT LUMBAR SPINE FINDINGS Segmentation: 5 lumbar type vertebrae. Alignment: Normal. Vertebrae: Acute to subacute appearing diffuse fracture involving the L1 vertebral body without significant loss of vertebral body height. About 5 mm retropulsion of the upper vertebral body into the canal but no significant canal stenosis. Moderate superior endplate deformity at L3 of uncertain acuity but new compared to 2020. Paraspinal and other soft tissues: Mild paravertebral edema at L1. Aortic atherosclerosis Disc levels: At L1-L2, maintained disc space. No canal stenosis. Foramen are patent. At L2-L3, patent disc space. Mild diffuse disc bulge without canal stenosis. The foramen are patent bilaterally. At L3-L4, patent disc space. No canal stenosis. Facet degenerative changes. The foramen are patent bilaterally.  At L4-L5, patent disc space. No canal stenosis. Moderate facet degenerative changes. The foramen are patent. At L5-S1, patent disc space. No canal stenosis. Mild facet degenerative changes. No foraminal narrowing. IMPRESSION: CT THORACIC SPINE IMPRESSION 1. Moderate severe compression fracture of T5 with about 50% loss of vertebral body height. 4 mm retropulsion into the spinal canal but no significant canal stenosis. This is age indeterminate but suspect chronic. 2. Chronic severe compression fracture T7 with about 3 mm retropulsion upper vertebral body. 3. Chronic severe burst fracture T11 with about 7 mm retropulsion. Mild to moderate canal stenosis at this level. 4. Chronic moderate severe superior endplate fracture at T12. Aortic Atherosclerosis (ICD10-I70.0). CT LUMBAR SPINE IMPRESSION *Acute to subacute appearing diffuse fracture involving the L1 vertebral body without significant loss of vertebral body height at this time. About 5 mm retropulsion of the upper vertebral body into the canal but no significant canal stenosis. *Moderate superior endplate deformity at L3 of uncertain acuity but new compared to 2020. *Aortic atherosclerosis. Electronically Signed   By: Jasmine Pang M.D.   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 There is new transverse linear fracture in the upper aspect of greater trochanter of left femur. There is 3-4 mm offset in alignment of fracture fragments. There is deformity in the left inferior pubic ramus. There is deformity in the left superior pubic ramus close to the acetabulum. Age of these fractures is not clear. There is old fracture in the right pubic bone with no interval change. IMPRESSION: Previous left hip arthroplasty. There is a new transverse fracture in the upper aspect of greater trochanter of proximal left  femur with 3-4 mm distraction of fracture fragments. New deformities are seen in left inferior pubic ramus and medial left acetabulum without definite radiolucent fracture line. Age of these fractures is indeterminate. These may be recent or old. Old fracture is seen in right pubic bone with no interval change. Electronically Signed   By: Ernie Avena M.D.   On: 08/30/2022 18:38    Procedures Procedures    Medications Ordered in ED Medications  morphine (PF) 4 MG/ML injection 4 mg (has no administration in time range)  ondansetron (ZOFRAN) injection 4 mg (has no administration in time range)  lactated ringers infusion (has no administration in  time range)  oxyCODONE-acetaminophen (PERCOCET/ROXICET) 5-325 MG per tablet 1 tablet (1 tablet Oral Given 08/30/22 1756)    ED Course/ Medical Decision Making/ A&P                             Medical Decision Making Amount and/or Complexity of Data Reviewed External Data Reviewed: notes. Labs: ordered. Decision-making details documented in ED Course. Radiology: ordered and independent interpretation performed. Decision-making details documented in ED Course.  Risk Prescription drug management. Parenteral controlled substances. Decision regarding hospitalization.   Pt with multiple medical problems and comorbidities and presenting today with a complaint that caries a high risk for morbidity and mortality.  Here today with persistent back pain after a fall 4 days ago.  Patient reports having difficulty getting up and walking.  She does live at home but her brother lives with her and does help.  She does not take any anticoagulation.  She has had no nausea vomiting, visual changes or headaches.  All the pain is in her back.  Concern for thoracic or lumbar compression fracture versus possible pelvic fracture as patient has had this in the past.  She has no evidence of neurologic compromise at this time.  Patient is also dealing with anxiety and  depression and does have a flat affect today.  She has been following up with a counselor and a psychiatrist and denies any SI at this time.  Lower suspicion for infectious etiologies as the cause of her fall and feel like is most likely related to her Parkinson's.  Patient had some leftover pain medication at home that she had been taking with mild relief.  Imaging and labs are pending.  Will give patient further pain control.  9:20 PM Minimal improvement with Percocet patient is still having pain.  I have independently visualized and interpreted pt's images today. Pelvic imaging today shows a greater trochanteric fracture but hip replacement appears to be in place.  Radiology reports that there is a new transverse fracture in the upper aspect of the greater trochanter of the proximal left femur with 3 to 4 mm distraction and new deformities are seen in the left inferior pubic ramus and medial left acetabulum without definitive fracture line this could be recent or old.  Spoke with Dr. Constance Goltz with orthopedics and he reported this is nonoperative and patient can weight-bear as tolerated with pain control but should not abduct the left hip.  Also CT of the thoracic and lumbar spine show multiple prior chronic fractures but an acute to subacute appearing diffuse fracture involving the L1 vertebral body without significant height loss and about 5 mm of retropulsion of the upper vertebral body but no significant canal stenosis.  Patient is neurovascularly intact at this time.  Discussed this with Dr. Dutch Quint with neurosurgery and he reported there is no definitive treatment of this but patient could be fitted for a TLSO brace to help with pain control.  Findings discussed with the patient and her family member are sitting at bedside.  Patient is still having pain and now requiring IV pain medication.  She does not feel that she can manage the pain at home and still get up as she is pretty much laid in bed for the last 4  days and has not been eating and drinking because she did not want to get up to go to the bathroom.  Will consult the hospitalist for admission, pain control, PT OT  and will order the TLSO brace.  I independently entered the patient's labs and CBC and BMP within normal limits today.         Final Clinical Impression(s) / ED Diagnoses Final diagnoses:  Closed fracture of first lumbar vertebra, unspecified fracture morphology, initial encounter (HCC)  Displaced fracture of greater trochanter of left femur, initial encounter for closed fracture [facility])    Rx / DC Orders ED Discharge Orders     None         Gwyneth Sprout, MD 08/30/22 2120

## 2022-08-30 NOTE — ED Triage Notes (Signed)
Patient BIB [facility] from Home.  Endorses Fall 4-5 Days ago. States she was exiting the Bathroom and lost her balance (states she was supposed to be using her walker). States she does not believe she hit her head but is unsure. No LOC. No Dizziness. No Anticoagulants. No neck pain.   Pain to Lower/Mid Back.   NAD noted during triage. A&Ox4. GCS 15. BIB Stretcher and transferred to Wheelchair.

## 2022-08-31 ENCOUNTER — Inpatient Hospital Stay (HOSPITAL_COMMUNITY): Payer: Medicare PPO

## 2022-08-31 ENCOUNTER — Ambulatory Visit: Payer: Medicare PPO | Admitting: Professional Counselor

## 2022-08-31 ENCOUNTER — Encounter (HOSPITAL_COMMUNITY): Payer: Self-pay | Admitting: Internal Medicine

## 2022-08-31 DIAGNOSIS — M4856XA Collapsed vertebra, not elsewhere classified, lumbar region, initial encounter for fracture: Secondary | ICD-10-CM | POA: Diagnosis not present

## 2022-08-31 DIAGNOSIS — Z681 Body mass index (BMI) 19 or less, adult: Secondary | ICD-10-CM | POA: Diagnosis not present

## 2022-08-31 DIAGNOSIS — E860 Dehydration: Secondary | ICD-10-CM | POA: Insufficient documentation

## 2022-08-31 DIAGNOSIS — S32592D Other specified fracture of left pubis, subsequent encounter for fracture with routine healing: Secondary | ICD-10-CM | POA: Diagnosis not present

## 2022-08-31 DIAGNOSIS — I10 Essential (primary) hypertension: Secondary | ICD-10-CM | POA: Diagnosis not present

## 2022-08-31 DIAGNOSIS — R451 Restlessness and agitation: Secondary | ICD-10-CM | POA: Diagnosis not present

## 2022-08-31 DIAGNOSIS — R569 Unspecified convulsions: Secondary | ICD-10-CM | POA: Diagnosis not present

## 2022-08-31 DIAGNOSIS — R4182 Altered mental status, unspecified: Secondary | ICD-10-CM | POA: Diagnosis not present

## 2022-08-31 DIAGNOSIS — R636 Underweight: Secondary | ICD-10-CM | POA: Diagnosis present

## 2022-08-31 DIAGNOSIS — Y92009 Unspecified place in unspecified non-institutional (private) residence as the place of occurrence of the external cause: Secondary | ICD-10-CM | POA: Diagnosis not present

## 2022-08-31 DIAGNOSIS — F429 Obsessive-compulsive disorder, unspecified: Secondary | ICD-10-CM | POA: Diagnosis present

## 2022-08-31 DIAGNOSIS — F32A Depression, unspecified: Secondary | ICD-10-CM | POA: Diagnosis not present

## 2022-08-31 DIAGNOSIS — R54 Age-related physical debility: Secondary | ICD-10-CM | POA: Diagnosis not present

## 2022-08-31 DIAGNOSIS — Z79899 Other long term (current) drug therapy: Secondary | ICD-10-CM | POA: Diagnosis not present

## 2022-08-31 DIAGNOSIS — S32010A Wedge compression fracture of first lumbar vertebra, initial encounter for closed fracture: Secondary | ICD-10-CM

## 2022-08-31 DIAGNOSIS — H5702 Anisocoria: Secondary | ICD-10-CM | POA: Diagnosis present

## 2022-08-31 DIAGNOSIS — N1330 Unspecified hydronephrosis: Secondary | ICD-10-CM | POA: Diagnosis not present

## 2022-08-31 DIAGNOSIS — Z8249 Family history of ischemic heart disease and other diseases of the circulatory system: Secondary | ICD-10-CM | POA: Diagnosis not present

## 2022-08-31 DIAGNOSIS — E7800 Pure hypercholesterolemia, unspecified: Secondary | ICD-10-CM | POA: Diagnosis present

## 2022-08-31 DIAGNOSIS — G20A1 Parkinson's disease without dyskinesia, without mention of fluctuations: Secondary | ICD-10-CM | POA: Diagnosis not present

## 2022-08-31 DIAGNOSIS — F332 Major depressive disorder, recurrent severe without psychotic features: Secondary | ICD-10-CM | POA: Diagnosis not present

## 2022-08-31 DIAGNOSIS — Z96642 Presence of left artificial hip joint: Secondary | ICD-10-CM | POA: Diagnosis present

## 2022-08-31 DIAGNOSIS — W010XXD Fall on same level from slipping, tripping and stumbling without subsequent striking against object, subsequent encounter: Secondary | ICD-10-CM | POA: Diagnosis present

## 2022-08-31 DIAGNOSIS — Z825 Family history of asthma and other chronic lower respiratory diseases: Secondary | ICD-10-CM | POA: Diagnosis not present

## 2022-08-31 DIAGNOSIS — N2889 Other specified disorders of kidney and ureter: Secondary | ICD-10-CM | POA: Diagnosis present

## 2022-08-31 DIAGNOSIS — S72112A Displaced fracture of greater trochanter of left femur, initial encounter for closed fracture: Secondary | ICD-10-CM | POA: Diagnosis not present

## 2022-08-31 DIAGNOSIS — R339 Retention of urine, unspecified: Secondary | ICD-10-CM | POA: Diagnosis not present

## 2022-08-31 DIAGNOSIS — Z791 Long term (current) use of non-steroidal anti-inflammatories (NSAID): Secondary | ICD-10-CM | POA: Diagnosis not present

## 2022-08-31 DIAGNOSIS — Z820 Family history of epilepsy and other diseases of the nervous system: Secondary | ICD-10-CM | POA: Diagnosis not present

## 2022-08-31 LAB — URINALYSIS, W/ REFLEX TO CULTURE (INFECTION SUSPECTED)
Bacteria, UA: NONE SEEN
Bilirubin Urine: NEGATIVE
Glucose, UA: NEGATIVE mg/dL
Ketones, ur: 40 mg/dL — AB
Leukocytes,Ua: NEGATIVE
Nitrite: NEGATIVE
Protein, ur: 30 mg/dL — AB
Specific Gravity, Urine: 1.039 — ABNORMAL HIGH (ref 1.005–1.030)
pH: 5.5 (ref 5.0–8.0)

## 2022-08-31 LAB — GLUCOSE, CAPILLARY: Glucose-Capillary: 103 mg/dL — ABNORMAL HIGH (ref 70–99)

## 2022-08-31 MED ORDER — IOHEXOL 350 MG/ML SOLN
75.0000 mL | Freq: Once | INTRAVENOUS | Status: AC | PRN
  Administered 2022-08-31: 75 mL via INTRAVENOUS

## 2022-08-31 MED ORDER — SODIUM CHLORIDE 0.9 % IV SOLN: INTRAVENOUS | Status: DC

## 2022-08-31 MED ORDER — FLUOXETINE HCL 20 MG PO CAPS
40.0000 mg | ORAL_CAPSULE | Freq: Two times a day (BID) | ORAL | Status: DC
  Administered 2022-08-31 – 2022-09-03 (×7): 40 mg via ORAL
  Filled 2022-08-31 (×7): qty 2

## 2022-08-31 MED ORDER — MORPHINE SULFATE (PF) 4 MG/ML IV SOLN
4.0000 mg | Freq: Once | INTRAVENOUS | Status: AC
  Administered 2022-08-31: 4 mg via INTRAVENOUS
  Filled 2022-08-31: qty 1

## 2022-08-31 MED ORDER — CARBIDOPA-LEVODOPA 25-100 MG PO TABS
1.5000 | ORAL_TABLET | Freq: Three times a day (TID) | ORAL | Status: DC
  Administered 2022-08-31 – 2022-09-03 (×11): 1.5 via ORAL
  Filled 2022-08-31 (×11): qty 2

## 2022-08-31 MED ORDER — MORPHINE SULFATE (PF) 2 MG/ML IV SOLN: 4.0000 mg | INTRAVENOUS | Status: DC | PRN

## 2022-08-31 MED ORDER — OXYCODONE HCL 5 MG PO TABS
5.0000 mg | ORAL_TABLET | ORAL | Status: DC | PRN
  Administered 2022-08-31 – 2022-09-03 (×4): 5 mg via ORAL
  Filled 2022-08-31 (×4): qty 1

## 2022-08-31 MED ORDER — QUETIAPINE FUMARATE 25 MG PO TABS
37.5000 mg | ORAL_TABLET | Freq: Every day | ORAL | Status: DC
  Administered 2022-09-01 – 2022-09-02 (×3): 37.5 mg via ORAL
  Filled 2022-08-31 (×4): qty 2

## 2022-08-31 MED ORDER — ACETAMINOPHEN 500 MG PO TABS
1000.0000 mg | ORAL_TABLET | Freq: Three times a day (TID) | ORAL | Status: DC
  Administered 2022-08-31 – 2022-09-03 (×9): 1000 mg via ORAL
  Filled 2022-08-31 (×10): qty 2

## 2022-08-31 MED ORDER — ONDANSETRON HCL 4 MG/2ML IJ SOLN: 4.0000 mg | Freq: Four times a day (QID) | INTRAMUSCULAR | Status: DC | PRN

## 2022-08-31 MED ORDER — ENOXAPARIN SODIUM 30 MG/0.3ML IJ SOSY
30.0000 mg | PREFILLED_SYRINGE | INTRAMUSCULAR | Status: DC
  Administered 2022-08-31 – 2022-09-03 (×4): 30 mg via SUBCUTANEOUS
  Filled 2022-08-31 (×4): qty 0.3

## 2022-08-31 MED ORDER — ONDANSETRON HCL 4 MG PO TABS: 4.0000 mg | ORAL_TABLET | Freq: Four times a day (QID) | ORAL | Status: DC | PRN

## 2022-08-31 MED ORDER — METHOCARBAMOL 500 MG PO TABS
500.0000 mg | ORAL_TABLET | Freq: Three times a day (TID) | ORAL | Status: DC | PRN
  Administered 2022-09-02: 500 mg via ORAL
  Filled 2022-08-31: qty 1

## 2022-08-31 NOTE — Hospital Course (Signed)
Patricia Fitzpatrick is a 77 y.o. F with Parkinson's disease, HTN who presented few days ago with mechanical fall, found to have pubic ramus fracture, discharged home from the ED.  After 2 days, failed home management, couldn't stand, walk or toilet, so returned to the hospital.  Found to have dehydration, now evident on imaging was also an L1 compression fracture and a greater trochanter fracture.  EDP discussed with ortho and NSGY, both non-operative.  Patient admitted for IV fluids, pain control and PT eval.

## 2022-08-31 NOTE — Progress Notes (Signed)
Patient ID: Patricia Fitzpatrick, female   DOB: [birth year], 77 y.o.   MRN: [id number]   I was contacted regarding the recent injuries that [name] sustained to her left hip.  Review of her operative history indicates history of left hip hemiarthroplasty performed in 2022 by Dr. Ramond Marrow.  After speaking with the emergency room physician I recommended that she could be weightbearing as tolerated with no active abduction.  If needed the primary team at this point should contact Delbert Harness regarding any more specifics during her hospital stay as well as for a follow-up appointment which likely would be within 2 to 4 weeks with Dr. [name] or one of his colleagues.

## 2022-08-31 NOTE — Assessment & Plan Note (Signed)
GIven IV fluids overnight - Repeat BMP

## 2022-08-31 NOTE — Consult Note (Signed)
Neurology Consultation Reason for Consult: Code stroke Requesting Physician: Ival Bible  CC: Agitation, pupil anisocoria  History is obtained from: Patient, nurse at bedside, chart review   HPI: Patricia Fitzpatrick is a 77 y.o. female admitted on 08/30/2022 with L1 fracture and pelvic fracture being managed non-operatively   Family reported she felt like no one had been in to see her (didn't remember their visit), was banging her cellphone on the table because she wanted someone to pick up her dinner tray (more trivial than what she typically would get upset about), was feeling neglected and wanted someone to stay with her.   BP remained elevated even after she calmed down (197/89). Denies headache, feeling overall back to her normal self at the time of my examination though she notes multiple chronic issues and hesitation with movements due to pain from her fractures.   On review of records, pupils have been documented as equal round and reactive in all prior notes, even neurology notes   She does have prior history of hospital delirium during her 08/2020 hospitalization and mild memory loss (though intact on brief neurological examinations) noted in her outpatient neurology notes.    LKW: 9 PM Thrombolytic given?: No, recent fall with pelvic fracture and L1 lumbar fracture IA performed?: No, exam not consistent with LVO Premorbid modified rankin scale:      3 - Moderate disability. Requires some help, but able to walk unassisted (prior to fractures used rollator as needed, fall was w/o DME per PT/OT notes)  ROS: No other acute complaints   Past Medical History:  Diagnosis Date   Anxiety    Cataracts, bilateral    Depression    High cholesterol    Hypertension    OCD (obsessive compulsive disorder)    Parkinsonism 10/14/2015   Tremor    Past Surgical History:  Procedure Laterality Date   BREAST BIOPSY  1981   CATARACT EXTRACTION Right 2017   EYE SURGERY  1952   HIP ARTHROPLASTY  Left 08/23/2020   Procedure: ARTHROPLASTY BIPOLAR HIP (HEMIARTHROPLASTY);  Surgeon: Bjorn Pippin, MD;  Location: [facility];  Service: Orthopedics;  Laterality: Left;   Current Outpatient Medications  Medication Instructions   acetaminophen (TYLENOL) 500 mg, Oral, Every 6 hours PRN, Hold while on norco   ascorbic acid (VITAMIN C) 500 mg, Oral, Daily   B Complex Vitamins (B COMPLEX PO) 1 tablet, Oral, Daily   carbidopa-levodopa (SINEMET IR) 25-100 MG tablet 1.5 tablets, Oral, 3 times daily   Cholecalciferol (VITAMIN D3 PO) 50 mcg, Oral, Daily   FLUoxetine (PROZAC) 40 mg, Oral, 2 times daily   HYDROcodone-acetaminophen (NORCO/VICODIN) 5-325 MG tablet 1 tablet, Oral, Every 4 hours PRN   MELOXICAM PO 1 tablet, Oral, Daily PRN   Prolia 60 mg, Subcutaneous, Every 6 months   QUEtiapine (SEROQUEL) 37.5 mg, Oral, Daily at bedtime, TAKE 1 AND 1/2 TABLETS BY MOUTH AT BEDTIME    Current Facility-Administered Medications:    0.9 %  sodium chloride infusion, , Intravenous, Continuous, Danford, Earl Lites, MD   acetaminophen (TYLENOL) tablet 1,000 mg, 1,000 mg, Oral, TID, Danford, Earl Lites, MD, 1,000 mg at 08/31/22 1718   carbidopa-levodopa (SINEMET IR) 25-100 MG per tablet immediate release 1.5 tablet, 1.5 tablet, Oral, TID WC, Danford, Earl Lites, MD, 1.5 tablet at 08/31/22 1718   enoxaparin (LOVENOX) injection 30 mg, 30 mg, Subcutaneous, Q24H, Danford, Earl Lites, MD, 30 mg at 08/31/22 1010   FLUoxetine (PROZAC) capsule 40 mg, 40 mg, Oral, BID, Danford,  Earl Lites, MD, 40 mg at 08/31/22 1010   methocarbamol (ROBAXIN) tablet 500 mg, 500 mg, Oral, Q8H PRN, Danford, Earl Lites, MD   morphine (PF) 2 MG/ML injection 4 mg, 4 mg, Intravenous, Q4H PRN, Danford, Earl Lites, MD   ondansetron (ZOFRAN) tablet 4 mg, 4 mg, Oral, Q6H PRN **OR** ondansetron (ZOFRAN) injection 4 mg, 4 mg, Intravenous, Q6H PRN, Danford, Earl Lites, MD   oxyCODONE (Oxy IR/ROXICODONE) immediate release tablet 5 mg, 5  mg, Oral, Q4H PRN, Danford, Earl Lites, MD, 5 mg at 08/31/22 1026   QUEtiapine (SEROQUEL) tablet 37.5 mg, 37.5 mg, Oral, QHS, Danford, Earl Lites, MD   Family History  Problem Relation Age of Onset   Pulmonary disease Mother    Alzheimer's disease Mother    Heart disease Father    Alzheimer's disease Father    Parkinson's disease Neg Hx     Social History:  reports that she has never smoked. She has never used smokeless tobacco. She reports that she does not currently use alcohol. She reports that she does not use drugs.   Exam: Current vital signs: BP (!) 197/89   Pulse 70   Temp 98 F (36.7 C) (Oral)   Resp 18   Ht 5\' 2"  (1.575 m)   Wt 44 kg   SpO2 100%   BMI 17.74 kg/m  Vital signs in last 24 hours: Temp:  [98 F (36.7 C)-98.6 F (37 C)] 98 F (36.7 C) (07/16 2005) Pulse Rate:  [57-77] 70 (07/16 2151) Resp:  [14-20] 18 (07/16 2005) BP: (108-199)/(59-96) 197/89 (07/16 2151) SpO2:  [94 %-100 %] 100 % (07/16 2005) Weight:  [44 kg] 44 kg (07/16 0429)   Physical Exam  Constitutional: Appears thin but in no acute distress Psych: Calm and cooperative, at times slightly tangential but redirectable Eyes: No scleral injection HENT: No oropharyngeal obstruction.  MSK: no major joint deformities.  Cardiovascular: Perfusing extremities well Respiratory: Effort normal, non-labored breathing GI: Soft.  No distension. There is no tenderness.   Neuro: Mental Status: Patient is awake, alert, oriented to person, place, month, year, and situation. Patient is able to give a clear and coherent history. No signs of aphasia or neglect Cranial Nerves: II: Visual Fields are full. Left pupil 5 mm to 3 mm, right pupil 3 mm to 1 mm III,IV, VI: EOMI without ptosis or diploplia.  V: Facial sensation is symmetric to temperature VII: Facial movement is symmetric.  VIII: hearing is intact to voice X: Uvula elevates symmetrically XI: Shoulder shrug is symmetric. XII: tongue is  midline without atrophy or fasciculations.  Motor: No pronator drift. Able to maintain both lower extremities antigravity without drift for at least 5 seconds. Confrontational testing deferred due to fractures and pain Sensory: Sensation is symmetric to light touch and temperature in the arms and legs. Cerebellar: FNF and HKS are intact bilaterally Gait:  Deferred   NIHSS total 0    I have reviewed labs in epic and the results pertinent to this consultation are:  Basic Metabolic Panel: Recent Labs  Lab 08/30/22 1803  NA 141  K 4.2  CL 105  CO2 27  GLUCOSE 106*  BUN 29*  CREATININE 0.76  CALCIUM 9.4    CBC: Recent Labs  Lab 08/30/22 1803  WBC 9.1  NEUTROABS 7.8*  HGB 14.5  HCT 44.3  MCV 94.9  PLT 244    Coagulation Studies: No results for input(s): "LABPROT", "INR" in the last 72 hours.   No results found for: "  HGBA1C"   No results found for: "CHOL", "HDL", "LDLCALC", "LDLDIRECT", "TRIG", "CHOLHDL"  I have reviewed the images obtained:  Head CT personally reviewed, agree with radiology no acute intracranial process CTA personally reviewed, agree with radiology:   1. No emergent large vessel occlusion or hemodynamically significant stenosis of the head or neck. 2. Mild bilateral carotid bifurcation atherosclerosis without hemodynamically significant stenosis. 3. No aneurysm.   Impression: History as described most consistent with delirium. Suspect pupillary change is physiological anisocoria given no change in vision and both pupils are still reactive without any headache or other clear symptoms. However, prudent to rule out aneurysm; rarely seizures can cause anisocoria as well so will obtain EEG to screen for this. Right MCA strokes can be subtle with personality change as the predominant feature so MRI is also indicated to rule out stroke definitively.   Recommendations: - Head CT to rule out acute bleed - CTA to rule out aneurysmal compression of the  left CN III  - MRI brain to more sensitively evaluate for acute intracranial process - Addendum, MRI is negative, goal normotension, no need for further stroke workup as low overall concern that event represents stroke/TIA - EEG  - Delirium precautions - Neurology will follow-up EEG but otherwise will sign off. Please don't hesitate to reach out if new questions/concerns arise   Brooke Dare MD-PhD [facility] [phone] Available 7 PM to 7 AM, outside of these hours please call Neurologist on call as listed on Amion.   Total critical care time: 35 minutes   Critical care time was exclusive of separately billable procedures and treating other patients.   Critical care was necessary to treat or prevent imminent or life-threatening deterioration.   Critical care was time spent personally by me on the following activities: development of treatment plan with patient and/or surrogate as well as nursing, discussions with consultants/primary team, evaluation of patient's response to treatment, examination of patient, obtaining history from patient or surrogate, ordering and performing treatments and interventions, ordering and review of laboratory studies, ordering and review of radiographic studies, and re-evaluation of patient's condition as needed, as documented above.

## 2022-08-31 NOTE — Assessment & Plan Note (Signed)
Nonoperative.  Failed outpatient management.  Requiring multiple doses of IV opiates overnight. - PT/OT - TOC consult

## 2022-08-31 NOTE — Assessment & Plan Note (Signed)
BP elevated due to pain. Not on home meds.

## 2022-08-31 NOTE — Progress Notes (Signed)
Orthopedic Tech Progress Note Patient Details:  Patricia Fitzpatrick Jan 27, 1946 [id number]  Dropped off TLSO to pt bedside. Fitted TLSO to pt. Ortho Devices Type of Ortho Device: Thoracolumbar corset (TLSO) Ortho Device/Splint Interventions: Ordered   Post Interventions Patient Tolerated: Well Instructions Provided: Care of device, Adjustment of device  Sherilyn Banker 08/31/2022, 6:52 AM

## 2022-08-31 NOTE — Assessment & Plan Note (Signed)
-   Continue Sinemet, mirapex

## 2022-08-31 NOTE — Code Documentation (Signed)
Stroke Response Nurse Documentation Code Documentation  Patricia Fitzpatrick is a 77 y.o. female admitted 7/15 after a fall and suffered L1 fx, closed left femur fx and pubic ramus fx. LSW 2100 when code stroke was activated for AMS and unequal pupils.   Stroke team at the bedside on patient arrival. Patient to CT with team. NIHSS 0, see documentation for details and code stroke times. Patient with no focal deficits on exam. The following imaging was completed:  CT Head and CTA. Patient is not a candidate for IV Thrombolytic due to fractures/fall. Patient is not a candidate for IR due to low suspicion for LVO.   Care Plan: Neuro checks q2.   Bedside handoff with RN Ashleigh.    Rose Fillers  Rapid Response RN

## 2022-08-31 NOTE — Evaluation (Signed)
Occupational Therapy Evaluation Patient Details Name: Patricia Fitzpatrick MRN: [id number] DOB: October 18, 1945 Today's Date: 08/31/2022   History of Present Illness 77 y.o. female presents to [facility] hospital on 08/30/2022. Pt fell recently and was diagnosed with pubic ramus fx and sent home. After discharge pt had been unable to stand or walk. New imaging noted L1 compression fx and L femur greater trochanter fx. PMH includes anxiety, depression, HLD, HTN, parkinsonism.   Clinical Impression   Patricia Fitzpatrick was evaluated s/p the above admission list. She lives with family and is mod I at baseline, she also endorses several falls. Upon evaluation the pt was limited by generalized weakness, poor balance with posterior bias in sitting and standing, impaired cognition, knowledge for precautions and limited activity tolerance. Overall she needed up to max A for bed mobility with cues to maintain precautions and min A for STS transfers, and min G for short mobility with a RW. Due to the deficits listed below the pt also needs up to max A for LB ADLs and min A for UB ADLs. Throughout session, she benefited form maximal cues for task initiation, sequencing, attention and safety due to verbosity and self-distracting thought. Pt will benefit from continued acute OT services and skilled inpatient follow up therapy, <3 hours/day.       Recommendations for follow up therapy are one component of a multi-disciplinary discharge planning process, led by the attending physician.  Recommendations may be updated based on patient status, additional functional criteria and insurance authorization.   Assistance Recommended at Discharge Frequent or constant Supervision/Assistance  Patient can return home with the following A lot of help with walking and/or transfers;A lot of help with bathing/dressing/bathroom;Assistance with cooking/housework;Assistance with feeding;Direct supervision/assist for medications management;Direct supervision/assist for  financial management;Assist for transportation;Help with stairs or ramp for entrance    Functional Status Assessment  Patient has had a recent decline in their functional status and demonstrates the ability to make significant improvements in function in a reasonable and predictable amount of time.  Equipment Recommendations  Other (comment) (defer)       Precautions / Restrictions Precautions Precautions: Fall;Back;Other (comment) Precaution Booklet Issued: Yes (comment) Precaution Comments: no active L hip abduction Required Braces or Orthoses: Spinal Brace Spinal Brace: Thoracolumbosacral orthotic;Applied in sitting position Restrictions Weight Bearing Restrictions: Yes LLE Weight Bearing: Weight bearing as tolerated      Mobility Bed Mobility Overal bed mobility: Needs Assistance Bed Mobility: Rolling, Sidelying to Sit, Sit to Sidelying Rolling: Mod assist Sidelying to sit: Mod assist     Sit to sidelying: Max assist General bed mobility comments: cues for log roll    Transfers Overall transfer level: Needs assistance Equipment used: Rolling walker (2 wheels) Transfers: Sit to/from Stand Sit to Stand: Min assist, +2 physical assistance, +2 safety/equipment           General transfer comment: progressed to min G for short distance mobility with RW      Balance Overall balance assessment: Needs assistance Sitting-balance support: Feet supported, Bilateral upper extremity supported Sitting balance-Leahy Scale: Poor Sitting balance - Comments: posterior lean, minG-minA Postural control: Posterior lean Standing balance support: Bilateral upper extremity supported Standing balance-Leahy Scale: Poor                             ADL either performed or assessed with clinical judgement   ADL Overall ADL's : Needs assistance/impaired Eating/Feeding: Set up;Sitting   Grooming: Set up;Sitting  Upper Body Bathing: Set up;Standing   Lower Body  Bathing: Maximal assistance;Sit to/from stand   Upper Body Dressing : Minimal assistance;Sitting   Lower Body Dressing: Total assistance;Sit to/from stand Lower Body Dressing Details (indicate cue type and reason): total A for socks this date Toilet Transfer: Minimal assistance;+2 for safety/equipment;Ambulation;BSC/3in1   Toileting- Clothing Manipulation and Hygiene: Maximal assistance;Sit to/from stand       Functional mobility during ADLs: Minimal assistance;+2 for physical assistance;+2 for safety/equipment General ADL Comments: limited by weakness, cognition, baseline PD, posterior bias     Vision Baseline Vision/History: 1 Wears glasses Vision Assessment?: No apparent visual deficits     Perception Perception Perception Tested?: No   Praxis Praxis Praxis tested?: Not tested    Pertinent Vitals/Pain Pain Assessment Pain Assessment: Faces Faces Pain Scale: Hurts a little bit Pain Location: mild back pain after mobility Pain Descriptors / Indicators: Discomfort Pain Intervention(s): Limited activity within patient's tolerance, Monitored during session     Hand Dominance     Extremity/Trunk Assessment Upper Extremity Assessment Upper Extremity Assessment: Generalized weakness   Lower Extremity Assessment Lower Extremity Assessment: Defer to PT evaluation RLE Deficits / Details: BLE tremors LLE Deficits / Details: BLE tremors   Cervical / Trunk Assessment Cervical / Trunk Assessment: Other exceptions Cervical / Trunk Exceptions: L1 compression fx   Communication Communication Communication: No difficulties (verbose)   Cognition Arousal/Alertness: Awake/alert Behavior During Therapy: Anxious, Flat affect Overall Cognitive Status: Impaired/Different from baseline Area of Impairment: Attention, Memory, Following commands, Safety/judgement, Problem solving, Awareness, Orientation                 Orientation Level: Time Current Attention Level:  Sustained Memory: Decreased short-term memory, Decreased recall of precautions Following Commands: Follows one step commands with increased time Safety/Judgement: Decreased awareness of safety, Decreased awareness of deficits Awareness: Emergent Problem Solving: Slow processing, Decreased initiation, Requires verbal cues General Comments: Pt self reports that her cognition is off, she describes herself as "sharp" at baseline. Overall she followed most 1 step commands with cues to attention and re-direction as pt is verbose with self distracting thought.     General Comments  VSS on Ra    Exercises     Shoulder Instructions      Home Living Family/patient expects to be discharged to:: Private residence Living Arrangements: Children;Other relatives Available Help at Discharge: Family;Available PRN/intermittently Type of Home: House Home Access: Stairs to enter [employer] of Steps: 3 Entrance Stairs-Rails: Can reach both Home Layout: Two level;Able to live on main level with bedroom/bathroom     Bathroom Shower/Tub: Chief Strategy Officer: Standard     Home Equipment: Agricultural consultant (2 wheels);Rollator (4 wheels);Cane - quad          Prior Functioning/Environment Prior Level of Function : Independent/Modified Independent             Mobility Comments: history of falls, reports utilizing rollator PRN recently. this fall was without DME ADLs Comments: pt reports performing ADLs independently typically        OT Problem List: Decreased strength;Decreased range of motion;Decreased activity tolerance;Impaired balance (sitting and/or standing);Decreased safety awareness;Decreased knowledge of use of DME or AE;Decreased knowledge of precautions      OT Treatment/Interventions: Self-care/ADL training;Therapeutic exercise;DME and/or AE instruction;Therapeutic activities;Patient/family education;Balance training    OT Goals(Current goals can be found  in the care plan section) Acute Rehab OT Goals Patient Stated Goal: to get better OT Goal Formulation: With patient Time For Goal  Achievement: 09/14/22 Potential to Achieve Goals: Good ADL Goals Pt Will Perform Upper Body Dressing: with set-up;sitting Pt Will Perform Lower Body Dressing: with min assist;sit to/from stand Pt Will Transfer to Toilet: with supervision;ambulating Additional ADL Goal #1: pt will complete bed mobility with supervision A as a precursor to ADLs  OT Frequency: Min 2X/week    Co-evaluation PT/OT/SLP Co-Evaluation/Treatment: Yes Reason for Co-Treatment: Complexity of the patient's impairments (multi-system involvement);For patient/therapist safety;To address functional/ADL transfers   OT goals addressed during session: ADL's and self-care      AM-PAC OT "6 Clicks" Daily Activity     Outcome Measure Help from another person eating meals?: A Little Help from another person taking care of personal grooming?: A Little Help from another person toileting, which includes using toliet, bedpan, or urinal?: A Lot Help from another person bathing (including washing, rinsing, drying)?: A Lot Help from another person to put on and taking off regular upper body clothing?: A Little Help from another person to put on and taking off regular lower body clothing?: A Lot 6 Click Score: 15   End of Session Equipment Utilized During Treatment: Rolling walker (2 wheels);Back brace Nurse Communication: Mobility status  Activity Tolerance: Patient tolerated treatment well Patient left: in bed;with call bell/phone within reach;with bed alarm set  OT Visit Diagnosis: Other abnormalities of gait and mobility (R26.89);Unsteadiness on feet (R26.81);Repeated falls (R29.6);Muscle weakness (generalized) (M62.81);History of falling (Z91.81)                Time: 6295-2841 OT Time Calculation (min): 34 min Charges:  OT General Charges $OT Visit: 1 Visit OT Evaluation $OT Eval Moderate  Complexity: 1 Mod  Derenda Mis, OTR/L Acute Rehabilitation Services Office [phone] Secure Chat Communication Preferred   Donia Pounds 08/31/2022, 5:00 PM

## 2022-08-31 NOTE — Progress Notes (Signed)
Hospitalist Transfer Note:  Transferring facility: [facility] Requesting provider: Dr. Anitra Lauth (EDP at [facility]) Reason for transfer: admission for further evaluation and management of acute fracture of greater trochanter of left femur as well as acute fracture of L1 vertebral body after ground-level mechanical fall.   77 year old female with history of Parkinson's disease, hemiarthroplasty of the left hip,  who presented to [facility] ED complaining of new onset low back pain as well as left hip discomfort following ground-level mechanical fall that occurred on Thursday, 08/26/2022 when she slipped while in the bathroom.  She notes that she has had diminished ambulatory activity since that time due to poor pain control at home in spite of use of previously prescribed hydrocodone and meloxicam.  In the setting of her diminished mobility over the last few days, she notes a decline in oral intake.  She lives with her brother at home, but brother conveys that he is unable to provide the level of care necessary for the patient at this time in the setting of her acute injuries.  Ensuing imaging notable for greater trochanteric fracture of the left femur as well as L1 vertebral body fracture.  EDP discussed the fracture of the greater trochanter of the left femur with on-call orthopedic surgery, Dr. [name], Who recommended pain control, weightbearing as tolerated, and recommended refraining from abducting the left lower extremity; Dr. Nilsa Nutting group will not be formally consulting, but rather recommended follow-up with Dr. Austin Miles group as it was Dr. [name] would performed pt's left hip hemiarthroplasty; additionally, EDP L1 fx with on-call neurosurgery, Dr. Dutch Quint, who conveyed that this is no an indication for surgical intervention relating to the patient's L1 fx, but rather recommended TSLO.    Subsequently, I accepted this patient for transfer for inpatient admission to a med-surg bed at [facility] or [facility] (first available) for further  work-up and management of the above.      Nursing staff, Please call [facility] Admits & Consults System-Wide number on Amion [phone]) as soon as patient's arrival, so appropriate admitting provider can evaluate the pt.     Newton Pigg, DO Hospitalist

## 2022-08-31 NOTE — H&P (Signed)
History and Physical    Patient: Patricia Fitzpatrick MWU:[id number] DOB: 1945-05-03 DOA: 08/30/2022 DOS: the patient was seen and examined on 08/31/2022 PCP: [name], MD  Patient coming from: Home  Chief Complaint:  Chief Complaint  Patient presents with   Fall       HPI:  Patricia Fitzpatrick is a 77 y.o. F with Parkinson's disease, HTN who presented few days ago with mechanical fall, found to have pubic ramus fracture, discharged home from the ED.  After 2 days, failed home management, couldn't stand, walk or toilet, so returned to the hospital.  Found to have dehydration, now evident on imaging was also an L1 compression fracture and a greater trochanter fracture.  EDP discussed with ortho and NSGY, both non-operative.  Patient admitted for IV fluids, pain control and PT eval.      Review of Systems  Constitutional:  Negative for chills and fever.  Musculoskeletal:  Positive for back pain, falls and joint pain.  Neurological:  Negative for dizziness, focal weakness and weakness.  Psychiatric/Behavioral:  The patient is nervous/anxious.   All other systems reviewed and are negative.    Past Medical History:  Diagnosis Date   Anxiety    Cataracts, bilateral    Depression    High cholesterol    Hypertension    OCD (obsessive compulsive disorder)    Parkinsonism 10/14/2015   Tremor    Past Surgical History:  Procedure Laterality Date   BREAST BIOPSY  1981   CATARACT EXTRACTION Right 2017   EYE SURGERY  1952   HIP ARTHROPLASTY Left 08/23/2020   Procedure: ARTHROPLASTY BIPOLAR HIP (HEMIARTHROPLASTY);  Surgeon: Bjorn Pippin, MD;  Location: [facility];  Service: Orthopedics;  Laterality: Left;   Social History:  reports that she has never smoked. She has never used smokeless tobacco. She reports that she does not currently use alcohol. She reports that she does not use drugs.  No Known Allergies  Family History  Problem Relation Age of Onset   Pulmonary disease Mother    Alzheimer's  disease Mother    Heart disease Father    Alzheimer's disease Father    Parkinson's disease Neg Hx     Prior to Admission medications   Medication Sig Start Date End Date Taking? Authorizing Provider  acetaminophen (TYLENOL) 500 MG tablet Take 1 tablet (500 mg total) by mouth every 6 (six) hours as needed for moderate pain. Hold while on norco Patient taking differently: Take 500 mg by mouth as needed for mild pain or moderate pain. 08/25/20   Lewie Chamber, MD  B Complex Vitamins (B COMPLEX PO) Take 1 tablet by mouth daily.    [provider]  carbidopa-levodopa (SINEMET IR) 25-100 MG tablet Take 1.5 tablets by mouth 3 (three) times daily. 04/26/22   Penumalli, Glenford Bayley, MD  celecoxib (CELEBREX) 200 MG capsule Take 1 capsule (200 mg total) by mouth 2 (two) times daily. 06/02/22   Jacalyn Lefevre, MD  Cholecalciferol (VITAMIN D3 PO) Take 50 mcg by mouth daily.    [provider]  denosumab (PROLIA) 60 MG/ML SOSY injection every 6 (six) months.    [provider]  FLUoxetine (PROZAC) 40 MG capsule Take 40 mg by mouth 2 (two) times daily.    [provider]  HYDROcodone-acetaminophen (NORCO/VICODIN) 5-325 MG tablet Take 1 tablet by mouth every 4 (four) hours as needed. 06/02/22   Jacalyn Lefevre, MD  QUEtiapine (SEROQUEL) 25 MG tablet Take 1.5 tablets (37.5 mg total) by mouth  at bedtime. TAKE 1 AND 1/2 TABLETS BY MOUTH AT BEDTIME 04/26/22   Penumalli, Glenford Bayley, MD  vitamin C (ASCORBIC ACID) 500 MG tablet Take 500 mg by mouth daily.    [provider]    Physical Exam: Vitals:   08/31/22 0245 08/31/22 0429 08/31/22 0429 08/31/22 0733  BP:   (!) 169/81 (!) 150/85  Pulse: 65  67 70  Resp:   18 20  Temp:   98.3 F (36.8 C) 98.3 F (36.8 C)  TempSrc:   Oral Oral  SpO2: 96%  96% 96%  Weight:  44 kg    Height:  5\' 2"  (1.575 m)     General appearance: Frail adult female, lying in bed alert and in no distress, but quite anxious and slightly confused  from morphine.  Responds appropriately to questions.  Eye contact, dress and hygiene appropriate. HEENT:  Anicteric, conjunctivae and sclerae normal without injection or icterus, lids and lashes normal.  Visual tracking smooth.  OP moist without lesions, dentition normal, lips normal, normal auditory acuity   Cor: Tachycardic, regular, without murmurs, rubs.  JVP normal, no LE edema Resp:  Normal respiratory rate and rhythm.  CTAB without rales or wheezes. Abd:  No TTP or rebound all quadrants.  No masses or organomegaly.   No ascites, distension.  MSK: Symmetrical without gross deformities of the hands, large joints, or legs. Skin:  cap refill normal, Skin intact without significant rashes or lesions. Neuro:  Speech is fluent.  Naming is grossly intact, patient's recall, both recent and remote, seem within normal limits.  Muscle tone decreased, right arm weak, I suspect this is from effort, left arm still weak but better than right, bilateral leg pain limits examination, overall examination severely limited by effort, face symmetric  Psych:  Attention span and concentration are within normal limits.  Affect anxious and frustrated.  appropriate thought content and normal rate of speech, thought process tangential         Data Reviewed: Basic metabolic panel shows elevated BUN/creatinine ratio Urinalysis unremarkable CBC normal Radiographs show left inferior pubic ramus fracture, left greater trochanter fracture, and L1 compression fracture as well as multiple old compression fractures of the spine    Assessment and Plan: * Closed compression fracture of L1 lumbar vertebra, initial encounter (HCC) - TLSO brace   Closed displaced fracture of greater trochanter of left femur (HCC) Pubic ramus fracture, left, with routine healing, subsequent encounter Nonoperative.  Failed outpatient management.  Requiring multiple doses of IV opiates overnight. - PT/OT - TOC consult        Dehydration GIven IV fluids overnight - Repeat BMP  Fall at home, subsequent encounter    Depression - Continue fluoxetine and Seroquel  Hypertension BP elevated due to pain. Not on home meds.  Parkinson's disease - Continue Sinemet, mirapex         Advance Care Planning: Full code  Consults: None needed  Family Communication: None present  Severity of Illness: The appropriate patient status for this patient is INPATIENT. Inpatient status is judged to be reasonable and necessary in order to provide the required intensity of service to ensure the patient's safety. The patient's presenting symptoms, physical exam findings, and initial radiographic and laboratory data in the context of their chronic comorbidities is felt to place them at high risk for further clinical deterioration. Furthermore, it is not anticipated that the patient will be medically stable for discharge from the hospital within 2 midnights of admission.   *  I certify that at the point of admission it is my clinical judgment that the patient will require inpatient hospital care spanning beyond 2 midnights from the point of admission due to high intensity of service, high risk for further deterioration and high frequency of surveillance required.*  Author: Alberteen Sam, MD 08/31/2022 8:40 AM  For on call review [email or web address].

## 2022-08-31 NOTE — Evaluation (Signed)
Physical Therapy Evaluation Patient Details Name: Patricia Fitzpatrick MRN: [id number] DOB: [birth year] Today's Date: 08/31/2022  History of Present Illness  77 y.o. female presents to [facility] hospital on 08/30/2022. Pt fell recently and was diagnosed with pubic ramus fx and sent home. After discharge pt had been unable to stand or walk. New imaging noted L1 compression fx and L femur greater trochanter fx. PMH includes anxiety, depression, HLD, HTN, parkinsonism.  Clinical Impression  Pt presents to PT with deficits in functional mobility, gait, balance, endurance, strength, power, cognition. Upon PT arrival pt reports potentially hallucinating someone telling her that she had a stroke, also with poor recall of date throughout session. Pt reports she feels her cognition is impaired compared to baseline and she is distraught by this. Pt requires physical assistance for all mobility tasks at this time and is at a high risk for falls due to weakness and retropulsion. Pt requires maxA for brace management and is unable to recall back and hip precautions at this time. PT recommends short term inpatient PT services at the time of discharge.        Assistance Recommended at Discharge Frequent or constant Supervision/Assistance  If plan is discharge home, recommend the following:  Can travel by private vehicle  A lot of help with walking and/or transfers;A lot of help with bathing/dressing/bathroom;Assistance with cooking/housework;Direct supervision/assist for medications management;Direct supervision/assist for financial management;Assist for transportation;Help with stairs or ramp for entrance   Yes    Equipment Recommendations Wheelchair (measurements PT)  Recommendations for Other Services       Functional Status Assessment Patient has had a recent decline in their functional status and demonstrates the ability to make significant improvements in function in a reasonable and predictable amount of time.      Precautions / Restrictions Precautions Precautions: Fall;Back;Other (comment) Precaution Booklet Issued: Yes (comment) Precaution Comments: no active L hip abduction Required Braces or Orthoses: Spinal Brace Spinal Brace: Thoracolumbosacral orthotic;Applied in sitting position Restrictions Weight Bearing Restrictions: Yes LLE Weight Bearing: Weight bearing as tolerated      Mobility  Bed Mobility Overal bed mobility: Needs Assistance Bed Mobility: Rolling, Sidelying to Sit, Sit to Sidelying Rolling: Mod assist Sidelying to sit: Mod assist     Sit to sidelying: Mod assist      Transfers Overall transfer level: Needs assistance Equipment used: Rolling walker (2 wheels) Transfers: Sit to/from Stand Sit to Stand: Min assist, +2 physical assistance           General transfer comment: posterior lean    Ambulation/Gait Ambulation/Gait assistance: Min assist Gait Distance (Feet): 15 Feet Assistive device: Rolling walker (2 wheels) Gait Pattern/deviations: Step-to pattern, Shuffle Gait velocity: reduced Gait velocity interpretation: <1.31 ft/sec, indicative of household ambulator   General Gait Details: posterior lean, short shuffling steps  Stairs            Wheelchair Mobility     Tilt Bed    Modified Rankin (Stroke Patients Only)       Balance Overall balance assessment: Needs assistance Sitting-balance support: Single extremity supported, Feet supported Sitting balance-Leahy Scale: Poor Sitting balance - Comments: posterior lean, minG-minA Postural control: Posterior lean Standing balance support: Bilateral upper extremity supported Standing balance-Leahy Scale: Poor Standing balance comment: minG-minA, posterior lean                             Pertinent Vitals/Pain Pain Assessment Pain Assessment: No/denies pain    Home  Living Family/patient expects to be discharged to:: Private residence Living Arrangements: Children;Other  relatives Available Help at Discharge: Family;Available PRN/intermittently Type of Home: House Home Access: Stairs to enter Entrance Stairs-Rails: Can reach both Entrance Stairs-Number of Steps: 3   Home Layout: Two level;Able to live on main level with bedroom/bathroom Home Equipment: Rolling Walker (2 wheels);Rollator (4 wheels);Cane - quad      Prior Function Prior Level of Function : Independent/Modified Independent             Mobility Comments: history of falls, reports utilizing rollator PRN recently. this fall was without DME ADLs Comments: pt reports performing ADLs independently typically     Hand Dominance        Extremity/Trunk Assessment   Upper Extremity Assessment Upper Extremity Assessment: Defer to OT evaluation    Lower Extremity Assessment Lower Extremity Assessment: Generalized weakness;RLE deficits/detail;LLE deficits/detail RLE Deficits / Details: BLE tremors LLE Deficits / Details: BLE tremors    Cervical / Trunk Assessment Cervical / Trunk Assessment: Kyphotic;Other exceptions Cervical / Trunk Exceptions: TLSO  Communication   Communication: No difficulties  Cognition Arousal/Alertness: Awake/alert Behavior During Therapy: Anxious Overall Cognitive Status: Impaired/Different from baseline Area of Impairment: Orientation, Attention, Memory, Following commands, Safety/judgement, Awareness, Problem solving                 Orientation Level: Disoriented to, Time (oriented to month, year and day of the week but not date) Current Attention Level: Sustained Memory: Decreased recall of precautions, Decreased short-term memory Following Commands: Follows one step commands with increased time, Follows multi-step commands inconsistently Safety/Judgement: Decreased awareness of safety, Decreased awareness of deficits Awareness: Emergent Problem Solving: Slow processing, Difficulty sequencing, Requires verbal cues          General Comments  General comments (skin integrity, edema, etc.): VSS on RA    Exercises     Assessment/Plan    PT Assessment Patient needs continued PT services  PT Problem List Decreased strength;Decreased activity tolerance;Decreased mobility;Decreased balance;Decreased cognition;Decreased knowledge of use of DME;Decreased safety awareness;Decreased knowledge of precautions;Pain       PT Treatment Interventions Gait training;DME instruction;Functional mobility training;Therapeutic activities;Therapeutic exercise;Balance training;Neuromuscular re-education;Patient/family education;Cognitive remediation    PT Goals (Current goals can be found in the Care Plan section)  Acute Rehab PT Goals Patient Stated Goal: to return to independence in mobility PT Goal Formulation: With patient Time For Goal Achievement: 09/14/22 Potential to Achieve Goals: Fair    Frequency Min 2X/week     Co-evaluation               AM-PAC PT "6 Clicks" Mobility  Outcome Measure Help needed turning from your back to your side while in a flat bed without using bedrails?: A Lot Help needed moving from lying on your back to sitting on the side of a flat bed without using bedrails?: A Lot Help needed moving to and from a bed to a chair (including a wheelchair)?: A Lot Help needed standing up from a chair using your arms (e.g., wheelchair or bedside chair)?: A Lot Help needed to walk in hospital room?: A Lot Help needed climbing 3-5 steps with a railing? : Total 6 Click Score: 11    End of Session Equipment Utilized During Treatment: Back brace Activity Tolerance: Patient tolerated treatment well Patient left: in bed;with call bell/phone within reach;with bed alarm set Nurse Communication: Mobility status PT Visit Diagnosis: Other abnormalities of gait and mobility (R26.89);Muscle weakness (generalized) (M62.81);Other symptoms and signs involving the nervous system (  R29.898)    Time: 5784-6962 PT Time Calculation  (min) (ACUTE ONLY): 40 min   Charges:   PT Evaluation $PT Eval Low Complexity: 1 Low   PT General Charges $$ ACUTE PT VISIT: 1 Visit         Arlyss Gandy, PT, DPT Acute Rehabilitation Office [phone]   Arlyss Gandy 08/31/2022, 4:54 PM

## 2022-08-31 NOTE — Assessment & Plan Note (Addendum)
TLSO brace

## 2022-08-31 NOTE — Progress Notes (Signed)
Patient not available for EEG at the moment due to will soon be going to MRI.Tech will try back after MRI as schedule allows

## 2022-08-31 NOTE — Assessment & Plan Note (Signed)
-   Continue fluoxetine and Seroquel

## 2022-08-31 NOTE — Plan of Care (Signed)
  Problem: Clinical Measurements: Goal: Ability to maintain clinical measurements within normal limits will improve Outcome: Progressing Goal: Will remain free from infection Outcome: Progressing Goal: Diagnostic test results will improve Outcome: Progressing Goal: Respiratory complications will improve Outcome: Progressing Goal: Cardiovascular complication will be avoided Outcome: Progressing   Problem: Elimination: Goal: Will not experience complications related to bowel motility Outcome: Progressing Goal: Will not experience complications related to urinary retention Outcome: Progressing   Problem: Safety: Goal: Ability to remain free from injury will improve Outcome: Progressing   Problem: Skin Integrity: Goal: Risk for impaired skin integrity will decrease Outcome: Progressing   

## 2022-09-01 ENCOUNTER — Inpatient Hospital Stay (HOSPITAL_COMMUNITY): Payer: Medicare PPO

## 2022-09-01 DIAGNOSIS — R569 Unspecified convulsions: Secondary | ICD-10-CM

## 2022-09-01 DIAGNOSIS — S32010A Wedge compression fracture of first lumbar vertebra, initial encounter for closed fracture: Secondary | ICD-10-CM | POA: Diagnosis not present

## 2022-09-01 DIAGNOSIS — R4182 Altered mental status, unspecified: Secondary | ICD-10-CM

## 2022-09-01 LAB — CBC
HCT: 38.8 % (ref 36.0–46.0)
Hemoglobin: 12.6 g/dL (ref 12.0–15.0)
MCH: 30.9 pg (ref 26.0–34.0)
MCHC: 32.5 g/dL (ref 30.0–36.0)
MCV: 95.1 fL (ref 80.0–100.0)
Platelets: 214 10*3/uL (ref 150–400)
RBC: 4.08 MIL/uL (ref 3.87–5.11)
RDW: 12.4 % (ref 11.5–15.5)
WBC: 6.9 10*3/uL (ref 4.0–10.5)
nRBC: 0 % (ref 0.0–0.2)

## 2022-09-01 LAB — CK: Total CK: 76 U/L (ref 38–234)

## 2022-09-01 LAB — BASIC METABOLIC PANEL
Anion gap: 6 (ref 5–15)
BUN: 16 mg/dL (ref 8–23)
CO2: 27 mmol/L (ref 22–32)
Calcium: 8.2 mg/dL — ABNORMAL LOW (ref 8.9–10.3)
Chloride: 104 mmol/L (ref 98–111)
Creatinine, Ser: 0.69 mg/dL (ref 0.44–1.00)
GFR, Estimated: >60 mL/min (ref >60)
Glucose, Bld: 106 mg/dL — ABNORMAL HIGH (ref 70–99)
Potassium: 3.8 mmol/L (ref 3.5–5.1)
Sodium: 137 mmol/L (ref 135–145)

## 2022-09-01 LAB — TSH: TSH: 0.608 u[IU]/mL (ref 0.350–4.500)

## 2022-09-01 LAB — AMMONIA: Ammonia: <10 umol/L (ref 9–35)

## 2022-09-01 LAB — VITAMIN B12: Vitamin B-12: 382 pg/mL (ref 180–914)

## 2022-09-01 MED ORDER — THIAMINE HCL 100 MG/ML IJ SOLN
500.0000 mg | INTRAVENOUS | Status: DC
  Administered 2022-09-01 – 2022-09-02 (×2): 500 mg via INTRAVENOUS
  Filled 2022-09-01 (×3): qty 5

## 2022-09-01 MED ORDER — OLANZAPINE 10 MG IM SOLR: 5.0000 mg | Freq: Four times a day (QID) | INTRAMUSCULAR | Status: DC | PRN

## 2022-09-01 NOTE — Consult Note (Addendum)
ORTHOPAEDIC CONSULTATION  REQUESTING PHYSICIAN: Dimple Nanas, MD  Chief Complaint: Fall  HPI: Patricia Fitzpatrick is a 77 y.o. female with history of Parkinson's, OCD, anxiety/depression, hypertension who presented to the [facility] emergency department with back pain after a fall.  She reported that on 08/26/2022 she fell back hard on the floor while coming out of the bathroom.  She was post to use her rollator but sometimes forgets to use it.  She has a history of the left hip hemiarthroplasty fracture performed by Dr. [name] on 08/23/2020.  She has done well with this. She also has a history of a left inferior pubic ramus fracture after fall on 11/08/21 and left superior pubic ramus fracture after fall on 06/04/22 which Dr. [name] has been following and treating non-operatively.   Today she states pain is well controlled at rest. She has not gotten up today. No pain with movement at the hip when in bed today. Denies numbness or tingling in legs. Does state she has unintentional movement in her feet at times due to her Parkinson's.  Past Medical History:  Diagnosis Date   Anxiety    Cataracts, bilateral    Depression    High cholesterol    Hypertension    OCD (obsessive compulsive disorder)    Parkinsonism 10/14/2015   Tremor    Past Surgical History:  Procedure Laterality Date   BREAST BIOPSY  1981   CATARACT EXTRACTION Right 2017   EYE SURGERY  1952   HIP ARTHROPLASTY Left 08/23/2020   Procedure: ARTHROPLASTY BIPOLAR HIP (HEMIARTHROPLASTY);  Surgeon: Bjorn Pippin, MD;  Location: [facility];  Service: Orthopedics;  Laterality: Left;   Social History   Socioeconomic History   Marital status: Widowed    Spouse name: Not on file   Number of children: 0   Years of education: Bachelors   Highest education level: Not on file  Occupational History   Occupation: Retired  Tobacco Use   Smoking status: Never   Smokeless tobacco: Never  Substance and Sexual Activity   Alcohol use: Not  Currently    Comment: One glass per year. update 04/21/21 pt can't remember her last drink   Drug use: No   Sexual activity: Not on file  Other Topics Concern   Not on file  Social History Narrative   Lives at home, with adult stepdaughter, and brother.   Right-handed.   No caffeine use.   Social Determinants of Health   Financial Resource Strain: Not on file  Food Insecurity: No Food Insecurity (08/31/2022)   Hunger Vital Sign    Worried About Running Out of Food in the Last Year: Never true    Ran Out of Food in the Last Year: Never true  Transportation Needs: No Transportation Needs (08/31/2022)   PRAPARE - Administrator, Civil Service (Medical): No    Lack of Transportation (Non-Medical): No  Physical Activity: Not on file  Stress: Not on file  Social Connections: Not on file   Family History  Problem Relation Age of Onset   Pulmonary disease Mother    Alzheimer's disease Mother    Heart disease Father    Alzheimer's disease Father    Parkinson's disease Neg Hx    No Known Allergies   Positive ROS: All other systems have been reviewed and were otherwise negative with the exception of those mentioned in the HPI and as above.  Physical Exam: General: Alert, no acute distress, laying in bed Cardiovascular: No  pedal edema Respiratory: No cyanosis, no use of accessory musculature GI: No organomegaly, abdomen is soft and non-tender Skin: No lesions in the area of chief complaint Neurologic: Sensation intact distally Psychiatric: Patient is competent for consent with normal mood and affect Lymphatic: No axillary or cervical lymphadenopathy  MUSCULOSKELETAL: Dorsiflexion and plantarflexion intact at ankle. Sensation intact diffusely at foot. 2+ PT pulse. Full ROM of left knee with about 50 deg active FF at left hip while supine in bed, denies pain while doing this. No TTP to lateral left hip.   Imaging: X-rays of pelvis taken 08/30/2022 show left hip  hemiarthroplasty with new greater trochanter fracture of the left femur.  Left inferior and superior pubic rami fractures, likely to be from previous falls..  Assessment: Left femur periprosthetic greater trochanter fracture with previous left inferior and superior pubic rami fractures  Plan: Fitzpatrick plan to continue to treat this nonoperatively.  Okay for partial weightbearing through left leg.  Plan to follow-up with Dr. [name] after discharge.  Okay to work with physical therapy. Please reach out for any questions or concerns.  Armida Sans, PA-C   09/01/2022 5:48 PM

## 2022-09-01 NOTE — Plan of Care (Signed)
Negative EEG, neurology will sign off but will be available as needed, please reach out with questions or concerns.   Brooke Dare MD-PhD [facility] [phone]

## 2022-09-01 NOTE — [place] FL2 (Signed)
MEDICAID FL2 LEVEL OF CARE FORM     IDENTIFICATION  Patient Name: Patricia Fitzpatrick Birthdate: 1945/06/02 Sex: female Admission Date (Current Location): 08/30/2022  [facility] and [place] Number:  Producer, television/film/video and Address:  [facility], [address] [place], [place] [address]      Provider Number: [id number]  Attending Physician Name and Address:  Dimple Nanas, MD  Relative Name and Phone Number:       Current Level of Care: Hospital Recommended Level of Care: Skilled Nursing Facility Prior Approval Number:    Date Approved/Denied:   PASRR Number: [id number]  Discharge Plan: SNF    Current Diagnoses: Patient Active Problem List   Diagnosis Date Noted   Pubic ramus fracture, left, with routine healing, subsequent encounter 08/31/2022   Closed compression fracture of L1 lumbar vertebra, initial encounter (HCC) 08/31/2022   Dehydration 08/31/2022   Fall at home, subsequent encounter 08/30/2022   Delirium 08/25/2020   Closed displaced fracture of greater trochanter of left femur (HCC) 08/22/2020   Hypertension    Depression    Osteoporosis    Parkinson's disease 10/14/2015   Tremor 10/14/2015   Memory change 10/14/2015    Orientation RESPIRATION BLADDER Height & Weight     Self, Time, Situation, Place  Normal Incontinent Weight: 97 lb (44 kg) Height:  5\' 2"  (157.5 cm)  BEHAVIORAL SYMPTOMS/MOOD NEUROLOGICAL BOWEL NUTRITION STATUS      Continent Diet (regular)  AMBULATORY STATUS COMMUNICATION OF NEEDS Skin   Limited Assist Verbally Normal                       Personal Care Assistance Level of Assistance  Bathing, Feeding, Dressing Bathing Assistance: Limited assistance Feeding assistance: Limited assistance Dressing Assistance: Limited assistance     Functional Limitations Info  Hearing   Hearing Info: Impaired      SPECIAL CARE FACTORS FREQUENCY  PT (By licensed PT), OT (By licensed OT)     PT  Frequency: 5x/wk OT Frequency: 5x/wk            Contractures Contractures Info: Not present    Additional Factors Info  Code Status, Allergies, Psychotropic Code Status Info: Full Allergies Info: NKA Psychotropic Info: Sinemet 1.5 tablets 3x/day with meals; Prozac 40mg  2x/day; Seroquel 37.5mg  at bed         Current Medications (09/01/2022):  This is the current hospital active medication list Current Facility-Administered Medications  Medication Dose Route Frequency Provider Last Rate Last Admin   0.9 %  sodium chloride infusion   Intravenous Continuous Danford, Earl Lites, MD 100 mL/hr at 09/01/22 0700 Infusion Verify at 09/01/22 0700   acetaminophen (TYLENOL) tablet 1,000 mg  1,000 mg Oral TID Alberteen Sam, MD   1,000 mg at 09/01/22 1046   carbidopa-levodopa (SINEMET IR) 25-100 MG per tablet immediate release 1.5 tablet  1.5 tablet Oral TID WC Danford, Earl Lites, MD   1.5 tablet at 09/01/22 1256   enoxaparin (LOVENOX) injection 30 mg  30 mg Subcutaneous Q24H Danford, Earl Lites, MD   30 mg at 09/01/22 1046   FLUoxetine (PROZAC) capsule 40 mg  40 mg Oral BID Alberteen Sam, MD   40 mg at 09/01/22 1046   methocarbamol (ROBAXIN) tablet 500 mg  500 mg Oral Q8H PRN Danford, Earl Lites, MD       morphine (PF) 2 MG/ML injection 4 mg  4 mg Intravenous Q4H PRN Danford, Earl Lites,  MD       OLANZapine (ZYPREXA) injection 5 mg  5 mg Intramuscular Q6H PRN Amin, Ankit Chirag, MD       ondansetron (ZOFRAN) tablet 4 mg  4 mg Oral Q6H PRN Danford, Earl Lites, MD       Or   ondansetron (ZOFRAN) injection 4 mg  4 mg Intravenous Q6H PRN Danford, Earl Lites, MD       oxyCODONE (Oxy IR/ROXICODONE) immediate release tablet 5 mg  5 mg Oral Q4H PRN Alberteen Sam, MD   5 mg at 09/01/22 0135   QUEtiapine (SEROQUEL) tablet 37.5 mg  37.5 mg Oral QHS Alberteen Sam, MD   37.5 mg at 09/01/22 0135     Discharge Medications: Please see discharge  summary for a list of discharge medications.  Relevant Imaging Results:  Relevant Lab Results:   Additional Information SS#: [id number]  Baldemar Lenis, LCSW

## 2022-09-01 NOTE — Progress Notes (Signed)
Physical Therapy Treatment Patient Details Name: Patricia Fitzpatrick MRN: [id number] DOB: July 17, 1945 Today's Date: 09/01/2022   History of Present Illness 77 y.o. female presents to [facility] hospital on 08/30/2022. Pt fell recently and was diagnosed with pubic ramus fx and sent home. After discharge pt had been unable to stand or walk. New imaging noted L1 compression fx and L femur greater trochanter fx. PMH includes anxiety, depression, HLD, HTN, parkinsonism.    PT Comments  Focused session on lower extremity exercises sitting EOB and on bed mobility and lateral scoot transfers while awaiting clarification of new L leg partial weight bearing orders. Maintained L leg NWB this date to ensure safety until orders were clarified. Pt did not recall her precautions to avoid active L hip abduction or her spinal precautions, even when repeated earlier in the session. She is requiring min-modA to perform bed mobility and lateral scoots along EOB while maintaining her precautions. Will continue to follow acutely.     Assistance Recommended at Discharge Frequent or constant Supervision/Assistance  If plan is discharge home, recommend the following:  Can travel by private vehicle    A lot of help with walking and/or transfers;A lot of help with bathing/dressing/bathroom;Assistance with cooking/housework;Direct supervision/assist for medications management;Direct supervision/assist for financial management;Assist for transportation;Help with stairs or ramp for entrance   Yes  Equipment Recommendations  Wheelchair (measurements PT);Wheelchair cushion (measurements PT);BSC/3in1    Recommendations for Other Services       Precautions / Restrictions Precautions Precautions: Fall;Back;Other (comment) Precaution Booklet Issued: Yes (comment) Precaution Comments: no active L hip abduction Required Braces or Orthoses: Spinal Brace Spinal Brace: Thoracolumbosacral orthotic;Applied in sitting  position Restrictions Weight Bearing Restrictions: Yes LLE Weight Bearing: Partial weight bearing LLE Partial Weight Bearing Percentage or Pounds: 50 (Per secure chat with Armida Sans, PA-C with Ortho 7/17) Other Position/Activity Restrictions: no active L hip abduction     Mobility  Bed Mobility Overal bed mobility: Needs Assistance Bed Mobility: Rolling, Sidelying to Sit, Sit to Sidelying Rolling: Min assist Sidelying to sit: Mod assist, HOB elevated     Sit to sidelying: Mod assist, HOB elevated General bed mobility comments: Cues for log rolling, minA to rotate trunk with cues to grab bed rail to pull to roll. ModA to guide legs off EOB and ascend trunk to sit up R EOB with pt utilizing the bed rails. ModA to lift legs and direct trunk for return to sidelying > supine in bed.    Transfers Overall transfer level: Needs assistance                Lateral/Scoot Transfers: Min assist, Mod assist General transfer comment: Deferred transfer to full stand per pt request and secondary to unclear partial weight bearing orders (received clarification of orders after session) for pt's L leg. Thus, focused on lateral scooting to her R along EOB instead, x5 reps, min-modA to shift hips with cues for hand placement and L foot kept off the ground by PT.    Ambulation/Gait               General Gait Details: Deferred standing per pt request and secondary to unclear partial weight bearing orders (received clarification of orders after session) for pt's L leg.   Stairs             Wheelchair Mobility     Tilt Bed    Modified Rankin (Stroke Patients Only)       Balance Overall balance assessment: Needs assistance Sitting-balance  support: Single extremity supported, Feet supported Sitting balance-Leahy Scale: Poor Sitting balance - Comments: posterior lean, minG-minA Postural control: Posterior lean                                  Cognition  Arousal/Alertness: Awake/alert Behavior During Therapy: WFL for tasks assessed/performed Overall Cognitive Status: Impaired/Different from baseline Area of Impairment: Attention, Memory, Following commands, Safety/judgement, Awareness, Problem solving                   Current Attention Level: Sustained Memory: Decreased recall of precautions, Decreased short-term memory Following Commands: Follows one step commands with increased time, Follows multi-step commands inconsistently, Follows one step commands consistently Safety/Judgement: Decreased awareness of safety, Decreased awareness of deficits Awareness: Emergent Problem Solving: Slow processing, Difficulty sequencing, Requires verbal cues General Comments: No recall of her spinal precautions or hip precautions that were reviewed in a prior session. Pt needing redirecting at times. Slow processing and benefits from multi-modal cues.        Exercises General Exercises - Lower Extremity Ankle Circles/Pumps: AROM, Both, 10 reps, Seated Long Arc Quad: AROM, Strengthening, Both, 10 reps, Seated Hip Flexion/Marching: AROM, Strengthening, Both, 10 reps, Seated    General Comments        Pertinent Vitals/Pain Pain Assessment Pain Assessment: Faces Faces Pain Scale: Hurts even more Pain Location: back Pain Descriptors / Indicators: Discomfort, Guarding, Grimacing Pain Intervention(s): Monitored during session, Limited activity within patient's tolerance, Repositioned    Home Living                          Prior Function            PT Goals (current goals can now be found in the care plan section) Acute Rehab PT Goals Patient Stated Goal: to return to independence in mobility PT Goal Formulation: With patient Time For Goal Achievement: 09/14/22 Potential to Achieve Goals: Fair Progress towards PT goals: Progressing toward goals    Frequency    Min 2X/week      PT Plan Equipment recommendations  need to be updated    Co-evaluation              AM-PAC PT "6 Clicks" Mobility   Outcome Measure  Help needed turning from your back to your side while in a flat bed without using bedrails?: A Little Help needed moving from lying on your back to sitting on the side of a flat bed without using bedrails?: A Lot Help needed moving to and from a bed to a chair (including a wheelchair)?: A Lot Help needed standing up from a chair using your arms (e.g., wheelchair or bedside chair)?: A Lot Help needed to walk in hospital room?: A Lot Help needed climbing 3-5 steps with a railing? : Total 6 Click Score: 12    End of Session   Activity Tolerance: Patient tolerated treatment well Patient left: in bed;with call bell/phone within reach;with bed alarm set   PT Visit Diagnosis: Other abnormalities of gait and mobility (R26.89);Muscle weakness (generalized) (M62.81);Other symptoms and signs involving the nervous system (R29.898);Unsteadiness on feet (R26.81);Difficulty in walking, not elsewhere classified (R26.2)     Time: 1610-9604 PT Time Calculation (min) (ACUTE ONLY): 32 min  Charges:    $Therapeutic Exercise: 8-22 mins $Therapeutic Activity: 8-22 mins PT General Charges $$ ACUTE PT VISIT: 1 Visit  Raymond Gurney, PT, DPT Acute Rehabilitation Services  Office: [phone]    Jewel Baize 09/01/2022, 6:17 PM

## 2022-09-01 NOTE — Plan of Care (Signed)
  Problem: Clinical Measurements: Goal: Ability to maintain clinical measurements within normal limits will improve Outcome: Progressing   Problem: Clinical Measurements: Goal: Will remain free from infection Outcome: Progressing   Problem: Clinical Measurements: Goal: Diagnostic test results will improve Outcome: Progressing   Problem: Coping: Goal: Level of anxiety will decrease Outcome: Progressing   Problem: Nutrition: Goal: Adequate nutrition will be maintained Outcome: Progressing   Problem: Pain Managment: Goal: General experience of comfort will improve Outcome: Progressing

## 2022-09-01 NOTE — Progress Notes (Signed)
PROGRESS NOTE    Patricia Fitzpatrick  VHQ:[id number] DOB: 05-16-1945 DOA: 08/30/2022 PCP: [name], MD   Brief Narrative:  77 year old with history of Parkinson's disease, HTN had a mechanical fall few days ago and found to have pubic ramus fracture eventually discharged home from ED.  After 2 days at home she continued to feel extremely weak and could not stand up or ambulate herself.  She was also quite dehydrated.  She was admitted back again in the hospital and imaging showed L1 compression fracture and greater trochanteric fracture.  Case was discussed by EDP with orthopedic and neurosurgery who both recommended nonoperative management.  Weightbearing as tolerated.  PT OT eventually recommended SNF Overnight on 7/16 patient had acute change in mental status and concerns of unequal pupil.  She underwent CT head, CTA head and neck and MRI brain which were all negative.  EEG was also unremarkable.  Patient was seen by neurology team.   Assessment & Plan:  Principal Problem:   Closed compression fracture of L1 lumbar vertebra, initial encounter [facility]) Active Problems:   Closed displaced fracture of greater trochanter of left femur (HCC)   Pubic ramus fracture, left, with routine healing, subsequent encounter   Parkinson's disease   Hypertension   Depression   Fall at home, subsequent encounter   Dehydration     Assessment and Plan: * Closed compression fracture of L1 lumbar vertebra, initial encounter (HCC) Pubic ramus fracture on the left with routine healing Closed displaced fracture of greater trochanter of left femur (HCC) - EDP discussed case with neurosurgery Dr. Dutch Quint and orthopedic Dr. [name] who both recommended conservative management, no surgical need.  Weightbearing as tolerated.  TLSO brace to help with pain. Spoke with Dr Eulah Pont as well from ortho, who will consult but likely remain nonsurgical at this point.   Acute agitation with unequal pupil -Overnight on 7/16 code stroke  was called.  Seen by neurology.  CT head, MRI brain, CTA head and neck and EEG were all unremarkable.  UA is negative.  Zyprexa as needed for agitation once the EKG is performed - Check ammonia, B12, folate, TSH  Dehydration IV fluids  Fall at home, subsequent encounter -PT/OT.  SNF  Depression - Continue fluoxetine and Seroquel  Hypertension BP elevated due to pain. Not on home meds.  Parkinson's disease - Continue Sinemet, mirapex   DVT prophylaxis: Lovenox Code Status: Full  Family Communication:   Status is: Inpatient Remains inpatient appropriate because: Cont hosp stay for AMS and will need placement at SNF       Diet Orders (From admission, onward)     Start     Ordered   08/31/22 2143  Diet NPO time specified  Diet effective now       Comments: until swallow screen is done   08/31/22 2143            Subjective: Seen at bedside.  This morning had agitation and overnight there was concerns of stroke requiring code stroke and extensive evaluation which was negative.  She is retaining urine this morning therefore straight cath has been ordered.   Examination:  General exam: Appears calm and comfortable  Respiratory system: Clear to auscultation. Respiratory effort normal. Cardiovascular system: S1 & S2 heard, RRR. No JVD, murmurs, rubs, gallops or clicks. No pedal edema. Gastrointestinal system: Abdomen is nondistended, soft and nontender. No organomegaly or masses felt. Normal bowel sounds heard. Central nervous system: Alert and oriented. No focal neurological deficits. Extremities: Symmetric  4 x 5 power. Skin: No rashes, lesions or ulcers Psychiatry: Judgement and insight appear poor  Objective: Vitals:   08/31/22 2028 08/31/22 2151 09-22-2022 0006 09/22/2022 0338  BP: (!) 175/89 (!) 197/89 (!) 173/77 (!) 141/74  Pulse: 72 70 61 65  Resp:   18 18  Temp:   98.6 F (37 C) (!) 97.5 F (36.4 C)  TempSrc:   Oral Oral  SpO2:   97% 98%  Weight:       Height:        Intake/Output Summary (Last 24 hours) at Sep 22, 2022 0826 Last data filed at September 22, 2022 0700 Gross per 24 hour  Intake 1033.96 ml  Output 2050 ml  Net -1016.04 ml   Filed Weights   08/30/22 1438 08/31/22 0429  Weight: 45.4 kg 44 kg    Scheduled Meds:  acetaminophen  1,000 mg Oral TID   carbidopa-levodopa  1.5 tablet Oral TID WC   enoxaparin (LOVENOX) injection  30 mg Subcutaneous Q24H   FLUoxetine  40 mg Oral BID   QUEtiapine  37.5 mg Oral QHS   Continuous Infusions:  sodium chloride 100 mL/hr at September 22, 2022 0700    Nutritional status     Body mass index is 17.74 kg/m.  Data Reviewed:   CBC: Recent Labs  Lab 08/30/22 1803 09-22-2022 0357  WBC 9.1 6.9  NEUTROABS 7.8*  --   HGB 14.5 12.6  HCT 44.3 38.8  MCV 94.9 95.1  PLT 244 214   Basic Metabolic Panel: Recent Labs  Lab 08/30/22 1803 2022/09/22 0357  NA 141 137  K 4.2 3.8  CL 105 104  CO2 27 27  GLUCOSE 106* 106*  BUN 29* 16  CREATININE 0.76 0.69  CALCIUM 9.4 8.2*   GFR: Estimated Creatinine Clearance: 41.6 mL/min (by C-G formula based on SCr of 0.69 mg/dL). Liver Function Tests: No results for input(s): "AST", "ALT", "ALKPHOS", "BILITOT", "PROT", "ALBUMIN" in the last 168 hours. No results for input(s): "LIPASE", "AMYLASE" in the last 168 hours. No results for input(s): "AMMONIA" in the last 168 hours. Coagulation Profile: No results for input(s): "INR", "PROTIME" in the last 168 hours. Cardiac Enzymes: No results for input(s): "CKTOTAL", "CKMB", "CKMBINDEX", "TROPONINI" in the last 168 hours. BNP (last 3 results) No results for input(s): "PROBNP" in the last 8760 hours. HbA1C: No results for input(s): "HGBA1C" in the last 72 hours. CBG: Recent Labs  Lab 08/31/22 2150  GLUCAP 103*   Lipid Profile: No results for input(s): "CHOL", "HDL", "LDLCALC", "TRIG", "CHOLHDL", "LDLDIRECT" in the last 72 hours. Thyroid Function Tests: No results for input(s): "TSH", "T4TOTAL", "FREET4",  "T3FREE", "THYROIDAB" in the last 72 hours. Anemia Panel: No results for input(s): "VITAMINB12", "FOLATE", "FERRITIN", "TIBC", "IRON", "RETICCTPCT" in the last 72 hours. Sepsis Labs: No results for input(s): "PROCALCITON", "LATICACIDVEN" in the last 168 hours.  No results found for this or any previous visit (from the past 240 hour(s)).       Radiology Studies: EEG adult  Result Date: September 22, 2022 Charlsie Quest, MD     09-22-22  8:19 AM Patient Name: Patricia Fitzpatrick MRN: [id number] Epilepsy Attending: Charlsie Quest Referring Physician/Provider: Gordy Councilman, MD Date: Sep 22, 2022 Duration: 22.44 mins Patient history: 77yo F with ams getting eeg to evaluate for seizure. Level of alertness: Awake AEDs during EEG study: None Technical aspects: This EEG study was done with scalp electrodes positioned according to the 10-20 International system of electrode placement. Electrical activity was reviewed with band pass filter of 1-70Hz , sensitivity of  7 uV/mm, display speed of 61mm/sec with a 60Hz  notched filter applied as appropriate. EEG data were recorded continuously and digitally stored.  Video monitoring was available and reviewed as appropriate. Description: The posterior dominant rhythm consists of 9-10 Hz activity of moderate voltage (25-35 uV) seen predominantly in posterior head regions, symmetric and reactive to eye opening and eye closing. Physiologic photic driving was not seen during photic stimulation.  Hyperventilation was not performed.   IMPRESSION: This study is within normal limits. No seizures or epileptiform discharges were seen throughout the recording. A normal interictal EEG does not exclude the diagnosis of epilepsy. Charlsie Quest   MR BRAIN WO CONTRAST  Result Date: 09/01/2022 CLINICAL DATA:  Acute neurologic deficit EXAM: MRI HEAD WITHOUT CONTRAST TECHNIQUE: Multiplanar, multiecho pulse sequences of the brain and surrounding structures were obtained without intravenous  contrast. COMPARISON:  11/03/2015 FINDINGS: Brain: No acute infarct, mass effect or extra-axial collection. No acute or chronic hemorrhage. There is multifocal hyperintense T2-weighted signal within the white matter. Parenchymal volume and CSF spaces are normal. The midline structures are normal. Vascular: Major flow voids are preserved. Skull and upper cervical spine: Normal calvarium and skull base. Visualized upper cervical spine and soft tissues are normal. Sinuses/Orbits:No paranasal sinus fluid levels or advanced mucosal thickening. No mastoid or middle ear effusion. Normal orbits. IMPRESSION: 1. No acute intracranial abnormality. 2. Findings of chronic small vessel ischemia. Electronically Signed   By: Deatra Robinson M.D.   On: 09/01/2022 01:10   CT ANGIO HEAD NECK W WO CM (CODE STROKE)  Result Date: 08/31/2022 CLINICAL DATA:  Unequal pupils EXAM: CT ANGIOGRAPHY HEAD AND NECK WITH AND WITHOUT CONTRAST TECHNIQUE: Multidetector CT imaging of the head and neck was performed using the standard protocol during bolus administration of intravenous contrast. Multiplanar CT image reconstructions and MIPs were obtained to evaluate the vascular anatomy. Carotid stenosis measurements (when applicable) are obtained utilizing NASCET criteria, using the distal internal carotid diameter as the denominator. RADIATION DOSE REDUCTION: This exam was performed according to the departmental dose-optimization program which includes automated exposure control, adjustment of the mA and/or kV according to patient size and/or use of iterative reconstruction technique. CONTRAST:  75mL OMNIPAQUE IOHEXOL 350 MG/ML SOLN COMPARISON:  None Available. FINDINGS: CTA NECK FINDINGS SKELETON: There is no bony spinal canal stenosis. No lytic or blastic lesion. OTHER NECK: Normal pharynx, larynx and major salivary glands. No cervical lymphadenopathy. Unremarkable thyroid gland. UPPER CHEST: No pneumothorax or pleural effusion. No nodules or  masses. AORTIC ARCH: There is calcific atherosclerosis of the aortic arch. Conventional 3 vessel aortic branching pattern. RIGHT CAROTID SYSTEM: No dissection, occlusion or aneurysm. Mild atherosclerotic calcification at the carotid bifurcation without hemodynamically significant stenosis. LEFT CAROTID SYSTEM: No dissection, occlusion or aneurysm. Mild atherosclerotic calcification at the carotid bifurcation without hemodynamically significant stenosis. VERTEBRAL ARTERIES: Left dominant configuration.There is no dissection, occlusion or flow-limiting stenosis to the skull base (V1-V3 segments). CTA HEAD FINDINGS POSTERIOR CIRCULATION: --Vertebral arteries: Normal V4 segments. --Inferior cerebellar arteries: Normal. --Basilar artery: Normal. --Superior cerebellar arteries: Normal. --Posterior cerebral arteries (PCA): Normal. ANTERIOR CIRCULATION: --Intracranial internal carotid arteries: Normal. --Anterior cerebral arteries (ACA): Normal. Both A1 segments are present. Patent anterior communicating artery (a-comm). --Middle cerebral arteries (MCA): Normal. VENOUS SINUSES: As permitted by contrast timing, patent. ANATOMIC VARIANTS: None Review of the MIP images confirms the above findings. IMPRESSION: 1. No emergent large vessel occlusion or hemodynamically significant stenosis of the head or neck. 2. Mild bilateral carotid bifurcation atherosclerosis without hemodynamically significant stenosis. 3.  No aneurysm. Aortic atherosclerosis (ICD10-I70.0). Electronically Signed   By: Deatra Robinson M.D.   On: 08/31/2022 22:26   CT HEAD CODE STROKE WO CONTRAST  Result Date: 08/31/2022 CLINICAL DATA:  Code stroke.  Acute neurologic deficit EXAM: CT HEAD WITHOUT CONTRAST TECHNIQUE: Contiguous axial images were obtained from the base of the skull through the vertex without intravenous contrast. RADIATION DOSE REDUCTION: This exam was performed according to the departmental dose-optimization program which includes automated  exposure control, adjustment of the mA and/or kV according to patient size and/or use of iterative reconstruction technique. COMPARISON:  06/02/2022 FINDINGS: Brain: There is no mass, hemorrhage or extra-axial collection. The size and configuration of the ventricles and extra-axial CSF spaces are normal. 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 Vascular: Atherosclerotic calcification of the internal carotid arteries at the skull base. No abnormal hyperdensity of the major intracranial arteries or dural venous sinuses. Skull: The visualized skull base, calvarium and extracranial soft tissues are normal. Sinuses/Orbits: No fluid levels or advanced mucosal thickening of the visualized paranasal sinuses. No mastoid or middle ear effusion. The orbits are normal. ASPECTS [facility] Stroke Program Early CT Score) - Ganglionic level infarction (caudate, lentiform nuclei, internal capsule, insula, M1-M3 cortex): 7 - Supraganglionic infarction (M4-M6 cortex): 3 Total score (0-10 with 10 being normal): 10 IMPRESSION: 1. No acute intracranial abnormality. 2. 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 3. ASPECTS is 10. These results were communicated to Dr. Brooke Dare at 10:20 pm on 08/31/2022 by text page via the [facility] messaging system. Extended scout image shows no metallic object in the head, neck, chest, abdomen or pelvis that would be a contraindication to MRI. Electronically Signed   By: Deatra Robinson M.D.   On: 08/31/2022 22:21   CT Lumbar Spine Wo Contrast  Result Date: 08/30/2022 CLINICAL DATA:  Trauma fall EXAM: CT THORACIC AND LUMBAR SPINE WITHOUT CONTRAST TECHNIQUE: Multidetector CT imaging of the thoracic and lumbar spine was performed without contrast. Multiplanar CT image reconstructions were also generated. RADIATION DOSE REDUCTION: This exam was performed according to the departmental dose-optimization program which includes automated exposure  control, adjustment of the mA and/or kV according to patient size and/or use of iterative reconstruction technique. COMPARISON:  MRI 10/09/2018, radiograph 09/01/2018, radiograph 11/24/2016 FINDINGS: CT THORACIC SPINE FINDINGS Alignment: Kyphosis of the lower thoracic spine. Vertebrae: Moderate severe compression fracture of T5 with about 50% loss of vertebral body height anteriorly. 4 mm retropulsion into the spinal canal but no significant canal stenosis. This is age indeterminate but suspect chronic. Severe compression fracture T7 with about 3 mm retropulsion upper vertebral body, this is chronic. Severe chronic burst fracture T11 with about 7 mm retropulsion. Mild to moderate canal stenosis at this level. Chronic moderate severe superior endplate fracture at T12. Mild chronic superior endplate deformity at T9. Paraspinal and other soft tissues: No paravertebral or paraspinal soft tissue abnormality is seen. Parapelvic cysts versus mild hydronephrosis left kidney. Aortic atherosclerosis. Coronary vascular calcifications. Trace left-sided effusion or pleural thickening. Disc levels: Multilevel degenerative osteophytes. Moderate canal stenosis at T11/T11-T12 secondary to retropulsion from chronic fracture. CT LUMBAR SPINE FINDINGS Segmentation: 5 lumbar type vertebrae. Alignment: Normal. Vertebrae: Acute to subacute appearing diffuse fracture involving the L1 vertebral body without significant loss of vertebral body height. About 5 mm retropulsion of the upper vertebral body into the canal but no significant canal stenosis. Moderate superior endplate deformity at L3 of uncertain acuity but new compared to 2020. Paraspinal  and other soft tissues: Mild paravertebral edema at L1. Aortic atherosclerosis Disc levels: At L1-L2, maintained disc space. No canal stenosis. Foramen are patent. At L2-L3, patent disc space. Mild diffuse disc bulge without canal stenosis. The foramen are patent bilaterally. At L3-L4, patent disc  space. No canal stenosis. Facet degenerative changes. The foramen are patent bilaterally. At L4-L5, patent disc space. No canal stenosis. Moderate facet degenerative changes. The foramen are patent. At L5-S1, patent disc space. No canal stenosis. Mild facet degenerative changes. No foraminal narrowing. IMPRESSION: CT THORACIC SPINE IMPRESSION 1. Moderate severe compression fracture of T5 with about 50% loss of vertebral body height. 4 mm retropulsion into the spinal canal but no significant canal stenosis. This is age indeterminate but suspect chronic. 2. Chronic severe compression fracture T7 with about 3 mm retropulsion upper vertebral body. 3. Chronic severe burst fracture T11 with about 7 mm retropulsion. Mild to moderate canal stenosis at this level. 4. Chronic moderate severe superior endplate fracture at T12. Aortic Atherosclerosis (ICD10-I70.0). CT LUMBAR SPINE IMPRESSION *Acute to subacute appearing diffuse fracture involving the L1 vertebral body without significant loss of vertebral body height at this time. About 5 mm retropulsion of the upper vertebral body into the canal but no significant canal stenosis. *Moderate superior endplate deformity at L3 of uncertain acuity but new compared to 2020. *Aortic atherosclerosis. Electronically Signed   By: Jasmine Pang M.D.   On: 08/30/2022 19:17   CT Thoracic Spine Wo Contrast  Result Date: 08/30/2022 CLINICAL DATA:  Trauma fall EXAM: CT THORACIC AND LUMBAR SPINE WITHOUT CONTRAST TECHNIQUE: Multidetector CT imaging of the thoracic and lumbar spine was performed without contrast. Multiplanar CT image reconstructions were also generated. RADIATION DOSE REDUCTION: This exam was performed according to the departmental dose-optimization program which includes automated exposure control, adjustment of the mA and/or kV according to patient size and/or use of iterative reconstruction technique. COMPARISON:  MRI 10/09/2018, radiograph 09/01/2018, radiograph  11/24/2016 FINDINGS: CT THORACIC SPINE FINDINGS Alignment: Kyphosis of the lower thoracic spine. Vertebrae: Moderate severe compression fracture of T5 with about 50% loss of vertebral body height anteriorly. 4 mm retropulsion into the spinal canal but no significant canal stenosis. This is age indeterminate but suspect chronic. Severe compression fracture T7 with about 3 mm retropulsion upper vertebral body, this is chronic. Severe chronic burst fracture T11 with about 7 mm retropulsion. Mild to moderate canal stenosis at this level. Chronic moderate severe superior endplate fracture at T12. Mild chronic superior endplate deformity at T9. Paraspinal and other soft tissues: No paravertebral or paraspinal soft tissue abnormality is seen. Parapelvic cysts versus mild hydronephrosis left kidney. Aortic atherosclerosis. Coronary vascular calcifications. Trace left-sided effusion or pleural thickening. Disc levels: Multilevel degenerative osteophytes. Moderate canal stenosis at T11/T11-T12 secondary to retropulsion from chronic fracture. CT LUMBAR SPINE FINDINGS Segmentation: 5 lumbar type vertebrae. Alignment: Normal. Vertebrae: Acute to subacute appearing diffuse fracture involving the L1 vertebral body without significant loss of vertebral body height. About 5 mm retropulsion of the upper vertebral body into the canal but no significant canal stenosis. Moderate superior endplate deformity at L3 of uncertain acuity but new compared to 2020. Paraspinal and other soft tissues: Mild paravertebral edema at L1. Aortic atherosclerosis Disc levels: At L1-L2, maintained disc space. No canal stenosis. Foramen are patent. At L2-L3, patent disc space. Mild diffuse disc bulge without canal stenosis. The foramen are patent bilaterally. At L3-L4, patent disc space. No canal stenosis. Facet degenerative changes. The foramen are patent bilaterally. At L4-L5, patent disc space.  No canal stenosis. Moderate facet degenerative changes. The  foramen are patent. At L5-S1, patent disc space. No canal stenosis. Mild facet degenerative changes. No foraminal narrowing. IMPRESSION: CT THORACIC SPINE IMPRESSION 1. Moderate severe compression fracture of T5 with about 50% loss of vertebral body height. 4 mm retropulsion into the spinal canal but no significant canal stenosis. This is age indeterminate but suspect chronic. 2. Chronic severe compression fracture T7 with about 3 mm retropulsion upper vertebral body. 3. Chronic severe burst fracture T11 with about 7 mm retropulsion. Mild to moderate canal stenosis at this level. 4. Chronic moderate severe superior endplate fracture at T12. Aortic Atherosclerosis (ICD10-I70.0). CT LUMBAR SPINE IMPRESSION *Acute to subacute appearing diffuse fracture involving the L1 vertebral body without significant loss of vertebral body height at this time. About 5 mm retropulsion of the upper vertebral body into the canal but no significant canal stenosis. *Moderate superior endplate deformity at L3 of uncertain acuity but new compared to 2020. *Aortic atherosclerosis. Electronically Signed   By: Jasmine Pang M.D.   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 There is new transverse linear fracture in the upper aspect of greater trochanter of left femur. There is 3-4 mm offset in alignment of fracture fragments. There is deformity in the left inferior pubic ramus. There is deformity in the left superior pubic ramus close to the acetabulum. Age of these fractures is not clear. There is old fracture in the right pubic bone with no interval change. IMPRESSION: Previous left hip arthroplasty. There is a new transverse fracture in the upper aspect of greater trochanter of proximal left femur with 3-4 mm distraction of fracture fragments. New deformities are seen in left  inferior pubic ramus and medial left acetabulum without definite radiolucent fracture line. Age of these fractures is indeterminate. These may be recent or old. Old fracture is seen in right pubic bone with no interval change. Electronically Signed   By: Ernie Avena M.D.   On: 08/30/2022 18:38           LOS: 1 day   Time spent= 35 mins    Dejion Grillo Joline Maxcy, MD [facility]  If 7PM-7AM, please contact night-coverage  09/01/2022, 8:26 AM

## 2022-09-01 NOTE — Consult Note (Signed)
Urology Consult   Physician requesting consult: Dr. Nelson Chimes  Reason for consult: Incomplete bladder emptying  History of Present Illness: Patricia Fitzpatrick is a 77 y.o. with history of Parkinson's disease, HTN had a mechanical fall few days ago and found to have pubic ramus fracture eventually discharged home from ED.  After 2 days at home she continued to feel extremely weak and could not stand up or ambulate herself.  She was also quite dehydrated.  She was admitted back again in the hospital and imaging showed L1 compression fracture and greater trochanteric fracture.  She was noted to have some increased incontinence although has not been particularly ambulatory either.  Her voids have been approximately 200 cc but a bladder scan today indicated approximately 300 cc residual.  Attempts by nursing to place a urethral catheter were unsuccessful for unclear reasons.  Her renal function has remained stable with a baseline serum creatinine of 0.7.  She denies baseline incontinence or significant voiding complaints.  She denies a history of voiding or storage urinary symptoms, hematuria, UTIs, STDs, urolithiasis, GU malignancy/trauma/surgery.  Past Medical History:  Diagnosis Date   Anxiety    Cataracts, bilateral    Depression    High cholesterol    Hypertension    OCD (obsessive compulsive disorder)    Parkinsonism 10/14/2015   Tremor     Past Surgical History:  Procedure Laterality Date   BREAST BIOPSY  1981   CATARACT EXTRACTION Right 2017   EYE SURGERY  1952   HIP ARTHROPLASTY Left 08/23/2020   Procedure: ARTHROPLASTY BIPOLAR HIP (HEMIARTHROPLASTY);  Surgeon: Bjorn Pippin, MD;  Location: [facility];  Service: Orthopedics;  Laterality: Left;     Current Hospital Medications:  Home meds:  No current facility-administered medications on file prior to encounter.   Current Outpatient Medications on File Prior to Encounter  Medication Sig Dispense Refill   acetaminophen (TYLENOL) 500 MG tablet  Take 1 tablet (500 mg total) by mouth every 6 (six) hours as needed for moderate pain. Hold while on norco (Patient taking differently: Take 500 mg by mouth as needed for mild pain or moderate pain.) 30 tablet 0   B Complex Vitamins (B COMPLEX PO) Take 1 tablet by mouth daily.     carbidopa-levodopa (SINEMET IR) 25-100 MG tablet Take 1.5 tablets by mouth 3 (three) times daily. 405 tablet 4   Cholecalciferol (VITAMIN D3 PO) Take 50 mcg by mouth daily.     denosumab (PROLIA) 60 MG/ML SOSY injection Inject 60 mg into the skin every 6 (six) months.     FLUoxetine (PROZAC) 40 MG capsule Take 40 mg by mouth 2 (two) times daily.     HYDROcodone-acetaminophen (NORCO/VICODIN) 5-325 MG tablet Take 1 tablet by mouth every 4 (four) hours as needed. 10 tablet 0   MELOXICAM PO Take 1 tablet by mouth daily as needed (Pain).     QUEtiapine (SEROQUEL) 25 MG tablet Take 1.5 tablets (37.5 mg total) by mouth at bedtime. TAKE 1 AND 1/2 TABLETS BY MOUTH AT BEDTIME 150 tablet 4   vitamin C (ASCORBIC ACID) 500 MG tablet Take 500 mg by mouth daily.       Scheduled Meds:  acetaminophen  1,000 mg Oral TID   carbidopa-levodopa  1.5 tablet Oral TID WC   enoxaparin (LOVENOX) injection  30 mg Subcutaneous Q24H   FLUoxetine  40 mg Oral BID   QUEtiapine  37.5 mg Oral QHS   Continuous Infusions:  sodium chloride 100 mL/hr at 09/01/22 0700  thiamine (VITAMIN B1) injection     PRN Meds:.methocarbamol, morphine injection, OLANZapine, ondansetron **OR** ondansetron (ZOFRAN) IV, oxyCODONE  Allergies: No Known Allergies  Family History  Problem Relation Age of Onset   Pulmonary disease Mother    Alzheimer's disease Mother    Heart disease Father    Alzheimer's disease Father    Parkinson's disease Neg Hx     Social History:  reports that she has never smoked. She has never used smokeless tobacco. She reports that she does not currently use alcohol. She reports that she does not use drugs.  ROS: A complete review of  systems was performed.  All systems are negative except for pertinent findings as noted.  Physical Exam:  Vital signs in last 24 hours: Temp:  [97.5 F (36.4 C)-98.6 F (37 C)] 97.5 F (36.4 C) (07/17 1614) Pulse Rate:  [61-80] 80 (07/17 1614) Resp:  [17-18] 17 (07/17 1614) BP: (134-199)/(73-103) 158/103 (07/17 1614) SpO2:  [97 %-100 %] 100 % (07/17 1614) Constitutional:  Alert and oriented, No acute distress Cardiovascular: No JVD Respiratory: Normal respiratory effort GI: Abdomen is soft, nontender, nondistended, no abdominal masses GU: No CVA tenderness, she has an external catheter in place and her urine appears clear. Lymphatic: No lymphadenopathy Neurologic: Grossly intact, no focal deficits Psychiatric: Normal mood and affect  Laboratory Data:  Recent Labs    08/30/22 1803 09/01/22 0357  WBC 9.1 6.9  HGB 14.5 12.6  HCT 44.3 38.8  PLT 244 214    Recent Labs    08/30/22 1803 09/01/22 0357  NA 141 137  K 4.2 3.8  CL 105 104  GLUCOSE 106* 106*  BUN 29* 16  CALCIUM 9.4 8.2*  CREATININE 0.76 0.69     Results for orders placed or performed during the hospital encounter of 08/30/22 (from the past 24 hour(s))  Glucose, capillary     Status: Abnormal   Collection Time: 08/31/22  9:50 PM  Result Value Ref Range   Glucose-Capillary 103 (H) 70 - 99 mg/dL  Basic metabolic panel     Status: Abnormal   Collection Time: 09/01/22  3:57 AM  Result Value Ref Range   Sodium 137 135 - 145 mmol/L   Potassium 3.8 3.5 - 5.1 mmol/L   Chloride 104 98 - 111 mmol/L   CO2 27 22 - 32 mmol/L   Glucose, Bld 106 (H) 70 - 99 mg/dL   BUN 16 8 - 23 mg/dL   Creatinine, Ser 8.29 0.44 - 1.00 mg/dL   Calcium 8.2 (L) 8.9 - 10.3 mg/dL   GFR, Estimated >56 >21 mL/min   Anion gap 6 5 - 15  CBC     Status: None   Collection Time: 09/01/22  3:57 AM  Result Value Ref Range   WBC 6.9 4.0 - 10.5 K/uL   RBC 4.08 3.87 - 5.11 MIL/uL   Hemoglobin 12.6 12.0 - 15.0 g/dL   HCT 30.8 65.7 - 84.6 %    MCV 95.1 80.0 - 100.0 fL   MCH 30.9 26.0 - 34.0 pg   MCHC 32.5 30.0 - 36.0 g/dL   RDW 96.2 95.2 - 84.1 %   Platelets 214 150 - 400 K/uL   nRBC 0.0 0.0 - 0.2 %  Ammonia     Status: None   Collection Time: 09/01/22 10:19 AM  Result Value Ref Range   Ammonia <10 9 - 35 umol/L  TSH     Status: None   Collection Time: 09/01/22 10:19 AM  Result  Value Ref Range   TSH 0.608 0.350 - 4.500 uIU/mL  CK     Status: None   Collection Time: 09/01/22 10:19 AM  Result Value Ref Range   Total CK 76 38 - 234 U/L  Vitamin B12     Status: None   Collection Time: 09/01/22 10:19 AM  Result Value Ref Range   Vitamin B-12 382 180 - 914 pg/mL   No results found for this or any previous visit (from the past 240 hour(s)).  Renal Function: Recent Labs    08/30/22 1803 09/01/22 0357  CREATININE 0.76 0.69   Estimated Creatinine Clearance: 41.6 mL/min (by C-G formula based on SCr of 0.69 mg/dL).  Radiologic Imaging: EEG adult  Result Date: 09/01/2022 Charlsie Quest, MD     09/01/2022  8:19 AM Patient Name: KENSLEE ACHORN MRN: [id number] Epilepsy Attending: Charlsie Quest Referring Physician/Provider: Gordy Councilman, MD Date: 09/01/2022 Duration: 22.44 mins Patient history: 77yo F with ams getting eeg to evaluate for seizure. Level of alertness: Awake AEDs during EEG study: None Technical aspects: This EEG study was done with scalp electrodes positioned according to the 10-20 International system of electrode placement. Electrical activity was reviewed with band pass filter of 1-70Hz , sensitivity of 7 uV/mm, display speed of 11mm/sec with a 60Hz  notched filter applied as appropriate. EEG data were recorded continuously and digitally stored.  Video monitoring was available and reviewed as appropriate. Description: The posterior dominant rhythm consists of 9-10 Hz activity of moderate voltage (25-35 uV) seen predominantly in posterior head regions, symmetric and reactive to eye opening and eye closing.  Physiologic photic driving was not seen during photic stimulation.  Hyperventilation was not performed.   IMPRESSION: This study is within normal limits. No seizures or epileptiform discharges were seen throughout the recording. A normal interictal EEG does not exclude the diagnosis of epilepsy. Charlsie Quest   MR BRAIN WO CONTRAST  Result Date: 09/01/2022 CLINICAL DATA:  Acute neurologic deficit EXAM: MRI HEAD WITHOUT CONTRAST TECHNIQUE: Multiplanar, multiecho pulse sequences of the brain and surrounding structures were obtained without intravenous contrast. COMPARISON:  11/03/2015 FINDINGS: Brain: No acute infarct, mass effect or extra-axial collection. No acute or chronic hemorrhage. There is multifocal hyperintense T2-weighted signal within the white matter. Parenchymal volume and CSF spaces are normal. The midline structures are normal. Vascular: Major flow voids are preserved. Skull and upper cervical spine: Normal calvarium and skull base. Visualized upper cervical spine and soft tissues are normal. Sinuses/Orbits:No paranasal sinus fluid levels or advanced mucosal thickening. No mastoid or middle ear effusion. Normal orbits. IMPRESSION: 1. No acute intracranial abnormality. 2. Findings of chronic small vessel ischemia. Electronically Signed   By: Deatra Robinson M.D.   On: 09/01/2022 01:10   CT ANGIO HEAD NECK W WO CM (CODE STROKE)  Result Date: 08/31/2022 CLINICAL DATA:  Unequal pupils EXAM: CT ANGIOGRAPHY HEAD AND NECK WITH AND WITHOUT CONTRAST TECHNIQUE: Multidetector CT imaging of the head and neck was performed using the standard protocol during bolus administration of intravenous contrast. Multiplanar CT image reconstructions and MIPs were obtained to evaluate the vascular anatomy. Carotid stenosis measurements (when applicable) are obtained utilizing NASCET criteria, using the distal internal carotid diameter as the denominator. RADIATION DOSE REDUCTION: This exam was performed according to  the departmental dose-optimization program which includes automated exposure control, adjustment of the mA and/or kV according to patient size and/or use of iterative reconstruction technique. CONTRAST:  75mL OMNIPAQUE IOHEXOL 350 MG/ML SOLN COMPARISON:  None Available.  FINDINGS: CTA NECK FINDINGS SKELETON: There is no bony spinal canal stenosis. No lytic or blastic lesion. OTHER NECK: Normal pharynx, larynx and major salivary glands. No cervical lymphadenopathy. Unremarkable thyroid gland. UPPER CHEST: No pneumothorax or pleural effusion. No nodules or masses. AORTIC ARCH: There is calcific atherosclerosis of the aortic arch. Conventional 3 vessel aortic branching pattern. RIGHT CAROTID SYSTEM: No dissection, occlusion or aneurysm. Mild atherosclerotic calcification at the carotid bifurcation without hemodynamically significant stenosis. LEFT CAROTID SYSTEM: No dissection, occlusion or aneurysm. Mild atherosclerotic calcification at the carotid bifurcation without hemodynamically significant stenosis. VERTEBRAL ARTERIES: Left dominant configuration.There is no dissection, occlusion or flow-limiting stenosis to the skull base (V1-V3 segments). CTA HEAD FINDINGS POSTERIOR CIRCULATION: --Vertebral arteries: Normal V4 segments. --Inferior cerebellar arteries: Normal. --Basilar artery: Normal. --Superior cerebellar arteries: Normal. --Posterior cerebral arteries (PCA): Normal. ANTERIOR CIRCULATION: --Intracranial internal carotid arteries: Normal. --Anterior cerebral arteries (ACA): Normal. Both A1 segments are present. Patent anterior communicating artery (a-comm). --Middle cerebral arteries (MCA): Normal. VENOUS SINUSES: As permitted by contrast timing, patent. ANATOMIC VARIANTS: None Review of the MIP images confirms the above findings. IMPRESSION: 1. No emergent large vessel occlusion or hemodynamically significant stenosis of the head or neck. 2. Mild bilateral carotid bifurcation atherosclerosis without  hemodynamically significant stenosis. 3. No aneurysm. Aortic atherosclerosis (ICD10-I70.0). Electronically Signed   By: Deatra Robinson M.D.   On: 08/31/2022 22:26   CT HEAD CODE STROKE WO CONTRAST  Result Date: 08/31/2022 CLINICAL DATA:  Code stroke.  Acute neurologic deficit EXAM: CT HEAD WITHOUT CONTRAST TECHNIQUE: Contiguous axial images were obtained from the base of the skull through the vertex without intravenous contrast. RADIATION DOSE REDUCTION: This exam was performed according to the departmental dose-optimization program which includes automated exposure control, adjustment of the mA and/or kV according to patient size and/or use of iterative reconstruction technique. COMPARISON:  06/02/2022 FINDINGS: Brain: There is no mass, hemorrhage or extra-axial collection. The size and configuration of the ventricles and extra-axial CSF spaces are normal. 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 Vascular: Atherosclerotic calcification of the internal carotid arteries at the skull base. No abnormal hyperdensity of the major intracranial arteries or dural venous sinuses. Skull: The visualized skull base, calvarium and extracranial soft tissues are normal. Sinuses/Orbits: No fluid levels or advanced mucosal thickening of the visualized paranasal sinuses. No mastoid or middle ear effusion. The orbits are normal. ASPECTS [facility] Stroke Program Early CT Score) - Ganglionic level infarction (caudate, lentiform nuclei, internal capsule, insula, M1-M3 cortex): 7 - Supraganglionic infarction (M4-M6 cortex): 3 Total score (0-10 with 10 being normal): 10 IMPRESSION: 1. No acute intracranial abnormality. 2. 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 3. ASPECTS is 10. These results were communicated to Dr. Brooke Dare at 10:20 pm on 08/31/2022 by text page via the [facility] messaging system. Extended scout image shows no metallic object in the head, neck, chest,  abdomen or pelvis that would be a contraindication to MRI. Electronically Signed   By: Deatra Robinson M.D.   On: 08/31/2022 22:21   CT Lumbar Spine Wo Contrast  Result Date: 08/30/2022 CLINICAL DATA:  Trauma fall EXAM: CT THORACIC AND LUMBAR SPINE WITHOUT CONTRAST TECHNIQUE: Multidetector CT imaging of the thoracic and lumbar spine was performed without contrast. Multiplanar CT image reconstructions were also generated. RADIATION DOSE REDUCTION: This exam was performed according to the departmental dose-optimization program which includes automated exposure control, adjustment of the mA and/or kV according to patient size and/or use of iterative  reconstruction technique. COMPARISON:  MRI 10/09/2018, radiograph 09/01/2018, radiograph 11/24/2016 FINDINGS: CT THORACIC SPINE FINDINGS Alignment: Kyphosis of the lower thoracic spine. Vertebrae: Moderate severe compression fracture of T5 with about 50% loss of vertebral body height anteriorly. 4 mm retropulsion into the spinal canal but no significant canal stenosis. This is age indeterminate but suspect chronic. Severe compression fracture T7 with about 3 mm retropulsion upper vertebral body, this is chronic. Severe chronic burst fracture T11 with about 7 mm retropulsion. Mild to moderate canal stenosis at this level. Chronic moderate severe superior endplate fracture at T12. Mild chronic superior endplate deformity at T9. Paraspinal and other soft tissues: No paravertebral or paraspinal soft tissue abnormality is seen. Parapelvic cysts versus mild hydronephrosis left kidney. Aortic atherosclerosis. Coronary vascular calcifications. Trace left-sided effusion or pleural thickening. Disc levels: Multilevel degenerative osteophytes. Moderate canal stenosis at T11/T11-T12 secondary to retropulsion from chronic fracture. CT LUMBAR SPINE FINDINGS Segmentation: 5 lumbar type vertebrae. Alignment: Normal. Vertebrae: Acute to subacute appearing diffuse fracture involving the L1  vertebral body without significant loss of vertebral body height. About 5 mm retropulsion of the upper vertebral body into the canal but no significant canal stenosis. Moderate superior endplate deformity at L3 of uncertain acuity but new compared to 2020. Paraspinal and other soft tissues: Mild paravertebral edema at L1. Aortic atherosclerosis Disc levels: At L1-L2, maintained disc space. No canal stenosis. Foramen are patent. At L2-L3, patent disc space. Mild diffuse disc bulge without canal stenosis. The foramen are patent bilaterally. At L3-L4, patent disc space. No canal stenosis. Facet degenerative changes. The foramen are patent bilaterally. At L4-L5, patent disc space. No canal stenosis. Moderate facet degenerative changes. The foramen are patent. At L5-S1, patent disc space. No canal stenosis. Mild facet degenerative changes. No foraminal narrowing. IMPRESSION: CT THORACIC SPINE IMPRESSION 1. Moderate severe compression fracture of T5 with about 50% loss of vertebral body height. 4 mm retropulsion into the spinal canal but no significant canal stenosis. This is age indeterminate but suspect chronic. 2. Chronic severe compression fracture T7 with about 3 mm retropulsion upper vertebral body. 3. Chronic severe burst fracture T11 with about 7 mm retropulsion. Mild to moderate canal stenosis at this level. 4. Chronic moderate severe superior endplate fracture at T12. Aortic Atherosclerosis (ICD10-I70.0). CT LUMBAR SPINE IMPRESSION *Acute to subacute appearing diffuse fracture involving the L1 vertebral body without significant loss of vertebral body height at this time. About 5 mm retropulsion of the upper vertebral body into the canal but no significant canal stenosis. *Moderate superior endplate deformity at L3 of uncertain acuity but new compared to 2020. *Aortic atherosclerosis. Electronically Signed   By: Jasmine Pang M.D.   On: 08/30/2022 19:17   CT Thoracic Spine Wo Contrast  Result Date:  08/30/2022 CLINICAL DATA:  Trauma fall EXAM: CT THORACIC AND LUMBAR SPINE WITHOUT CONTRAST TECHNIQUE: Multidetector CT imaging of the thoracic and lumbar spine was performed without contrast. Multiplanar CT image reconstructions were also generated. RADIATION DOSE REDUCTION: This exam was performed according to the departmental dose-optimization program which includes automated exposure control, adjustment of the mA and/or kV according to patient size and/or use of iterative reconstruction technique. COMPARISON:  MRI 10/09/2018, radiograph 09/01/2018, radiograph 11/24/2016 FINDINGS: CT THORACIC SPINE FINDINGS Alignment: Kyphosis of the lower thoracic spine. Vertebrae: Moderate severe compression fracture of T5 with about 50% loss of vertebral body height anteriorly. 4 mm retropulsion into the spinal canal but no significant canal stenosis. This is age indeterminate but suspect chronic. Severe compression fracture T7 with  about 3 mm retropulsion upper vertebral body, this is chronic. Severe chronic burst fracture T11 with about 7 mm retropulsion. Mild to moderate canal stenosis at this level. Chronic moderate severe superior endplate fracture at T12. Mild chronic superior endplate deformity at T9. Paraspinal and other soft tissues: No paravertebral or paraspinal soft tissue abnormality is seen. Parapelvic cysts versus mild hydronephrosis left kidney. Aortic atherosclerosis. Coronary vascular calcifications. Trace left-sided effusion or pleural thickening. Disc levels: Multilevel degenerative osteophytes. Moderate canal stenosis at T11/T11-T12 secondary to retropulsion from chronic fracture. CT LUMBAR SPINE FINDINGS Segmentation: 5 lumbar type vertebrae. Alignment: Normal. Vertebrae: Acute to subacute appearing diffuse fracture involving the L1 vertebral body without significant loss of vertebral body height. About 5 mm retropulsion of the upper vertebral body into the canal but no significant canal stenosis. Moderate  superior endplate deformity at L3 of uncertain acuity but new compared to 2020. Paraspinal and other soft tissues: Mild paravertebral edema at L1. Aortic atherosclerosis Disc levels: At L1-L2, maintained disc space. No canal stenosis. Foramen are patent. At L2-L3, patent disc space. Mild diffuse disc bulge without canal stenosis. The foramen are patent bilaterally. At L3-L4, patent disc space. No canal stenosis. Facet degenerative changes. The foramen are patent bilaterally. At L4-L5, patent disc space. No canal stenosis. Moderate facet degenerative changes. The foramen are patent. At L5-S1, patent disc space. No canal stenosis. Mild facet degenerative changes. No foraminal narrowing. IMPRESSION: CT THORACIC SPINE IMPRESSION 1. Moderate severe compression fracture of T5 with about 50% loss of vertebral body height. 4 mm retropulsion into the spinal canal but no significant canal stenosis. This is age indeterminate but suspect chronic. 2. Chronic severe compression fracture T7 with about 3 mm retropulsion upper vertebral body. 3. Chronic severe burst fracture T11 with about 7 mm retropulsion. Mild to moderate canal stenosis at this level. 4. Chronic moderate severe superior endplate fracture at T12. Aortic Atherosclerosis (ICD10-I70.0). CT LUMBAR SPINE IMPRESSION *Acute to subacute appearing diffuse fracture involving the L1 vertebral body without significant loss of vertebral body height at this time. About 5 mm retropulsion of the upper vertebral body into the canal but no significant canal stenosis. *Moderate superior endplate deformity at L3 of uncertain acuity but new compared to 2020. *Aortic atherosclerosis. Electronically Signed   By: Jasmine Pang M.D.   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 There is new transverse linear fracture in the upper  aspect of greater trochanter of left femur. There is 3-4 mm offset in alignment of fracture fragments. There is deformity in the left inferior pubic ramus. There is deformity in the left superior pubic ramus close to the acetabulum. Age of these fractures is not clear. There is old fracture in the right pubic bone with no interval change. IMPRESSION: Previous left hip arthroplasty. There is a new transverse fracture in the upper aspect of greater trochanter of proximal left femur with 3-4 mm distraction of fracture fragments. New deformities are seen in left inferior pubic ramus and medial left acetabulum without definite radiolucent fracture line. Age of these fractures is indeterminate. These may be recent or old. Old fracture is seen in right pubic bone with no interval change. Electronically Signed   By: Ernie Avena M.D.   On: 08/30/2022 18:38    I independently reviewed the above imaging studies.  Impression/Recommendation: 1.  Incomplete bladder emptying:  She does not appear to be in complete urinary retention and her renal function is normal.  At this time, I do not think she requires a urethral catheter, which would increase her risk of urinary tract infection.  A renal and bladder ultrasound is pending to further evaluate whether there would be concern for hydronephrosis, etc.  Her poor bladder emptying may be chronic or potentially certainly could be exacerbated by her recent injuries and nonambulatory status.  Crecencio Mc 09/01/2022, 5:02 PM  Moody Bruins. MD   CC: Dr. Nelson Chimes

## 2022-09-01 NOTE — Progress Notes (Signed)
[facility] night cross cover note:   I was notified by RN of the patient's stepdaughter's concern regarding the patient's altered mental status/confusion, and RN conveyed concern regarding asymmetric pupillary response. Code stroke was activated.   CT head showed no acute process. CTA head and neck showed no LVO. Ensuing MRI brain showed no acute process, including no e/o acute ischemic stroke. EEG completed.     Newton Pigg, DO Hospitalist

## 2022-09-01 NOTE — Progress Notes (Signed)
Attempted to I&O cath patient x2, catheter came back out sideways both times, after one attempt by this RN and Consulting civil engineer, MD notified, bladder scanned and visualized

## 2022-09-01 NOTE — TOC CAGE-AID Note (Signed)
Transition of Care [facility]) - CAGE-AID Screening  Patient Details  Name: Patricia Fitzpatrick MRN: [id number] Date of Birth: 1945-10-22  Clinical Narrative:  Patient denies any alcohol or drug use, no need to provide substance abuse resources at this time.  CAGE-AID Screening:   Have You Ever Felt You Ought to Cut Down on Your Drinking or Drug Use?: No Have People Annoyed You By Critizing Your Drinking Or Drug Use?: No Have You Felt Bad Or Guilty About Your Drinking Or Drug Use?: No Have You Ever Had a Drink or Used Drugs First Thing In The Morning to Steady Your Nerves or to Get Rid of a Hangover?: No CAGE-AID Score: 0  Substance Abuse Education Offered: No

## 2022-09-01 NOTE — Progress Notes (Addendum)
Dr. Eulah Pont was contacted regarding this patient's new left hip greater trochanter fracture. Hx of left hip hemiarthroplasty for fracture performed by Dr. [name] 08/23/20. She also has a history of a left inferior pubic ramus fracture after fall on 11/08/21 and left superior pubic ramus fracture after fall on 06/04/22. Pubic rami fractures seen on x-ray today are likely old from these previous fracture.   Plan for partial weight bearing LLE. Full consult to follow.   Janine Ores, PA-C  Addendum: 1:15 PM attempted to see patient, undergoing catheterization. Will consult on patient later this afternoon. Please see recommendations above. Plan to treat non-surgically, ok to work with PT as able.   Janine Ores PA-C

## 2022-09-01 NOTE — Procedures (Signed)
Patient Name: JAMONICA SCHOFF  MRN: [id number]  Epilepsy Attending: Charlsie Quest  Referring Physician/Provider: Gordy Councilman, MD  Date: 09/01/2022 Duration: 22.44 mins  Patient history: 77yo F with ams getting eeg to evaluate for seizure.  Level of alertness: Awake  AEDs during EEG study: None  Technical aspects: This EEG study was done with scalp electrodes positioned according to the 10-20 International system of electrode placement. Electrical activity was reviewed with band pass filter of 1-70Hz , sensitivity of 7 uV/mm, display speed of 64mm/sec with a 60Hz  notched filter applied as appropriate. EEG data were recorded continuously and digitally stored.  Video monitoring was available and reviewed as appropriate.  Description: The posterior dominant rhythm consists of 9-10 Hz activity of moderate voltage (25-35 uV) seen predominantly in posterior head regions, symmetric and reactive to eye opening and eye closing. Physiologic photic driving was not seen during photic stimulation.  Hyperventilation was not performed.     IMPRESSION: This study is within normal limits. No seizures or epileptiform discharges were seen throughout the recording.  A normal interictal EEG does not exclude the diagnosis of epilepsy.  Samuele Storey Annabelle Harman

## 2022-09-01 NOTE — Progress Notes (Signed)
EEG complete - results pending 

## 2022-09-01 NOTE — TOC Initial Note (Signed)
Transition of Care [facility]) - Initial/Assessment Note    Patient Details  Name: Patricia Fitzpatrick MRN: [id number] Date of Birth: Jul 25, 1945  [facility]) CM/SW Contact:    Baldemar Lenis, LCSW Phone Number: 09/01/2022, 4:08 PM  Clinical Narrative:       CSW met with patient to discuss recommendation for SNF placement. Lengthy discussion with patient, as she is aware that her cognition is not what it should be and she is afraid that she's been a "hateful witch" to some people while she's been in the hospital that is not who she is. Patient also discussed concern that some people have not been treating her with respect, but she also isn't sure if they really aren't treating her with respect or if she's just feeling that way because she isn't thinking clearly. Patient extremely distraught about not feeling like herself, often tearful and going off on tangents during discussion. Patient did eventually discuss SNF placement, and that she had been at [facility] several years ago during covid and it felt like a vacation. When CSW asked about patient going, she was initially agreeable but then asking if the doctor would force her to go. Patient agreeable to think about it. CSW faxed out referral, asked [facility] to review. CSW to follow.            Expected Discharge Plan: Skilled Nursing Facility Barriers to Discharge: Continued Medical Work up, English as a second language teacher   Patient Goals and CMS Choice Patient states their goals for this hospitalization and ongoing recovery are:: to get back home and fix my roof CMS Medicare.gov Compare Post Acute Care list provided to:: Patient Choice offered to / list presented to : Patient [place] ownership interest in [facility].provided to:: Patient    Expected Discharge Plan and Services     Post Acute Care Choice: Skilled Nursing Facility Living arrangements for the past 2 months: Single Family Home                                       Prior Living Arrangements/Services Living arrangements for the past 2 months: Single Family Home Lives with:: Siblings, Adult Children Patient language and need for interpreter reviewed:: No Do you feel safe going back to the place where you live?: Yes      Need for Family Participation in Patient Care: No (Comment) Care giver support system in place?: No (comment)   Criminal Activity/Legal Involvement Pertinent to Current Situation/Hospitalization: No - Comment as needed  Activities of Daily Living Home Assistive Devices/Equipment: Dan Humphreys (specify type) ADL Screening (condition at time of admission) Patient's cognitive ability adequate to safely complete daily activities?: Yes Is the patient deaf or have difficulty hearing?: No Does the patient have difficulty seeing, even when wearing glasses/contacts?: No Does the patient have difficulty concentrating, remembering, or making decisions?: No Patient able to express need for assistance with ADLs?: Yes Does the patient have difficulty dressing or bathing?: Yes Independently performs ADLs?: Yes (appropriate for developmental age) Does the patient have difficulty walking or climbing stairs?: Yes Weakness of Legs: Both Weakness of Arms/Hands: None  Permission Sought/Granted Permission sought to share information with : Facility Medical sales representative, Family Supports Permission granted to share information with : Yes, Verbal Permission Granted  Share Information with NAME: Ermalene Searing  Permission granted to share info w AGENCY: SNF  Permission granted to share info w Relationship:  Brother, Step-daughter     Emotional Assessment Appearance:: Appears stated age Attitude/Demeanor/Rapport: Engaged, Crying Affect (typically observed): Anxious, Tearful/Crying Orientation: : Oriented to Self, Oriented to Place, Oriented to  Time, Oriented to Situation Alcohol / Substance Use: Not Applicable Psych Involvement: No  (comment)  Admission diagnosis:  Frequent falls [R29.6] Displaced fracture of greater trochanter of left femur, initial encounter for closed fracture (HCC) [S72.112A] Closed fracture of first lumbar vertebra, unspecified fracture morphology, initial encounter (HCC) [S32.019A] Patient Active Problem List   Diagnosis Date Noted   Pubic ramus fracture, left, with routine healing, subsequent encounter 08/31/2022   Closed compression fracture of L1 lumbar vertebra, initial encounter (HCC) 08/31/2022   Dehydration 08/31/2022   Fall at home, subsequent encounter 08/30/2022   Delirium 08/25/2020   Closed displaced fracture of greater trochanter of left femur (HCC) 08/22/2020   Hypertension    Depression    Osteoporosis    Parkinson's disease 10/14/2015   Tremor 10/14/2015   Memory change 10/14/2015   PCP:  [name], MD Pharmacy:   [facility] [phone] - [place], [place] - [address] AT [facility] [address] [place] [address] Phone: [phone] Fax: [phone]     Social Determinants of Health (SDOH) Social History: SDOH Screenings   Food Insecurity: No Food Insecurity (08/31/2022)  Housing: Low Risk  (08/31/2022)  Transportation Needs: No Transportation Needs (08/31/2022)  Utilities: Not At Risk (08/31/2022)  Depression (PHQ2-9): High Risk (08/17/2022)  Tobacco Use: Low Risk  (08/31/2022)   SDOH Interventions:     Readmission Risk Interventions     No data to display

## 2022-09-02 ENCOUNTER — Inpatient Hospital Stay (HOSPITAL_COMMUNITY): Payer: Medicare PPO

## 2022-09-02 DIAGNOSIS — S32010A Wedge compression fracture of first lumbar vertebra, initial encounter for closed fracture: Secondary | ICD-10-CM | POA: Diagnosis not present

## 2022-09-02 LAB — BASIC METABOLIC PANEL
Anion gap: 14 (ref 5–15)
BUN: 16 mg/dL (ref 8–23)
CO2: 20 mmol/L — ABNORMAL LOW (ref 22–32)
Calcium: 8.2 mg/dL — ABNORMAL LOW (ref 8.9–10.3)
Chloride: 104 mmol/L (ref 98–111)
Creatinine, Ser: 0.64 mg/dL (ref 0.44–1.00)
GFR, Estimated: >60 mL/min (ref >60)
Glucose, Bld: 88 mg/dL (ref 70–99)
Potassium: 3.9 mmol/L (ref 3.5–5.1)
Sodium: 138 mmol/L (ref 135–145)

## 2022-09-02 LAB — CBC
HCT: 36 % (ref 36.0–46.0)
Hemoglobin: 12.4 g/dL (ref 12.0–15.0)
MCH: 31.6 pg (ref 26.0–34.0)
MCHC: 34.4 g/dL (ref 30.0–36.0)
MCV: 91.6 fL (ref 80.0–100.0)
Platelets: 199 10*3/uL (ref 150–400)
RBC: 3.93 MIL/uL (ref 3.87–5.11)
RDW: 12.2 % (ref 11.5–15.5)
WBC: 5.4 10*3/uL (ref 4.0–10.5)
nRBC: 0 % (ref 0.0–0.2)

## 2022-09-02 LAB — FOLATE: Folate: 18 ng/mL (ref >5.9)

## 2022-09-02 LAB — MAGNESIUM: Magnesium: 1.7 mg/dL (ref 1.7–2.4)

## 2022-09-02 MED ORDER — IOHEXOL 350 MG/ML SOLN
100.0000 mL | Freq: Once | INTRAVENOUS | Status: AC | PRN
  Administered 2022-09-02: 100 mL via INTRAVENOUS

## 2022-09-02 NOTE — Progress Notes (Addendum)
PROGRESS NOTE    Patricia Fitzpatrick  BMW:[id number] DOB: [birth year] DOA: 08/30/2022 PCP: [name], MD   Brief Narrative:  77 year old with history of Parkinson's disease, HTN had a mechanical fall few days ago and found to have pubic ramus fracture eventually discharged home from ED.  After 2 days at home she continued to feel extremely weak and could not stand up or ambulate herself.  She was also quite dehydrated.  She was admitted back again in the hospital and imaging showed L1 compression fracture and greater trochanteric fracture.  Case was discussed by EDP with orthopedic and neurosurgery who both recommended nonoperative management.  Weightbearing as tolerated.  PT OT eventually recommended SNF Overnight on 7/16 patient had acute change in mental status and concerns of unequal pupil.  She underwent CT head, CTA head and neck and MRI brain which were all negative.  EEG was also unremarkable.  Patient was seen by neurology team.   Assessment & Plan:  Principal Problem:   Closed compression fracture of L1 lumbar vertebra, initial encounter [facility]) Active Problems:   Closed displaced fracture of greater trochanter of left femur (HCC)   Pubic ramus fracture, left, with routine healing, subsequent encounter   Parkinson's disease   Hypertension   Depression   Fall at home, subsequent encounter   Dehydration     Incomplete Bladder emptying Left sided Hydronephrosis  -No role for foley for now. Renal US shows left hydro, CT A/P ordered per Urology. Will continue to monitor.   * Closed compression fracture of L1 lumbar vertebra, initial encounter (HCC) Pubic ramus fracture on the left with routine healing Closed displaced fracture of greater trochanter of left femur (HCC) - EDP discussed case with neurosurgery Dr. Dutch Quint and orthopedic Dr. [name] who both recommended conservative management, no surgical need.  Weightbearing as tolerated.  TLSO brace to help with pain. Spoke with Dr Eulah Pont as  well from ortho, who will consult but likely remain nonsurgical at this point.   Acute agitation with unequal pupil -Overnight on 7/16 code stroke was called.  Seen by neurology.  CT head, MRI brain, CTA head and neck and EEG were all unremarkable.  UA is negative.  Zyprexa as needed for agitation once the EKG is performed - Check ammonia, B12, folate, TSH  Dehydration IV fluids  Fall at home, subsequent encounter -PT/OT.  SNF  Depression - Continue fluoxetine and Seroquel. Family requesting psych consult.   Hypertension BP elevated due to pain. Not on home meds.  Parkinson's disease - Continue Sinemet, mirapex   DVT prophylaxis: Lovenox Code Status: Full  Family Communication:   Status is: Inpatient Remains inpatient appropriate because: Pending Ct A/p, SNF placement thereafter, TOC working on it.       Diet Orders (From admission, onward)     Start     Ordered   09/01/22 0857  Diet regular Room service appropriate? Yes; Fluid consistency: Thin  Diet effective now       Question Answer Comment  Room service appropriate? Yes   Fluid consistency: Thin      09/01/22 0857            Subjective: Feeling better.  No new complaints.   Examination:  Constitutional: Not in acute distress Respiratory: Clear to auscultation bilaterally Cardiovascular: Normal sinus rhythm, no rubs Abdomen: Nontender nondistended good bowel sounds Musculoskeletal: No edema noted Skin: No rashes seen Neurologic: CN 2-12 grossly intact.  And nonfocal Psychiatric: Normal judgment and insight. Alert and oriented x  4. Normal Mood today.    Objective: Vitals:   09/02/22 0347 09/02/22 0348 09/02/22 0808 09/02/22 1123  BP: (!) 174/84 (!) 166/84 139/84 (!) 169/84  Pulse: 63 (!) 59 71 70  Resp: 18  18 18   Temp: 97.7 F (36.5 C)  98.1 F (36.7 C) 98.2 F (36.8 C)  TempSrc:   Oral Oral  SpO2: 100%  99% 98%  Weight:      Height:        Intake/Output Summary (Last 24 hours) at  09/02/2022 1127 Last data filed at 09/02/2022 0900 Gross per 24 hour  Intake 1255.84 ml  Output 1175 ml  Net 80.84 ml   Filed Weights   08/30/22 1438 08/31/22 0429  Weight: 45.4 kg 44 kg    Scheduled Meds:  acetaminophen  1,000 mg Oral TID   carbidopa-levodopa  1.5 tablet Oral TID WC   enoxaparin (LOVENOX) injection  30 mg Subcutaneous Q24H   FLUoxetine  40 mg Oral BID   QUEtiapine  37.5 mg Oral QHS   Continuous Infusions:  sodium chloride 100 mL/hr at 09/01/22 1819   thiamine (VITAMIN B1) injection 500 mg (09/01/22 1822)    Nutritional status     Body mass index is 17.74 kg/m.  Data Reviewed:   CBC: Recent Labs  Lab 08/30/22 1803 09/01/22 0357 09/02/22 0050  WBC 9.1 6.9 5.4  NEUTROABS 7.8*  --   --   HGB 14.5 12.6 12.4  HCT 44.3 38.8 36.0  MCV 94.9 95.1 91.6  PLT 244 214 199   Basic Metabolic Panel: Recent Labs  Lab 08/30/22 1803 09/01/22 0357 09/02/22 0050  NA 141 137 138  K 4.2 3.8 3.9  CL 105 104 104  CO2 27 27 20*  GLUCOSE 106* 106* 88  BUN 29* 16 16  CREATININE 0.76 0.69 0.64  CALCIUM 9.4 8.2* 8.2*  MG  --   --  1.7   GFR: Estimated Creatinine Clearance: 41.6 mL/min (by C-G formula based on SCr of 0.64 mg/dL). Liver Function Tests: No results for input(s): "AST", "ALT", "ALKPHOS", "BILITOT", "PROT", "ALBUMIN" in the last 168 hours. No results for input(s): "LIPASE", "AMYLASE" in the last 168 hours. Recent Labs  Lab 09/01/22 1019  AMMONIA <10   Coagulation Profile: No results for input(s): "INR", "PROTIME" in the last 168 hours. Cardiac Enzymes: Recent Labs  Lab 09/01/22 1019  CKTOTAL 76   BNP (last 3 results) No results for input(s): "PROBNP" in the last 8760 hours. HbA1C: No results for input(s): "HGBA1C" in the last 72 hours. CBG: Recent Labs  Lab 08/31/22 2150  GLUCAP 103*   Lipid Profile: No results for input(s): "CHOL", "HDL", "LDLCALC", "TRIG", "CHOLHDL", "LDLDIRECT" in the last 72 hours. Thyroid Function  Tests: Recent Labs    09/01/22 1019  TSH 0.608   Anemia Panel: Recent Labs    09/01/22 1019 09/02/22 0346  VITAMINB12 382  --   FOLATE  --  18.0   Sepsis Labs: No results for input(s): "PROCALCITON", "LATICACIDVEN" in the last 168 hours.  No results found for this or any previous visit (from the past 240 hour(s)).       Radiology Studies: US RENAL  Result Date: 09/01/2022 CLINICAL DATA:  69629 Urinary retention 52841 EXAM: RENAL / URINARY TRACT ULTRASOUND COMPLETE COMPARISON:  None Available. FINDINGS: The technologist noted suboptimal exam due to patient's body habitus. Examination is also limited due to technique. Cine loop images were not provided. Right Kidney: Markedly limited evaluation. Renal measurements: 4.8 x 5.6  x 9.5 cm = volume: 134.5 mL. Echogenicity within normal limits. The technologist measured a vague isoechoic 2.4 x 2.7 cm area in the right kidney interpolar region. This is indeterminate in etiology. On the basis of provided images, it is unclear whether this is a focal lesion or portion of renal parenchyma. Further evaluation with nonemergent contrast-enhanced CT scan of the abdomen is recommended. There is probable 1.2 x 2.0 cm sinus cyst in the interpolar region as well. No hydronephrosis. Left Kidney: Markedly limited examination. Renal measurements: 3.9 x 4.5 x 9.0 cm. = volume: 82.6 mL. Echogenicity within normal limits. Mild-to-moderate hydronephrosis. No contour deforming mass seen on the provided images. Bladder: Appears normal for degree of bladder distention. Other: None. IMPRESSION: 1. Markedly limited exam. 2. Mild-to-moderate left hydronephrosis. 3. Apparent isoechoic area in the right kidney, with imaging characteristics, probable diagnosis and follow-up recommendations, as described above. Electronically Signed   By: Jules Schick M.D.   On: 09/01/2022 18:31   EEG adult  Result Date: 09/01/2022 Charlsie Quest, MD     09/01/2022  8:19 AM Patient  Name: Patricia Fitzpatrick MRN: [id number] Epilepsy Attending: Charlsie Quest Referring Physician/Provider: Gordy Councilman, MD Date: 09/01/2022 Duration: 22.44 mins Patient history: 77yo F with ams getting eeg to evaluate for seizure. Level of alertness: Awake AEDs during EEG study: None Technical aspects: This EEG study was done with scalp electrodes positioned according to the 10-20 International system of electrode placement. Electrical activity was reviewed with band pass filter of 1-70Hz , sensitivity of 7 uV/mm, display speed of 60mm/sec with a 60Hz  notched filter applied as appropriate. EEG data were recorded continuously and digitally stored.  Video monitoring was available and reviewed as appropriate. Description: The posterior dominant rhythm consists of 9-10 Hz activity of moderate voltage (25-35 uV) seen predominantly in posterior head regions, symmetric and reactive to eye opening and eye closing. Physiologic photic driving was not seen during photic stimulation.  Hyperventilation was not performed.   IMPRESSION: This study is within normal limits. No seizures or epileptiform discharges were seen throughout the recording. A normal interictal EEG does not exclude the diagnosis of epilepsy. Charlsie Quest   MR BRAIN WO CONTRAST  Result Date: 09/01/2022 CLINICAL DATA:  Acute neurologic deficit EXAM: MRI HEAD WITHOUT CONTRAST TECHNIQUE: Multiplanar, multiecho pulse sequences of the brain and surrounding structures were obtained without intravenous contrast. COMPARISON:  11/03/2015 FINDINGS: Brain: No acute infarct, mass effect or extra-axial collection. No acute or chronic hemorrhage. There is multifocal hyperintense T2-weighted signal within the white matter. Parenchymal volume and CSF spaces are normal. The midline structures are normal. Vascular: Major flow voids are preserved. Skull and upper cervical spine: Normal calvarium and skull base. Visualized upper cervical spine and soft tissues are normal.  Sinuses/Orbits:No paranasal sinus fluid levels or advanced mucosal thickening. No mastoid or middle ear effusion. Normal orbits. IMPRESSION: 1. No acute intracranial abnormality. 2. Findings of chronic small vessel ischemia. Electronically Signed   By: Deatra Robinson M.D.   On: 09/01/2022 01:10   CT ANGIO HEAD NECK W WO CM (CODE STROKE)  Result Date: 08/31/2022 CLINICAL DATA:  Unequal pupils EXAM: CT ANGIOGRAPHY HEAD AND NECK WITH AND WITHOUT CONTRAST TECHNIQUE: Multidetector CT imaging of the head and neck was performed using the standard protocol during bolus administration of intravenous contrast. Multiplanar CT image reconstructions and MIPs were obtained to evaluate the vascular anatomy. Carotid stenosis measurements (when applicable) are obtained utilizing NASCET criteria, using the distal internal carotid diameter as the  denominator. RADIATION DOSE REDUCTION: This exam was performed according to the departmental dose-optimization program which includes automated exposure control, adjustment of the mA and/or kV according to patient size and/or use of iterative reconstruction technique. CONTRAST:  75mL OMNIPAQUE IOHEXOL 350 MG/ML SOLN COMPARISON:  None Available. FINDINGS: CTA NECK FINDINGS SKELETON: There is no bony spinal canal stenosis. No lytic or blastic lesion. OTHER NECK: Normal pharynx, larynx and major salivary glands. No cervical lymphadenopathy. Unremarkable thyroid gland. UPPER CHEST: No pneumothorax or pleural effusion. No nodules or masses. AORTIC ARCH: There is calcific atherosclerosis of the aortic arch. Conventional 3 vessel aortic branching pattern. RIGHT CAROTID SYSTEM: No dissection, occlusion or aneurysm. Mild atherosclerotic calcification at the carotid bifurcation without hemodynamically significant stenosis. LEFT CAROTID SYSTEM: No dissection, occlusion or aneurysm. Mild atherosclerotic calcification at the carotid bifurcation without hemodynamically significant stenosis. VERTEBRAL  ARTERIES: Left dominant configuration.There is no dissection, occlusion or flow-limiting stenosis to the skull base (V1-V3 segments). CTA HEAD FINDINGS POSTERIOR CIRCULATION: --Vertebral arteries: Normal V4 segments. --Inferior cerebellar arteries: Normal. --Basilar artery: Normal. --Superior cerebellar arteries: Normal. --Posterior cerebral arteries (PCA): Normal. ANTERIOR CIRCULATION: --Intracranial internal carotid arteries: Normal. --Anterior cerebral arteries (ACA): Normal. Both A1 segments are present. Patent anterior communicating artery (a-comm). --Middle cerebral arteries (MCA): Normal. VENOUS SINUSES: As permitted by contrast timing, patent. ANATOMIC VARIANTS: None Review of the MIP images confirms the above findings. IMPRESSION: 1. No emergent large vessel occlusion or hemodynamically significant stenosis of the head or neck. 2. Mild bilateral carotid bifurcation atherosclerosis without hemodynamically significant stenosis. 3. No aneurysm. Aortic atherosclerosis (ICD10-I70.0). Electronically Signed   By: Deatra Robinson M.D.   On: 08/31/2022 22:26   CT HEAD CODE STROKE WO CONTRAST  Result Date: 08/31/2022 CLINICAL DATA:  Code stroke.  Acute neurologic deficit EXAM: CT HEAD WITHOUT CONTRAST TECHNIQUE: Contiguous axial images were obtained from the base of the skull through the vertex without intravenous contrast. RADIATION DOSE REDUCTION: This exam was performed according to the departmental dose-optimization program which includes automated exposure control, adjustment of the mA and/or kV according to patient size and/or use of iterative reconstruction technique. COMPARISON:  06/02/2022 FINDINGS: Brain: There is no mass, hemorrhage or extra-axial collection. The size and configuration of the ventricles and extra-axial CSF spaces are normal. 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 Vascular: Atherosclerotic calcification of the internal carotid arteries at the skull base.  No abnormal hyperdensity of the major intracranial arteries or dural venous sinuses. Skull: The visualized skull base, calvarium and extracranial soft tissues are normal. Sinuses/Orbits: No fluid levels or advanced mucosal thickening of the visualized paranasal sinuses. No mastoid or middle ear effusion. The orbits are normal. ASPECTS [facility] Stroke Program Early CT Score) - Ganglionic level infarction (caudate, lentiform nuclei, internal capsule, insula, M1-M3 cortex): 7 - Supraganglionic infarction (M4-M6 cortex): 3 Total score (0-10 with 10 being normal): 10 IMPRESSION: 1. No acute intracranial abnormality. 2. 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 3. ASPECTS is 10. These results were communicated to Dr. Brooke Dare at 10:20 pm on 08/31/2022 by text page via the [facility] messaging system. Extended scout image shows no metallic object in the head, neck, chest, abdomen or pelvis that would be a contraindication to MRI. Electronically Signed   By: Deatra Robinson M.D.   On: 08/31/2022 22:21           LOS: 2 days   Time spent= 35 mins    Boruch Manuele Joline Maxcy, MD [facility]  If  7PM-7AM, please contact night-coverage  09/02/2022, 11:27 AM

## 2022-09-02 NOTE — Progress Notes (Signed)
Post void residual bladder scan reveals 27cc.

## 2022-09-02 NOTE — Consult Note (Signed)
An attempt was made to see the patient today. Patient was not in her room on the floor. She had gone for CT/imaging. Will follow up tomorrow.

## 2022-09-02 NOTE — Progress Notes (Signed)
Patient ID: Patricia Fitzpatrick, female   DOB: [birth year], 76 y.o.   MRN: [id number]  Renal ultrasound with a question of a right renal mass and left hydronephrosis although quality of ultrasound was poor.  Will order a CT scan of the abdomen and pelvis with and without contrast for further definitive evaluation.

## 2022-09-02 NOTE — Progress Notes (Signed)
Responded to bed alarm finding patient with legs over the railing and all clothes and covers stripped off.  IV tubing had been broken in half and blood all over patient and the bed.  Completed a full bath and linen change and reoriented patient to surroundings and situation.  New IV tubing replaced and rewrapped current IV with kerlix.  Patient resting comfortably.  Call bell at reach and bed alarm active and audible.

## 2022-09-02 NOTE — Care Management Important Message (Signed)
Important Message  Patient Details  Name: Patricia Fitzpatrick MRN: [id number] Date of Birth: 12/11/1945   Medicare Important Message Given:  Yes     Dorena Bodo 09/02/2022, 2:32 PM

## 2022-09-03 DIAGNOSIS — S72002D Fracture of unspecified part of neck of left femur, subsequent encounter for closed fracture with routine healing: Secondary | ICD-10-CM | POA: Diagnosis not present

## 2022-09-03 DIAGNOSIS — Z9181 History of falling: Secondary | ICD-10-CM | POA: Diagnosis not present

## 2022-09-03 DIAGNOSIS — F411 Generalized anxiety disorder: Secondary | ICD-10-CM | POA: Diagnosis not present

## 2022-09-03 DIAGNOSIS — F419 Anxiety disorder, unspecified: Secondary | ICD-10-CM | POA: Diagnosis not present

## 2022-09-03 DIAGNOSIS — M6281 Muscle weakness (generalized): Secondary | ICD-10-CM | POA: Diagnosis not present

## 2022-09-03 DIAGNOSIS — M549 Dorsalgia, unspecified: Secondary | ICD-10-CM | POA: Diagnosis not present

## 2022-09-03 DIAGNOSIS — I10 Essential (primary) hypertension: Secondary | ICD-10-CM | POA: Diagnosis not present

## 2022-09-03 DIAGNOSIS — I69828 Other speech and language deficits following other cerebrovascular disease: Secondary | ICD-10-CM | POA: Diagnosis not present

## 2022-09-03 DIAGNOSIS — R3914 Feeling of incomplete bladder emptying: Secondary | ICD-10-CM | POA: Diagnosis not present

## 2022-09-03 DIAGNOSIS — I69391 Dysphagia following cerebral infarction: Secondary | ICD-10-CM | POA: Diagnosis not present

## 2022-09-03 DIAGNOSIS — F332 Major depressive disorder, recurrent severe without psychotic features: Secondary | ICD-10-CM | POA: Diagnosis not present

## 2022-09-03 DIAGNOSIS — F331 Major depressive disorder, recurrent, moderate: Secondary | ICD-10-CM | POA: Diagnosis not present

## 2022-09-03 DIAGNOSIS — S32018D Other fracture of first lumbar vertebra, subsequent encounter for fracture with routine healing: Secondary | ICD-10-CM | POA: Diagnosis not present

## 2022-09-03 DIAGNOSIS — R634 Abnormal weight loss: Secondary | ICD-10-CM | POA: Diagnosis not present

## 2022-09-03 DIAGNOSIS — R2689 Other abnormalities of gait and mobility: Secondary | ICD-10-CM | POA: Diagnosis not present

## 2022-09-03 DIAGNOSIS — F422 Mixed obsessional thoughts and acts: Secondary | ICD-10-CM | POA: Diagnosis not present

## 2022-09-03 DIAGNOSIS — R2681 Unsteadiness on feet: Secondary | ICD-10-CM | POA: Diagnosis not present

## 2022-09-03 DIAGNOSIS — S32010A Wedge compression fracture of first lumbar vertebra, initial encounter for closed fracture: Secondary | ICD-10-CM | POA: Diagnosis not present

## 2022-09-03 DIAGNOSIS — G20A1 Parkinson's disease without dyskinesia, without mention of fluctuations: Secondary | ICD-10-CM | POA: Diagnosis not present

## 2022-09-03 DIAGNOSIS — N130 Hydronephrosis with ureteropelvic junction obstruction: Secondary | ICD-10-CM | POA: Diagnosis not present

## 2022-09-03 DIAGNOSIS — F060 Psychotic disorder with hallucinations due to known physiological condition: Secondary | ICD-10-CM | POA: Diagnosis not present

## 2022-09-03 DIAGNOSIS — R531 Weakness: Secondary | ICD-10-CM | POA: Diagnosis not present

## 2022-09-03 DIAGNOSIS — Z7401 Bed confinement status: Secondary | ICD-10-CM | POA: Diagnosis not present

## 2022-09-03 DIAGNOSIS — R1311 Dysphagia, oral phase: Secondary | ICD-10-CM | POA: Diagnosis not present

## 2022-09-03 DIAGNOSIS — E119 Type 2 diabetes mellitus without complications: Secondary | ICD-10-CM | POA: Diagnosis not present

## 2022-09-03 DIAGNOSIS — F03C4 Unspecified dementia, severe, with anxiety: Secondary | ICD-10-CM | POA: Diagnosis not present

## 2022-09-03 DIAGNOSIS — D49511 Neoplasm of unspecified behavior of right kidney: Secondary | ICD-10-CM | POA: Diagnosis not present

## 2022-09-03 DIAGNOSIS — G20B1 Parkinson's disease with dyskinesia, without mention of fluctuations: Secondary | ICD-10-CM | POA: Diagnosis not present

## 2022-09-03 DIAGNOSIS — R41841 Cognitive communication deficit: Secondary | ICD-10-CM | POA: Diagnosis not present

## 2022-09-03 DIAGNOSIS — F068 Other specified mental disorders due to known physiological condition: Secondary | ICD-10-CM | POA: Diagnosis not present

## 2022-09-03 DIAGNOSIS — W06XXXA Fall from bed, initial encounter: Secondary | ICD-10-CM | POA: Diagnosis not present

## 2022-09-03 DIAGNOSIS — S32010D Wedge compression fracture of first lumbar vertebra, subsequent encounter for fracture with routine healing: Secondary | ICD-10-CM | POA: Diagnosis not present

## 2022-09-03 DIAGNOSIS — S72115D Nondisplaced fracture of greater trochanter of left femur, subsequent encounter for closed fracture with routine healing: Secondary | ICD-10-CM | POA: Diagnosis not present

## 2022-09-03 LAB — BASIC METABOLIC PANEL
Anion gap: 4 — ABNORMAL LOW (ref 5–15)
BUN: 18 mg/dL (ref 8–23)
CO2: 25 mmol/L (ref 22–32)
Calcium: 8.2 mg/dL — ABNORMAL LOW (ref 8.9–10.3)
Chloride: 108 mmol/L (ref 98–111)
Creatinine, Ser: 0.87 mg/dL (ref 0.44–1.00)
GFR, Estimated: >60 mL/min (ref >60)
Glucose, Bld: 94 mg/dL (ref 70–99)
Potassium: 3.9 mmol/L (ref 3.5–5.1)
Sodium: 137 mmol/L (ref 135–145)

## 2022-09-03 LAB — CBC
HCT: 36.9 % (ref 36.0–46.0)
Hemoglobin: 12 g/dL (ref 12.0–15.0)
MCH: 30.7 pg (ref 26.0–34.0)
MCHC: 32.5 g/dL (ref 30.0–36.0)
MCV: 94.4 fL (ref 80.0–100.0)
Platelets: 235 10*3/uL (ref 150–400)
RBC: 3.91 MIL/uL (ref 3.87–5.11)
RDW: 12.7 % (ref 11.5–15.5)
WBC: 5.6 10*3/uL (ref 4.0–10.5)
nRBC: 0 % (ref 0.0–0.2)

## 2022-09-03 LAB — MAGNESIUM: Magnesium: 1.8 mg/dL (ref 1.7–2.4)

## 2022-09-03 MED ORDER — HYDRALAZINE HCL 25 MG PO TABS: 25.0000 mg | ORAL_TABLET | Freq: Four times a day (QID) | ORAL | Status: DC | PRN

## 2022-09-03 MED ORDER — AMLODIPINE BESYLATE 5 MG PO TABS
ORAL_TABLET | ORAL | Status: AC
  Filled 2022-09-03: qty 1

## 2022-09-03 MED ORDER — THIAMINE HCL 100 MG PO TABS: 100.0000 mg | ORAL_TABLET | Freq: Every day | ORAL | 0 refills | Status: DC

## 2022-09-03 MED ORDER — ACETAMINOPHEN 500 MG PO TABS: 1000.0000 mg | ORAL_TABLET | Freq: Three times a day (TID) | ORAL | 0 refills | Status: AC

## 2022-09-03 MED ORDER — POLYETHYLENE GLYCOL 3350 17 G PO PACK
17.0000 g | PACK | Freq: Every day | ORAL | Status: DC
  Administered 2022-09-03: 17 g via ORAL

## 2022-09-03 MED ORDER — POLYETHYLENE GLYCOL 3350 17 G PO PACK: 17.0000 g | PACK | Freq: Every day | ORAL | 0 refills | Status: DC

## 2022-09-03 MED ORDER — AMLODIPINE BESYLATE 5 MG PO TABS
5.0000 mg | ORAL_TABLET | Freq: Every day | ORAL | Status: DC
  Administered 2022-09-03: 5 mg via ORAL

## 2022-09-03 MED ORDER — DOCUSATE SODIUM 100 MG PO CAPS
100.0000 mg | ORAL_CAPSULE | Freq: Two times a day (BID) | ORAL | Status: DC
  Administered 2022-09-03: 100 mg via ORAL

## 2022-09-03 MED ORDER — HYDRALAZINE HCL 25 MG PO TABS: 25.0000 mg | ORAL_TABLET | Freq: Four times a day (QID) | ORAL | 0 refills | Status: DC | PRN

## 2022-09-03 MED ORDER — ENOXAPARIN SODIUM 30 MG/0.3ML IJ SOSY: 30.0000 mg | PREFILLED_SYRINGE | INTRAMUSCULAR | Status: DC

## 2022-09-03 MED ORDER — OXYCODONE HCL 5 MG PO TABS: 5.0000 mg | ORAL_TABLET | ORAL | 0 refills | Status: DC | PRN

## 2022-09-03 MED ORDER — DOCUSATE SODIUM 100 MG PO CAPS
ORAL_CAPSULE | ORAL | Status: AC
  Filled 2022-09-03: qty 1

## 2022-09-03 MED ORDER — DOCUSATE SODIUM 100 MG PO CAPS: 100.0000 mg | ORAL_CAPSULE | Freq: Two times a day (BID) | ORAL | 0 refills | Status: DC

## 2022-09-03 MED ORDER — AMLODIPINE BESYLATE 5 MG PO TABS: 5.0000 mg | ORAL_TABLET | Freq: Every day | ORAL | 1 refills | Status: AC

## 2022-09-03 MED ORDER — QUETIAPINE FUMARATE 25 MG PO TABS: 37.5000 mg | ORAL_TABLET | Freq: Every day | ORAL | 0 refills | Status: DC

## 2022-09-03 MED ORDER — POLYETHYLENE GLYCOL 3350 17 G PO PACK
PACK | ORAL | Status: AC
  Filled 2022-09-03: qty 1

## 2022-09-03 MED ORDER — METHOCARBAMOL 500 MG PO TABS: 500.0000 mg | ORAL_TABLET | Freq: Three times a day (TID) | ORAL | 0 refills | Status: DC | PRN

## 2022-09-03 NOTE — Progress Notes (Signed)
Subjective: 7/19: NAEON. Pt extremely anxious and struggling with some psychological disturbance. Insisted myself and psych provider listen to her account of her stay consecutively  Objective: Vital signs in last 24 hours: Temp:  [97.7 F (36.5 C)-99.1 F (37.3 C)] 99.1 F (37.3 C) (07/19 1259) Pulse Rate:  [62-83] 83 (07/19 1259) Resp:  [18] 18 (07/19 1259) BP: (132-181)/(83-95) 175/95 (07/19 1259) SpO2:  [94 %-100 %] 96 % (07/19 1259)  Intake/Output from previous day: 07/18 0701 - 07/19 0700 In: 1997.3 [I.V.:1947.3; IV Piggyback:50] Out: 542 [Urine:542]  Intake/Output this shift: Total I/O In: 332 [P.O.:177; I.V.:155] Out: 200 [Urine:200]  Physical Exam:  General: Alert and oriented CV: No cyanosis Lungs: equal chest rise    Lab Results: Recent Labs    09/01/22 0357 09/02/22 0050 09/03/22 0317  HGB 12.6 12.4 12.0  HCT 38.8 36.0 36.9   BMET Recent Labs    09/02/22 0050 09/03/22 0317  NA 138 137  K 3.9 3.9  CL 104 108  CO2 20* 25  GLUCOSE 88 94  BUN 16 18  CREATININE 0.64 0.87  CALCIUM 8.2* 8.2*     Studies/Results: CT ABDOMEN PELVIS W WO CONTRAST  Result Date: 09/02/2022 CLINICAL DATA:  Hydronephrosis Renal mass/cyst, indeterminate EXAM: CT ABDOMEN AND PELVIS WITHOUT AND WITH CONTRAST TECHNIQUE: Multidetector CT imaging of the abdomen and pelvis was performed following the standard protocol before and following the bolus administration of intravenous contrast. RADIATION DOSE REDUCTION: This exam was performed according to the departmental dose-optimization program which includes automated exposure control, adjustment of the mA and/or kV according to patient size and/or use of iterative reconstruction technique. CONTRAST:  OMNIPAQUE IOHEXOL 350 MG/ML SOLN COMPARISON:  Ultrasound renal from yesterday. FINDINGS: Lower chest: There are patchy atelectatic changes in the visualized lung bases. No overt consolidation. No pleural effusion. The heart  is normal in size. No pericardial effusion. Hepatobiliary: The liver is normal in size. Non-cirrhotic configuration. There is a 11 x 21 mm subcapsular, homogeneously enhancing lesion in the right hepatic lobe, segment 5. There is a vessel traversing through the lesion without displacement. This is favored to represent a flash filling hemangioma. No intrahepatic or extrahepatic bile duct dilation. Probable noncalcified gallstone. Normal gallbladder wall thickness. No pericholecystic inflammatory changes. Pancreas: Unremarkable. No pancreatic ductal dilatation or surrounding inflammatory changes. Spleen: Within normal limits. No focal lesion. Adrenals/Urinary Tract: Adrenal glands are unremarkable. No suspicious renal mass. There is no focal lesion in the right kidney, which corresponds to the observation described on the prior ultrasound exam. Findings on prior ultrasound therefore favored normal renal parenchyma. There are multiple bilateral sinus cysts (left-greater-than-right). There are at least 2, subcentimeter, hypoattenuating foci in the right kidney, favored to represent cysts. No hydronephrosis. Bilateral extrarenal pelves noted. No renal or ureteric calculi. Unremarkable urinary bladder. Stomach/Bowel: No disproportionate dilation of the small or large bowel loops. No evidence of abnormal bowel wall thickening or inflammatory changes. The appendix was not visualized; however there is no acute inflammatory process in the right lower quadrant. Vascular/Lymphatic: No ascites or pneumoperitoneum. No abdominal or pelvic lymphadenopathy, by size criteria. No aneurysmal dilation of the major abdominal arteries. There are mild peripheral atherosclerotic vascular calcifications of the aorta and its major branches. Reproductive: The uterus is unremarkable. No large adnexal mass. Other: There is a tiny fat containing umbilical hernia. The soft tissues and abdominal wall are otherwise unremarkable. Musculoskeletal: No  suspicious osseous lesions. Redemonstration of severe compression deformity of T11 vertebrae and mild compression  deformity of T12 and L3 vertebrae. There is subacute/healing burst fracture of L1 vertebra. There are moderate multilevel degenerative changes in the visualized spine. IMPRESSION: 1. No suspicious renal mass. No focal lesion in the right kidney, which corresponds to the observations described on the prior ultrasound exam. Findings on prior ultrasound therefore favored normal renal parenchyma. 2. Multiple other nonacute observations, as described above. Aortic Atherosclerosis (ICD10-I70.0). Electronically Signed   By: Jules Schick M.D.   On: 09/02/2022 15:18   US RENAL  Result Date: 09/01/2022 CLINICAL DATA:  40102 Urinary retention [id number] EXAM: RENAL / URINARY TRACT ULTRASOUND COMPLETE COMPARISON:  None Available. FINDINGS: The technologist noted suboptimal exam due to patient's body habitus. Examination is also limited due to technique. Cine loop images were not provided. Right Kidney: Markedly limited evaluation. Renal measurements: 4.8 x 5.6 x 9.5 cm = volume: 134.5 mL. Echogenicity within normal limits. The technologist measured a vague isoechoic 2.4 x 2.7 cm area in the right kidney interpolar region. This is indeterminate in etiology. On the basis of provided images, it is unclear whether this is a focal lesion or portion of renal parenchyma. Further evaluation with nonemergent contrast-enhanced CT scan of the abdomen is recommended. There is probable 1.2 x 2.0 cm sinus cyst in the interpolar region as well. No hydronephrosis. Left Kidney: Markedly limited examination. Renal measurements: 3.9 x 4.5 x 9.0 cm. = volume: 82.6 mL. Echogenicity within normal limits. Mild-to-moderate hydronephrosis. No contour deforming mass seen on the provided images. Bladder: Appears normal for degree of bladder distention. Other: None. IMPRESSION: 1. Markedly limited exam. 2. Mild-to-moderate left  hydronephrosis. 3. Apparent isoechoic area in the right kidney, with imaging characteristics, probable diagnosis and follow-up recommendations, as described above. Electronically Signed   By: Jules Schick M.D.   On: 09/01/2022 18:31    Assessment/Plan: #Incomplete bladder eptying #Hydronephrosis #Renal Mass  CT A/P  7/18: No renal masses or lesions. No hydronephrosis. Pt does not require a foley catheter  or further urologic intervention. Urology will sign off at this time. Please call with questions.    LOS: 3 days   Elmon Kirschner, NP [facility] Pager: [phone]  09/03/2022, 2:16 PM

## 2022-09-03 NOTE — Consult Note (Signed)
[facility] Face-to-Face Psychiatry Consult   Reason for Consult:  medication adjustment  for depression Referring Physician:  Ankit Chirag Patient Identification: Patricia Fitzpatrick MRN:  [id number] Principal Diagnosis: Closed compression fracture of L1 lumbar vertebra, initial encounter [facility]) Diagnosis:  Principal Problem:   Closed compression fracture of L1 lumbar vertebra, initial encounter [facility]) Active Problems:   Parkinson's disease   Closed displaced fracture of greater trochanter of left femur (HCC)   Hypertension   Depression   Fall at home, subsequent encounter   Pubic ramus fracture, left, with routine healing, subsequent encounter   Dehydration   Total Time spent with patient: 15 minutes  Subjective:   Patricia BOULTINGHOUSE is a 77 y.o. female patient admitted with closed compression fracture, psychiatry consult was placed for medication adjustment for depression.   Alvine was seen and evaluated face-to-face by this provider.  She is denying suicidal or homicidal ideations.  Reports worsening depression.  States she has been struggling with depressive symptoms for many years.  States recently had a follow-up visit at [facility] psychiat[facility] for medication management and therapy services.  Reports she has upcoming therapy appointment this week however is on able to recall the date or time.  Patient is currently prescribed Prozac 40 mg p.o. twice daily and Seroquel 37.5 mg nightly.  Patient does report intermittent auditory hallucinations however is very adamant that she has not been hallucinating recently.  States she currently resides with her stepdaughter who is 34+ years old.  She provided verbal permission to follow-up with her stepdaughter for additional collateral.  It was reported that patient to be transferred to a skilled nursing facility.  Patient reports some anxiety related to being transferred and nobody understanding her current situation regarding her increased anxiety and worsening  depression.  Consider increasing Seroquel 37.5 mg to 50 mg nightly continue to monitor EKG for QT prolongation.  Patient was receptive to plan.  Case staffed with attending psychiatrist MD Lucianne Muss.  Support,encouragement and reassurance was provided.  During evaluation [name] is sitting resting in bed in no acute distress. She is alert/oriented x 3; calm/cooperative; and mood congruent with affect.  Patient appears mildly anxious throughout this assessment.  Ongoing ruminations related to multiple psychosocial stressors.  Patient slightly pressured however she attributes her rapid speech to Parkinson's diagnoses.  She is speaking in a clear tone at moderate volume, and pressure rapid pace at times throughout this assessment; with good eye contact. Her thought process is coherent and relevant; There is no indication that she is currently responding to internal/external stimuli or experiencing delusional thought content; and she has denied suicidal/self-harm/homicidal ideation, psychosis, and paranoia. Patient has remained calm throughout assessment and has answered questions appropriately.    At this time Patricia Fitzpatrick is educated and verbalizes understanding of mental health resources and other crisis services in the community.She is instructed to call 911 and present to the nearest emergency room should she experience any suicidal/homicidal ideation, auditory/visual/hallucinations, or detrimental worsening of her mental health condition. She was a also advised by Clinical research associate that she could call the toll-free phone on insurance card to assist with identifying in network counselors and agencies or number on back of Medicaid card t speak with care coordinator   HPI:  Per admission assessment note:   Past Psychiatric History:   Risk to Self:   Risk to Others:   Prior Inpatient Therapy:   Prior Outpatient Therapy:    Past Medical History:  Past Medical History:  Diagnosis Date  Anxiety    Cataracts,  bilateral    Depression    High cholesterol    Hypertension    OCD (obsessive compulsive disorder)    Parkinsonism 10/14/2015   Tremor     Past Surgical History:  Procedure Laterality Date   BREAST BIOPSY  1981   CATARACT EXTRACTION Right 2017   EYE SURGERY  1952   HIP ARTHROPLASTY Left 08/23/2020   Procedure: ARTHROPLASTY BIPOLAR HIP (HEMIARTHROPLASTY);  Surgeon: Bjorn Pippin, MD;  Location: [facility];  Service: Orthopedics;  Laterality: Left;   Family History:  Family History  Problem Relation Age of Onset   Pulmonary disease Mother    Alzheimer's disease Mother    Heart disease Father    Alzheimer's disease Father    Parkinson's disease Neg Hx    Family Psychiatric  History:  Social History:  Social History   Substance and Sexual Activity  Alcohol Use Not Currently   Comment: One glass per year. update 04/21/21 pt can't remember her last drink     Social History   Substance and Sexual Activity  Drug Use No    Social History   Socioeconomic History   Marital status: Widowed    Spouse name: Not on file   Number of children: 0   Years of education: Bachelors   Highest education level: Not on file  Occupational History   Occupation: Retired  Tobacco Use   Smoking status: Never   Smokeless tobacco: Never  Substance and Sexual Activity   Alcohol use: Not Currently    Comment: One glass per year. update 04/21/21 pt can't remember her last drink   Drug use: No   Sexual activity: Not on file  Other Topics Concern   Not on file  Social History Narrative   Lives at home, with adult stepdaughter, and brother.   Right-handed.   No caffeine use.   Social Determinants of Health   Financial Resource Strain: Not on file  Food Insecurity: No Food Insecurity (08/31/2022)   Hunger Vital Sign    Worried About Running Out of Food in the Last Year: Never true    Ran Out of Food in the Last Year: Never true  Transportation Needs: No Transportation Needs (08/31/2022)   PRAPARE -  Administrator, Civil Service (Medical): No    Lack of Transportation (Non-Medical): No  Physical Activity: Not on file  Stress: Not on file  Social Connections: Not on file   Additional Social History:    Allergies:  No Known Allergies  Labs:  Results for orders placed or performed during the hospital encounter of 08/30/22 (from the past 48 hour(s))  Basic metabolic panel     Status: Abnormal   Collection Time: 09/02/22 12:50 AM  Result Value Ref Range   Sodium 138 135 - 145 mmol/L   Potassium 3.9 3.5 - 5.1 mmol/L   Chloride 104 98 - 111 mmol/L   CO2 20 (L) 22 - 32 mmol/L   Glucose, Bld 88 70 - 99 mg/dL    Comment: Glucose reference range applies only to samples taken after fasting for at least 8 hours.   BUN 16 8 - 23 mg/dL   Creatinine, Ser 9.56 0.44 - 1.00 mg/dL   Calcium 8.2 (L) 8.9 - 10.3 mg/dL   GFR, Estimated >21 >30 mL/min    Comment: (NOTE) Calculated using the CKD-EPI Creatinine Equation (2021)    Anion gap 14 5 - 15    Comment: Performed at [facility]  [facility], [address], [place], [place] [address]  CBC     Status: None   Collection Time: 09/02/22 12:50 AM  Result Value Ref Range   WBC 5.4 4.0 - 10.5 K/uL   RBC 3.93 3.87 - 5.11 MIL/uL   Hemoglobin 12.4 12.0 - 15.0 g/dL   HCT 60.4 54.0 - 98.1 %   MCV 91.6 80.0 - 100.0 fL   MCH 31.6 26.0 - 34.0 pg   MCHC 34.4 30.0 - 36.0 g/dL   RDW 19.1 47.8 - 29.5 %   Platelets 199 150 - 400 K/uL    Comment: REPEATED TO VERIFY   nRBC 0.0 0.0 - 0.2 %    Comment: Performed at [facility], [address], [place], [place] [address]  Magnesium     Status: None   Collection Time: 09/02/22 12:50 AM  Result Value Ref Range   Magnesium 1.7 1.7 - 2.4 mg/dL    Comment: Performed at [facility], [address], [place], [place] [address]  Folate     Status: None   Collection Time: 09/02/22  3:46 AM  Result Value Ref Range   Folate 18.0 >5.9 ng/mL    Comment: Performed at [facility],  [address] [place], [place] [address]  Basic metabolic panel     Status: Abnormal   Collection Time: 09/03/22  3:17 AM  Result Value Ref Range   Sodium 137 135 - 145 mmol/L   Potassium 3.9 3.5 - 5.1 mmol/L   Chloride 108 98 - 111 mmol/L   CO2 25 22 - 32 mmol/L   Glucose, Bld 94 70 - 99 mg/dL    Comment: Glucose reference range applies only to samples taken after fasting for at least 8 hours.   BUN 18 8 - 23 mg/dL   Creatinine, Ser 9.52 0.44 - 1.00 mg/dL   Calcium 8.2 (L) 8.9 - 10.3 mg/dL   GFR, Estimated >84 >13 mL/min    Comment: (NOTE) Calculated using the CKD-EPI Creatinine Equation (2021)    Anion gap 4 (L) 5 - 15    Comment: Performed at [facility], [address], [place], [place] [address]  CBC     Status: None   Collection Time: 09/03/22  3:17 AM  Result Value Ref Range   WBC 5.6 4.0 - 10.5 K/uL   RBC 3.91 3.87 - 5.11 MIL/uL   Hemoglobin 12.0 12.0 - 15.0 g/dL   HCT 02.7 25.3 - 66.4 %   MCV 94.4 80.0 - 100.0 fL   MCH 30.7 26.0 - 34.0 pg   MCHC 32.5 30.0 - 36.0 g/dL   RDW 40.3 47.4 - 25.9 %   Platelets 235 150 - 400 K/uL   nRBC 0.0 0.0 - 0.2 %    Comment: Performed at [facility], [address] [place], [place] [address]  Magnesium     Status: None   Collection Time: 09/03/22  3:17 AM  Result Value Ref Range   Magnesium 1.8 1.7 - 2.4 mg/dL    Comment: Performed at [facility], [address] [place], [place] [address]    Current Facility-Administered Medications  Medication Dose Route Frequency Provider Last Rate Last Admin   0.9 %  sodium chloride infusion   Intravenous Continuous Danford, Earl Lites, MD   Stopped at 09/03/22 1230   acetaminophen (TYLENOL) tablet 1,000 mg  1,000 mg Oral TID Alberteen Sam, MD   1,000  mg at 09/03/22 0829   amLODipine (NORVASC) tablet 5 mg  5 mg Oral Daily Berton Mount I, MD   5 mg at 09/03/22 1202   carbidopa-levodopa (SINEMET IR) 25-100 MG per tablet immediate release 1.5 tablet  1.5 tablet  Oral TID WC Danford, Earl Lites, MD   1.5 tablet at 09/03/22 1202   docusate sodium (COLACE) capsule 100 mg  100 mg Oral BID Berton Mount I, MD   100 mg at 09/03/22 1202   enoxaparin (LOVENOX) injection 30 mg  30 mg Subcutaneous Q24H Alberteen Sam, MD   30 mg at 09/03/22 0829   FLUoxetine (PROZAC) capsule 40 mg  40 mg Oral BID Alberteen Sam, MD   40 mg at 09/03/22 0830   hydrALAZINE (APRESOLINE) tablet 25 mg  25 mg Oral Q6H PRN Barnetta Chapel, MD       methocarbamol (ROBAXIN) tablet 500 mg  500 mg Oral Q8H PRN Alberteen Sam, MD   500 mg at 09/02/22 2121   morphine (PF) 2 MG/ML injection 4 mg  4 mg Intravenous Q4H PRN Danford, Earl Lites, MD       OLANZapine (ZYPREXA) injection 5 mg  5 mg Intramuscular Q6H PRN Amin, Ankit Chirag, MD       ondansetron (ZOFRAN) tablet 4 mg  4 mg Oral Q6H PRN Danford, Earl Lites, MD       Or   ondansetron (ZOFRAN) injection 4 mg  4 mg Intravenous Q6H PRN Danford, Earl Lites, MD       oxyCODONE (Oxy IR/ROXICODONE) immediate release tablet 5 mg  5 mg Oral Q4H PRN Alberteen Sam, MD   5 mg at 09/03/22 0829   polyethylene glycol (MIRALAX / GLYCOLAX) packet 17 g  17 g Oral Daily Berton Mount I, MD   17 g at 09/03/22 1202   QUEtiapine (SEROQUEL) tablet 37.5 mg  37.5 mg Oral QHS Alberteen Sam, MD   37.5 mg at 09/02/22 2118   thiamine (VITAMIN B1) 500 mg in sodium chloride 0.9 % 50 mL IVPB  500 mg Intravenous Q24H Dimple Nanas, MD   Stopped at 09/02/22 1901   Current Outpatient Medications  Medication Sig Dispense Refill   B Complex Vitamins (B COMPLEX PO) Take 1 tablet by mouth daily.     carbidopa-levodopa (SINEMET IR) 25-100 MG tablet Take 1.5 tablets by mouth 3 (three) times daily. 405 tablet 4   Cholecalciferol (VITAMIN D3 PO) Take 50 mcg by mouth daily.     denosumab (PROLIA) 60 MG/ML SOSY injection Inject 60 mg into the skin every 6 (six) months.     FLUoxetine (PROZAC) 40 MG capsule Take  40 mg by mouth 2 (two) times daily.     thiamine (VITAMIN B1) 100 MG tablet Take 1 tablet (100 mg total) by mouth daily. 30 tablet 0   vitamin C (ASCORBIC ACID) 500 MG tablet Take 500 mg by mouth daily.     acetaminophen (TYLENOL) 500 MG tablet Take 2 tablets (1,000 mg total) by mouth 3 (three) times daily. 30 tablet 0   amLODipine (NORVASC) 5 MG tablet Take 1 tablet (5 mg total) by mouth daily. 30 tablet 1   docusate sodium (COLACE) 100 MG capsule Take 1 capsule (100 mg total) by mouth 2 (two) times daily. 10 capsule 0   [START ON 09/04/2022] enoxaparin (LOVENOX) 30 MG/0.3ML injection Inject 0.3 mLs (30 mg total) into the skin daily.     hydrALAZINE (APRESOLINE) 25 MG tablet Take 1 tablet (  25 mg total) by mouth every 6 (six) hours as needed (For SBP greater than 170 mmHg). 50 tablet 0   methocarbamol (ROBAXIN) 500 MG tablet Take 1 tablet (500 mg total) by mouth every 8 (eight) hours as needed for muscle spasms. 30 tablet 0   oxyCODONE (OXY IR/ROXICODONE) 5 MG immediate release tablet Take 1 tablet (5 mg total) by mouth every 4 (four) hours as needed for severe pain. 30 tablet 0   polyethylene glycol (MIRALAX / GLYCOLAX) 17 g packet Take 17 g by mouth daily. 14 each 0   QUEtiapine (SEROQUEL) 25 MG tablet Take 1.5 tablets (37.5 mg total) by mouth at bedtime. 30 tablet 0    Musculoskeletal: Patient was resting in bed     Psychiatric Specialty Exam:  Presentation  General Appearance: Disheveled  Eye Contact:Good  Speech:Clear and Coherent  Speech Volume:Normal  Handedness:Right   Mood and Affect  Mood:Anxious; Depressed  Affect:Congruent   Thought Process  Thought Processes:Coherent  Descriptions of Associations:Intact  Orientation:Full (Time, Place and Person)  Thought Content:Logical  History of Schizophrenia/Schizoaffective disorder:No data recorded Duration of Psychotic Symptoms:No data recorded Hallucinations:Hallucinations: Auditory Description of Auditory  Hallucinations: reported hearing voice 'soft whispers." " i am not crazy."  Ideas of Reference:None  Suicidal Thoughts:Suicidal Thoughts: No  Homicidal Thoughts:Homicidal Thoughts: No   Sensorium  Memory:Immediate Fair; Recent Fair  Judgment:Fair  Insight:Fair   Executive Functions  Concentration:Fair  Attention Span:Good  Recall:Fair  Fund of Knowledge:Fair  Language:Fair   Psychomotor Activity  Psychomotor Activity:No data recorded  Assets  Assets:Vocational/Educational; Physical Health; Desire for Improvement   Sleep  Sleep:Sleep: Fair   Physical Exam: Physical Exam Vitals and nursing note reviewed.  Constitutional:      Appearance: Normal appearance.  Psychiatric:        Mood and Affect: Mood normal.        Behavior: Behavior normal.        Thought Content: Thought content normal.        Judgment: Judgment normal.    Review of Systems  Psychiatric/Behavioral:  Positive for depression. Negative for suicidal ideas. The patient is nervous/anxious.   All other systems reviewed and are negative.  Blood pressure (!) 175/95, pulse 83, temperature 99.1 F (37.3 C), temperature source Oral, resp. rate 18, height 5\' 2"  (1.575 m), weight 44 kg, SpO2 96%. Body mass index is 17.74 kg/m.  Treatment Plan Summary: Daily contact with patient to assess and evaluate symptoms and progress in treatment and Medication management -Continue Prozac 40 mg po BID and continue titration with Seroquel 37.5 mg to 50 mg nightly. -Continue to monitor EKG for QTc prolongation when titrating Seroquel -patient to be transferred to SNF  Disposition: No evidence of imminent risk to self or others at present.   Patient does not meet criteria for psychiatric inpatient admission. Supportive therapy provided about ongoing stressors. Refer to IOP. Discussed crisis plan, support from social network, calling 911, coming to the Emergency Department, and calling Suicide Hotline.  Oneta Rack, NP 09/03/2022 3:54 PM

## 2022-09-03 NOTE — Discharge Summary (Signed)
Physician Discharge Summary  Patient ID: Patricia Fitzpatrick MRN: [id number] DOB/AGE: Jul 15, 1945 77 y.o.  Admit date: 08/30/2022 Discharge date: 09/03/2022  Admission Diagnoses:  Discharge Diagnoses:  Principal Problem:   Closed compression fracture of L1 lumbar vertebra, initial encounter [facility]) Active Problems:   Parkinson's disease   Closed displaced fracture of greater trochanter of left femur (HCC)   Hypertension   Depression   Fall at home, subsequent encounter   Pubic ramus fracture, left, with routine healing, subsequent encounter   Dehydration   Discharged Condition:   Hospital Course:  Patient is a 77 year old with history of Parkinson's disease and hypertension.  Patient had a mechanical fall few days preceding admission and was found to have pubic ramus fracture, and patient was eventually discharged home from ED.  After 2 days at home, patient continued to feel extremely weak and could not stand up or ambulate herself.  She was also quite dehydrated.  She was admitted back again in the hospital and imaging showed L1 compression fracture and greater trochanteric fracture.  Case was discussed by EDP with orthopedic and neurosurgery who both recommended nonoperative management.  Weightbearing as tolerated.  PT OT eventually recommended SNF.  On 08/31/2022, patient had acute change in mental status and concerns for unequal pupil.  Patient underwent CT head, CTA head and neck and MRI brain which were all negative.  EEG was also unremarkable.  Patient was seen by neurology team.  Orthopedic and Neurology teams assisted in directing patient's management during the hospital sty.    Closed compression fracture of L1 lumbar vertebra, initial encounter (HCC) Pubic ramus fracture on the left with routine healing Closed displaced fracture of greater trochanter of left femur (HCC) - EDP discussed case with neurosurgery Dr. Dutch Quint and orthopedic Dr. [name] who both recommended conservative management,  no surgical need.  Weightbearing as tolerated.  TLSO brace to help with pain.    Acute agitation with unequal pupil -Overnight on 08/31/2022, code stroke was called.  Seen by neurology.  CT head, MRI brain, CTA head and neck and EEG were all unremarkable.  UA is negative.  Zyprexa as needed for agitation once the EKG is performed   Dehydration IV fluids   Fall at home, subsequent encounter -PT/OT.  SNF   Depression - Continue fluoxetine and Seroquel   Hypertension BP elevated due to pain. Not on home meds.   Parkinson's disease - Continue Sinemet, mirapex  Consults: neurology and orthopedic surgery  Significant Diagnostic Studies:  MRI Brain without contrast revealed: 1. No acute intracranial abnormality. 2. Findings of chronic small vessel ischemia.  Pelvic X Ray revealed: Previous left hip arthroplasty. There is a new transverse fracture in the upper aspect of greater trochanter of proximal left femur with 3-4 mm distraction of fracture fragments.   New deformities are seen in left inferior pubic ramus and medial left acetabulum without definite radiolucent fracture line. Age of these fractures is indeterminate. These may be recent or old.   Old fracture is seen in right pubic bone with no interval change.  CT abdomen and pelvis with and without contrast revealed: 1. No suspicious renal mass. No focal lesion in the right kidney, which corresponds to the observations described on the prior ultrasound exam. Findings on prior ultrasound therefore favored normal renal parenchyma. 2. Multiple other nonacute observations, as described above.   Aortic Atherosclerosis.  CT lumbar and thoracic spine without contrast revealed: *Acute to subacute appearing diffuse fracture involving the L1 vertebral body without significant loss  of vertebral body height at this time. About 5 mm retropulsion of the upper vertebral body into the canal but no significant canal  stenosis. *Moderate superior endplate deformity at L3 of uncertain acuity but new compared to 2020. *Aortic atherosclerosis.  Renal ultrasound revealed: 1. Markedly limited exam. 2. Mild-to-moderate left hydronephrosis. 3. Apparent isoechoic area in the right kidney   Discharge Exam: Blood pressure (!) 181/91, pulse 68, temperature 98.8 F (37.1 C), temperature source Oral, resp. rate 18, height 5\' 2"  (1.575 m), weight 44 kg, SpO2 100%.   Disposition: Discharge disposition: 03-Skilled Nursing Facility       Discharge Instructions     Diet - low sodium heart healthy   Complete by: As directed    Increase activity slowly   Complete by: As directed       Allergies as of 09/03/2022   No Known Allergies      Medication List     STOP taking these medications    HYDROcodone-acetaminophen 5-325 MG tablet Commonly known as: NORCO/VICODIN   MELOXICAM PO       TAKE these medications    acetaminophen 500 MG tablet Commonly known as: TYLENOL Take 2 tablets (1,000 mg total) by mouth 3 (three) times daily. What changed:  how much to take when to take this reasons to take this additional instructions   amLODipine 5 MG tablet Commonly known as: NORVASC Take 1 tablet (5 mg total) by mouth daily.   ascorbic acid 500 MG tablet Commonly known as: VITAMIN C Take 500 mg by mouth daily.   B COMPLEX PO Take 1 tablet by mouth daily.   carbidopa-levodopa 25-100 MG tablet Commonly known as: SINEMET IR Take 1.5 tablets by mouth 3 (three) times daily.   docusate sodium 100 MG capsule Commonly known as: COLACE Take 1 capsule (100 mg total) by mouth 2 (two) times daily.   enoxaparin 30 MG/0.3ML injection Commonly known as: LOVENOX Inject 0.3 mLs (30 mg total) into the skin daily. Start taking on: September 04, 2022   FLUoxetine 40 MG capsule Commonly known as: PROZAC Take 40 mg by mouth 2 (two) times daily.   hydrALAZINE 25 MG tablet Commonly known as:  APRESOLINE Take 1 tablet (25 mg total) by mouth every 6 (six) hours as needed (For SBP greater than 170 mmHg).   methocarbamol 500 MG tablet Commonly known as: ROBAXIN Take 1 tablet (500 mg total) by mouth every 8 (eight) hours as needed for muscle spasms.   oxyCODONE 5 MG immediate release tablet Commonly known as: Oxy IR/ROXICODONE Take 1 tablet (5 mg total) by mouth every 4 (four) hours as needed for severe pain.   polyethylene glycol 17 g packet Commonly known as: MIRALAX / GLYCOLAX Take 17 g by mouth daily.   Prolia 60 MG/ML Sosy injection Generic drug: denosumab Inject 60 mg into the skin every 6 (six) months.   QUEtiapine 25 MG tablet Commonly known as: SEROQUEL Take 1.5 tablets (37.5 mg total) by mouth at bedtime. What changed: additional instructions   thiamine 100 MG tablet Commonly known as: VITAMIN B1 Take 1 tablet (100 mg total) by mouth daily.   VITAMIN D3 PO Take 50 mcg by mouth daily.        Follow-up Information     Bjorn Pippin, MD. Schedule an appointment as soon as possible for a visit in 2 week(s).   Specialty: Orthopedic Surgery Contact information: [address] [place] [address] [phone]  Time spent: 40 Minutes.  SignedBarnetta Chapel 09/03/2022, 10:51 AM

## 2022-09-03 NOTE — TOC Transition Note (Addendum)
Transition of Care [facility]) - CM/SW Progression Note   Patient Details  Name: Patricia Fitzpatrick MRN: [id number] Date of Birth: Jul 16, 1945  Transition [facility] Care [facility]) CM/SW Contact:  Baldemar Lenis, LCSW Phone Number: 09/03/2022, 12:34 PM   Clinical Narrative:   CSW updated that [facility] can offer a bed for patient. CSW submitted an received insurance authorization for patient to admit to SNF when stable. CSW attempted to speak with patient, but RN reported patient still intermittently confused. CSW contacted patient's step-daughter, [name], to discuss consents for SNF and who would complete. Per [name], the patient's brother isn't any better than the patient in terms of mental status, so [name] will assist with paperwork. CSW updated [facility] to reach out to [place] to discuss. CSW to follow.    Final next level of care: Skilled Nursing Facility Barriers to Discharge: Barriers Resolved   Patient Goals and CMS Choice CMS Medicare.gov Compare Post Acute Care list provided to:: Patient Choice offered to / list presented to : Patient  Discharge Placement                Patient chooses bed at: [facility] Patient to be transferred to facility by: [facility] Name of family member notified: [name] Patient and family notified of of transfer: 09/03/22  Discharge Plan and Services Additional resources added to the After Visit Summary for       Post Acute Care Choice: Skilled Nursing Facility                               Social Determinants of Health (SDOH) Interventions SDOH Screenings   Food Insecurity: No Food Insecurity (08/31/2022)  Housing: Low Risk  (08/31/2022)  Transportation Needs: No Transportation Needs (08/31/2022)  Utilities: Not At Risk (08/31/2022)  Depression (PHQ2-9): High Risk (08/17/2022)  Tobacco Use: Low Risk  (08/31/2022)     Readmission Risk Interventions     No data to display

## 2022-09-03 NOTE — Significant Event (Signed)
Bladder scanned at 0330am shows 115cc.

## 2022-09-03 NOTE — TOC Transition Note (Signed)
Transition of Care [facility]) - CM/SW Discharge Note   Patient Details  Name: Patricia Fitzpatrick MRN: [id number] Date of Birth: 03-Mar-1945  Transition of Care [facility]) CM/SW Contact:  Baldemar Lenis, LCSW Phone Number: 09/03/2022, 12:37 PM   Clinical Narrative:   CSW updated by MD that patient is stable for DC to SNF today. CSW sent discharge summary to [facility], confirmed bed is available. CSW updated stepdaughter [name] by phone, she is in agreement. Transport arranged with [facility] for next available.  Nurse to call report to [phone].    Final next level of care: Skilled Nursing Facility Barriers to Discharge: Barriers Resolved   Patient Goals and CMS Choice CMS Medicare.gov Compare Post Acute Care list provided to:: Patient Choice offered to / list presented to : Patient  Discharge Placement                Patient chooses bed at: [facility] Patient to be transferred to facility by: [facility] Name of family member notified: [name] Patient and family notified of of transfer: 09/03/22  Discharge Plan and Services Additional resources added to the After Visit Summary for       Post Acute Care Choice: Skilled Nursing Facility                               Social Determinants of Health (SDOH) Interventions SDOH Screenings   Food Insecurity: No Food Insecurity (08/31/2022)  Housing: Low Risk  (08/31/2022)  Transportation Needs: No Transportation Needs (08/31/2022)  Utilities: Not At Risk (08/31/2022)  Depression (PHQ2-9): High Risk (08/17/2022)  Tobacco Use: Low Risk  (08/31/2022)     Readmission Risk Interventions     No data to display

## 2022-09-06 ENCOUNTER — Ambulatory Visit: Payer: Medicare PPO | Admitting: Adult Health

## 2022-09-06 DIAGNOSIS — R2689 Other abnormalities of gait and mobility: Secondary | ICD-10-CM | POA: Diagnosis not present

## 2022-09-06 DIAGNOSIS — M6281 Muscle weakness (generalized): Secondary | ICD-10-CM | POA: Diagnosis not present

## 2022-09-06 DIAGNOSIS — S72002D Fracture of unspecified part of neck of left femur, subsequent encounter for closed fracture with routine healing: Secondary | ICD-10-CM | POA: Diagnosis not present

## 2022-09-06 DIAGNOSIS — R2681 Unsteadiness on feet: Secondary | ICD-10-CM | POA: Diagnosis not present

## 2022-09-06 DIAGNOSIS — S32018D Other fracture of first lumbar vertebra, subsequent encounter for fracture with routine healing: Secondary | ICD-10-CM | POA: Diagnosis not present

## 2022-09-06 DIAGNOSIS — Z9181 History of falling: Secondary | ICD-10-CM | POA: Diagnosis not present

## 2022-09-06 DIAGNOSIS — R41841 Cognitive communication deficit: Secondary | ICD-10-CM | POA: Diagnosis not present

## 2022-09-07 ENCOUNTER — Ambulatory Visit: Payer: Medicare PPO | Admitting: Professional Counselor

## 2022-09-07 DIAGNOSIS — G20A1 Parkinson's disease without dyskinesia, without mention of fluctuations: Secondary | ICD-10-CM | POA: Diagnosis not present

## 2022-09-09 DIAGNOSIS — S32018D Other fracture of first lumbar vertebra, subsequent encounter for fracture with routine healing: Secondary | ICD-10-CM | POA: Diagnosis not present

## 2022-09-09 DIAGNOSIS — R2681 Unsteadiness on feet: Secondary | ICD-10-CM | POA: Diagnosis not present

## 2022-09-09 DIAGNOSIS — R41841 Cognitive communication deficit: Secondary | ICD-10-CM | POA: Diagnosis not present

## 2022-09-09 DIAGNOSIS — Z9181 History of falling: Secondary | ICD-10-CM | POA: Diagnosis not present

## 2022-09-09 DIAGNOSIS — R2689 Other abnormalities of gait and mobility: Secondary | ICD-10-CM | POA: Diagnosis not present

## 2022-09-09 DIAGNOSIS — S72002D Fracture of unspecified part of neck of left femur, subsequent encounter for closed fracture with routine healing: Secondary | ICD-10-CM | POA: Diagnosis not present

## 2022-09-09 DIAGNOSIS — M6281 Muscle weakness (generalized): Secondary | ICD-10-CM | POA: Diagnosis not present

## 2022-09-10 DIAGNOSIS — I10 Essential (primary) hypertension: Secondary | ICD-10-CM | POA: Diagnosis not present

## 2022-09-10 DIAGNOSIS — G20A1 Parkinson's disease without dyskinesia, without mention of fluctuations: Secondary | ICD-10-CM | POA: Diagnosis not present

## 2022-09-10 DIAGNOSIS — F419 Anxiety disorder, unspecified: Secondary | ICD-10-CM | POA: Diagnosis not present

## 2022-09-10 DIAGNOSIS — S32010D Wedge compression fracture of first lumbar vertebra, subsequent encounter for fracture with routine healing: Secondary | ICD-10-CM | POA: Diagnosis not present

## 2022-09-13 DIAGNOSIS — S32018D Other fracture of first lumbar vertebra, subsequent encounter for fracture with routine healing: Secondary | ICD-10-CM | POA: Diagnosis not present

## 2022-09-13 DIAGNOSIS — S32010D Wedge compression fracture of first lumbar vertebra, subsequent encounter for fracture with routine healing: Secondary | ICD-10-CM | POA: Diagnosis not present

## 2022-09-13 DIAGNOSIS — R2689 Other abnormalities of gait and mobility: Secondary | ICD-10-CM | POA: Diagnosis not present

## 2022-09-13 DIAGNOSIS — I10 Essential (primary) hypertension: Secondary | ICD-10-CM | POA: Diagnosis not present

## 2022-09-13 DIAGNOSIS — M6281 Muscle weakness (generalized): Secondary | ICD-10-CM | POA: Diagnosis not present

## 2022-09-13 DIAGNOSIS — S72002D Fracture of unspecified part of neck of left femur, subsequent encounter for closed fracture with routine healing: Secondary | ICD-10-CM | POA: Diagnosis not present

## 2022-09-13 DIAGNOSIS — Z9181 History of falling: Secondary | ICD-10-CM | POA: Diagnosis not present

## 2022-09-13 DIAGNOSIS — R41841 Cognitive communication deficit: Secondary | ICD-10-CM | POA: Diagnosis not present

## 2022-09-13 DIAGNOSIS — R2681 Unsteadiness on feet: Secondary | ICD-10-CM | POA: Diagnosis not present

## 2022-09-14 ENCOUNTER — Encounter: Payer: Self-pay | Admitting: Professional Counselor

## 2022-09-14 ENCOUNTER — Telehealth: Payer: Self-pay | Admitting: Professional Counselor

## 2022-09-14 DIAGNOSIS — F419 Anxiety disorder, unspecified: Secondary | ICD-10-CM | POA: Diagnosis not present

## 2022-09-14 DIAGNOSIS — G20A1 Parkinson's disease without dyskinesia, without mention of fluctuations: Secondary | ICD-10-CM | POA: Diagnosis not present

## 2022-09-14 DIAGNOSIS — F331 Major depressive disorder, recurrent, moderate: Secondary | ICD-10-CM

## 2022-09-14 DIAGNOSIS — F411 Generalized anxiety disorder: Secondary | ICD-10-CM

## 2022-09-14 DIAGNOSIS — R634 Abnormal weight loss: Secondary | ICD-10-CM | POA: Diagnosis not present

## 2022-09-14 NOTE — Telephone Encounter (Signed)
  Pt called & left a vm w admin for [facility] to call her. [facility] reached pt by pt cell. Pt identified being at [facility] in [place], having fallen, broken a vertabrae, while at home; then hospitalized, then in rehab. Pt voiced feeling depressed, sad, worried, & unsure of perceptions, stating not happy w current care, while noting that she had good experience prior. [facility] affirmed & reassured pt. [facility] urged pt to discuss med management at rehab &/or w PCP, & to contact rehab MSW; stepdtr advocating.

## 2022-09-16 DIAGNOSIS — R2689 Other abnormalities of gait and mobility: Secondary | ICD-10-CM | POA: Diagnosis not present

## 2022-09-16 DIAGNOSIS — R41841 Cognitive communication deficit: Secondary | ICD-10-CM | POA: Diagnosis not present

## 2022-09-16 DIAGNOSIS — M6281 Muscle weakness (generalized): Secondary | ICD-10-CM | POA: Diagnosis not present

## 2022-09-16 DIAGNOSIS — Z9181 History of falling: Secondary | ICD-10-CM | POA: Diagnosis not present

## 2022-09-16 DIAGNOSIS — R2681 Unsteadiness on feet: Secondary | ICD-10-CM | POA: Diagnosis not present

## 2022-09-16 DIAGNOSIS — S72002D Fracture of unspecified part of neck of left femur, subsequent encounter for closed fracture with routine healing: Secondary | ICD-10-CM | POA: Diagnosis not present

## 2022-09-16 DIAGNOSIS — S32018D Other fracture of first lumbar vertebra, subsequent encounter for fracture with routine healing: Secondary | ICD-10-CM | POA: Diagnosis not present

## 2022-09-17 DIAGNOSIS — G20B1 Parkinson's disease with dyskinesia, without mention of fluctuations: Secondary | ICD-10-CM | POA: Diagnosis not present

## 2022-09-17 DIAGNOSIS — F060 Psychotic disorder with hallucinations due to known physiological condition: Secondary | ICD-10-CM | POA: Diagnosis not present

## 2022-09-17 DIAGNOSIS — F411 Generalized anxiety disorder: Secondary | ICD-10-CM | POA: Diagnosis not present

## 2022-09-17 DIAGNOSIS — F331 Major depressive disorder, recurrent, moderate: Secondary | ICD-10-CM | POA: Diagnosis not present

## 2022-09-20 DIAGNOSIS — R2689 Other abnormalities of gait and mobility: Secondary | ICD-10-CM | POA: Diagnosis not present

## 2022-09-20 DIAGNOSIS — Z9181 History of falling: Secondary | ICD-10-CM | POA: Diagnosis not present

## 2022-09-20 DIAGNOSIS — R41841 Cognitive communication deficit: Secondary | ICD-10-CM | POA: Diagnosis not present

## 2022-09-20 DIAGNOSIS — S32018D Other fracture of first lumbar vertebra, subsequent encounter for fracture with routine healing: Secondary | ICD-10-CM | POA: Diagnosis not present

## 2022-09-20 DIAGNOSIS — G20A1 Parkinson's disease without dyskinesia, without mention of fluctuations: Secondary | ICD-10-CM | POA: Diagnosis not present

## 2022-09-20 DIAGNOSIS — S32010D Wedge compression fracture of first lumbar vertebra, subsequent encounter for fracture with routine healing: Secondary | ICD-10-CM | POA: Diagnosis not present

## 2022-09-20 DIAGNOSIS — R2681 Unsteadiness on feet: Secondary | ICD-10-CM | POA: Diagnosis not present

## 2022-09-20 DIAGNOSIS — S72002D Fracture of unspecified part of neck of left femur, subsequent encounter for closed fracture with routine healing: Secondary | ICD-10-CM | POA: Diagnosis not present

## 2022-09-20 DIAGNOSIS — I10 Essential (primary) hypertension: Secondary | ICD-10-CM | POA: Diagnosis not present

## 2022-09-20 DIAGNOSIS — F419 Anxiety disorder, unspecified: Secondary | ICD-10-CM | POA: Diagnosis not present

## 2022-09-20 DIAGNOSIS — M6281 Muscle weakness (generalized): Secondary | ICD-10-CM | POA: Diagnosis not present

## 2022-09-21 DIAGNOSIS — S72115D Nondisplaced fracture of greater trochanter of left femur, subsequent encounter for closed fracture with routine healing: Secondary | ICD-10-CM | POA: Diagnosis not present

## 2022-09-23 DIAGNOSIS — S72002D Fracture of unspecified part of neck of left femur, subsequent encounter for closed fracture with routine healing: Secondary | ICD-10-CM | POA: Diagnosis not present

## 2022-09-23 DIAGNOSIS — Z9181 History of falling: Secondary | ICD-10-CM | POA: Diagnosis not present

## 2022-09-23 DIAGNOSIS — R41841 Cognitive communication deficit: Secondary | ICD-10-CM | POA: Diagnosis not present

## 2022-09-23 DIAGNOSIS — S32018D Other fracture of first lumbar vertebra, subsequent encounter for fracture with routine healing: Secondary | ICD-10-CM | POA: Diagnosis not present

## 2022-09-23 DIAGNOSIS — R2681 Unsteadiness on feet: Secondary | ICD-10-CM | POA: Diagnosis not present

## 2022-09-23 DIAGNOSIS — R2689 Other abnormalities of gait and mobility: Secondary | ICD-10-CM | POA: Diagnosis not present

## 2022-09-23 DIAGNOSIS — M6281 Muscle weakness (generalized): Secondary | ICD-10-CM | POA: Diagnosis not present

## 2022-09-24 DIAGNOSIS — S32010D Wedge compression fracture of first lumbar vertebra, subsequent encounter for fracture with routine healing: Secondary | ICD-10-CM | POA: Diagnosis not present

## 2022-09-24 DIAGNOSIS — W06XXXA Fall from bed, initial encounter: Secondary | ICD-10-CM | POA: Diagnosis not present

## 2022-09-27 ENCOUNTER — Ambulatory Visit (INDEPENDENT_AMBULATORY_CARE_PROVIDER_SITE_OTHER): Payer: Medicare PPO | Admitting: Adult Health

## 2022-09-27 ENCOUNTER — Telehealth: Payer: Self-pay | Admitting: Diagnostic Neuroimaging

## 2022-09-27 VITALS — BP 104/54 | HR 76 | Wt 104.0 lb

## 2022-09-27 DIAGNOSIS — F068 Other specified mental disorders due to known physiological condition: Secondary | ICD-10-CM

## 2022-09-27 DIAGNOSIS — F411 Generalized anxiety disorder: Secondary | ICD-10-CM

## 2022-09-27 DIAGNOSIS — F331 Major depressive disorder, recurrent, moderate: Secondary | ICD-10-CM | POA: Diagnosis not present

## 2022-09-27 DIAGNOSIS — G20A1 Parkinson's disease without dyskinesia, without mention of fluctuations: Secondary | ICD-10-CM | POA: Diagnosis not present

## 2022-09-27 DIAGNOSIS — F422 Mixed obsessional thoughts and acts: Secondary | ICD-10-CM | POA: Diagnosis not present

## 2022-09-27 DIAGNOSIS — F419 Anxiety disorder, unspecified: Secondary | ICD-10-CM | POA: Diagnosis not present

## 2022-09-27 DIAGNOSIS — I10 Essential (primary) hypertension: Secondary | ICD-10-CM | POA: Diagnosis not present

## 2022-09-27 DIAGNOSIS — S32010D Wedge compression fracture of first lumbar vertebra, subsequent encounter for fracture with routine healing: Secondary | ICD-10-CM | POA: Diagnosis not present

## 2022-09-27 NOTE — Progress Notes (Unsigned)
[facility] MD/PA/NP Initial Note  09/28/2022 9:28 AM Patricia Fitzpatrick  MRN:  [id number]  Chief Complaint:   HPI:   Patient seen today for initial psychiatric evaluation.   Previously seen by Dr. Evelene Croon. Will request records for review.  Accompanied by step daughter.  Describes mood today as "ok". Pleasant. Denies tearfulness. Mood symptoms - reports depression and sadness more so lately - a touch for years. Reports anxiety - most of the time. Reports irritability at times. Reports a history of OCD - more obsessive thoughts than acts. Reports irrational thoughts and fears. Reports worry, rumination and over thinking. Stating "I'm more with it than I was". Reporting she is about 70/30 most of the time. Reports falling at home - in her bathroom and was hospitalized and placed at [facility] for rehabilitation for 25 days. Stating "I did so good for so long". She is still unable to care for herself, but her family would like to try and take care of her at home for now.  Her brother and step-daughter [name] are planning to provide care for her.She continues to recover and feels like medications are helpful. She has a diagnosis of Parkinson's disease and is followed by [facility] neurology. Improved interest and motivation. Taking medications as prescribed.  Energy levels lower. Active, does not have a regular exercise routine.   Enjoys some usual interests and activities. Widowed. Lives with brother. Spending time with family. Appetite adequate. Weight stable. Sleeps better some nights than others - "it depends". Averages 6 to 7 hours of broken sleep. Reports daytime napping.  Focus and concentration difficulties. Completing tasks. Managing aspects of household.  Denies SI or HI.  Reports AH or VH - some benefit with Seroquel Reports AH and VH. Denies self harm. Denies substance use.  Previous medication trials: Prozac, others  Visit Diagnosis:    ICD-10-CM   1. Major depressive disorder, recurrent  episode, moderate (HCC)  F33.1     2. Generalized anxiety disorder  F41.1     3. Mixed obsessional thoughts and acts  F42.2     4. 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   Past Medical History:  Past Medical History:  Diagnosis Date   Anxiety    Cataracts, bilateral    Depression    High cholesterol    Hypertension    OCD (obsessive compulsive disorder)    Parkinsonism 10/14/2015   Tremor     Past Surgical History:  Procedure Laterality Date   BREAST BIOPSY  1981   CATARACT EXTRACTION Right 2017   EYE SURGERY  1952   HIP ARTHROPLASTY Left 08/23/2020   Procedure: ARTHROPLASTY BIPOLAR HIP (HEMIARTHROPLASTY);  Surgeon: Bjorn Pippin, MD;  Location: [facility] ORS;  Service: Orthopedics;  Laterality: Left;    Family Psychiatric History: Denies any family history of mental illness.   Family History:  Family History  Problem Relation Age of Onset   Pulmonary disease Mother    Alzheimer's disease Mother    Heart disease Father    Alzheimer's disease Father    Parkinson's disease Neg Hx     Social History:  Social History   Socioeconomic History   Marital status: Widowed    Spouse name: Not on file   Number of children: 0   Years of education: Bachelors   Highest education level: Not on file  Occupational History   Occupation: Retired  Tobacco Use  Smoking status: Never   Smokeless tobacco: Never  Substance and Sexual Activity   Alcohol use: Not Currently    Comment: One glass per year. update 04/21/21 pt can't remember her last drink   Drug use: No   Sexual activity: Not on file  Other Topics Concern   Not on file  Social History Narrative   Lives at home, with adult stepdaughter, and brother.   Right-handed.   No caffeine use.   Social Determinants of Health   Financial Resource Strain: Not on file  Food Insecurity: No Food Insecurity (08/31/2022)   Hunger Vital Sign    Worried  About Running Out of Food in the Last Year: Never true    Ran Out of Food in the Last Year: Never true  Transportation Needs: No Transportation Needs (08/31/2022)   PRAPARE - Administrator, Civil Service (Medical): No    Lack of Transportation (Non-Medical): No  Physical Activity: Not on file  Stress: Not on file  Social Connections: Not on file    Allergies: No Known Allergies  Metabolic Disorder Labs: No results found for: "HGBA1C", "MPG" No results found for: "PROLACTIN" No results found for: "CHOL", "TRIG", "HDL", "CHOLHDL", "VLDL", "LDLCALC" Lab Results  Component Value Date   TSH 0.608 09/01/2022   TSH 2.250 10/14/2015    Therapeutic Level Labs: No results found for: "LITHIUM" No results found for: "VALPROATE" No results found for: "CBMZ"  Current Medications: Current Outpatient Medications  Medication Sig Dispense Refill   acetaminophen (TYLENOL) 500 MG tablet Take 2 tablets (1,000 mg total) by mouth 3 (three) times daily. 30 tablet 0   amLODipine (NORVASC) 5 MG tablet Take 1 tablet (5 mg total) by mouth daily. 30 tablet 1   B Complex Vitamins (B COMPLEX PO) Take 1 tablet by mouth daily.     carbidopa-levodopa (SINEMET IR) 25-100 MG tablet Take 1.5 tablets by mouth 3 (three) times daily. 405 tablet 4   Cholecalciferol (VITAMIN D3 PO) Take 50 mcg by mouth daily.     denosumab (PROLIA) 60 MG/ML SOSY injection Inject 60 mg into the skin every 6 (six) months.     docusate sodium (COLACE) 100 MG capsule Take 1 capsule (100 mg total) by mouth 2 (two) times daily. 10 capsule 0   enoxaparin (LOVENOX) 30 MG/0.3ML injection Inject 0.3 mLs (30 mg total) into the skin daily.     FLUoxetine (PROZAC) 40 MG capsule Take 40 mg by mouth 2 (two) times daily.     hydrALAZINE (APRESOLINE) 25 MG tablet Take 1 tablet (25 mg total) by mouth every 6 (six) hours as needed (For SBP greater than 170 mmHg). 50 tablet 0   methocarbamol (ROBAXIN) 500 MG tablet Take 1 tablet (500 mg  total) by mouth every 8 (eight) hours as needed for muscle spasms. 30 tablet 0   oxyCODONE (OXY IR/ROXICODONE) 5 MG immediate release tablet Take 1 tablet (5 mg total) by mouth every 4 (four) hours as needed for severe pain. 30 tablet 0   polyethylene glycol (MIRALAX / GLYCOLAX) 17 g packet Take 17 g by mouth daily. 14 each 0   QUEtiapine (SEROQUEL) 25 MG tablet Take 1.5 tablets (37.5 mg total) by mouth at bedtime. 30 tablet 0   thiamine (VITAMIN B1) 100 MG tablet Take 1 tablet (100 mg total) by mouth daily. 30 tablet 0   vitamin C (ASCORBIC ACID) 500 MG tablet Take 500 mg by mouth daily.     No current facility-administered medications for  this visit.    Medication Side Effects: none  Orders placed this visit:  No orders of the defined types were placed in this encounter.   Psychiatric Specialty Exam:  Review of Systems  Musculoskeletal:  Negative for gait problem.  Neurological:  Negative for tremors.  Psychiatric/Behavioral:         Please refer to HPI    Blood pressure (!) 104/54, pulse 76, weight 104 lb (47.2 kg).Body mass index is 19.02 kg/m.  General Appearance: Casual and Neat  Eye Contact:  Good  Speech:  Clear and Coherent and Normal Rate  Volume:  Normal  Mood:  Anxious  Affect:  Appropriate and Congruent  Thought Process:  Coherent and Descriptions of Associations: Intact  Orientation:  Full (Time, Place, and Person)  Thought Content: Logical   Suicidal Thoughts:  No  Homicidal Thoughts:  No  Memory:  WNL  Judgement:  Fair  Insight:  Fair  Psychomotor Activity:  Normal  Concentration:  Concentration: Fair and Attention Span: Good  Recall:  Fair  Fund of Knowledge: Good  Language: Good  Assets:  Communication Skills Desire for Improvement Financial Resources/Insurance Housing Intimacy Leisure Time Physical Health Resilience Social Support Talents/Skills Transportation Vocational/Educational  ADL's:  Intact  Cognition: WNL  Prognosis:  Good    Screenings:  CAGE-AID    Flowsheet Row ED to Hosp-Admission (Discharged) from 08/30/2022 in [facility]  CAGE-AID Score 0      GAD-7    Flowsheet Row Counselor from 08/17/2022 in [facility]  Total GAD-7 Score 13      Mini-Mental    Flowsheet Row Office Visit from 10/22/2021 in [facility] Office Visit from 04/21/2021 in [facility] Office Visit from 08/05/2020 in [facility] Office Visit from 11/01/2019 in [facility] Office Visit from 06/22/2016 in [facility]  Total Score (max 30 points ) 26 27 26 25 29       PHQ2-9    Flowsheet Row Counselor from 08/17/2022 in [facility]  PHQ-2 Total Score 4  PHQ-9 Total Score 16      Flowsheet Row ED to Hosp-Admission (Discharged) from 08/30/2022 in [facility] ED from 06/02/2022 in [facility] Department at [facility] Medication Injection 15 from 01/12/2022 in [facility]  C-SSRS RISK CATEGORY No Risk No Risk No Risk       Receiving Psychotherapy: No   Treatment Plan/Recommendations:   Plan:  PDMP reviewed  Clonazepam 0.5mg  BID - discussed tapering down by 0.25mg  weekly. Prozac 40mg  BID  Seroquel 50mg  at hs - Neurology managing - note to consider Nuplazid for worsening psychosis.  RTC 4 weeks  Patient advised to contact office with any questions, adverse effects, or acute worsening in signs and symptoms.   Discussed potential benefits, risk, and side effects of benzodiazepines to include potential risk of tolerance and dependence, as well as possible drowsiness.  Advised patient not to drive if experiencing drowsiness and to take lowest possible effective dose to minimize risk of dependence and tolerance.   Discussed potential metabolic  side effects associated with atypical antipsychotics, as well as potential risk for movement side effects. Advised pt to contact office if movement side effects occur.    Dorothyann Gibbs, NP

## 2022-09-27 NOTE — Telephone Encounter (Signed)
At 1:28 pt daughter left a vm asking for a call to discuss her request of pt being seen earlier due to pt's Parkinson's taking a downward.  Pt's daughter reports pt has been in hospitial for this and even a fall that she was in rehab for.  Daughter would like a call about pt being seen earlier , phone rep advised this would be sent to POD for review

## 2022-09-28 ENCOUNTER — Encounter: Payer: Self-pay | Admitting: Adult Health

## 2022-09-29 ENCOUNTER — Emergency Department (HOSPITAL_COMMUNITY): Payer: Medicare PPO

## 2022-09-29 ENCOUNTER — Ambulatory Visit: Payer: Medicare HMO | Admitting: Adult Health

## 2022-09-29 ENCOUNTER — Inpatient Hospital Stay (HOSPITAL_COMMUNITY)
Admission: EM | Admit: 2022-09-29 | Discharge: 2022-10-06 | DRG: 517 | Disposition: A | Discharge status: Skilled Nursing Facility | Payer: Medicare PPO | Attending: Internal Medicine | Admitting: Internal Medicine

## 2022-09-29 ENCOUNTER — Other Ambulatory Visit: Payer: Self-pay

## 2022-09-29 DIAGNOSIS — I159 Secondary hypertension, unspecified: Secondary | ICD-10-CM | POA: Diagnosis not present

## 2022-09-29 DIAGNOSIS — I10 Essential (primary) hypertension: Secondary | ICD-10-CM | POA: Diagnosis present

## 2022-09-29 DIAGNOSIS — S32512A Fracture of superior rim of left pubis, initial encounter for closed fracture: Secondary | ICD-10-CM | POA: Diagnosis not present

## 2022-09-29 DIAGNOSIS — F32A Depression, unspecified: Secondary | ICD-10-CM | POA: Diagnosis present

## 2022-09-29 DIAGNOSIS — R54 Age-related physical debility: Secondary | ICD-10-CM | POA: Diagnosis present

## 2022-09-29 DIAGNOSIS — Z7409 Other reduced mobility: Secondary | ICD-10-CM | POA: Diagnosis not present

## 2022-09-29 DIAGNOSIS — M5136 Other intervertebral disc degeneration, lumbar region: Secondary | ICD-10-CM | POA: Diagnosis not present

## 2022-09-29 DIAGNOSIS — S22000A Wedge compression fracture of unspecified thoracic vertebra, initial encounter for closed fracture: Secondary | ICD-10-CM

## 2022-09-29 DIAGNOSIS — W19XXXD Unspecified fall, subsequent encounter: Secondary | ICD-10-CM | POA: Diagnosis present

## 2022-09-29 DIAGNOSIS — Z96642 Presence of left artificial hip joint: Secondary | ICD-10-CM | POA: Diagnosis present

## 2022-09-29 DIAGNOSIS — Z79899 Other long term (current) drug therapy: Secondary | ICD-10-CM | POA: Diagnosis not present

## 2022-09-29 DIAGNOSIS — M8008XA Age-related osteoporosis with current pathological fracture, vertebra(e), initial encounter for fracture: Principal | ICD-10-CM | POA: Diagnosis present

## 2022-09-29 DIAGNOSIS — M4856XA Collapsed vertebra, not elsewhere classified, lumbar region, initial encounter for fracture: Secondary | ICD-10-CM | POA: Diagnosis not present

## 2022-09-29 DIAGNOSIS — S32039A Unspecified fracture of third lumbar vertebra, initial encounter for closed fracture: Secondary | ICD-10-CM | POA: Diagnosis not present

## 2022-09-29 DIAGNOSIS — R251 Tremor, unspecified: Secondary | ICD-10-CM | POA: Diagnosis not present

## 2022-09-29 DIAGNOSIS — R296 Repeated falls: Secondary | ICD-10-CM | POA: Diagnosis present

## 2022-09-29 DIAGNOSIS — Z8249 Family history of ischemic heart disease and other diseases of the circulatory system: Secondary | ICD-10-CM

## 2022-09-29 DIAGNOSIS — R4701 Aphasia: Secondary | ICD-10-CM | POA: Diagnosis not present

## 2022-09-29 DIAGNOSIS — W19XXXA Unspecified fall, initial encounter: Secondary | ICD-10-CM | POA: Diagnosis present

## 2022-09-29 DIAGNOSIS — Z820 Family history of epilepsy and other diseases of the nervous system: Secondary | ICD-10-CM

## 2022-09-29 DIAGNOSIS — Z836 Family history of other diseases of the respiratory system: Secondary | ICD-10-CM | POA: Diagnosis not present

## 2022-09-29 DIAGNOSIS — S72115D Nondisplaced fracture of greater trochanter of left femur, subsequent encounter for closed fracture with routine healing: Secondary | ICD-10-CM

## 2022-09-29 DIAGNOSIS — S22089A Unspecified fracture of T11-T12 vertebra, initial encounter for closed fracture: Secondary | ICD-10-CM | POA: Diagnosis not present

## 2022-09-29 DIAGNOSIS — R278 Other lack of coordination: Secondary | ICD-10-CM | POA: Diagnosis not present

## 2022-09-29 DIAGNOSIS — M549 Dorsalgia, unspecified: Secondary | ICD-10-CM | POA: Diagnosis present

## 2022-09-29 DIAGNOSIS — E7800 Pure hypercholesterolemia, unspecified: Secondary | ICD-10-CM | POA: Diagnosis present

## 2022-09-29 DIAGNOSIS — R2689 Other abnormalities of gait and mobility: Secondary | ICD-10-CM | POA: Diagnosis not present

## 2022-09-29 DIAGNOSIS — M48061 Spinal stenosis, lumbar region without neurogenic claudication: Secondary | ICD-10-CM | POA: Diagnosis not present

## 2022-09-29 DIAGNOSIS — Z79620 Long term (current) use of immunosuppressive biologic: Secondary | ICD-10-CM | POA: Diagnosis not present

## 2022-09-29 DIAGNOSIS — G20A1 Parkinson's disease without dyskinesia, without mention of fluctuations: Secondary | ICD-10-CM | POA: Diagnosis present

## 2022-09-29 DIAGNOSIS — R531 Weakness: Secondary | ICD-10-CM | POA: Diagnosis not present

## 2022-09-29 DIAGNOSIS — Z7901 Long term (current) use of anticoagulants: Secondary | ICD-10-CM

## 2022-09-29 DIAGNOSIS — S32000D Wedge compression fracture of unspecified lumbar vertebra, subsequent encounter for fracture with routine healing: Secondary | ICD-10-CM | POA: Diagnosis not present

## 2022-09-29 DIAGNOSIS — R9082 White matter disease, unspecified: Secondary | ICD-10-CM | POA: Diagnosis not present

## 2022-09-29 DIAGNOSIS — G959 Disease of spinal cord, unspecified: Secondary | ICD-10-CM | POA: Diagnosis not present

## 2022-09-29 DIAGNOSIS — Y92019 Unspecified place in single-family (private) house as the place of occurrence of the external cause: Secondary | ICD-10-CM | POA: Diagnosis not present

## 2022-09-29 DIAGNOSIS — R1312 Dysphagia, oropharyngeal phase: Secondary | ICD-10-CM | POA: Diagnosis not present

## 2022-09-29 DIAGNOSIS — R519 Headache, unspecified: Secondary | ICD-10-CM | POA: Diagnosis not present

## 2022-09-29 DIAGNOSIS — S22009A Unspecified fracture of unspecified thoracic vertebra, initial encounter for closed fracture: Secondary | ICD-10-CM

## 2022-09-29 DIAGNOSIS — S32000A Wedge compression fracture of unspecified lumbar vertebra, initial encounter for closed fracture: Secondary | ICD-10-CM

## 2022-09-29 DIAGNOSIS — S32592D Other specified fracture of left pubis, subsequent encounter for fracture with routine healing: Secondary | ICD-10-CM | POA: Diagnosis not present

## 2022-09-29 DIAGNOSIS — F419 Anxiety disorder, unspecified: Secondary | ICD-10-CM | POA: Diagnosis present

## 2022-09-29 DIAGNOSIS — M6281 Muscle weakness (generalized): Secondary | ICD-10-CM | POA: Diagnosis not present

## 2022-09-29 DIAGNOSIS — M5126 Other intervertebral disc displacement, lumbar region: Secondary | ICD-10-CM | POA: Diagnosis not present

## 2022-09-29 DIAGNOSIS — R41841 Cognitive communication deficit: Secondary | ICD-10-CM | POA: Diagnosis not present

## 2022-09-29 DIAGNOSIS — S32512D Fracture of superior rim of left pubis, subsequent encounter for fracture with routine healing: Secondary | ICD-10-CM | POA: Diagnosis not present

## 2022-09-29 DIAGNOSIS — R29890 Loss of height: Secondary | ICD-10-CM | POA: Diagnosis not present

## 2022-09-29 DIAGNOSIS — R69 Illness, unspecified: Secondary | ICD-10-CM | POA: Diagnosis not present

## 2022-09-29 DIAGNOSIS — Z7401 Bed confinement status: Secondary | ICD-10-CM | POA: Diagnosis not present

## 2022-09-29 DIAGNOSIS — S22059A Unspecified fracture of T5-T6 vertebra, initial encounter for closed fracture: Secondary | ICD-10-CM | POA: Diagnosis not present

## 2022-09-29 DIAGNOSIS — R1311 Dysphagia, oral phase: Secondary | ICD-10-CM | POA: Diagnosis not present

## 2022-09-29 DIAGNOSIS — S22080A Wedge compression fracture of T11-T12 vertebra, initial encounter for closed fracture: Secondary | ICD-10-CM | POA: Diagnosis not present

## 2022-09-29 LAB — CBC WITH DIFFERENTIAL/PLATELET
Abs Immature Granulocytes: 0.03 10*3/uL (ref 0.00–0.07)
Basophils Absolute: 0 10*3/uL (ref 0.0–0.1)
Basophils Relative: 1 %
Eosinophils Absolute: 0 10*3/uL (ref 0.0–0.5)
Eosinophils Relative: 1 %
HCT: 40.6 % (ref 36.0–46.0)
Hemoglobin: 13.3 g/dL (ref 12.0–15.0)
Immature Granulocytes: 1 %
Lymphocytes Relative: 10 %
Lymphs Abs: 0.6 10*3/uL — ABNORMAL LOW (ref 0.7–4.0)
MCH: 31.2 pg (ref 26.0–34.0)
MCHC: 32.8 g/dL (ref 30.0–36.0)
MCV: 95.3 fL (ref 80.0–100.0)
Monocytes Absolute: 0.4 10*3/uL (ref 0.1–1.0)
Monocytes Relative: 7 %
Neutro Abs: 5 10*3/uL (ref 1.7–7.7)
Neutrophils Relative %: 80 %
Platelets: 263 10*3/uL (ref 150–400)
RBC: 4.26 MIL/uL (ref 3.87–5.11)
RDW: 13.2 % (ref 11.5–15.5)
WBC: 6.2 10*3/uL (ref 4.0–10.5)
nRBC: 0 % (ref 0.0–0.2)

## 2022-09-29 LAB — COMPREHENSIVE METABOLIC PANEL
ALT: 14 U/L (ref 0–44)
AST: 20 U/L (ref 15–41)
Albumin: 3.3 g/dL — ABNORMAL LOW (ref 3.5–5.0)
Alkaline Phosphatase: 67 U/L (ref 38–126)
Anion gap: 10 (ref 5–15)
BUN: 19 mg/dL (ref 8–23)
CO2: 27 mmol/L (ref 22–32)
Calcium: 9 mg/dL (ref 8.9–10.3)
Chloride: 102 mmol/L (ref 98–111)
Creatinine, Ser: 0.85 mg/dL (ref 0.44–1.00)
GFR, Estimated: >60 mL/min (ref >60)
Glucose, Bld: 106 mg/dL — ABNORMAL HIGH (ref 70–99)
Potassium: 4.5 mmol/L (ref 3.5–5.1)
Sodium: 139 mmol/L (ref 135–145)
Total Bilirubin: 0.5 mg/dL (ref 0.3–1.2)
Total Protein: 6.3 g/dL — ABNORMAL LOW (ref 6.5–8.1)

## 2022-09-29 LAB — MAGNESIUM: Magnesium: 2 mg/dL (ref 1.7–2.4)

## 2022-09-29 LAB — VITAMIN D 25 HYDROXY (VIT D DEFICIENCY, FRACTURES): Vit D, 25-Hydroxy: 62.28 ng/mL (ref 30–100)

## 2022-09-29 MED ORDER — CLONAZEPAM 0.25 MG PO TBDP
0.2500 mg | ORAL_TABLET | Freq: Every morning | ORAL | Status: DC
  Administered 2022-09-30 – 2022-10-06 (×7): 0.25 mg via ORAL
  Filled 2022-09-29 (×7): qty 1

## 2022-09-29 MED ORDER — OXYCODONE HCL 5 MG PO TABS
5.0000 mg | ORAL_TABLET | ORAL | Status: DC | PRN
  Administered 2022-10-02 – 2022-10-03 (×2): 5 mg via ORAL
  Filled 2022-09-29 (×2): qty 1

## 2022-09-29 MED ORDER — HYDRALAZINE HCL 10 MG PO TABS: 10.0000 mg | ORAL_TABLET | Freq: Three times a day (TID) | ORAL | Status: DC | PRN

## 2022-09-29 MED ORDER — CLONAZEPAM 0.5 MG PO TABS
0.5000 mg | ORAL_TABLET | Freq: Every day | ORAL | Status: DC
  Administered 2022-09-29 – 2022-10-05 (×7): 0.5 mg via ORAL
  Filled 2022-09-29 (×7): qty 1

## 2022-09-29 MED ORDER — CARBIDOPA-LEVODOPA 25-100 MG PO TABS
1.5000 | ORAL_TABLET | Freq: Three times a day (TID) | ORAL | Status: DC
  Administered 2022-09-29 – 2022-10-06 (×20): 1.5 via ORAL
  Filled 2022-09-29 (×20): qty 2

## 2022-09-29 MED ORDER — ACETAMINOPHEN 650 MG RE SUPP: 650.0000 mg | Freq: Four times a day (QID) | RECTAL | Status: DC | PRN

## 2022-09-29 MED ORDER — METHOCARBAMOL 500 MG PO TABS
500.0000 mg | ORAL_TABLET | Freq: Three times a day (TID) | ORAL | Status: DC | PRN
  Administered 2022-10-03: 500 mg via ORAL
  Filled 2022-09-29: qty 1

## 2022-09-29 MED ORDER — ONDANSETRON HCL 4 MG/2ML IJ SOLN: 4.0000 mg | Freq: Four times a day (QID) | INTRAMUSCULAR | Status: DC | PRN

## 2022-09-29 MED ORDER — FLUOXETINE HCL 20 MG PO CAPS
40.0000 mg | ORAL_CAPSULE | Freq: Two times a day (BID) | ORAL | Status: DC
  Administered 2022-09-29 – 2022-10-06 (×14): 40 mg via ORAL
  Filled 2022-09-29 (×14): qty 2

## 2022-09-29 MED ORDER — AMLODIPINE BESYLATE 5 MG PO TABS
5.0000 mg | ORAL_TABLET | Freq: Every day | ORAL | Status: DC
  Administered 2022-09-30 – 2022-10-06 (×7): 5 mg via ORAL
  Filled 2022-09-29 (×7): qty 1

## 2022-09-29 MED ORDER — ONDANSETRON HCL 4 MG PO TABS: 4.0000 mg | ORAL_TABLET | Freq: Four times a day (QID) | ORAL | Status: DC | PRN

## 2022-09-29 MED ORDER — ACETAMINOPHEN 325 MG PO TABS
650.0000 mg | ORAL_TABLET | Freq: Four times a day (QID) | ORAL | Status: DC | PRN
  Administered 2022-09-30 – 2022-10-05 (×2): 650 mg via ORAL
  Filled 2022-09-29 (×2): qty 2

## 2022-09-29 MED ORDER — CLONAZEPAM 0.5 MG PO TABS: 0.5000 mg | ORAL_TABLET | Freq: Two times a day (BID) | ORAL | Status: DC

## 2022-09-29 MED ORDER — POLYETHYLENE GLYCOL 3350 17 G PO PACK
17.0000 g | PACK | Freq: Every day | ORAL | Status: DC | PRN
  Administered 2022-10-06: 17 g via ORAL
  Filled 2022-09-29: qty 1

## 2022-09-29 NOTE — ED Triage Notes (Signed)
Coming from Home came from EMS   Reduced mobility  Hx of parkinson's dx   Started having hallucinations  Had a neuro appt today was unable to get from the bed to the car. Step daughter wants her evaluated.   D/C from rehab recently. Broke her lumbar 26 days ago.   142/70 62 96% 108 CBG

## 2022-09-29 NOTE — Plan of Care (Signed)
  Problem: Education: Goal: Knowledge of General Education information will improve Description Including pain rating scale, medication(s)/side effects and non-pharmacologic comfort measures Outcome: Progressing   

## 2022-09-29 NOTE — Consult Note (Addendum)
Reason for Consult:burst fractures Referring Physician: EDP  Patricia Fitzpatrick is an 77 y.o. female.   HPI:  77 year old female presented to the ED today after progressive difficulty walking. She was admitted in July after a fall and then was discharged to an inpatient rehab facility. Daughter states that she was getting stronger and using a walker to ambulate. She was discharged from there on Monday and has had difficulty walking since with progressive back pain. Endorses numbness and tingling in feet. She has a history of parkinsons  Past Medical History:  Diagnosis Date   Anxiety    Cataracts, bilateral    Depression    High cholesterol    Hypertension    OCD (obsessive compulsive disorder)    Parkinsonism 10/14/2015   Tremor     Past Surgical History:  Procedure Laterality Date   BREAST BIOPSY  1981   CATARACT EXTRACTION Right 2017   EYE SURGERY  1952   HIP ARTHROPLASTY Left 08/23/2020   Procedure: ARTHROPLASTY BIPOLAR HIP (HEMIARTHROPLASTY);  Surgeon: Bjorn Pippin, MD;  Location: [facility];  Service: Orthopedics;  Laterality: Left;    No Known Allergies  Social History   Tobacco Use   Smoking status: Never   Smokeless tobacco: Never  Substance Use Topics   Alcohol use: Not Currently    Comment: One glass per year. update 04/21/21 pt can't remember her last drink    Family History  Problem Relation Age of Onset   Pulmonary disease Mother    Alzheimer's disease Mother    Heart disease Father    Alzheimer's disease Father    Parkinson's disease Neg Hx      Review of Systems  Positive ROS: as above  All other systems have been reviewed and were otherwise negative with the exception of those mentioned in the HPI and as above.  Objective: Vital signs in last 24 hours: Temp:  [98 F (36.7 C)] 98 F (36.7 C) (08/14 1122) Pulse Rate:  [58-66] 64 (08/14 1620) Resp:  [18] 18 (08/14 1122) BP: (121-143)/(70-88) 140/74 (08/14 1620) SpO2:  [96 %-98 %] 96 % (08/14 1620) Weight:   [46.3 kg] 46.3 kg (08/14 1123)  General Appearance: Alert, cooperative, no distress, appears stated age Head: Normocephalic, without obvious abnormality, atraumatic Eyes: PERRL, conjunctiva/corneas clear, EOM's intact, fundi benign, both eyes      Lungs: , respirations unlabored Heart: Regular rate and rhythm Extremities: Extremities normal, atraumatic, no cyanosis or edema Pulses: 2+ and symmetric all extremities Skin: Skin color, texture, turgor normal, no rashes or lesions  NEUROLOGIC:   Mental status: A&O x4, no aphasia, good attention span, Memory and fund of knowledge Motor Exam - grossly normal, normal tone and bulk Sensory Exam - grossly normal Reflexes: symmetric, no pathologic reflexes, No Hoffman's, No clonus Coordination - grossly normal Gait - not tested Balance - not tested Cranial Nerves: I: smell Not tested  II: visual acuity  OS: na    OD: na  II: visual fields Full to confrontation  II: pupils Equal, round, reactive to light  III,VII: ptosis None  III,IV,VI: extraocular muscles  Full ROM  V: mastication   V: facial light touch sensation    V,VII: corneal reflex    VII: facial muscle function - upper    VII: facial muscle function - lower   VIII: hearing   IX: soft palate elevation    IX,X: gag reflex   XI: trapezius strength    XI: sternocleidomastoid strength   XI:  neck flexion strength    XII: tongue strength      Data Review Lab Results  Component Value Date   WBC 6.2 09/29/2022   HGB 13.3 09/29/2022   HCT 40.6 09/29/2022   MCV 95.3 09/29/2022   PLT 263 09/29/2022   Lab Results  Component Value Date   NA 139 09/29/2022   K 4.5 09/29/2022   CL 102 09/29/2022   CO2 27 09/29/2022   BUN 19 09/29/2022   CREATININE 0.85 09/29/2022   GLUCOSE 106 (H) 09/29/2022   Lab Results  Component Value Date   INR 0.9 08/22/2020    Radiology: CT Lumbar Spine Wo Contrast  Result Date: 09/29/2022 CLINICAL DATA:  Trauma, fall, pain, history of  compression fractures EXAM: CT THORACIC AND LUMBAR SPINE WITHOUT CONTRAST TECHNIQUE: Multidetector CT imaging of the thoracic and lumbar spine was performed without intravenous contrast administration. Multiplanar CT image reconstructions were also generated. RADIATION DOSE REDUCTION: This exam was performed according to the departmental dose-optimization program which includes automated exposure control, adjustment of the mA and/or kV according to patient size and/or use of iterative reconstruction technique. COMPARISON:  08/30/2022 FINDINGS: Alignment: Slightly exaggerated thoracic kyphosis secondary to multiple wedge deformities. Normal lumbar lordosis. Vertebral bodies: Osteopenia. Numerous thoracic and lumbar wedge fractures, including of T5, T7, T11, T12, L1, and L3, particularly notable for near vertebral plana deformities at T7 and T11. There is increased, moderate height loss of comminuted fracture seen at L1, which was probably subacute at the time of imaging dated 08/30/2022, with other fracture deformities unchanged. No new fracture of the thoracic or lumbar spine. Disc spaces: Minimal disc space height loss and endplate osteophytosis throughout the thoracic and lumbar spine. Paraspinous soft tissues: Small bilateral pleural effusions. Coronary artery disease. Aortic atherosclerosis. IMPRESSION: 1. Numerous thoracic and lumbar wedge fractures, including of T5, T7, T11, T12, L1, and L3, particularly notable for near vertebral plana deformities at T7 and T11. 2. There is increased, moderate height loss of comminuted fracture seen at L1, which was probably subacute at the time of imaging dated 08/30/2022, with other fracture deformities unchanged. 3. No new fracture of the thoracic or lumbar spine. 4. Small bilateral pleural effusions. 5. Coronary artery disease. Aortic Atherosclerosis (ICD10-I70.0). Electronically Signed   By: Jearld Lesch M.D.   On: 09/29/2022 15:40   CT Thoracic Spine Wo  Contrast  Result Date: 09/29/2022 CLINICAL DATA:  Trauma, fall, pain, history of compression fractures EXAM: CT THORACIC AND LUMBAR SPINE WITHOUT CONTRAST TECHNIQUE: Multidetector CT imaging of the thoracic and lumbar spine was performed without intravenous contrast administration. Multiplanar CT image reconstructions were also generated. RADIATION DOSE REDUCTION: This exam was performed according to the departmental dose-optimization program which includes automated exposure control, adjustment of the mA and/or kV according to patient size and/or use of iterative reconstruction technique. COMPARISON:  08/30/2022 FINDINGS: Alignment: Slightly exaggerated thoracic kyphosis secondary to multiple wedge deformities. Normal lumbar lordosis. Vertebral bodies: Osteopenia. Numerous thoracic and lumbar wedge fractures, including of T5, T7, T11, T12, L1, and L3, particularly notable for near vertebral plana deformities at T7 and T11. There is increased, moderate height loss of comminuted fracture seen at L1, which was probably subacute at the time of imaging dated 08/30/2022, with other fracture deformities unchanged. No new fracture of the thoracic or lumbar spine. Disc spaces: Minimal disc space height loss and endplate osteophytosis throughout the thoracic and lumbar spine. Paraspinous soft tissues: Small bilateral pleural effusions. Coronary artery disease. Aortic atherosclerosis. IMPRESSION: 1. Numerous thoracic and  lumbar wedge fractures, including of T5, T7, T11, T12, L1, and L3, particularly notable for near vertebral plana deformities at T7 and T11. 2. There is increased, moderate height loss of comminuted fracture seen at L1, which was probably subacute at the time of imaging dated 08/30/2022, with other fracture deformities unchanged. 3. No new fracture of the thoracic or lumbar spine. 4. Small bilateral pleural effusions. 5. Coronary artery disease. Aortic Atherosclerosis (ICD10-I70.0). Electronically Signed    By: Jearld Lesch M.D.   On: 09/29/2022 15:40   CT Cervical Spine Wo Contrast  Result Date: 09/29/2022 CLINICAL DATA:  Fall, pain, back injury, spine fracture EXAM: CT HEAD WITHOUT CONTRAST CT CERVICAL SPINE WITHOUT CONTRAST TECHNIQUE: Multidetector CT imaging of the head and cervical spine was performed following the standard protocol without intravenous contrast. Multiplanar CT image reconstructions of the cervical spine were also generated. RADIATION DOSE REDUCTION: This exam was performed according to the departmental dose-optimization program which includes automated exposure control, adjustment of the mA and/or kV according to patient size and/or use of iterative reconstruction technique. COMPARISON:  08/31/2022 FINDINGS: CT HEAD FINDINGS Brain: No evidence of acute infarction, hemorrhage, hydrocephalus, extra-axial collection or mass lesion/mass effect. Unchanged focal hypodensity left frontal corona radiata (series 2, 20). Periventricular white matter hypodensity. Vascular: No hyperdense vessel or unexpected calcification. Skull: Normal. Negative for fracture or focal lesion. Sinuses/Orbits: No acute finding. Other: None. CT CERVICAL SPINE FINDINGS Alignment: Normal. Skull base and vertebrae: No acute fracture. No primary bone lesion or focal pathologic process. Soft tissues and spinal canal: No prevertebral fluid or swelling. No visible canal hematoma. Disc levels: Moderate disc space height loss and osteophytosis of the lower cervical levels. Upper chest: Negative. Other: None. IMPRESSION: 1. No acute intracranial pathology. Small-vessel white matter disease and unchanged focal hypodensity left frontal corona radiata, keeping with chronic infarction. 2. No fracture or static subluxation of the cervical spine. 3. 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 Electronically Signed   By: Jearld Lesch M.D.   On: 09/29/2022 15:34   CT Head Wo Contrast  Result Date:  09/29/2022 CLINICAL DATA:  Fall, pain, back injury, spine fracture EXAM: CT HEAD WITHOUT CONTRAST CT CERVICAL SPINE WITHOUT CONTRAST TECHNIQUE: Multidetector CT imaging of the head and cervical spine was performed following the standard protocol without intravenous contrast. Multiplanar CT image reconstructions of the cervical spine were also generated. RADIATION DOSE REDUCTION: This exam was performed according to the departmental dose-optimization program which includes automated exposure control, adjustment of the mA and/or kV according to patient size and/or use of iterative reconstruction technique. COMPARISON:  08/31/2022 FINDINGS: CT HEAD FINDINGS Brain: No evidence of acute infarction, hemorrhage, hydrocephalus, extra-axial collection or mass lesion/mass effect. Unchanged focal hypodensity left frontal corona radiata (series 2, 20). Periventricular white matter hypodensity. Vascular: No hyperdense vessel or unexpected calcification. Skull: Normal. Negative for fracture or focal lesion. Sinuses/Orbits: No acute finding. Other: None. CT CERVICAL SPINE FINDINGS Alignment: Normal. Skull base and vertebrae: No acute fracture. No primary bone lesion or focal pathologic process. Soft tissues and spinal canal: No prevertebral fluid or swelling. No visible canal hematoma. Disc levels: Moderate disc space height loss and osteophytosis of the lower cervical levels. Upper chest: Negative. Other: None. IMPRESSION: 1. No acute intracranial pathology. Small-vessel white matter disease and unchanged focal hypodensity left frontal corona radiata, keeping with chronic infarction. 2. No fracture or static subluxation of the cervical spine. 3. 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 Electronically Signed   By:  Jearld Lesch M.D.   On: 09/29/2022 15:34     Assessment/Plan: 77 year old female presented to the ED after a fall. CT thoracic shows several compression and burst fractures at T7, T11, T12,  L1, and L3 with vertebral plana at T7 and T11 with some retropulsion at T12. At this point give her anatomy and osteoporosis I do not think she is a surgical candidate at the hardware would likely fail. Recommend bracing and therapy with pain management. Could consider kyphoplasty at T12 and L1 per interventional radiology. When comparing T11 to her CT scan in July I dont think there is much change. L1 might have progressed a little bit since July.   Patricia Fitzpatrick 09/29/2022 4:45 PM  Agree with above.  Too high a risk for surgery where with that degree of osteopenia and osteoporosis failure radius high and would not be able to capture hardware.  She has previous strength in her legs as above consider the kyphoplasty's of the lower vertebra and physical Occupational Therapy and a lumbar corset

## 2022-09-29 NOTE — ED Provider Notes (Addendum)
[place] EMERGENCY DEPARTMENT AT [facility] Provider Note   CSN: [id number] Arrival date & time: 09/29/22  1107     History  Chief Complaint  Patient presents with   Tremors    Increasing tremors at home. Hx of  parkinsons. Reports on [place]. Reports on way to neurology due to increased tremors and hallucinations. Recent fall 26 days ago with a L fx. Was sent to rehab.    Fall    Patricia Fitzpatrick is a 77 y.o. female.  HPI Patient presents 2 days after going home following a rehabilitation stay that occurred after the patient fell on 1 month ago and sustained compression fracture. Today she was guided to follow-up with neurology as she has history of Parkinson's, has had nonambulatory status since arriving home 2 days ago.  However, with inability to walk, bear weight she was brought here for evaluation.  Initially patient denies fall, but after the patient's stepdaughter arrives, is clearly the patient also had a fall today, landing on her back, unclear if she has new or chronic pain in this area.    Home Medications Prior to Admission medications   Medication Sig Start Date End Date Taking? Authorizing Provider  acetaminophen (TYLENOL) 500 MG tablet Take 2 tablets (1,000 mg total) by mouth 3 (three) times daily. 09/03/22   Berton Mount I, MD  amLODipine (NORVASC) 5 MG tablet Take 1 tablet (5 mg total) by mouth daily. 09/03/22   Barnetta Chapel, MD  B Complex Vitamins (B COMPLEX PO) Take 1 tablet by mouth daily.    [provider]  carbidopa-levodopa (SINEMET IR) 25-100 MG tablet Take 1.5 tablets by mouth 3 (three) times daily. 04/26/22   Penumalli, Glenford Bayley, MD  Cholecalciferol (VITAMIN D3 PO) Take 50 mcg by mouth daily.    [provider]  denosumab (PROLIA) 60 MG/ML SOSY injection Inject 60 mg into the skin every 6 (six) months.    [provider]  docusate sodium (COLACE) 100 MG capsule Take 1 capsule (100 mg total) by mouth 2 (two)  times daily. 09/03/22   Barnetta Chapel, MD  enoxaparin (LOVENOX) 30 MG/0.3ML injection Inject 0.3 mLs (30 mg total) into the skin daily. 09/04/22   Barnetta Chapel, MD  FLUoxetine (PROZAC) 40 MG capsule Take 40 mg by mouth 2 (two) times daily.    [provider]  hydrALAZINE (APRESOLINE) 25 MG tablet Take 1 tablet (25 mg total) by mouth every 6 (six) hours as needed (For SBP greater than 170 mmHg). 09/03/22   Barnetta Chapel, MD  methocarbamol (ROBAXIN) 500 MG tablet Take 1 tablet (500 mg total) by mouth every 8 (eight) hours as needed for muscle spasms. 09/03/22   Berton Mount I, MD  oxyCODONE (OXY IR/ROXICODONE) 5 MG immediate release tablet Take 1 tablet (5 mg total) by mouth every 4 (four) hours as needed for severe pain. 09/03/22   Berton Mount I, MD  polyethylene glycol (MIRALAX / GLYCOLAX) 17 g packet Take 17 g by mouth daily. 09/03/22   Barnetta Chapel, MD  QUEtiapine (SEROQUEL) 25 MG tablet Take 1.5 tablets (37.5 mg total) by mouth at bedtime. 09/03/22   Barnetta Chapel, MD  thiamine (VITAMIN B1) 100 MG tablet Take 1 tablet (100 mg total) by mouth daily. 09/03/22   Barnetta Chapel, MD  vitamin C (ASCORBIC ACID) 500 MG tablet Take 500 mg by mouth daily.    [provider]      Allergies  Patient has no known allergies.    Review of Systems   Review of Systems  Physical Exam Updated Vital Signs BP 127/73   Pulse 62   Temp 98 F (36.7 C)   Resp 18   Ht 5\' 2"  (1.575 m)   Wt 46.3 kg   SpO2 96%   BMI 18.66 kg/m  Physical Exam Vitals and nursing note reviewed.  Constitutional:      General: She is not in acute distress.    Appearance: She is well-developed.     Comments: Frail-appearing elderly female  HENT:     Head: Normocephalic and atraumatic.  Eyes:     Conjunctiva/sclera: Conjunctivae normal.  Cardiovascular:     Rate and Rhythm: Normal rate and regular rhythm.     Pulses: Normal pulses.  Pulmonary:     Effort:  Pulmonary effort is normal. No respiratory distress.     Breath sounds: No stridor.  Abdominal:     General: There is no distension.  Skin:    General: Skin is warm and dry.  Neurological:     Mental Status: She is alert and oriented to person, place, and time.     Cranial Nerves: No cranial nerve deficit.     Comments: Substantial atrophy, and though the patient can follow commands, move extremities spontaneously and to command she has substantial referred pain in her back with flexion of either lower extremity.  Psychiatric:        Mood and Affect: Mood normal.     ED Results / Procedures / Treatments   Labs (all labs ordered are listed, but only abnormal results are displayed) Labs Reviewed  COMPREHENSIVE METABOLIC PANEL - Abnormal; Notable for the following components:      Result Value   Glucose, Bld 106 (*)    Total Protein 6.3 (*)    Albumin 3.3 (*)    All other components within normal limits  CBC WITH DIFFERENTIAL/PLATELET - Abnormal; Notable for the following components:   Lymphs Abs 0.6 (*)    All other components within normal limits  MAGNESIUM    EKG None  Radiology CT Lumbar Spine Wo Contrast  Result Date: 09/29/2022 CLINICAL DATA:  Trauma, fall, pain, history of compression fractures EXAM: CT THORACIC AND LUMBAR SPINE WITHOUT CONTRAST TECHNIQUE: Multidetector CT imaging of the thoracic and lumbar spine was performed without intravenous contrast administration. Multiplanar CT image reconstructions were also generated. RADIATION DOSE REDUCTION: This exam was performed according to the departmental dose-optimization program which includes automated exposure control, adjustment of the mA and/or kV according to patient size and/or use of iterative reconstruction technique. COMPARISON:  08/30/2022 FINDINGS: Alignment: Slightly exaggerated thoracic kyphosis secondary to multiple wedge deformities. Normal lumbar lordosis. Vertebral bodies: Osteopenia. Numerous thoracic and  lumbar wedge fractures, including of T5, T7, T11, T12, L1, and L3, particularly notable for near vertebral plana deformities at T7 and T11. There is increased, moderate height loss of comminuted fracture seen at L1, which was probably subacute at the time of imaging dated 08/30/2022, with other fracture deformities unchanged. No new fracture of the thoracic or lumbar spine. Disc spaces: Minimal disc space height loss and endplate osteophytosis throughout the thoracic and lumbar spine. Paraspinous soft tissues: Small bilateral pleural effusions. Coronary artery disease. Aortic atherosclerosis. IMPRESSION: 1. Numerous thoracic and lumbar wedge fractures, including of T5, T7, T11, T12, L1, and L3, particularly notable for near vertebral plana deformities at T7 and T11. 2. There is increased, moderate height loss of comminuted fracture seen  at L1, which was probably subacute at the time of imaging dated 08/30/2022, with other fracture deformities unchanged. 3. No new fracture of the thoracic or lumbar spine. 4. Small bilateral pleural effusions. 5. Coronary artery disease. Aortic Atherosclerosis (ICD10-I70.0). Electronically Signed   By: Jearld Lesch M.D.   On: 09/29/2022 15:40   CT Thoracic Spine Wo Contrast  Result Date: 09/29/2022 CLINICAL DATA:  Trauma, fall, pain, history of compression fractures EXAM: CT THORACIC AND LUMBAR SPINE WITHOUT CONTRAST TECHNIQUE: Multidetector CT imaging of the thoracic and lumbar spine was performed without intravenous contrast administration. Multiplanar CT image reconstructions were also generated. RADIATION DOSE REDUCTION: This exam was performed according to the departmental dose-optimization program which includes automated exposure control, adjustment of the mA and/or kV according to patient size and/or use of iterative reconstruction technique. COMPARISON:  08/30/2022 FINDINGS: Alignment: Slightly exaggerated thoracic kyphosis secondary to multiple wedge deformities. Normal  lumbar lordosis. Vertebral bodies: Osteopenia. Numerous thoracic and lumbar wedge fractures, including of T5, T7, T11, T12, L1, and L3, particularly notable for near vertebral plana deformities at T7 and T11. There is increased, moderate height loss of comminuted fracture seen at L1, which was probably subacute at the time of imaging dated 08/30/2022, with other fracture deformities unchanged. No new fracture of the thoracic or lumbar spine. Disc spaces: Minimal disc space height loss and endplate osteophytosis throughout the thoracic and lumbar spine. Paraspinous soft tissues: Small bilateral pleural effusions. Coronary artery disease. Aortic atherosclerosis. IMPRESSION: 1. Numerous thoracic and lumbar wedge fractures, including of T5, T7, T11, T12, L1, and L3, particularly notable for near vertebral plana deformities at T7 and T11. 2. There is increased, moderate height loss of comminuted fracture seen at L1, which was probably subacute at the time of imaging dated 08/30/2022, with other fracture deformities unchanged. 3. No new fracture of the thoracic or lumbar spine. 4. Small bilateral pleural effusions. 5. Coronary artery disease. Aortic Atherosclerosis (ICD10-I70.0). Electronically Signed   By: Jearld Lesch M.D.   On: 09/29/2022 15:40   CT Cervical Spine Wo Contrast  Result Date: 09/29/2022 CLINICAL DATA:  Fall, pain, back injury, spine fracture EXAM: CT HEAD WITHOUT CONTRAST CT CERVICAL SPINE WITHOUT CONTRAST TECHNIQUE: Multidetector CT imaging of the head and cervical spine was performed following the standard protocol without intravenous contrast. Multiplanar CT image reconstructions of the cervical spine were also generated. RADIATION DOSE REDUCTION: This exam was performed according to the departmental dose-optimization program which includes automated exposure control, adjustment of the mA and/or kV according to patient size and/or use of iterative reconstruction technique. COMPARISON:  08/31/2022  FINDINGS: CT HEAD FINDINGS Brain: No evidence of acute infarction, hemorrhage, hydrocephalus, extra-axial collection or mass lesion/mass effect. Unchanged focal hypodensity left frontal corona radiata (series 2, 20). Periventricular white matter hypodensity. Vascular: No hyperdense vessel or unexpected calcification. Skull: Normal. Negative for fracture or focal lesion. Sinuses/Orbits: No acute finding. Other: None. CT CERVICAL SPINE FINDINGS Alignment: Normal. Skull base and vertebrae: No acute fracture. No primary bone lesion or focal pathologic process. Soft tissues and spinal canal: No prevertebral fluid or swelling. No visible canal hematoma. Disc levels: Moderate disc space height loss and osteophytosis of the lower cervical levels. Upper chest: Negative. Other: None. IMPRESSION: 1. No acute intracranial pathology. Small-vessel white matter disease and unchanged focal hypodensity left frontal corona radiata, keeping with chronic infarction. 2. No fracture or static subluxation of the cervical spine. 3. 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 Electronically Signed   By: Jearld Lesch  M.D.   On: 09/29/2022 15:34   CT Head Wo Contrast  Result Date: 09/29/2022 CLINICAL DATA:  Fall, pain, back injury, spine fracture EXAM: CT HEAD WITHOUT CONTRAST CT CERVICAL SPINE WITHOUT CONTRAST TECHNIQUE: Multidetector CT imaging of the head and cervical spine was performed following the standard protocol without intravenous contrast. Multiplanar CT image reconstructions of the cervical spine were also generated. RADIATION DOSE REDUCTION: This exam was performed according to the departmental dose-optimization program which includes automated exposure control, adjustment of the mA and/or kV according to patient size and/or use of iterative reconstruction technique. COMPARISON:  08/31/2022 FINDINGS: CT HEAD FINDINGS Brain: No evidence of acute infarction, hemorrhage, hydrocephalus, extra-axial  collection or mass lesion/mass effect. Unchanged focal hypodensity left frontal corona radiata (series 2, 20). Periventricular white matter hypodensity. Vascular: No hyperdense vessel or unexpected calcification. Skull: Normal. Negative for fracture or focal lesion. Sinuses/Orbits: No acute finding. Other: None. CT CERVICAL SPINE FINDINGS Alignment: Normal. Skull base and vertebrae: No acute fracture. No primary bone lesion or focal pathologic process. Soft tissues and spinal canal: No prevertebral fluid or swelling. No visible canal hematoma. Disc levels: Moderate disc space height loss and osteophytosis of the lower cervical levels. Upper chest: Negative. Other: None. IMPRESSION: 1. No acute intracranial pathology. Small-vessel white matter disease and unchanged focal hypodensity left frontal corona radiata, keeping with chronic infarction. 2. No fracture or static subluxation of the cervical spine. 3. 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 Electronically Signed   By: Jearld Lesch M.D.   On: 09/29/2022 15:34    Procedures Procedures    Medications Ordered in ED Medications - No data to display  ED Course/ Medical Decision Making/ A&P                                 Medical Decision Making Adult female with Parkinson's, recent hospitalization following fall with lumbar compression fracture, returned home 3 days ago now presents with weakness, fatigue, ongoing discomfort. Patient is awake and alert, does move her extremities, though with pain and attempted motion of the lower extremities bilaterally.  With additional concern of fall provided by family member after initial evaluation differential including intracranial abnormality, fracture, spinal, cervical all considered. Initial vital signs reassuring, labs all unremarkable.  Amount and/or Complexity of Data Reviewed Independent Historian:     Details: Stepdaughter External Data Reviewed: notes.    Details:  Hospitalization notes Labs: ordered. Decision-making details documented in ED Course. Radiology: ordered and independent interpretation performed. Decision-making details documented in ED Course.  Risk Decision regarding hospitalization.   Patient's evaluation notable for weakness, discomfort, and findings concerning for multiple thoracic and lumbar spine abnormalities, including progression of T12 fracture identified during her last hospitalization.  Patient comfortable neurologically unremarkable aside from difficulty with hip flexion bilaterally. Patient's initial findings including labs reassuring, vital signs, scarring, with concern for weakness following return home, additional falls, multiple abnormalities as above I discussed case with our neurosurgery colleagues, Dr. [name] will see the patient.  On signout consultation is pending.  Chart review Notable for admission for ongoing symptom management in the context of multiple thoracic and lumbar spine fractures, abnormalities.      Final Clinical Impression(s) / ED Diagnoses Final diagnoses:  Fall in home, initial encounter  Compression fracture of body of thoracic vertebra (HCC)  Compression fracture of lumbar vertebra, unspecified lumbar vertebral level, initial encounter (HCC)  Fall, initial encounter  Weakness  Closed fracture of multiple thoracic vertebrae, initial encounter [facility])    Rx / DC Orders ED Discharge Orders     None         Gerhard Munch, MD 10/01/22 1537    Gerhard Munch, MD 10/10/22 [phone]

## 2022-09-29 NOTE — Plan of Care (Signed)
  Problem: Education: Goal: Knowledge of General Education information will improve Description: Including pain rating scale, medication(s)/side effects and non-pharmacologic comfort measures 09/29/2022 2238 by Beryle Flock, RN Outcome: Progressing 09/29/2022 2237 by Beryle Flock, RN Outcome: Progressing

## 2022-09-29 NOTE — ED Notes (Signed)
Pt provided a fan and a pillow.

## 2022-09-29 NOTE — ED Notes (Signed)
ED TO INPATIENT HANDOFF REPORT  ED Nurse Name and Phone #:  [phone]  S Name/Age/Gender Patricia Fitzpatrick 77 y.o. female Room/Bed: 026C/026C  Code Status   Code Status: Prior  Home/SNF/Other Home Patient oriented to: self, place, and time Is this baseline? Yes   Triage Complete: Triage complete  Chief Complaint Lumbar compression fracture, closed, initial encounter [facility]) [S32.000A]  Triage Note Coming from Home came from EMS   Reduced mobility  Hx of parkinson's dx   Started having hallucinations  Had a neuro appt today was unable to get from the bed to the car. Step daughter wants her evaluated.   D/C from rehab recently. Broke her lumbar 26 days ago.   142/70 62 96% 108 CBG   Allergies No Known Allergies  Level of Care/Admitting Diagnosis ED Disposition     ED Disposition  Admit   Condition  --   Comment  Hospital Area: [facility] [[id number]]  Level of Care: Med-Surg [16]  May place patient in observation at [facility] or [facility] if equivalent level of care is available:: No  Covid Evaluation: Asymptomatic - no recent exposure (last 10 days) testing not required  Diagnosis: Lumbar compression fracture, closed, initial encounter [facility]) [[id number]]  Admitting Physician: [place], ERIC J [[id number]]  Attending Physician: [place], ERIC J [[id number]]          B Medical/Surgery History Past Medical History:  Diagnosis Date   Anxiety    Cataracts, bilateral    Depression    High cholesterol    Hypertension    OCD (obsessive compulsive disorder)    Parkinsonism 10/14/2015   Tremor    Past Surgical History:  Procedure Laterality Date   BREAST BIOPSY  1981   CATARACT EXTRACTION Right 2017   EYE SURGERY  1952   HIP ARTHROPLASTY Left 08/23/2020   Procedure: ARTHROPLASTY BIPOLAR HIP (HEMIARTHROPLASTY);  Surgeon: Bjorn Pippin, MD;  Location: [facility]S;  Service: Orthopedics;  Laterality: Left;     A IV Location/Drains/Wounds Patient  Lines/Drains/Airways Status     Active Line/Drains/Airways     None            Intake/Output Last 24 hours No intake or output data in the 24 hours ending 09/29/22 1747  Labs/Imaging Results for orders placed or performed during the hospital encounter of 09/29/22 (from the past 48 hour(s))  Comprehensive metabolic panel     Status: Abnormal   Collection Time: 09/29/22 11:57 AM  Result Value Ref Range   Sodium 139 135 - 145 mmol/L   Potassium 4.5 3.5 - 5.1 mmol/L   Chloride 102 98 - 111 mmol/L   CO2 27 22 - 32 mmol/L   Glucose, Bld 106 (H) 70 - 99 mg/dL    Comment: Glucose reference range applies only to samples taken after fasting for at least 8 hours.   BUN 19 8 - 23 mg/dL   Creatinine, Ser 9.32 0.44 - 1.00 mg/dL   Calcium 9.0 8.9 - 35.5 mg/dL   Total Protein 6.3 (L) 6.5 - 8.1 g/dL   Albumin 3.3 (L) 3.5 - 5.0 g/dL   AST 20 15 - 41 U/L   ALT 14 0 - 44 U/L   Alkaline Phosphatase 67 38 - 126 U/L   Total Bilirubin 0.5 0.3 - 1.2 mg/dL   GFR, Estimated >73 >22 mL/min    Comment: (NOTE) Calculated using the CKD-EPI Creatinine Equation (2021)    Anion gap 10 5 - 15    Comment:  Performed at [facility], [address], [place], [place] [address]  CBC with Differential     Status: Abnormal   Collection Time: 09/29/22 11:57 AM  Result Value Ref Range   WBC 6.2 4.0 - 10.5 K/uL   RBC 4.26 3.87 - 5.11 MIL/uL   Hemoglobin 13.3 12.0 - 15.0 g/dL   HCT 91.4 78.2 - 95.6 %   MCV 95.3 80.0 - 100.0 fL   MCH 31.2 26.0 - 34.0 pg   MCHC 32.8 30.0 - 36.0 g/dL   RDW 21.3 08.6 - 57.8 %   Platelets 263 150 - 400 K/uL   nRBC 0.0 0.0 - 0.2 %   Neutrophils Relative % 80 %   Neutro Abs 5.0 1.7 - 7.7 K/uL   Lymphocytes Relative 10 %   Lymphs Abs 0.6 (L) 0.7 - 4.0 K/uL   Monocytes Relative 7 %   Monocytes Absolute 0.4 0.1 - 1.0 K/uL   Eosinophils Relative 1 %   Eosinophils Absolute 0.0 0.0 - 0.5 K/uL   Basophils Relative 1 %   Basophils Absolute 0.0 0.0 - 0.1 K/uL   Immature  Granulocytes 1 %   Abs Immature Granulocytes 0.03 0.00 - 0.07 K/uL    Comment: Performed at [facility], [address] [place], [place] [address]  Magnesium     Status: None   Collection Time: 09/29/22 11:57 AM  Result Value Ref Range   Magnesium 2.0 1.7 - 2.4 mg/dL    Comment: Performed at [facility], [address] [place], [place] [address]   CT Lumbar Spine Wo Contrast  Result Date: 09/29/2022 CLINICAL DATA:  Trauma, fall, pain, history of compression fractures EXAM: CT THORACIC AND LUMBAR SPINE WITHOUT CONTRAST TECHNIQUE: Multidetector CT imaging of the thoracic and lumbar spine was performed without intravenous contrast administration. Multiplanar CT image reconstructions were also generated. RADIATION DOSE REDUCTION: This exam was performed according to the departmental dose-optimization program which includes automated exposure control, adjustment of the mA and/or kV according to patient size and/or use of iterative reconstruction technique. COMPARISON:  08/30/2022 FINDINGS: Alignment: Slightly exaggerated thoracic kyphosis secondary to multiple wedge deformities. Normal lumbar lordosis. Vertebral bodies: Osteopenia. Numerous thoracic and lumbar wedge fractures, including of T5, T7, T11, T12, L1, and L3, particularly notable for near vertebral plana deformities at T7 and T11. There is increased, moderate height loss of comminuted fracture seen at L1, which was probably subacute at the time of imaging dated 08/30/2022, with other fracture deformities unchanged. No new fracture of the thoracic or lumbar spine. Disc spaces: Minimal disc space height loss and endplate osteophytosis throughout the thoracic and lumbar spine. Paraspinous soft tissues: Small bilateral pleural effusions. Coronary artery disease. Aortic atherosclerosis. IMPRESSION: 1. Numerous thoracic and lumbar wedge fractures, including of T5, T7, T11, T12, L1, and L3, particularly notable for near vertebral plana  deformities at T7 and T11. 2. There is increased, moderate height loss of comminuted fracture seen at L1, which was probably subacute at the time of imaging dated 08/30/2022, with other fracture deformities unchanged. 3. No new fracture of the thoracic or lumbar spine. 4. Small bilateral pleural effusions. 5. Coronary artery disease. Aortic Atherosclerosis (ICD10-I70.0). Electronically Signed   By: Jearld Lesch M.D.   On: 09/29/2022 15:40   CT Thoracic Spine Wo Contrast  Result Date: 09/29/2022 CLINICAL DATA:  Trauma, fall, pain, history of compression fractures EXAM: CT THORACIC AND LUMBAR SPINE WITHOUT CONTRAST TECHNIQUE: Multidetector CT imaging of the thoracic and lumbar  spine was performed without intravenous contrast administration. Multiplanar CT image reconstructions were also generated. RADIATION DOSE REDUCTION: This exam was performed according to the departmental dose-optimization program which includes automated exposure control, adjustment of the mA and/or kV according to patient size and/or use of iterative reconstruction technique. COMPARISON:  08/30/2022 FINDINGS: Alignment: Slightly exaggerated thoracic kyphosis secondary to multiple wedge deformities. Normal lumbar lordosis. Vertebral bodies: Osteopenia. Numerous thoracic and lumbar wedge fractures, including of T5, T7, T11, T12, L1, and L3, particularly notable for near vertebral plana deformities at T7 and T11. There is increased, moderate height loss of comminuted fracture seen at L1, which was probably subacute at the time of imaging dated 08/30/2022, with other fracture deformities unchanged. No new fracture of the thoracic or lumbar spine. Disc spaces: Minimal disc space height loss and endplate osteophytosis throughout the thoracic and lumbar spine. Paraspinous soft tissues: Small bilateral pleural effusions. Coronary artery disease. Aortic atherosclerosis. IMPRESSION: 1. Numerous thoracic and lumbar wedge fractures, including of T5,  T7, T11, T12, L1, and L3, particularly notable for near vertebral plana deformities at T7 and T11. 2. There is increased, moderate height loss of comminuted fracture seen at L1, which was probably subacute at the time of imaging dated 08/30/2022, with other fracture deformities unchanged. 3. No new fracture of the thoracic or lumbar spine. 4. Small bilateral pleural effusions. 5. Coronary artery disease. Aortic Atherosclerosis (ICD10-I70.0). Electronically Signed   By: Jearld Lesch M.D.   On: 09/29/2022 15:40   CT Cervical Spine Wo Contrast  Result Date: 09/29/2022 CLINICAL DATA:  Fall, pain, back injury, spine fracture EXAM: CT HEAD WITHOUT CONTRAST CT CERVICAL SPINE WITHOUT CONTRAST TECHNIQUE: Multidetector CT imaging of the head and cervical spine was performed following the standard protocol without intravenous contrast. Multiplanar CT image reconstructions of the cervical spine were also generated. RADIATION DOSE REDUCTION: This exam was performed according to the departmental dose-optimization program which includes automated exposure control, adjustment of the mA and/or kV according to patient size and/or use of iterative reconstruction technique. COMPARISON:  08/31/2022 FINDINGS: CT HEAD FINDINGS Brain: No evidence of acute infarction, hemorrhage, hydrocephalus, extra-axial collection or mass lesion/mass effect. Unchanged focal hypodensity left frontal corona radiata (series 2, 20). Periventricular white matter hypodensity. Vascular: No hyperdense vessel or unexpected calcification. Skull: Normal. Negative for fracture or focal lesion. Sinuses/Orbits: No acute finding. Other: None. CT CERVICAL SPINE FINDINGS Alignment: Normal. Skull base and vertebrae: No acute fracture. No primary bone lesion or focal pathologic process. Soft tissues and spinal canal: No prevertebral fluid or swelling. No visible canal hematoma. Disc levels: Moderate disc space height loss and osteophytosis of the lower cervical  levels. Upper chest: Negative. Other: None. IMPRESSION: 1. No acute intracranial pathology. Small-vessel white matter disease and unchanged focal hypodensity left frontal corona radiata, keeping with chronic infarction. 2. No fracture or static subluxation of the cervical spine. 3. 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 Electronically Signed   By: Jearld Lesch M.D.   On: 09/29/2022 15:34   CT Head Wo Contrast  Result Date: 09/29/2022 CLINICAL DATA:  Fall, pain, back injury, spine fracture EXAM: CT HEAD WITHOUT CONTRAST CT CERVICAL SPINE WITHOUT CONTRAST TECHNIQUE: Multidetector CT imaging of the head and cervical spine was performed following the standard protocol without intravenous contrast. Multiplanar CT image reconstructions of the cervical spine were also generated. RADIATION DOSE REDUCTION: This exam was performed according to the departmental dose-optimization program which includes automated exposure control, adjustment of the mA and/or kV according to patient  size and/or use of iterative reconstruction technique. COMPARISON:  08/31/2022 FINDINGS: CT HEAD FINDINGS Brain: No evidence of acute infarction, hemorrhage, hydrocephalus, extra-axial collection or mass lesion/mass effect. Unchanged focal hypodensity left frontal corona radiata (series 2, 20). Periventricular white matter hypodensity. Vascular: No hyperdense vessel or unexpected calcification. Skull: Normal. Negative for fracture or focal lesion. Sinuses/Orbits: No acute finding. Other: None. CT CERVICAL SPINE FINDINGS Alignment: Normal. Skull base and vertebrae: No acute fracture. No primary bone lesion or focal pathologic process. Soft tissues and spinal canal: No prevertebral fluid or swelling. No visible canal hematoma. Disc levels: Moderate disc space height loss and osteophytosis of the lower cervical levels. Upper chest: Negative. Other: None. IMPRESSION: 1. No acute intracranial pathology. Small-vessel white  matter disease and unchanged focal hypodensity left frontal corona radiata, keeping with chronic infarction. 2. No fracture or static subluxation of the cervical spine. 3. 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 Electronically Signed   By: Jearld Lesch M.D.   On: 09/29/2022 15:34    Pending Labs Unresulted Labs (From admission, onward)    None       Vitals/Pain Today's Vitals   09/29/22 1645 09/29/22 1700 09/29/22 1715 09/29/22 1730  BP: 135/71 120/71 (!) 143/75 (!) 145/73  Pulse: 65 63 66 67  Resp:      Temp:      SpO2: 95% 95% 95% 94%  Weight:      Height:      PainSc:        Isolation Precautions No active isolations  Medications Medications - No data to display  Mobility non-ambulatory     Focused Assessments Neuro Assessment Handoff:  Swallow screen pass?            Neuro Assessment:   Neuro Checks:      Has TPA been given? No If patient is a Neuro Trauma and patient is going to OR before floor call report to 4N Charge nurse: [phone] or [phone]   R Recommendations: See Admitting Provider Note  Report given to:   Additional Notes:

## 2022-09-29 NOTE — H&P (Signed)
History and Physical    Patient: Patricia Fitzpatrick:[id number] DOB: October 24, 1945 DOA: 09/29/2022 DOS: the patient was seen and examined on 09/29/2022 PCP: [name], MD  Patient coming from: Home  Chief Complaint: Back pain, weakness, fall  Chief Complaint  Patient presents with   Tremors    Increasing tremors at home. Hx of  parkinsons. Reports on Carbadopa. Reports on way to neurology due to increased tremors and hallucinations. Recent fall 26 days ago with a L fx. Was sent to rehab.    Fall   HPI: Patricia Fitzpatrick is a 77 y.o. female with medical history significant of Parkinson's disease, depression, essential hypertension and recent hospitalization with L1 lumbar vertebral compression fracture, pubic ramus fracture and closed displaced fracture greater trochanter left femur who presented to [name], ED from home on 8/14 with progressive weakness, recurrent falls and continued/increased low back pain.  Patient was recently discharged from SNF but felt that still needed aggressive rehab.  Since returning home has progressively become weaker with decreased mobilization.  Patient is not headache, no visual changes, no chest pain, no palpitations, no shortness of breath, no abdominal pain, no fever/chills/night sweats, no nausea/vomit/diarrhea, no cough/congestion, no focal weakness, no fatigue, no paresthesias.    In the ED, temperature 98.0 F, HR 64, RR 18, BP 140/74, SpO2 96% on room air.  WBC 6.2, hemoglobin 13.3, platelet count 243.  Sodium 139, potassium 4.5, CO2 27, BUN 19, creatinine 0.5, glucose 106.  AST 20, ALT 14, total bilirubin 0.5.  CT head/C-spine with no acute findings/fracture or subluxation.  CT T/L-spine with numerous thoracic/lumbar wedge fractures T5, T7, T12, L1, L3 with near plana deformities T7 and T11.  Neurosurgery was consulted and given her anatomy, osteoporosis she is not a surgical candidate as the hardware would likely fail and recommended bracing, therapy evaluation,  pain management and IR consultation.  EDP consulted [facility] for admission for further evaluation and management of recurrent falls associated with increased back pain and weakness related to thoracic/lumbar compression fractures.   Review of Systems: As mentioned in the history of present illness. All other systems reviewed and are negative. Past Medical History:  Diagnosis Date   Anxiety    Cataracts, bilateral    Depression    High cholesterol    Hypertension    OCD (obsessive compulsive disorder)    Parkinsonism 10/14/2015   Tremor    Past Surgical History:  Procedure Laterality Date   BREAST BIOPSY  1981   CATARACT EXTRACTION Right 2017   EYE SURGERY  1952   HIP ARTHROPLASTY Left 08/23/2020   Procedure: ARTHROPLASTY BIPOLAR HIP (HEMIARTHROPLASTY);  Surgeon: Bjorn Pippin, MD;  Location: [facility];  Service: Orthopedics;  Laterality: Left;   Social History:  reports that she has never smoked. She has never used smokeless tobacco. She reports that she does not currently use alcohol. She reports that she does not use drugs.  No Known Allergies  Family History  Problem Relation Age of Onset   Pulmonary disease Mother    Alzheimer's disease Mother    Heart disease Father    Alzheimer's disease Father    Parkinson's disease Neg Hx     Prior to Admission medications   Medication Sig Start Date End Date Taking? Authorizing Provider  acetaminophen (TYLENOL) 500 MG tablet Take 2 tablets (1,000 mg total) by mouth 3 (three) times daily. 09/03/22  Yes Berton Mount I, MD  amLODipine (NORVASC) 5 MG tablet Take 1 tablet (5 mg total)  by mouth daily. 09/03/22  Yes Berton Mount I, MD  carbidopa-levodopa (SINEMET IR) 25-100 MG tablet Take 1.5 tablets by mouth 3 (three) times daily. 04/26/22  Yes Penumalli, Glenford Bayley, MD  clonazePAM (KLONOPIN) 0.5 MG tablet Take 0.5 mg by mouth 2 (two) times daily. 1/2 tab in the morning, 1 tab at bedtime daily 09/27/22  Yes [provider]  docusate  sodium (COLACE) 100 MG capsule Take 1 capsule (100 mg total) by mouth 2 (two) times daily. 09/03/22  Yes Berton Mount I, MD  FLUoxetine (PROZAC) 40 MG capsule Take 40 mg by mouth 2 (two) times daily.   Yes [provider]  methocarbamol (ROBAXIN) 500 MG tablet Take 1 tablet (500 mg total) by mouth every 8 (eight) hours as needed for muscle spasms. 09/03/22  Yes Berton Mount I, MD  oxyCODONE (OXY IR/ROXICODONE) 5 MG immediate release tablet Take 1 tablet (5 mg total) by mouth every 4 (four) hours as needed for severe pain. 09/03/22  Yes Berton Mount I, MD  polyethylene glycol (MIRALAX / GLYCOLAX) 17 g packet Take 17 g by mouth daily. 09/03/22  Yes Barnetta Chapel, MD  B Complex Vitamins (B COMPLEX PO) Take 1 tablet by mouth daily.    [provider]  Cholecalciferol (VITAMIN D3 PO) Take 50 mcg by mouth daily.    [provider]  denosumab (PROLIA) 60 MG/ML SOSY injection Inject 60 mg into the skin every 6 (six) months.    [provider]  enoxaparin (LOVENOX) 30 MG/0.3ML injection Inject 0.3 mLs (30 mg total) into the skin daily. Patient not taking: Reported on 09/29/2022 09/04/22   Berton Mount I, MD  hydrALAZINE (APRESOLINE) 25 MG tablet Take 1 tablet (25 mg total) by mouth every 6 (six) hours as needed (For SBP greater than 170 mmHg). Patient not taking: Reported on 09/29/2022 09/03/22   Berton Mount I, MD  QUEtiapine (SEROQUEL) 25 MG tablet Take 1.5 tablets (37.5 mg total) by mouth at bedtime. Patient not taking: Reported on 09/29/2022 09/03/22   Berton Mount I, MD  thiamine (VITAMIN B1) 100 MG tablet Take 1 tablet (100 mg total) by mouth daily. 09/03/22   Barnetta Chapel, MD  vitamin C (ASCORBIC ACID) 500 MG tablet Take 500 mg by mouth daily.    [provider]    Physical Exam: Vitals:   09/29/22 1715 09/29/22 1730 09/29/22 1745 09/29/22 1757  BP: (!) 143/75 (!) 145/73 (!) 151/77   Pulse: 66 67 68   Resp:      Temp:     98 F (36.7 C)  TempSrc:    Oral  SpO2: 95% 94% 98%   Weight:      Height:        GEN: 77 yo female in NAD, alert and oriented x 3, chronically ill/thin in appearance HEENT: NCAT, PERRL, EOMI, sclera clear, MMM PULM: CTAB w/o wheezes/crackles, normal respiratory effort, on room air CV: RRR w/o M/G/R GI: abd soft, NTND, NABS, no R/G/M MSK: no peripheral edema, muscle strength globally intact 5/5 bilateral upper/lower extremities NEURO: CN II-XII intact, slight resting tremor, otherwise no focal deficits, sensation to light touch intact, muscle strength preserved, PSYCH: normal mood/affect Integumentary: dry/intact, no rashes or wounds    Assessment and Plan:  Thoracic/lumbar spine compression fracture Patient presenting to ED with continued/progressive low back pain in the setting of recurrent falls. CT T/L-spine with numerous thoracic/lumbar wedge fractures T5, T7, T12, L1, L3 with near plana deformities T7 and T11.  Neurosurgery  was consulted and given her anatomy, osteoporosis she is not a surgical candidate as the hardware would likely fail and recommended bracing, therapy evaluation, pain management and IR consultation for consideration of kyphoplasty. -- Admit to observation -- IR evaluation for consideration of kyphoplasty; holding DVT prophylaxis, n.p.o. after midnight -- PT/OT evaluation -- Check vitamin D 25-hydroxy level -- Pain control with Tylenol/oxycodone -- Robaxin 500 mg p.o. every 8 hours.  Muscle spasms -- Fall precautions  Parkinson's disease -- Carbidopa/levodopa 25-100 mg 1.5 tablets 3 times daily  Essential hypertension -- Amlodipine 5 mg p.o. daily -- Hydralazine 10 mg p.o. every 8 hours as needed SBP >165 or DBP >110  Depression/anxiety -- Prozac 40 mg p.o. twice daily -- Clonazepam 0.5 mg p.o. twice daily  Recurrent falls Weakness/debility/deconditioning/gait disturbance: -- PT/OT evaluation -- TOC consult for likely need of return to  SNF     Advance Care Planning:   Code Status: Full Code   Consults: Neurosurgery, Dr. [name]  Family Communication: Updated patient's stepdaughter present at bedside  Severity of Illness: The appropriate patient status for this patient is OBSERVATION. Observation status is judged to be reasonable and necessary in order to provide the required intensity of service to ensure the patient's safety. The patient's presenting symptoms, physical exam findings, and initial radiographic and laboratory data in the context of their medical condition is felt to place them at decreased risk for further clinical deterioration. Furthermore, it is anticipated that the patient will be medically stable for discharge from the hospital within 2 midnights of admission.   Author: Alvira Philips [place], DO 09/29/2022 5:58 PM  For on call review [email or web address].

## 2022-09-29 NOTE — ED Provider Notes (Signed)
Patient evaluated by neurosurgery.  No indication for surgery at this time.  Recommendation is for brace, possible kyphoplasty.  Case discussed with medical service regarding admission   Linwood Dibbles, MD 09/29/22 [phone]

## 2022-09-30 ENCOUNTER — Observation Stay (HOSPITAL_COMMUNITY): Payer: Medicare PPO

## 2022-09-30 ENCOUNTER — Encounter (HOSPITAL_COMMUNITY): Payer: Self-pay | Admitting: Internal Medicine

## 2022-09-30 DIAGNOSIS — S32512A Fracture of superior rim of left pubis, initial encounter for closed fracture: Secondary | ICD-10-CM | POA: Diagnosis not present

## 2022-09-30 DIAGNOSIS — S32000A Wedge compression fracture of unspecified lumbar vertebra, initial encounter for closed fracture: Secondary | ICD-10-CM | POA: Diagnosis not present

## 2022-09-30 DIAGNOSIS — M5126 Other intervertebral disc displacement, lumbar region: Secondary | ICD-10-CM | POA: Diagnosis not present

## 2022-09-30 DIAGNOSIS — R29890 Loss of height: Secondary | ICD-10-CM | POA: Diagnosis not present

## 2022-09-30 DIAGNOSIS — M48061 Spinal stenosis, lumbar region without neurogenic claudication: Secondary | ICD-10-CM | POA: Diagnosis not present

## 2022-09-30 DIAGNOSIS — Z96642 Presence of left artificial hip joint: Secondary | ICD-10-CM | POA: Diagnosis not present

## 2022-09-30 DIAGNOSIS — M5136 Other intervertebral disc degeneration, lumbar region: Secondary | ICD-10-CM | POA: Diagnosis not present

## 2022-09-30 NOTE — Progress Notes (Signed)
PROGRESS NOTE    Patricia Fitzpatrick  ZDG:[id number] DOB: April 17, 1945 DOA: 09/29/2022 PCP: [name], MD    Brief Narrative:   Patricia Fitzpatrick is a 77 y.o. female with past medical history significant for Parkinson's disease, depression, essential hypertension and recent hospitalization with L1 lumbar vertebral compression fracture, pubic ramus fracture and closed displaced fracture greater trochanter left femur who presented to [name], ED from home on 8/14 with progressive weakness, recurrent falls and continued/increased low back pain.  Patient was recently discharged from SNF but felt that still needed aggressive rehab.  Since returning home has progressively become weaker with decreased mobilization.  Patient is not headache, no visual changes, no chest pain, no palpitations, no shortness of breath, no abdominal pain, no fever/chills/night sweats, no nausea/vomit/diarrhea, no cough/congestion, no focal weakness, no fatigue, no paresthesias.     In the ED, temperature 98.0 F, HR 64, RR 18, BP 140/74, SpO2 96% on room air.  WBC 6.2, hemoglobin 13.3, platelet count 243.  Sodium 139, potassium 4.5, CO2 27, BUN 19, creatinine 0.5, glucose 106.  AST 20, ALT 14, total bilirubin 0.5.  CT head/C-spine with no acute findings/fracture or subluxation.  CT T/L-spine with numerous thoracic/lumbar wedge fractures T5, T7, T12, L1, L3 with near plana deformities T7 and T11.  Neurosurgery was consulted and given her anatomy, osteoporosis she is not a surgical candidate as the hardware would likely fail and recommended bracing, therapy evaluation, pain management and IR consultation.  EDP consulted [facility] for admission for further evaluation and management of recurrent falls associated with increased back pain and weakness related to thoracic/lumbar compression fractures.  Assessment & Plan:   Thoracic/lumbar spine compression fracture Patient presenting to ED with continued/progressive low back pain in the setting of  recurrent falls. CT T/L-spine with numerous thoracic/lumbar wedge fractures T5, T7, T12, L1, L3 with near plana deformities T7 and T11.  Neurosurgery was consulted and given her anatomy, osteoporosis she is not a surgical candidate as the hardware would likely fail and recommended bracing, therapy evaluation, pain management and IR consultation for consideration of kyphoplasty.  Vitamin D 25-hydroxy level 62.28, within normal limits. -- Neurosurgery following, appreciate assistance -- IR evaluation for consideration of kyphoplasty; holding DVT prophylaxis, n.p.o. pending evaluation -- PT/OT evaluation: pending -- Pain control with Tylenol/oxycodone -- Robaxin 500 mg p.o. q8h PRN Muscle spasms -- Fall precautions   Parkinson's disease -- Carbidopa/levodopa 25-100 mg 1.5 tablets 3 times daily   Essential hypertension -- Amlodipine 5 mg p.o. daily -- Hydralazine 10 mg p.o. every 8 hours as needed SBP >165 or DBP >110   Depression/anxiety -- Prozac 40 mg p.o. twice daily -- Clonazepam 0.5 mg p.o. twice daily   Recurrent falls Weakness/debility/deconditioning/gait disturbance: -- PT/OT evaluation: pending -- TOC consult for likely need of return to SNF   DVT prophylaxis: SCDs Start: 09/29/22 1824    Code Status: Full Code Family Communication: No family present at bedside this morning, updated stepdaughter extensively at bedside on 8/14  Disposition Plan:  Level of care: Med-Surg Status is: Observation The patient remains OBS appropriate and will d/c before 2 midnights.    Consultants:  Neurosurgery, Dr. [name] Interventional radiology  Procedures:  None  Antimicrobials:  None   Subjective: Patient seen and examined at bedside, lying in bed.  Reports she felt "confused" this morning and she did not know "where she was".  Confusion now seems to have resolved.  Seen by neurosurgery this morning, pending IR evaluation for consideration of kyphoplasty  in the T12-L1 segment.   Currently reports pain controlled.  No other questions or concerns at this time.  Denies headache, no dizziness, no chest pain, no shortness of breath, no abdominal pain, no fever/chills/night sweats, no nausea/vomiting/diarrhea, no focal weakness, no fatigue, no paresthesias.  No other acute events overnight per nurse staff.  Objective: Vitals:   09/29/22 1933 09/29/22 2316 09/30/22 0350 09/30/22 0812  BP: 128/75 105/63 124/77 117/69  Pulse: 67 61 (!) 58 (!) 58  Resp: 18 16 16 13   Temp: 98.3 F (36.8 C) 97.8 F (36.6 C) 97.8 F (36.6 C) 98.1 F (36.7 C)  TempSrc: Axillary Axillary Axillary Oral  SpO2: 98% 96% 98% 96%  Weight:      Height:        Intake/Output Summary (Last 24 hours) at 09/30/2022 0953 Last data filed at 09/30/2022 1610 Gross per 24 hour  Intake 138 ml  Output 350 ml  Net -212 ml   Filed Weights   09/29/22 1123 09/29/22 1819  Weight: 46.3 kg 44.6 kg    Examination:  Physical Exam: GEN: NAD, alert and oriented x 3, chronically ill/thin in appearance, mild tremors upper extremities at rest HEENT: NCAT, PERRL, EOMI, sclera clear, MMM PULM: CTAB w/o wheezes/crackles, normal respiratory effort CV: RRR w/o M/G/R GI: abd soft, NTND, NABS, no R/G/M MSK: no peripheral edema, muscle strength globally intact 5/5 bilateral upper/lower extremities NEURO: CN II-XII intact, slight resting tremor, otherwise no focal deficits, sensation to light touch intact PSYCH: normal mood/flat affect Integumentary: dry/intact, no rashes or wounds    Data Reviewed: I have personally reviewed following labs and imaging studies  CBC: Recent Labs  Lab 09/29/22 1157  WBC 6.2  NEUTROABS 5.0  HGB 13.3  HCT 40.6  MCV 95.3  PLT 263   Basic Metabolic Panel: Recent Labs  Lab 09/29/22 1157  NA 139  K 4.5  CL 102  CO2 27  GLUCOSE 106*  BUN 19  CREATININE 0.85  CALCIUM 9.0  MG 2.0   GFR: Estimated Creatinine Clearance: 39.6 mL/min (by C-G formula based on SCr of 0.85  mg/dL). Liver Function Tests: Recent Labs  Lab 09/29/22 1157  AST 20  ALT 14  ALKPHOS 67  BILITOT 0.5  PROT 6.3*  ALBUMIN 3.3*   No results for input(s): "LIPASE", "AMYLASE" in the last 168 hours. No results for input(s): "AMMONIA" in the last 168 hours. Coagulation Profile: No results for input(s): "INR", "PROTIME" in the last 168 hours. Cardiac Enzymes: No results for input(s): "CKTOTAL", "CKMB", "CKMBINDEX", "TROPONINI" in the last 168 hours. BNP (last 3 results) No results for input(s): "PROBNP" in the last 8760 hours. HbA1C: No results for input(s): "HGBA1C" in the last 72 hours. CBG: No results for input(s): "GLUCAP" in the last 168 hours. Lipid Profile: No results for input(s): "CHOL", "HDL", "LDLCALC", "TRIG", "CHOLHDL", "LDLDIRECT" in the last 72 hours. Thyroid Function Tests: No results for input(s): "TSH", "T4TOTAL", "FREET4", "T3FREE", "THYROIDAB" in the last 72 hours. Anemia Panel: No results for input(s): "VITAMINB12", "FOLATE", "FERRITIN", "TIBC", "IRON", "RETICCTPCT" in the last 72 hours. Sepsis Labs: No results for input(s): "PROCALCITON", "LATICACIDVEN" in the last 168 hours.  No results found for this or any previous visit (from the past 240 hour(s)).       Radiology Studies: CT Lumbar Spine Wo Contrast  Result Date: 09/29/2022 CLINICAL DATA:  Trauma, fall, pain, history of compression fractures EXAM: CT THORACIC AND LUMBAR SPINE WITHOUT CONTRAST TECHNIQUE: Multidetector CT imaging of the thoracic and lumbar spine  was performed without intravenous contrast administration. Multiplanar CT image reconstructions were also generated. RADIATION DOSE REDUCTION: This exam was performed according to the departmental dose-optimization program which includes automated exposure control, adjustment of the mA and/or kV according to patient size and/or use of iterative reconstruction technique. COMPARISON:  08/30/2022 FINDINGS: Alignment: Slightly exaggerated thoracic  kyphosis secondary to multiple wedge deformities. Normal lumbar lordosis. Vertebral bodies: Osteopenia. Numerous thoracic and lumbar wedge fractures, including of T5, T7, T11, T12, L1, and L3, particularly notable for near vertebral plana deformities at T7 and T11. There is increased, moderate height loss of comminuted fracture seen at L1, which was probably subacute at the time of imaging dated 08/30/2022, with other fracture deformities unchanged. No new fracture of the thoracic or lumbar spine. Disc spaces: Minimal disc space height loss and endplate osteophytosis throughout the thoracic and lumbar spine. Paraspinous soft tissues: Small bilateral pleural effusions. Coronary artery disease. Aortic atherosclerosis. IMPRESSION: 1. Numerous thoracic and lumbar wedge fractures, including of T5, T7, T11, T12, L1, and L3, particularly notable for near vertebral plana deformities at T7 and T11. 2. There is increased, moderate height loss of comminuted fracture seen at L1, which was probably subacute at the time of imaging dated 08/30/2022, with other fracture deformities unchanged. 3. No new fracture of the thoracic or lumbar spine. 4. Small bilateral pleural effusions. 5. Coronary artery disease. Aortic Atherosclerosis (ICD10-I70.0). Electronically Signed   By: Jearld Lesch M.D.   On: 09/29/2022 15:40   CT Thoracic Spine Wo Contrast  Result Date: 09/29/2022 CLINICAL DATA:  Trauma, fall, pain, history of compression fractures EXAM: CT THORACIC AND LUMBAR SPINE WITHOUT CONTRAST TECHNIQUE: Multidetector CT imaging of the thoracic and lumbar spine was performed without intravenous contrast administration. Multiplanar CT image reconstructions were also generated. RADIATION DOSE REDUCTION: This exam was performed according to the departmental dose-optimization program which includes automated exposure control, adjustment of the mA and/or kV according to patient size and/or use of iterative reconstruction technique.  COMPARISON:  08/30/2022 FINDINGS: Alignment: Slightly exaggerated thoracic kyphosis secondary to multiple wedge deformities. Normal lumbar lordosis. Vertebral bodies: Osteopenia. Numerous thoracic and lumbar wedge fractures, including of T5, T7, T11, T12, L1, and L3, particularly notable for near vertebral plana deformities at T7 and T11. There is increased, moderate height loss of comminuted fracture seen at L1, which was probably subacute at the time of imaging dated 08/30/2022, with other fracture deformities unchanged. No new fracture of the thoracic or lumbar spine. Disc spaces: Minimal disc space height loss and endplate osteophytosis throughout the thoracic and lumbar spine. Paraspinous soft tissues: Small bilateral pleural effusions. Coronary artery disease. Aortic atherosclerosis. IMPRESSION: 1. Numerous thoracic and lumbar wedge fractures, including of T5, T7, T11, T12, L1, and L3, particularly notable for near vertebral plana deformities at T7 and T11. 2. There is increased, moderate height loss of comminuted fracture seen at L1, which was probably subacute at the time of imaging dated 08/30/2022, with other fracture deformities unchanged. 3. No new fracture of the thoracic or lumbar spine. 4. Small bilateral pleural effusions. 5. Coronary artery disease. Aortic Atherosclerosis (ICD10-I70.0). Electronically Signed   By: Jearld Lesch M.D.   On: 09/29/2022 15:40   CT Cervical Spine Wo Contrast  Result Date: 09/29/2022 CLINICAL DATA:  Fall, pain, back injury, spine fracture EXAM: CT HEAD WITHOUT CONTRAST CT CERVICAL SPINE WITHOUT CONTRAST TECHNIQUE: Multidetector CT imaging of the head and cervical spine was performed following the standard protocol without intravenous contrast. Multiplanar CT image reconstructions of the cervical spine  were also generated. RADIATION DOSE REDUCTION: This exam was performed according to the departmental dose-optimization program which includes automated exposure control,  adjustment of the mA and/or kV according to patient size and/or use of iterative reconstruction technique. COMPARISON:  08/31/2022 FINDINGS: CT HEAD FINDINGS Brain: No evidence of acute infarction, hemorrhage, hydrocephalus, extra-axial collection or mass lesion/mass effect. Unchanged focal hypodensity left frontal corona radiata (series 2, 20). Periventricular white matter hypodensity. Vascular: No hyperdense vessel or unexpected calcification. Skull: Normal. Negative for fracture or focal lesion. Sinuses/Orbits: No acute finding. Other: None. CT CERVICAL SPINE FINDINGS Alignment: Normal. Skull base and vertebrae: No acute fracture. No primary bone lesion or focal pathologic process. Soft tissues and spinal canal: No prevertebral fluid or swelling. No visible canal hematoma. Disc levels: Moderate disc space height loss and osteophytosis of the lower cervical levels. Upper chest: Negative. Other: None. IMPRESSION: 1. No acute intracranial pathology. Small-vessel white matter disease and unchanged focal hypodensity left frontal corona radiata, keeping with chronic infarction. 2. No fracture or static subluxation of the cervical spine. 3. 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 Electronically Signed   By: Jearld Lesch M.D.   On: 09/29/2022 15:34   CT Head Wo Contrast  Result Date: 09/29/2022 CLINICAL DATA:  Fall, pain, back injury, spine fracture EXAM: CT HEAD WITHOUT CONTRAST CT CERVICAL SPINE WITHOUT CONTRAST TECHNIQUE: Multidetector CT imaging of the head and cervical spine was performed following the standard protocol without intravenous contrast. Multiplanar CT image reconstructions of the cervical spine were also generated. RADIATION DOSE REDUCTION: This exam was performed according to the departmental dose-optimization program which includes automated exposure control, adjustment of the mA and/or kV according to patient size and/or use of iterative reconstruction technique.  COMPARISON:  08/31/2022 FINDINGS: CT HEAD FINDINGS Brain: No evidence of acute infarction, hemorrhage, hydrocephalus, extra-axial collection or mass lesion/mass effect. Unchanged focal hypodensity left frontal corona radiata (series 2, 20). Periventricular white matter hypodensity. Vascular: No hyperdense vessel or unexpected calcification. Skull: Normal. Negative for fracture or focal lesion. Sinuses/Orbits: No acute finding. Other: None. CT CERVICAL SPINE FINDINGS Alignment: Normal. Skull base and vertebrae: No acute fracture. No primary bone lesion or focal pathologic process. Soft tissues and spinal canal: No prevertebral fluid or swelling. No visible canal hematoma. Disc levels: Moderate disc space height loss and osteophytosis of the lower cervical levels. Upper chest: Negative. Other: None. IMPRESSION: 1. No acute intracranial pathology. Small-vessel white matter disease and unchanged focal hypodensity left frontal corona radiata, keeping with chronic infarction. 2. No fracture or static subluxation of the cervical spine. 3. 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 Electronically Signed   By: Jearld Lesch M.D.   On: 09/29/2022 15:34        Scheduled Meds:  amLODipine  5 mg Oral Daily   carbidopa-levodopa  1.5 tablet Oral TID   clonazepam  0.25 mg Oral q AM   And   clonazePAM  0.5 mg Oral QHS   FLUoxetine  40 mg Oral BID   Continuous Infusions:   LOS: 0 days    Time spent: 51 minutes spent on chart review, discussion with nursing staff, consultants, updating family and interview/physical exam; more than 50% of that time was spent in counseling and/or coordination of care.    Alvira Philips [place], DO [facility] Available via Epic secure chat 7am-7pm After these hours, please refer to coverage provider listed on [email or web address] 09/30/2022, 9:53 AM

## 2022-09-30 NOTE — Progress Notes (Signed)
    Request received for L1 KP/possible T12  Dr Corliss Skains has reviewed imaging and feels additional films are need to fully evaluate compression fractures  T8-S1 MRI has been ordered. Will await these results and plan accordingly.  Precertification of Insurance will have to take place before procedure is scheduled.

## 2022-09-30 NOTE — Plan of Care (Signed)

## 2022-09-30 NOTE — Transportation (Signed)
1825: left unit for MRI

## 2022-09-30 NOTE — Progress Notes (Signed)
Subjective: Patient reports patient confused at baseline Parkinson's  Objective: Vital signs in last 24 hours: Temp:  [97.8 F (36.6 C)-98.4 F (36.9 C)] 97.8 F (36.6 C) (08/15 0350) Pulse Rate:  [58-68] 58 (08/15 0350) Resp:  [15-18] 16 (08/15 0350) BP: (105-152)/(63-88) 124/77 (08/15 0350) SpO2:  [94 %-98 %] 98 % (08/15 0350) Weight:  [44.6 kg-46.3 kg] 44.6 kg (08/14 1819)  Intake/Output from previous day: 08/14 0701 - 08/15 0700 In: 118 [P.O.:118] Out: 200 [Urine:200] Intake/Output this shift: No intake/output data recorded.  Lower extremities move well symmetric strength.  Lab Results: Recent Labs    09/29/22 1157  WBC 6.2  HGB 13.3  HCT 40.6  PLT 263   BMET Recent Labs    09/29/22 1157  NA 139  K 4.5  CL 102  CO2 27  GLUCOSE 106*  BUN 19  CREATININE 0.85  CALCIUM 9.0    Studies/Results: CT Lumbar Spine Wo Contrast  Result Date: 09/29/2022 CLINICAL DATA:  Trauma, fall, pain, history of compression fractures EXAM: CT THORACIC AND LUMBAR SPINE WITHOUT CONTRAST TECHNIQUE: Multidetector CT imaging of the thoracic and lumbar spine was performed without intravenous contrast administration. Multiplanar CT image reconstructions were also generated. RADIATION DOSE REDUCTION: This exam was performed according to the departmental dose-optimization program which includes automated exposure control, adjustment of the mA and/or kV according to patient size and/or use of iterative reconstruction technique. COMPARISON:  08/30/2022 FINDINGS: Alignment: Slightly exaggerated thoracic kyphosis secondary to multiple wedge deformities. Normal lumbar lordosis. Vertebral bodies: Osteopenia. Numerous thoracic and lumbar wedge fractures, including of T5, T7, T11, T12, L1, and L3, particularly notable for near vertebral plana deformities at T7 and T11. There is increased, moderate height loss of comminuted fracture seen at L1, which was probably subacute at the time of imaging dated  08/30/2022, with other fracture deformities unchanged. No new fracture of the thoracic or lumbar spine. Disc spaces: Minimal disc space height loss and endplate osteophytosis throughout the thoracic and lumbar spine. Paraspinous soft tissues: Small bilateral pleural effusions. Coronary artery disease. Aortic atherosclerosis. IMPRESSION: 1. Numerous thoracic and lumbar wedge fractures, including of T5, T7, T11, T12, L1, and L3, particularly notable for near vertebral plana deformities at T7 and T11. 2. There is increased, moderate height loss of comminuted fracture seen at L1, which was probably subacute at the time of imaging dated 08/30/2022, with other fracture deformities unchanged. 3. No new fracture of the thoracic or lumbar spine. 4. Small bilateral pleural effusions. 5. Coronary artery disease. Aortic Atherosclerosis (ICD10-I70.0). Electronically Signed   By: Jearld Lesch M.D.   On: 09/29/2022 15:40   CT Thoracic Spine Wo Contrast  Result Date: 09/29/2022 CLINICAL DATA:  Trauma, fall, pain, history of compression fractures EXAM: CT THORACIC AND LUMBAR SPINE WITHOUT CONTRAST TECHNIQUE: Multidetector CT imaging of the thoracic and lumbar spine was performed without intravenous contrast administration. Multiplanar CT image reconstructions were also generated. RADIATION DOSE REDUCTION: This exam was performed according to the departmental dose-optimization program which includes automated exposure control, adjustment of the mA and/or kV according to patient size and/or use of iterative reconstruction technique. COMPARISON:  08/30/2022 FINDINGS: Alignment: Slightly exaggerated thoracic kyphosis secondary to multiple wedge deformities. Normal lumbar lordosis. Vertebral bodies: Osteopenia. Numerous thoracic and lumbar wedge fractures, including of T5, T7, T11, T12, L1, and L3, particularly notable for near vertebral plana deformities at T7 and T11. There is increased, moderate height loss of comminuted fracture  seen at L1, which was probably subacute at the time of imaging  dated 08/30/2022, with other fracture deformities unchanged. No new fracture of the thoracic or lumbar spine. Disc spaces: Minimal disc space height loss and endplate osteophytosis throughout the thoracic and lumbar spine. Paraspinous soft tissues: Small bilateral pleural effusions. Coronary artery disease. Aortic atherosclerosis. IMPRESSION: 1. Numerous thoracic and lumbar wedge fractures, including of T5, T7, T11, T12, L1, and L3, particularly notable for near vertebral plana deformities at T7 and T11. 2. There is increased, moderate height loss of comminuted fracture seen at L1, which was probably subacute at the time of imaging dated 08/30/2022, with other fracture deformities unchanged. 3. No new fracture of the thoracic or lumbar spine. 4. Small bilateral pleural effusions. 5. Coronary artery disease. Aortic Atherosclerosis (ICD10-I70.0). Electronically Signed   By: Jearld Lesch M.D.   On: 09/29/2022 15:40   CT Cervical Spine Wo Contrast  Result Date: 09/29/2022 CLINICAL DATA:  Fall, pain, back injury, spine fracture EXAM: CT HEAD WITHOUT CONTRAST CT CERVICAL SPINE WITHOUT CONTRAST TECHNIQUE: Multidetector CT imaging of the head and cervical spine was performed following the standard protocol without intravenous contrast. Multiplanar CT image reconstructions of the cervical spine were also generated. RADIATION DOSE REDUCTION: This exam was performed according to the departmental dose-optimization program which includes automated exposure control, adjustment of the mA and/or kV according to patient size and/or use of iterative reconstruction technique. COMPARISON:  08/31/2022 FINDINGS: CT HEAD FINDINGS Brain: No evidence of acute infarction, hemorrhage, hydrocephalus, extra-axial collection or mass lesion/mass effect. Unchanged focal hypodensity left frontal corona radiata (series 2, 20). Periventricular white matter hypodensity. Vascular: No  hyperdense vessel or unexpected calcification. Skull: Normal. Negative for fracture or focal lesion. Sinuses/Orbits: No acute finding. Other: None. CT CERVICAL SPINE FINDINGS Alignment: Normal. Skull base and vertebrae: No acute fracture. No primary bone lesion or focal pathologic process. Soft tissues and spinal canal: No prevertebral fluid or swelling. No visible canal hematoma. Disc levels: Moderate disc space height loss and osteophytosis of the lower cervical levels. Upper chest: Negative. Other: None. IMPRESSION: 1. No acute intracranial pathology. Small-vessel white matter disease and unchanged focal hypodensity left frontal corona radiata, keeping with chronic infarction. 2. No fracture or static subluxation of the cervical spine. 3. 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 Electronically Signed   By: Jearld Lesch M.D.   On: 09/29/2022 15:34   CT Head Wo Contrast  Result Date: 09/29/2022 CLINICAL DATA:  Fall, pain, back injury, spine fracture EXAM: CT HEAD WITHOUT CONTRAST CT CERVICAL SPINE WITHOUT CONTRAST TECHNIQUE: Multidetector CT imaging of the head and cervical spine was performed following the standard protocol without intravenous contrast. Multiplanar CT image reconstructions of the cervical spine were also generated. RADIATION DOSE REDUCTION: This exam was performed according to the departmental dose-optimization program which includes automated exposure control, adjustment of the mA and/or kV according to patient size and/or use of iterative reconstruction technique. COMPARISON:  08/31/2022 FINDINGS: CT HEAD FINDINGS Brain: No evidence of acute infarction, hemorrhage, hydrocephalus, extra-axial collection or mass lesion/mass effect. Unchanged focal hypodensity left frontal corona radiata (series 2, 20). Periventricular white matter hypodensity. Vascular: No hyperdense vessel or unexpected calcification. Skull: Normal. Negative for fracture or focal lesion.  Sinuses/Orbits: No acute finding. Other: None. CT CERVICAL SPINE FINDINGS Alignment: Normal. Skull base and vertebrae: No acute fracture. No primary bone lesion or focal pathologic process. Soft tissues and spinal canal: No prevertebral fluid or swelling. No visible canal hematoma. Disc levels: Moderate disc space height loss and osteophytosis of the lower cervical levels. Upper chest: Negative.  Other: None. IMPRESSION: 1. No acute intracranial pathology. Small-vessel white matter disease and unchanged focal hypodensity left frontal corona radiata, keeping with chronic infarction. 2. No fracture or static subluxation of the cervical spine. 3. 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 Electronically Signed   By: Jearld Lesch M.D.   On: 09/29/2022 15:34    Assessment/Plan: Continue physical therapy we will get her a lumbar corset interventional radiology consult to consider kyphoplasty but the only eligible lesions would be the T12-L1 the vertebral plana T11 would not be eligible for kyphoplasty  LOS: 0 days     Mariam Dollar 09/30/2022, 7:59 AM

## 2022-09-30 NOTE — TOC Initial Note (Signed)
Transition of Care [facility]) - Initial/Assessment Note    Patient Details  Name: Patricia Fitzpatrick MRN: [id number] Date of Birth: Jun 22, 1945  Transition of Care [facility]) CM/SW Contact:    Baldemar Lenis, LCSW Phone Number: 09/30/2022, 3:02 PM  Clinical Narrative:     CSW attempted to meet with patient, she was off the floor. CSW spoke with stepdaughter, [name], to obtain information about what occurred with patient's recent SNF stay and discharge home. Per stepdaughter, patient said she had been ambulatory and discharged home. Patient was supposed to meet with neurologist for Parkinson's symptoms but then couldn't get out of the house to the car, so family brought her back to the ED. Family is agreeable to return to SNF if recommended, but unsure about what's being recommended right now as so much is up in the air on whether the patient is having this procedure or not. 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 CSW to continue to follow.         Expected Discharge Plan: Skilled Nursing Facility Barriers to Discharge: Continued Medical Work up, English as a second language teacher   Patient Goals and CMS Choice Patient states their goals for this hospitalization and ongoing recovery are:: get stronger CMS Medicare.gov Compare Post Acute Care list provided to:: Patient Choice offered to / list presented to : Patient, Adult Children Clarinda ownership interest in [facility].provided to:: Patient    Expected Discharge Plan and Services     Post Acute Care Choice: Skilled Nursing Facility Living arrangements for the past 2 months: Single Family Home                                      Prior Living Arrangements/Services Living arrangements for the past 2 months: Single Family Home Lives with:: Siblings Patient language and need for interpreter reviewed:: No Do you feel safe going back to the place  where you live?: Yes      Need for Family Participation in Patient Care: No (Comment) Care giver support system in place?: No (comment)   Criminal Activity/Legal Involvement Pertinent to Current Situation/Hospitalization: No - Comment as needed  Activities of Daily Living Home Assistive Devices/Equipment: Environmental consultant (specify type), Shower chair with back ADL Screening (condition at time of admission) Patient's cognitive ability adequate to safely complete daily activities?: Yes Is the patient deaf or have difficulty hearing?: No Does the patient have difficulty seeing, even when wearing glasses/contacts?: No Does the patient have difficulty concentrating, remembering, or making decisions?: Yes Patient able to express need for assistance with ADLs?: Yes Does the patient have difficulty dressing or bathing?: Yes Independently performs ADLs?: No Communication: Independent Dressing (OT): Needs assistance Is this a change from baseline?: Pre-admission baseline Grooming: Needs assistance Is this a change from baseline?: Pre-admission baseline Feeding: Needs assistance Is this a change from baseline?: Pre-admission baseline Bathing: Needs assistance Is this a change from baseline?: Pre-admission baseline Toileting: Needs assistance Is this a change from baseline?: Pre-admission baseline In/Out Bed: Needs assistance Is this a change from baseline?: Pre-admission baseline Walks in Home: Needs assistance Is this a change from baseline?: Pre-admission baseline Does the patient have difficulty walking or climbing stairs?: Yes Weakness of Legs: Both Weakness of Arms/Hands: Both  Permission Sought/Granted Permission sought to share information with : Magazine features editor, Family  Supports Permission granted to share information with : Yes, Verbal Permission Granted  Share Information with NAME: [name]  Permission granted to share info w AGENCY: SNF  Permission granted to share info w  Relationship: Step-daughter     Emotional Assessment Appearance:: Appears stated age Attitude/Demeanor/Rapport: Engaged Affect (typically observed): Appropriate Orientation: : Oriented to Self, Oriented to Place, Oriented to  Time, Oriented to Situation Alcohol / Substance Use: Never Used Psych Involvement: No (comment)  Admission diagnosis:  Fall in home, initial encounter [W19.XXXA, Y92.009] Lumbar compression fracture, closed, initial encounter (HCC) [S32.000A] Compression fracture of body of thoracic vertebra (HCC) [S22.000A] Compression fracture of lumbar vertebra, unspecified lumbar vertebral level, initial encounter [facility]) [S32.000A] Patient Active Problem List   Diagnosis Date Noted   Lumbar compression fracture, closed, initial encounter (HCC) 09/29/2022   Pubic ramus fracture, left, with routine healing, subsequent encounter 08/31/2022   Closed compression fracture of L1 lumbar vertebra, initial encounter (HCC) 08/31/2022   Dehydration 08/31/2022   Fall at home, subsequent encounter 08/30/2022   Delirium 08/25/2020   Closed displaced fracture of greater trochanter of left femur (HCC) 08/22/2020   Hypertension    Depression    Osteoporosis    Parkinson's disease 10/14/2015   Tremor 10/14/2015   Memory change 10/14/2015   PCP:  [name], MD Pharmacy:   [facility] [phone] - [place], [place] - [address] AT [facility] [address] [place] [address] Phone: [phone] Fax: [phone]     Social Determinants of Health (SDOH) Social History: SDOH Screenings   Food Insecurity: No Food Insecurity (09/30/2022)  Housing: Low Risk  (09/30/2022)  Transportation Needs: No Transportation Needs (09/30/2022)  Utilities: Not At Risk (09/30/2022)  Depression (PHQ2-9): High Risk (08/17/2022)  Tobacco Use: Low Risk  (09/30/2022)   SDOH Interventions:     Readmission Risk Interventions     No data to display

## 2022-09-30 NOTE — Transportation (Addendum)
1230: Left the unit for X-rays.  1310: Back from Xray.

## 2022-09-30 NOTE — Evaluation (Signed)
Occupational Therapy Evaluation Patient Details Name: Patricia Fitzpatrick MRN: [id number] DOB: June 10, 1945 Today's Date: 09/30/2022   History of Present Illness 77 y.o. female presents to [facility] hospital on 09/29/2022 with progressive difficulty walking. 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 Pt fell recently (08/2022) and was diagnosed with pubic ramus fx, L1 compression fx and L femur greater trochanter fx (treated conservatively with PWB'ing went to SNF, D/C'd from [facility] 09/27/2022. PMH includes anxiety, depression, HLD, HTN, parkinsonism.   Clinical Impression   This 77 yo female admitted with above presents to acute OT with PLOF of just returned home from SNF for rehab and was unable to stand or ambulate so came back in to hospital and found to have additional fractures of spine. For the 2 days she was at home she needed increased A for all basic ADLs and transfers. She currently is min A-total A at bed level for all ADLs, has decreased sitting balance at EOB, and unable to stand on her own. She will continue to benefit from acute OT with follow up from continued inpatient follow up therapy, <3 hours/day.       If plan is discharge home, recommend the following: A lot of help with walking and/or transfers;A lot of help with bathing/dressing/bathroom;Assistance with cooking/housework;Assistance with feeding;Help with stairs or ramp for entrance;Assist for transportation;Direct supervision/assist for financial management;Direct supervision/assist for medications management    Functional Status Assessment  Patient has had a recent decline in their functional status and demonstrates the ability to make significant improvements in function in a reasonable and predictable amount of time.  Equipment Recommendations  Other (comment) (TBD next venue)        Precautions / Restrictions Precautions Precautions: Fall;Back Precaution Comments: no active L hip abduction (from last admission 4 weeks ago-ortho to have new xray done to see if precaution is still applicable) Required Braces or Orthoses: Spinal Brace Spinal Brace: Thoracolumbosacral orthotic;Applied in sitting position (step-daughter to bring it in to see if therapy can get it fit patient better (chest portion) because pt was not tolerating it at SNF) Restrictions Weight Bearing Restrictions: Yes LLE Weight Bearing: Partial weight bearing LLE Partial Weight Bearing Percentage or Pounds: 50 Other Position/Activity Restrictions: no active L hip abduction; (Precautions and WB restrictions obtained from recent admission.)--ortho getting another x-ray this admission      Mobility Bed Mobility Overal bed mobility: Needs Assistance Bed Mobility: Rolling, Sidelying to Sit, Sit to Sidelying Rolling: Mod assist Sidelying to sit: Mod assist, HOB elevated, Used rails     Sit to sidelying: Mod assist General bed mobility comments: Educated on letting staff move her left leg out to left for her (no AROM for abduction); rolling to left side for back precautions and then coming up to sit (reverse for laying back down)    Transfers Overall transfer level: Needs assistance Equipment used: None Transfers: Sit to/from Stand Sit to Stand: Mod assist                  Balance Overall balance assessment: Needs assistance Sitting-balance support: Bilateral upper extremity supported, Feet supported Sitting balance-Leahy Scale: Poor Sitting balance - Comments: posterior lean needing up to Mod A to correct and vc's   Standing balance support: Bilateral upper extremity supported (therapist standing in front of her) Standing balance-Leahy Scale: Poor Standing balance comment: posterior  lean                           ADL either performed or assessed with clinical judgement    ADL Overall ADL's : Needs assistance/impaired Eating/Feeding: Minimal assistance;Bed level   Grooming: Minimal assistance;Bed level   Upper Body Bathing: Moderate assistance;Bed level   Lower Body Bathing: Total assistance;Bed level   Upper Body Dressing : Total assistance;Bed level   Lower Body Dressing: Total assistance;Bed level                       Vision Baseline Vision/History: 1 Wears glasses Vision Assessment?: No apparent visual deficits            Pertinent Vitals/Pain Pain Assessment Pain Assessment: Faces Faces Pain Scale: Hurts a little bit Pain Location: back, (1 Lt hip) Pain Descriptors / Indicators: Aching Pain Intervention(s): Monitored during session, Repositioned, Limited activity within patient's tolerance     Extremity/Trunk Assessment Upper Extremity Assessment Upper Extremity Assessment: Generalized weakness   Lower Extremity Assessment Lower Extremity Assessment: Generalized weakness (Poor sequencing at times.)       Communication Communication Communication:  (slow monotoned speech) Cueing Techniques: Verbal cues;Tactile cues;Gestural cues   Cognition Arousal: Alert Behavior During Therapy: WFL for tasks assessed/performed, Flat affect Overall Cognitive Status: No family/caregiver present to determine baseline cognitive functioning Area of Impairment: Attention, Memory, Following commands, Safety/judgement, Awareness, Problem solving                   Current Attention Level: Sustained Memory: Decreased short-term memory, Decreased recall of precautions Following Commands: Follows multi-step commands inconsistently, Follows one step commands consistently Safety/Judgement: Decreased awareness of safety, Decreased awareness of deficits Awareness: Emergent Problem Solving: Slow processing, Difficulty sequencing, Requires verbal cues General Comments: No recall of her spinal precautions or hip precautions from prior  admission --reviewed these with her.     General Comments  OT spoke with step dtr over the phone, seeking clarification of therapy goals/limits; she will bring brace and we can adjust due to it apparently fitting poorly and was not being used.            Home Living Family/patient expects to be discharged to:: Skilled nursing facility Living Arrangements: Children;Other relatives (brother) Available Help at Discharge: Family;Available 24 hours/day (step daughter and brother (who has his own health issues)) Type of Home: House Home Access: Stairs to enter [employer] of Steps: 3 Entrance Stairs-Rails: Can reach both Home Layout: Two level;Able to live on main level with bedroom/bathroom     Bathroom Shower/Tub: Chief Strategy Officer: Standard     Home Equipment: Agricultural consultant (2 wheels);Rollator (4 wheels);Cane - quad;Shower seat   Additional Comments: Reports 2 falls at home when brother assisting with transfers (stepdauthger      Prior Functioning/Environment Prior Level of Function : Needs assist             Mobility Comments: Reports ambulating with RW and assist, ind with w/c at SNF; After returning home states she was having trouble getting out of bed and had 2 falls with brother. Difficulty recalling additional information ADLs Comments: Does not recall - indicates she does not use shower chair at home out of fear of falling.        OT Problem List: Decreased strength;Decreased range of motion;Decreased activity tolerance;Impaired balance (sitting and/or standing);Decreased cognition;Decreased safety awareness;Decreased knowledge of precautions;Pain  OT Treatment/Interventions: Self-care/ADL training;DME and/or AE instruction;Balance training;Patient/family education    OT Goals(Current goals can be found in the care plan section) Acute Rehab OT Goals Patient Stated Goal: to be able to walk again OT Goal Formulation: With  patient Time For Goal Achievement: 10/14/22 Potential to Achieve Goals: Fair  OT Frequency: Min 1X/week    Co-evaluation PT/OT/SLP Co-Evaluation/Treatment: Yes Reason for Co-Treatment: Complexity of the patient's impairments (multi-system involvement);For patient/therapist safety;To address functional/ADL transfers PT goals addressed during session: Mobility/safety with mobility;Balance;Strengthening/ROM OT goals addressed during session: Strengthening/ROM;ADL's and self-care      AM-PAC OT "6 Clicks" Daily Activity     Outcome Measure Help from another person eating meals?: A Little Help from another person taking care of personal grooming?: A Little Help from another person toileting, which includes using toliet, bedpan, or urinal?: A Lot Help from another person bathing (including washing, rinsing, drying)?: A Lot Help from another person to put on and taking off regular upper body clothing?: Total Help from another person to put on and taking off regular lower body clothing?: Total 6 Click Score: 12   End of Session Nurse Communication: Mobility status (need clarifcation on restrictions for LLE (has been 4 weeks since first fractured); need brace from home)  Activity Tolerance: Patient limited by fatigue Patient left: in bed;with call bell/phone within reach;with bed alarm set  OT Visit Diagnosis: Other abnormalities of gait and mobility (R26.89);Unsteadiness on feet (R26.81);Repeated falls (R29.6);Muscle weakness (generalized) (M62.81);History of falling (Z91.81);Other symptoms and signs involving cognitive function;Pain Pain - part of body:  (back and left hip)                Time: 1478-2956 OT Time Calculation (min): 40 min Charges:  OT General Charges $OT Visit: 1 Visit OT Evaluation $OT Eval Moderate Complexity: 1 Mod OT Treatments $Self Care/Home Management : 8-22 mins  Lindon Romp OT Acute Rehabilitation Services Office [phone]    Evette Georges 09/30/2022, 12:39 PM

## 2022-09-30 NOTE — Evaluation (Signed)
Physical Therapy Evaluation Patient Details Name: Patricia Fitzpatrick MRN: [id number] DOB: [birth year] Today's Date: 09/30/2022  History of Present Illness  77 y.o. female presents to [facility] hospital on 09/29/2022 with progressive difficulty walking. 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 Pt fell recently (08/2022) and was diagnosed with pubic ramus fx, L1 compression fx and L femur greater trochanter fx (treated conservatively with PWB'ing went to SNF, D/C'd from [facility] 09/27/2022. PMH includes anxiety, depression, HLD, HTN, parkinsonism.  Clinical Impression  Pt admitted with above diagnosis. Reports she was ambulatory with light assist and independently mobilizing with W/c at SNF prior to d/c home. Once home she had trouble getting out of bed and reports 2 falls at home. Limited history and recall of events provided today. Mild/minimal back and Lt hip pain. Awaiting back brace delivery by step-daughter before progressing further. Able to perform bed mobility and sit<>stand with up to mod assist +2 today. Educated on log roll for back precautions. Linens adjusted and OT replaced purewick, returned safely to bed, comfortable. PWB Lt 50%, no active Lt hip abduction, staff notified until further clarification. Patient will benefit from continued inpatient follow up therapy, <3 hours/day. Pt currently with functional limitations due to the deficits listed below (see PT Problem List). Pt will benefit from acute skilled PT to increase their independence and safety with mobility to allow discharge.           If plan is discharge home, recommend the following: A lot of help with walking and/or transfers;A lot of help with bathing/dressing/bathroom;Assistance with cooking/housework;Help with stairs or ramp for entrance;Assist for transportation   Can travel by  private vehicle   No (Will update following brace delivery and progress)    Equipment Recommendations Wheelchair (measurements PT);Wheelchair cushion (measurements PT);BSC/3in1  Recommendations for Other Services       Functional Status Assessment Patient has had a recent decline in their functional status and demonstrates the ability to make significant improvements in function in a reasonable and predictable amount of time.     Precautions / Restrictions Precautions Precautions: Fall;Back Precaution Comments: no active L hip abduction (from last admission 4 weeks ago-ortho to have new xray done to see if precaution is still applicable) Required Braces or Orthoses: Spinal Brace Spinal Brace: Thoracolumbosacral orthotic;Applied in sitting position (step-daughter to bring it in to see if therapy can get it fit patient better (chest portion) because pt was not tolerating it at SNF) Restrictions Weight Bearing Restrictions: Yes LLE Weight Bearing: Partial weight bearing LLE Partial Weight Bearing Percentage or Pounds: 50 Other Position/Activity Restrictions: no active L hip abduction; (Precautions and WB restrictions obtained from recent admission.)--ortho getting another x-ray this admission      Mobility  Bed Mobility Overal bed mobility: Needs Assistance Bed Mobility: Rolling, Sidelying to Sit, Sit to Sidelying Rolling: Min assist, Used rails Sidelying to sit: Mod assist, +2 for physical assistance, Used rails     Sit to sidelying: Mod assist, +2 for physical assistance General bed mobility comments: Educated on log roll technique up to +2 mod assist for transitions with trunk and LEs. Use of rail with RUE to roll. Cues to avoid active Lt hip abduction; assisted passively.    Transfers Overall transfer level: Needs assistance Equipment used: None, 1 person hand held assist Transfers: Sit to/from Stand Sit to Stand: Mod assist  General transfer comment: Mod  assist for boost to stand, heavy posterior lean, pt hands around therapist waist to stability, second person changed/straightened linens, replaced purewick. Gradually improved to Min assist for balance in standing at EOB, Cues for PWB on Lt.    Ambulation/Gait               General Gait Details: Deferred until brace delivered - dtr to bring  Stairs            Wheelchair Mobility     Tilt Bed    Modified Rankin (Stroke Patients Only)       Balance Overall balance assessment: Needs assistance Sitting-balance support: Single extremity supported, Feet supported Sitting balance-Leahy Scale: Poor Sitting balance - Comments: Progressed from min assist to CGA level assist. Worked on sitting upright with tall posture, reaching forward to facilitate forward weight shift and self stabilization. Postural control: Posterior lean Standing balance support: Bilateral upper extremity supported Standing balance-Leahy Scale: Poor Standing balance comment: Mod-Min assist with practice, posterior lean.                             Pertinent Vitals/Pain Pain Assessment Pain Assessment: Faces Faces Pain Scale: Hurts a little bit (States was as high as 8/10 if staff move her wrong at SNF) Pain Location: back, (1 Lt hip) Pain Descriptors / Indicators: Aching Pain Intervention(s): Monitored during session, Repositioned, Limited activity within patient's tolerance    Home Living Family/patient expects to be discharged to:: Skilled nursing facility Living Arrangements: Children;Other relatives (brother) Available Help at Discharge: Family;Available 24 hours/day (step daughter and brother (who has his own health issues)) Type of Home: House Home Access: Stairs to enter Entrance Stairs-Rails: Can reach both Entrance Stairs-Number of Steps: 3   Home Layout: Two level;Able to live on main level with bedroom/bathroom Home Equipment: Rolling Walker (2 wheels);Rollator (4  wheels);Cane - quad;Shower seat Additional Comments: Reports 2 falls at home when brother assisting with transfers (stepdauthger    Prior Function Prior Level of Function : Needs assist             Mobility Comments: Reports ambulating with RW and assist, ind with w/c at SNF; After returning home states she was having trouble getting out of bed and had 2 falls with brother. Difficulty recalling additional information ADLs Comments: Does not recall - indicates she does not use shower chair at home out of fear of falling.     Extremity/Trunk Assessment   Upper Extremity Assessment Upper Extremity Assessment: Generalized weakness    Lower Extremity Assessment Lower Extremity Assessment: Generalized weakness (Poor sequencing at times.)       Communication   Communication Communication:  (slow monotoned speech) Cueing Techniques: Verbal cues;Tactile cues;Gestural cues  Cognition Arousal: Alert Behavior During Therapy: WFL for tasks assessed/performed, Flat affect Overall Cognitive Status: No family/caregiver present to determine baseline cognitive functioning                                 General Comments: Inconsistently following motor commands. Takes a little time to answer questions, suspect related to PD though. Overall history provided was somewhat limited due to reduced recall.        General Comments General comments (skin integrity, edema, etc.): OT spoke with step dtr over the phone, seeking clarification of therapy goals/limits; she will bring brace and we can adjust due to it  apparently fitting poorly and was not being used.    Exercises General Exercises - Lower Extremity Ankle Circles/Pumps: AROM, Both, 15 reps, Supine Quad Sets: Strengthening, Both, 10 reps, Supine Gluteal Sets: Strengthening, Both, Supine, 10 reps   Assessment/Plan    PT Assessment Patient needs continued PT services  PT Problem List Decreased strength;Decreased activity  tolerance;Decreased mobility;Decreased balance;Decreased cognition;Decreased knowledge of use of DME;Decreased safety awareness;Decreased knowledge of precautions;Pain;Decreased range of motion;Decreased coordination       PT Treatment Interventions Gait training;DME instruction;Functional mobility training;Therapeutic activities;Therapeutic exercise;Balance training;Neuromuscular re-education;Patient/family education;Cognitive remediation    PT Goals (Current goals can be found in the Care Plan section)  Acute Rehab PT Goals Patient Stated Goal: Get stronger, be independent PT Goal Formulation: With patient Time For Goal Achievement: 10/14/22 Potential to Achieve Goals: Fair    Frequency Min 1X/week     Co-evaluation PT/OT/SLP Co-Evaluation/Treatment: Yes Reason for Co-Treatment: Complexity of the patient's impairments (multi-system involvement);For patient/therapist safety;To address functional/ADL transfers PT goals addressed during session: Mobility/safety with mobility;Balance;Strengthening/ROM OT goals addressed during session: Strengthening/ROM;ADL's and self-care       AM-PAC PT "6 Clicks" Mobility  Outcome Measure Help needed turning from your back to your side while in a flat bed without using bedrails?: A Little Help needed moving from lying on your back to sitting on the side of a flat bed without using bedrails?: A Lot Help needed moving to and from a bed to a chair (including a wheelchair)?: A Lot Help needed standing up from a chair using your arms (e.g., wheelchair or bedside chair)?: A Lot Help needed to walk in hospital room?: Total Help needed climbing 3-5 steps with a railing? : Total 6 Click Score: 11    End of Session   Activity Tolerance: Patient tolerated treatment well;Other (comment) (Limited by need for spinal brace before working on transfers and gait. Step dtr to bring) Patient left: in bed;with call bell/phone within reach;with bed alarm set Nurse  Communication: Mobility status;Precautions;Weight bearing status PT Visit Diagnosis: Other abnormalities of gait and mobility (R26.89);Muscle weakness (generalized) (M62.81);Other symptoms and signs involving the nervous system (R29.898);Unsteadiness on feet (R26.81);Difficulty in walking, not elsewhere classified (R26.2);Repeated falls (R29.6);History of falling (Z91.81);Pain Pain - part of body:  (back and Lt hip)    Time: 1478-2956 PT Time Calculation (min) (ACUTE ONLY): 40 min   Charges:   PT Evaluation $PT Eval Moderate Complexity: 1 Mod   PT General Charges $$ ACUTE PT VISIT: 1 Visit         Kathlyn Sacramento, PT, DPT [facility]  Rehabilitation Services Physical Therapist Office: [phone] Website: [email or web address]   Berton Mount 09/30/2022, 12:39 PM

## 2022-10-01 DIAGNOSIS — I10 Essential (primary) hypertension: Secondary | ICD-10-CM | POA: Diagnosis present

## 2022-10-01 DIAGNOSIS — E7800 Pure hypercholesterolemia, unspecified: Secondary | ICD-10-CM | POA: Diagnosis present

## 2022-10-01 DIAGNOSIS — Z8249 Family history of ischemic heart disease and other diseases of the circulatory system: Secondary | ICD-10-CM | POA: Diagnosis not present

## 2022-10-01 DIAGNOSIS — S32000A Wedge compression fracture of unspecified lumbar vertebra, initial encounter for closed fracture: Secondary | ICD-10-CM | POA: Diagnosis not present

## 2022-10-01 DIAGNOSIS — R296 Repeated falls: Secondary | ICD-10-CM | POA: Diagnosis present

## 2022-10-01 DIAGNOSIS — W19XXXA Unspecified fall, initial encounter: Secondary | ICD-10-CM | POA: Diagnosis present

## 2022-10-01 DIAGNOSIS — Z79620 Long term (current) use of immunosuppressive biologic: Secondary | ICD-10-CM | POA: Diagnosis not present

## 2022-10-01 DIAGNOSIS — W19XXXD Unspecified fall, subsequent encounter: Secondary | ICD-10-CM | POA: Diagnosis present

## 2022-10-01 DIAGNOSIS — F419 Anxiety disorder, unspecified: Secondary | ICD-10-CM | POA: Diagnosis present

## 2022-10-01 DIAGNOSIS — S32592D Other specified fracture of left pubis, subsequent encounter for fracture with routine healing: Secondary | ICD-10-CM | POA: Diagnosis not present

## 2022-10-01 DIAGNOSIS — S72115D Nondisplaced fracture of greater trochanter of left femur, subsequent encounter for closed fracture with routine healing: Secondary | ICD-10-CM | POA: Diagnosis not present

## 2022-10-01 DIAGNOSIS — Z79899 Other long term (current) drug therapy: Secondary | ICD-10-CM | POA: Diagnosis not present

## 2022-10-01 DIAGNOSIS — M8008XA Age-related osteoporosis with current pathological fracture, vertebra(e), initial encounter for fracture: Secondary | ICD-10-CM | POA: Diagnosis present

## 2022-10-01 DIAGNOSIS — R54 Age-related physical debility: Secondary | ICD-10-CM | POA: Diagnosis present

## 2022-10-01 DIAGNOSIS — Z96642 Presence of left artificial hip joint: Secondary | ICD-10-CM | POA: Diagnosis present

## 2022-10-01 DIAGNOSIS — F32A Depression, unspecified: Secondary | ICD-10-CM | POA: Diagnosis present

## 2022-10-01 DIAGNOSIS — Z836 Family history of other diseases of the respiratory system: Secondary | ICD-10-CM | POA: Diagnosis not present

## 2022-10-01 DIAGNOSIS — I159 Secondary hypertension, unspecified: Secondary | ICD-10-CM | POA: Diagnosis not present

## 2022-10-01 DIAGNOSIS — Z7901 Long term (current) use of anticoagulants: Secondary | ICD-10-CM | POA: Diagnosis not present

## 2022-10-01 DIAGNOSIS — R251 Tremor, unspecified: Secondary | ICD-10-CM | POA: Diagnosis not present

## 2022-10-01 DIAGNOSIS — M549 Dorsalgia, unspecified: Secondary | ICD-10-CM | POA: Diagnosis present

## 2022-10-01 DIAGNOSIS — G20A1 Parkinson's disease without dyskinesia, without mention of fluctuations: Secondary | ICD-10-CM | POA: Diagnosis present

## 2022-10-01 DIAGNOSIS — Y92019 Unspecified place in single-family (private) house as the place of occurrence of the external cause: Secondary | ICD-10-CM | POA: Diagnosis not present

## 2022-10-01 DIAGNOSIS — Z820 Family history of epilepsy and other diseases of the nervous system: Secondary | ICD-10-CM | POA: Diagnosis not present

## 2022-10-01 LAB — PROTIME-INR
INR: 1 (ref 0.8–1.2)
Prothrombin Time: 13.5 seconds (ref 11.4–15.2)

## 2022-10-01 MED ORDER — CEFAZOLIN SODIUM-DEXTROSE 2-4 GM/100ML-% IV SOLN
2.0000 g | INTRAVENOUS | Status: AC
  Administered 2022-10-03: 2 g via INTRAVENOUS
  Filled 2022-10-01: qty 100

## 2022-10-01 MED ORDER — ENSURE ENLIVE PO LIQD
237.0000 mL | Freq: Two times a day (BID) | ORAL | Status: DC
  Administered 2022-10-02 – 2022-10-06 (×8): 237 mL via ORAL

## 2022-10-01 NOTE — Plan of Care (Signed)

## 2022-10-01 NOTE — Plan of Care (Signed)
  Problem: Education: Goal: Knowledge of General Education information will improve Description: Including pain rating scale, medication(s)/side effects and non-pharmacologic comfort measures Outcome: Progressing   Problem: Health Behavior/Discharge Planning: Goal: Ability to manage health-related needs will improve Outcome: Progressing   Problem: Health Behavior/Discharge Planning: Goal: Ability to manage health-related needs will improve Outcome: Progressing   Problem: Clinical Measurements: Goal: Ability to maintain clinical measurements within normal limits will improve Outcome: Progressing Goal: Will remain free from infection Outcome: Progressing Goal: Diagnostic test results will improve Outcome: Progressing Goal: Respiratory complications will improve Outcome: Progressing Goal: Cardiovascular complication will be avoided Outcome: Progressing   Problem: Nutrition: Goal: Adequate nutrition will be maintained Outcome: Progressing

## 2022-10-01 NOTE — Plan of Care (Signed)
  Problem: Education: Goal: Knowledge of General Education information will improve Description: Including pain rating scale, medication(s)/side effects and non-pharmacologic comfort measures Outcome: Progressing   Problem: Health Behavior/Discharge Planning: Goal: Ability to manage health-related needs will improve Outcome: Progressing   Problem: Clinical Measurements: Goal: Ability to maintain clinical measurements within normal limits will improve Outcome: Progressing Goal: Will remain free from infection Outcome: Progressing Goal: Diagnostic test results will improve Outcome: Progressing Goal: Respiratory complications will improve Outcome: Progressing   Problem: Activity: Goal: Risk for activity intolerance will decrease Outcome: Progressing   Problem: Coping: Goal: Level of anxiety will decrease Outcome: Progressing   Problem: Elimination: Goal: Will not experience complications related to bowel motility Outcome: Progressing Goal: Will not experience complications related to urinary retention Outcome: Progressing   Problem: Pain Managment: Goal: General experience of comfort will improve Outcome: Progressing

## 2022-10-01 NOTE — Progress Notes (Signed)
PROGRESS NOTE    DIAJA ON  ZOX:[id number] DOB: May 28, 1945 DOA: 09/29/2022 PCP: [name], MD    Brief Narrative:   Patricia Fitzpatrick is a 77 y.o. female with past medical history significant for Parkinson's disease, depression, essential hypertension and recent hospitalization with L1 lumbar vertebral compression fracture, pubic ramus fracture and closed displaced fracture greater trochanter left femur who presented to [name], ED from home on 8/14 with progressive weakness, recurrent falls and continued/increased low back pain.  Patient was recently discharged from SNF but felt that still needed aggressive rehab.  Since returning home has progressively become weaker with decreased mobilization.  Patient is not headache, no visual changes, no chest pain, no palpitations, no shortness of breath, no abdominal pain, no fever/chills/night sweats, no nausea/vomit/diarrhea, no cough/congestion, no focal weakness, no fatigue, no paresthesias.     In the ED, temperature 98.0 F, HR 64, RR 18, BP 140/74, SpO2 96% on room air.  WBC 6.2, hemoglobin 13.3, platelet count 243.  Sodium 139, potassium 4.5, CO2 27, BUN 19, creatinine 0.5, glucose 106.  AST 20, ALT 14, total bilirubin 0.5.  CT head/C-spine with no acute findings/fracture or subluxation.  CT T/L-spine with numerous thoracic/lumbar wedge fractures T5, T7, T12, L1, L3 with near plana deformities T7 and T11.  Neurosurgery was consulted and given her anatomy, osteoporosis she is not a surgical candidate as the hardware would likely fail and recommended bracing, therapy evaluation, pain management and IR consultation.  EDP consulted [facility] for admission for further evaluation and management of recurrent falls associated with increased back pain and weakness related to thoracic/lumbar compression fractures.  Assessment & Plan:   Thoracic/lumbar spine compression fracture Patient presenting to ED with continued/progressive low back pain in the setting of  recurrent falls. CT T/L-spine with numerous thoracic/lumbar wedge fractures T5, T7, T12, L1, L3 with near plana deformities T7 and T11.  MR L-spine with acute/subacute compression fracture L1 vertebral body with 40% height loss and 5 mm bony retropulsion with mild spinal stenosis, additional chronic compression fractures T9, T11, T12, L3 with 7 mm retropulsion about the T11 fracture with mild spinal stenosis, mild degenerative disc disease with mild/moderate multilevel facet hypertrophy.  Neurosurgery was consulted and given her anatomy, osteoporosis she is not a surgical candidate as the hardware would likely fail and recommended bracing, therapy evaluation, pain management and IR consultation for consideration of kyphoplasty.  Vitamin D 25-hydroxy level 62.28, within normal limits. -- Neurosurgery, interventional radiology following, appreciate assistance -- Pending kyphoplasty today, n.p.o., holding DVT prophylaxis -- PT/OT evaluation: Recommend SNF placement -- Pain control with Tylenol/oxycodone -- Robaxin 500 mg p.o. q8h PRN Muscle spasms -- Continue brace while ambulating -- Fall precautions   Parkinson's disease -- Carbidopa/levodopa 25-100 mg 1.5 tablets 3 times daily   Essential hypertension -- Amlodipine 5 mg p.o. daily -- Hydralazine 10 mg p.o. every 8 hours as needed SBP >165 or DBP >110   Depression/anxiety -- Prozac 40 mg p.o. twice daily -- Clonazepam 0.5 mg p.o. twice daily   Recurrent falls Weakness/debility/deconditioning/gait disturbance: -- PT/OT recommend SNF placement -- TOC following   DVT prophylaxis: SCDs Start: 09/29/22 1824    Code Status: Full Code Family Communication: No family present at bedside this morning  Disposition Plan:  Level of care: Med-Surg Status is: Observation The patient remains OBS appropriate and will d/c before 2 midnights.    Consultants:  Neurosurgery, Dr. [name] Interventional radiology  Procedures:  Kyphoplasty:  Pending  Antimicrobials:  None  Subjective: Patient seen and examined at bedside, lying in bed.  Reports that she slept poorly overnight, felt confused once again this morning but alert and oriented at time of my evaluation.  Has been approved for kyphoplasty, pending by IR today.  Seen by PT/OT with recommendation of SNF placement, TOC aware.  Currently reports pain controlled.  No other questions or concerns at this time.  Denies headache, no dizziness, no chest pain, no shortness of breath, no abdominal pain, no fever/chills/night sweats, no nausea/vomiting/diarrhea, no focal weakness, no fatigue, no paresthesias.  No other acute events overnight per nurse staff.  Objective: Vitals:   09/30/22 1936 09/30/22 2331 10/01/22 0310 10/01/22 0728  BP: 101/63 (!) 144/77 134/83 122/73  Pulse: 68 60 65 (!) 59  Resp: 16 16 16    Temp: 98.4 F (36.9 C) 98.7 F (37.1 C) 98.1 F (36.7 C) 98.2 F (36.8 C)  TempSrc: Axillary Axillary Axillary Oral  SpO2: 94% 98% 100% 96%  Weight:      Height:        Intake/Output Summary (Last 24 hours) at 10/01/2022 1027 Last data filed at 10/01/2022 0735 Gross per 24 hour  Intake 295 ml  Output 1400 ml  Net -1105 ml   Filed Weights   09/29/22 1123 09/29/22 1819  Weight: 46.3 kg 44.6 kg    Examination:  Physical Exam: GEN: NAD, alert and oriented x 3, chronically ill/thin in appearance, mild tremors upper extremities at rest HEENT: NCAT, PERRL, EOMI, sclera clear, MMM PULM: CTAB w/o wheezes/crackles, normal respiratory effort CV: RRR w/o M/G/R GI: abd soft, NTND, NABS, no R/G/M MSK: no peripheral edema, muscle strength globally intact 5/5 bilateral upper/lower extremities NEURO: CN II-XII intact, slight resting tremor, otherwise no focal deficits, sensation to light touch intact PSYCH: normal mood/flat affect Integumentary: dry/intact, no rashes or wounds    Data Reviewed: I have personally reviewed following labs and imaging  studies  CBC: Recent Labs  Lab 09/29/22 1157  WBC 6.2  NEUTROABS 5.0  HGB 13.3  HCT 40.6  MCV 95.3  PLT 263   Basic Metabolic Panel: Recent Labs  Lab 09/29/22 1157  NA 139  K 4.5  CL 102  CO2 27  GLUCOSE 106*  BUN 19  CREATININE 0.85  CALCIUM 9.0  MG 2.0   GFR: Estimated Creatinine Clearance: 39.6 mL/min (by C-G formula based on SCr of 0.85 mg/dL). Liver Function Tests: Recent Labs  Lab 09/29/22 1157  AST 20  ALT 14  ALKPHOS 67  BILITOT 0.5  PROT 6.3*  ALBUMIN 3.3*   No results for input(s): "LIPASE", "AMYLASE" in the last 168 hours. No results for input(s): "AMMONIA" in the last 168 hours. Coagulation Profile: Recent Labs  Lab 10/01/22 0908  INR 1.0   Cardiac Enzymes: No results for input(s): "CKTOTAL", "CKMB", "CKMBINDEX", "TROPONINI" in the last 168 hours. BNP (last 3 results) No results for input(s): "PROBNP" in the last 8760 hours. HbA1C: No results for input(s): "HGBA1C" in the last 72 hours. CBG: No results for input(s): "GLUCAP" in the last 168 hours. Lipid Profile: No results for input(s): "CHOL", "HDL", "LDLCALC", "TRIG", "CHOLHDL", "LDLDIRECT" in the last 72 hours. Thyroid Function Tests: No results for input(s): "TSH", "T4TOTAL", "FREET4", "T3FREE", "THYROIDAB" in the last 72 hours. Anemia Panel: No results for input(s): "VITAMINB12", "FOLATE", "FERRITIN", "TIBC", "IRON", "RETICCTPCT" in the last 72 hours. Sepsis Labs: No results for input(s): "PROCALCITON", "LATICACIDVEN" in the last 168 hours.  No results found for this or any previous visit (from the  past 240 hour(s)).       Radiology Studies: MR LUMBAR SPINE WO CONTRAST  Result Date: 10/01/2022 CLINICAL DATA:  Initial evaluation for compression fracture. EXAM: MRI LUMBAR SPINE WITHOUT CONTRAST TECHNIQUE: Multiplanar, multisequence MR imaging of the lumbar spine was performed. No intravenous contrast was administered. COMPARISON:  Comparison made with prior CT from 09/29/2022.  FINDINGS: Segmentation: Standard. Lowest well-formed disc space labeled the L5-S1 level. Alignment: Mild exaggeration of the normal lumbar lordosis. Focal kyphotic angulation at the level of T11 related to a compression fracture. No significant listhesis. Vertebrae: Acute to subacute compression fracture involving the L1 vertebral body is seen. Associated height loss measures up to 40% with 5 mm bony retropulsion. This is benign/mechanical in appearance. Mild chronic compression deformity of T9 with mild 25% height loss and trace bony retropulsion. Severe chronic compression deformity of T11 with vertebral plana formation and 7 mm bony retropulsion. Chronic compression deformity of T12 with up to 70% height loss and 3 mm bony retropulsion. Chronic compression fracture of L3 with 30% height loss and no more than trace 2 mm bony retropulsion. Otherwise, vertebral body height maintained. Underlying bone marrow signal intensity within normal limits. Few scattered benign hemangiomata noted. No worrisome osseous lesions. No other abnormal marrow edema. Conus medullaris and cauda equina: Conus extends to the L2 level. Distal aspect of the cord is draped over the retropulsed chronic T11 fracture. Conus otherwise within normal limits. Nerve roots of the cauda equina within normal limits. Paraspinal and other soft tissues: Paraspinous soft tissues demonstrate no acute finding. Few scattered simple cyst noted about the visualized kidneys, benign in appearance, no follow-up imaging recommended. Disc levels: T10-11: 7 mm bony retropulsion related to the chronic T11 fracture. Retropulsed bone indents the ventral thecal sac. Secondary mild cord flattening without cord signal changes. Mild facet spurring. Resultant mild spinal stenosis. Mild left foraminal narrowing. Right neural foramen remains patent. T11-12: 3 mm bony retropulsion related to the chronic T12 fracture. Mild facet hypertrophy. No significant spinal stenosis.  Foramina remain patent. T12-L1: 5 mm bony retropulsion related to the acute to subacute L1 fracture. Flattening of the ventral thecal sac with resultant mild spinal stenosis. Foramina remain patent. L1-2:  Negative interspace.  Mild facet hypertrophy.  No stenosis. L2-3: Trace bony retropulsion related to the chronic L3 fracture. Superimposed small left foraminal disc protrusion. Mild facet hypertrophy. No significant spinal stenosis. Foramina remain patent. L3-4: Disc desiccation. Small left foraminal disc protrusion is seen. Mild facet hypertrophy. No spinal stenosis. Foramina remain adequately patent. L4-5: Disc desiccation. Small left foraminal disc protrusion with annular fissure. Moderate facet hypertrophy. No spinal stenosis. Foramina remain adequately patent. L5-S1: Minimal disc bulge. Moderate right with mild left facet hypertrophy. No spinal stenosis. Foramina remain patent. IMPRESSION: 1. Acute to subacute compression fracture involving the L1 vertebral body with up to 40% height loss and 5 mm bony retropulsion. Resultant mild spinal stenosis at this level. 2. Additional chronic compression fractures of T9, T11, T12, and L3 as detailed above. 7 mm retropulsion about the T11 fracture with mild spinal stenosis. 3. Underlying mild for age degenerative disc disease with mild-to-moderate multilevel facet hypertrophy. No other significant stenosis. Electronically Signed   By: Rise Mu M.D.   On: 10/01/2022 05:47   DG HIP UNILAT WITH PELVIS 2-3 VIEWS LEFT  Result Date: 09/30/2022 CLINICAL DATA:  Left hip pain.  Previous fall. EXAM: DG HIP (WITH OR WITHOUT PELVIS) 2-3V LEFT COMPARISON:  06/02/2022 FINDINGS: Postoperative change from left hip arthroplasty. No signs  of acute periprosthetic fracture or dislocation. Again seen are chronic fracture deformities involving the left superior and inferior pubic rami. Remote parasymphyseal right pubic bone fracture is also again seen in appears unchanged.  IMPRESSION: 1. No acute findings. 2. Left hip arthroplasty. 3. Chronic fracture deformities involving the left superior and inferior pubic rami and right parasymphyseal pubic bone. Electronically Signed   By: Signa Kell M.D.   On: 09/30/2022 16:42   CT Lumbar Spine Wo Contrast  Result Date: 09/29/2022 CLINICAL DATA:  Trauma, fall, pain, history of compression fractures EXAM: CT THORACIC AND LUMBAR SPINE WITHOUT CONTRAST TECHNIQUE: Multidetector CT imaging of the thoracic and lumbar spine was performed without intravenous contrast administration. Multiplanar CT image reconstructions were also generated. RADIATION DOSE REDUCTION: This exam was performed according to the departmental dose-optimization program which includes automated exposure control, adjustment of the mA and/or kV according to patient size and/or use of iterative reconstruction technique. COMPARISON:  08/30/2022 FINDINGS: Alignment: Slightly exaggerated thoracic kyphosis secondary to multiple wedge deformities. Normal lumbar lordosis. Vertebral bodies: Osteopenia. Numerous thoracic and lumbar wedge fractures, including of T5, T7, T11, T12, L1, and L3, particularly notable for near vertebral plana deformities at T7 and T11. There is increased, moderate height loss of comminuted fracture seen at L1, which was probably subacute at the time of imaging dated 08/30/2022, with other fracture deformities unchanged. No new fracture of the thoracic or lumbar spine. Disc spaces: Minimal disc space height loss and endplate osteophytosis throughout the thoracic and lumbar spine. Paraspinous soft tissues: Small bilateral pleural effusions. Coronary artery disease. Aortic atherosclerosis. IMPRESSION: 1. Numerous thoracic and lumbar wedge fractures, including of T5, T7, T11, T12, L1, and L3, particularly notable for near vertebral plana deformities at T7 and T11. 2. There is increased, moderate height loss of comminuted fracture seen at L1, which was probably  subacute at the time of imaging dated 08/30/2022, with other fracture deformities unchanged. 3. No new fracture of the thoracic or lumbar spine. 4. Small bilateral pleural effusions. 5. Coronary artery disease. Aortic Atherosclerosis (ICD10-I70.0). Electronically Signed   By: Jearld Lesch M.D.   On: 09/29/2022 15:40   CT Thoracic Spine Wo Contrast  Result Date: 09/29/2022 CLINICAL DATA:  Trauma, fall, pain, history of compression fractures EXAM: CT THORACIC AND LUMBAR SPINE WITHOUT CONTRAST TECHNIQUE: Multidetector CT imaging of the thoracic and lumbar spine was performed without intravenous contrast administration. Multiplanar CT image reconstructions were also generated. RADIATION DOSE REDUCTION: This exam was performed according to the departmental dose-optimization program which includes automated exposure control, adjustment of the mA and/or kV according to patient size and/or use of iterative reconstruction technique. COMPARISON:  08/30/2022 FINDINGS: Alignment: Slightly exaggerated thoracic kyphosis secondary to multiple wedge deformities. Normal lumbar lordosis. Vertebral bodies: Osteopenia. Numerous thoracic and lumbar wedge fractures, including of T5, T7, T11, T12, L1, and L3, particularly notable for near vertebral plana deformities at T7 and T11. There is increased, moderate height loss of comminuted fracture seen at L1, which was probably subacute at the time of imaging dated 08/30/2022, with other fracture deformities unchanged. No new fracture of the thoracic or lumbar spine. Disc spaces: Minimal disc space height loss and endplate osteophytosis throughout the thoracic and lumbar spine. Paraspinous soft tissues: Small bilateral pleural effusions. Coronary artery disease. Aortic atherosclerosis. IMPRESSION: 1. Numerous thoracic and lumbar wedge fractures, including of T5, T7, T11, T12, L1, and L3, particularly notable for near vertebral plana deformities at T7 and T11. 2. There is increased,  moderate height loss of comminuted  fracture seen at L1, which was probably subacute at the time of imaging dated 08/30/2022, with other fracture deformities unchanged. 3. No new fracture of the thoracic or lumbar spine. 4. Small bilateral pleural effusions. 5. Coronary artery disease. Aortic Atherosclerosis (ICD10-I70.0). Electronically Signed   By: Jearld Lesch M.D.   On: 09/29/2022 15:40   CT Cervical Spine Wo Contrast  Result Date: 09/29/2022 CLINICAL DATA:  Fall, pain, back injury, spine fracture EXAM: CT HEAD WITHOUT CONTRAST CT CERVICAL SPINE WITHOUT CONTRAST TECHNIQUE: Multidetector CT imaging of the head and cervical spine was performed following the standard protocol without intravenous contrast. Multiplanar CT image reconstructions of the cervical spine were also generated. RADIATION DOSE REDUCTION: This exam was performed according to the departmental dose-optimization program which includes automated exposure control, adjustment of the mA and/or kV according to patient size and/or use of iterative reconstruction technique. COMPARISON:  08/31/2022 FINDINGS: CT HEAD FINDINGS Brain: No evidence of acute infarction, hemorrhage, hydrocephalus, extra-axial collection or mass lesion/mass effect. Unchanged focal hypodensity left frontal corona radiata (series 2, 20). Periventricular white matter hypodensity. Vascular: No hyperdense vessel or unexpected calcification. Skull: Normal. Negative for fracture or focal lesion. Sinuses/Orbits: No acute finding. Other: None. CT CERVICAL SPINE FINDINGS Alignment: Normal. Skull base and vertebrae: No acute fracture. No primary bone lesion or focal pathologic process. Soft tissues and spinal canal: No prevertebral fluid or swelling. No visible canal hematoma. Disc levels: Moderate disc space height loss and osteophytosis of the lower cervical levels. Upper chest: Negative. Other: None. IMPRESSION: 1. No acute intracranial pathology. Small-vessel white matter disease  and unchanged focal hypodensity left frontal corona radiata, keeping with chronic infarction. 2. No fracture or static subluxation of the cervical spine. 3. 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 Electronically Signed   By: Jearld Lesch M.D.   On: 09/29/2022 15:34   CT Head Wo Contrast  Result Date: 09/29/2022 CLINICAL DATA:  Fall, pain, back injury, spine fracture EXAM: CT HEAD WITHOUT CONTRAST CT CERVICAL SPINE WITHOUT CONTRAST TECHNIQUE: Multidetector CT imaging of the head and cervical spine was performed following the standard protocol without intravenous contrast. Multiplanar CT image reconstructions of the cervical spine were also generated. RADIATION DOSE REDUCTION: This exam was performed according to the departmental dose-optimization program which includes automated exposure control, adjustment of the mA and/or kV according to patient size and/or use of iterative reconstruction technique. COMPARISON:  08/31/2022 FINDINGS: CT HEAD FINDINGS Brain: No evidence of acute infarction, hemorrhage, hydrocephalus, extra-axial collection or mass lesion/mass effect. Unchanged focal hypodensity left frontal corona radiata (series 2, 20). Periventricular white matter hypodensity. Vascular: No hyperdense vessel or unexpected calcification. Skull: Normal. Negative for fracture or focal lesion. Sinuses/Orbits: No acute finding. Other: None. CT CERVICAL SPINE FINDINGS Alignment: Normal. Skull base and vertebrae: No acute fracture. No primary bone lesion or focal pathologic process. Soft tissues and spinal canal: No prevertebral fluid or swelling. No visible canal hematoma. Disc levels: Moderate disc space height loss and osteophytosis of the lower cervical levels. Upper chest: Negative. Other: None. IMPRESSION: 1. No acute intracranial pathology. Small-vessel white matter disease and unchanged focal hypodensity left frontal corona radiata, keeping with chronic infarction. 2. No fracture or  static subluxation of the cervical spine. 3. 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 Electronically Signed   By: Jearld Lesch M.D.   On: 09/29/2022 15:34        Scheduled Meds:  amLODipine  5 mg Oral Daily   carbidopa-levodopa  1.5 tablet Oral  TID   clonazepam  0.25 mg Oral q AM   And   clonazePAM  0.5 mg Oral QHS   FLUoxetine  40 mg Oral BID   Continuous Infusions:   LOS: 0 days    Time spent: 51 minutes spent on chart review, discussion with nursing staff, consultants, updating family and interview/physical exam; more than 50% of that time was spent in counseling and/or coordination of care.    Alvira Philips [place], DO [facility] Available via Epic secure chat 7am-7pm After these hours, please refer to coverage provider listed on [email or web address] 10/01/2022, 10:27 AM

## 2022-10-01 NOTE — [place] FL2 (Signed)
[place] MEDICAID FL2 LEVEL OF CARE FORM     IDENTIFICATION  Patient Name: Patricia Fitzpatrick Birthdate: [birth year] Sex: female Admission Date (Current Location): 09/29/2022  [facility] and [place] Number:  Producer, television/film/video and Address:  [facility], [address], [place], [place] [address]      Provider Number: [id number]  Attending Physician Name and Address:  [place], Alvira Philips, DO  Relative Name and Phone Number:       Current Level of Care: Hospital Recommended Level of Care: Skilled Nursing Facility Prior Approval Number:    Date Approved/Denied:   PASRR Number: [id number]  Discharge Plan: SNF    Current Diagnoses: Patient Active Problem List   Diagnosis Date Noted   Lumbar compression fracture, closed, initial encounter (HCC) 09/29/2022   Pubic ramus fracture, left, with routine healing, subsequent encounter 08/31/2022   Closed compression fracture of L1 lumbar vertebra, initial encounter (HCC) 08/31/2022   Dehydration 08/31/2022   Fall at home, subsequent encounter 08/30/2022   Delirium 08/25/2020   Closed displaced fracture of greater trochanter of left femur (HCC) 08/22/2020   Hypertension    Depression    Osteoporosis    Parkinson's disease 10/14/2015   Tremor 10/14/2015   Memory change 10/14/2015    Orientation RESPIRATION BLADDER Height & Weight     Self, Time, Situation, Place (Fluctuating orientation)  Normal Incontinent Weight: 98 lb 5.2 oz (44.6 kg) Height:  5\' 2"  (157.5 cm)  BEHAVIORAL SYMPTOMS/MOOD NEUROLOGICAL BOWEL NUTRITION STATUS      Incontinent, Continent (incontinent sometimes) Diet (see DC summary)  AMBULATORY STATUS COMMUNICATION OF NEEDS Skin   Extensive Assist Verbally Skin abrasions (abrasion right leg)                       Personal Care Assistance Level of Assistance  Bathing, Feeding, Dressing Bathing Assistance: Limited assistance Feeding assistance: Limited assistance Dressing Assistance:  Limited assistance     Functional Limitations Info  Hearing   Hearing Info: Impaired (hard of hearing)      SPECIAL CARE FACTORS FREQUENCY  PT (By licensed PT), OT (By licensed OT)     PT Frequency: 5x/wk OT Frequency: 5x/wk            Contractures Contractures Info: Not present    Additional Factors Info  Code Status, Allergies, Psychotropic Code Status Info: Full Allergies Info: NKA Psychotropic Info: Sinemet 1.5 tablets 3x/day; Prozac 40mg  2x/day; Klonopin .25mg  in the morning; Klonopin .5mg  daily at bed         Current Medications (10/01/2022):  This is the current hospital active medication list Current Facility-Administered Medications  Medication Dose Route Frequency Provider Last Rate Last Admin   acetaminophen (TYLENOL) tablet 650 mg  650 mg Oral Q6H PRN [place], Eric J, DO   650 mg at 09/30/22 1634   Or   acetaminophen (TYLENOL) suppository 650 mg  650 mg Rectal Q6H PRN [place], Eric J, DO       amLODipine (NORVASC) tablet 5 mg  5 mg Oral Daily [place], Alvira Philips, DO   5 mg at 10/01/22 7846   carbidopa-levodopa (SINEMET IR) 25-100 MG per tablet immediate release 1.5 tablet  1.5 tablet Oral TID [place], Eric J, DO   1.5 tablet at 10/01/22 0825   clonazePAM (KLONOPIN) disintegrating tablet 0.25 mg  0.25 mg Oral q AM Calton Dach I, RPH   0.25 mg at 10/01/22 9629   And   clonazePAM (KLONOPIN) tablet 0.5  mg  0.5 mg Oral QHS Calton Dach I, RPH   0.5 mg at 09/30/22 2323   FLUoxetine (PROZAC) capsule 40 mg  40 mg Oral BID [place], Eric J, DO   40 mg at 10/01/22 9147   hydrALAZINE (APRESOLINE) tablet 10 mg  10 mg Oral Q8H PRN [place], Alvira Philips, DO       methocarbamol (ROBAXIN) tablet 500 mg  500 mg Oral Q8H PRN [place], Alvira Philips, DO       ondansetron [facility]) tablet 4 mg  4 mg Oral Q6H PRN [place], Alvira Philips, DO       Or   ondansetron [facility]) injection 4 mg  4 mg Intravenous Q6H PRN [place], Eric J, DO       oxyCODONE (Oxy IR/ROXICODONE) immediate release tablet 5  mg  5 mg Oral Q4H PRN [place], Eric J, DO       polyethylene glycol (MIRALAX / GLYCOLAX) packet 17 g  17 g Oral Daily PRN [place], Eric J, DO         Discharge Medications: Please see discharge summary for a list of discharge medications.  Relevant Imaging Results:  Relevant Lab Results:   Additional Information SS#: [id number]  Baldemar Lenis, LCSW

## 2022-10-01 NOTE — Progress Notes (Signed)
Patient has been NPO since 2100.

## 2022-10-01 NOTE — Consult Note (Signed)
Chief Complaint: Patient was seen in consultation today for painful L1 fracture; Lumbar 1 kyphoplasty Chief Complaint  Patient presents with   Tremors    Increasing tremors at home. Hx of  parkinsons. Reports on [place]. Reports on way to neurology due to increased tremors and hallucinations. Recent fall 26 days ago with a L fx. Was sent to rehab.    Fall   at the request of Dr Bea Laura [place]  Supervising Physician: Julieanne Cotton  Patient Status: [facility] - Out-pt  History of Present Illness: Patricia Fitzpatrick is a 77 y.o. female   FULL Code status per pt Hx Parkinson's disease; HTN Falls at home New severe back pain- weakness Decreased mobilization  MRI 8/15:  IMPRESSION: 1. Acute to subacute compression fracture involving the L1 vertebral body with up to 40% height loss and 5 mm bony retropulsion. Resultant mild spinal stenosis at this level. 2. Additional chronic compression fractures of T9, T11, T12, and L3 as detailed above. 7 mm retropulsion about the T11 fracture with mild spinal stenosis. 3. Underlying mild for age degenerative disc disease with mild-to-moderate multilevel facet hypertrophy. No other significant stenosis.  Request made for Lumbar 1 Vertebroplasty/kyphoplasty Dr Corliss Skains has reviewed imaging and approves procedure Scheduled in [facility] 8/19  Past Medical History:  Diagnosis Date   Anxiety    Cataracts, bilateral    Depression    High cholesterol    Hypertension    OCD (obsessive compulsive disorder)    Parkinsonism 10/14/2015   Tremor     Past Surgical History:  Procedure Laterality Date   BREAST BIOPSY  1981   CATARACT EXTRACTION Right 2017   EYE SURGERY  1952   HIP ARTHROPLASTY Left 08/23/2020   Procedure: ARTHROPLASTY BIPOLAR HIP (HEMIARTHROPLASTY);  Surgeon: Bjorn Pippin, MD;  Location: [facility] ORS;  Service: Orthopedics;  Laterality: Left;    Allergies: Patient has no known allergies.  Medications: Prior to Admission medications    Medication Sig Start Date End Date Taking? Authorizing Provider  acetaminophen (TYLENOL) 500 MG tablet Take 2 tablets (1,000 mg total) by mouth 3 (three) times daily. 09/03/22  Yes Berton Mount I, MD  amLODipine (NORVASC) 5 MG tablet Take 1 tablet (5 mg total) by mouth daily. 09/03/22  Yes Berton Mount I, MD  carbidopa-levodopa (SINEMET IR) 25-100 MG tablet Take 1.5 tablets by mouth 3 (three) times daily. 04/26/22  Yes Penumalli, Glenford Bayley, MD  clonazePAM (KLONOPIN) 0.5 MG tablet Take 0.5 mg by mouth 2 (two) times daily. 1/2 tab in the morning, 1 tab at bedtime daily 09/27/22  Yes [provider]  denosumab (PROLIA) 60 MG/ML SOSY injection Inject 60 mg into the skin every 6 (six) months.   Yes [provider]  docusate sodium (COLACE) 100 MG capsule Take 1 capsule (100 mg total) by mouth 2 (two) times daily. 09/03/22  Yes Berton Mount I, MD  FLUoxetine (PROZAC) 40 MG capsule Take 40 mg by mouth 2 (two) times daily.   Yes [provider]  methocarbamol (ROBAXIN) 500 MG tablet Take 1 tablet (500 mg total) by mouth every 8 (eight) hours as needed for muscle spasms. 09/03/22  Yes Berton Mount I, MD  oxyCODONE (OXY IR/ROXICODONE) 5 MG immediate release tablet Take 1 tablet (5 mg total) by mouth every 4 (four) hours as needed for severe pain. 09/03/22  Yes Berton Mount I, MD  polyethylene glycol (MIRALAX / GLYCOLAX) 17 g packet Take 17 g by mouth daily. 09/03/22  Yes Barnetta Chapel, MD  B Complex Vitamins (B COMPLEX PO) Take 1 tablet by mouth daily. Patient not taking: Reported on 09/29/2022    [provider]  Cholecalciferol (VITAMIN D3 PO) Take 50 mcg by mouth daily. Patient not taking: Reported on 09/29/2022    [provider]  enoxaparin (LOVENOX) 30 MG/0.3ML injection Inject 0.3 mLs (30 mg total) into the skin daily. Patient not taking: Reported on 09/29/2022 09/04/22   Berton Mount I, MD  hydrALAZINE (APRESOLINE) 25 MG tablet Take  1 tablet (25 mg total) by mouth every 6 (six) hours as needed (For SBP greater than 170 mmHg). Patient not taking: Reported on 09/29/2022 09/03/22   Berton Mount I, MD  QUEtiapine (SEROQUEL) 25 MG tablet Take 1.5 tablets (37.5 mg total) by mouth at bedtime. Patient not taking: Reported on 09/29/2022 09/03/22   Berton Mount I, MD  thiamine (VITAMIN B1) 100 MG tablet Take 1 tablet (100 mg total) by mouth daily. Patient not taking: Reported on 09/29/2022 09/03/22   Berton Mount I, MD  vitamin C (ASCORBIC ACID) 500 MG tablet Take 500 mg by mouth daily. Patient not taking: Reported on 09/29/2022    [provider]     Family History  Problem Relation Age of Onset   Pulmonary disease Mother    Alzheimer's disease Mother    Heart disease Father    Alzheimer's disease Father    Parkinson's disease Neg Hx     Social History   Socioeconomic History   Marital status: Widowed    Spouse name: Not on file   Number of children: 0   Years of education: Bachelors   Highest education level: Not on file  Occupational History   Occupation: Retired  Tobacco Use   Smoking status: Never    Passive exposure: Never   Smokeless tobacco: Never  Vaping Use   Vaping status: Never Used  Substance and Sexual Activity   Alcohol use: Not Currently    Comment: One glass per year. update 04/21/21 pt can't remember her last drink   Drug use: No   Sexual activity: Not on file  Other Topics Concern   Not on file  Social History Narrative   Lives at home, with adult stepdaughter, and brother.   Right-handed.   No caffeine use.   Social Determinants of Health   Financial Resource Strain: Not on file  Food Insecurity: No Food Insecurity (09/30/2022)   Hunger Vital Sign    Worried About Running Out of Food in the Last Year: Never true    Ran Out of Food in the Last Year: Never true  Transportation Needs: No Transportation Needs (09/30/2022)   PRAPARE - Scientist, research (physical sciences) (Medical): No    Lack of Transportation (Non-Medical): No  Physical Activity: Not on file  Stress: Not on file  Social Connections: Not on file    Review of Systems: A 12 point ROS discussed and pertinent positives are indicated in the HPI above.  All other systems are negative.  Review of Systems  Constitutional:  Positive for activity change, appetite change and fatigue. Negative for fever.  Respiratory:  Negative for cough and shortness of breath.   Cardiovascular:  Negative for chest pain.  Gastrointestinal:  Negative for abdominal pain.  Musculoskeletal:  Positive for back pain and gait problem.  Neurological:  Positive for weakness.  Psychiatric/Behavioral:  Negative for behavioral problems and confusion.     Vital Signs: BP 122/73   Pulse (!) 59   Temp  98.2 F (36.8 C) (Oral)   Resp 16   Ht 5\' 2"  (1.575 m)   Wt 98 lb 5.2 oz (44.6 kg)   SpO2 96%   BMI 17.98 kg/m   Advance Care Plan: The advanced care plan/surrogate decision maker was discussed at the time of visit and documented in the medical record.    Physical Exam Vitals reviewed.  Cardiovascular:     Rate and Rhythm: Normal rate and regular rhythm.     Heart sounds: Normal heart sounds.  Pulmonary:     Effort: Pulmonary effort is normal.     Breath sounds: Normal breath sounds.  Abdominal:     Palpations: Abdomen is soft.     Tenderness: There is no abdominal tenderness.  Musculoskeletal:        General: Normal range of motion.  Skin:    General: Skin is warm.  Neurological:     Mental Status: She is alert and oriented to person, place, and time.  Psychiatric:        Behavior: Behavior normal.     Imaging: MR LUMBAR SPINE WO CONTRAST  Result Date: 10/01/2022 CLINICAL DATA:  Initial evaluation for compression fracture. EXAM: MRI LUMBAR SPINE WITHOUT CONTRAST TECHNIQUE: Multiplanar, multisequence MR imaging of the lumbar spine was performed. No intravenous contrast was administered.  COMPARISON:  Comparison made with prior CT from 09/29/2022. FINDINGS: Segmentation: Standard. Lowest well-formed disc space labeled the L5-S1 level. Alignment: Mild exaggeration of the normal lumbar lordosis. Focal kyphotic angulation at the level of T11 related to a compression fracture. No significant listhesis. Vertebrae: Acute to subacute compression fracture involving the L1 vertebral body is seen. Associated height loss measures up to 40% with 5 mm bony retropulsion. This is benign/mechanical in appearance. Mild chronic compression deformity of T9 with mild 25% height loss and trace bony retropulsion. Severe chronic compression deformity of T11 with vertebral plana formation and 7 mm bony retropulsion. Chronic compression deformity of T12 with up to 70% height loss and 3 mm bony retropulsion. Chronic compression fracture of L3 with 30% height loss and no more than trace 2 mm bony retropulsion. Otherwise, vertebral body height maintained. Underlying bone marrow signal intensity within normal limits. Few scattered benign hemangiomata noted. No worrisome osseous lesions. No other abnormal marrow edema. Conus medullaris and cauda equina: Conus extends to the L2 level. Distal aspect of the cord is draped over the retropulsed chronic T11 fracture. Conus otherwise within normal limits. Nerve roots of the cauda equina within normal limits. Paraspinal and other soft tissues: Paraspinous soft tissues demonstrate no acute finding. Few scattered simple cyst noted about the visualized kidneys, benign in appearance, no follow-up imaging recommended. Disc levels: T10-11: 7 mm bony retropulsion related to the chronic T11 fracture. Retropulsed bone indents the ventral thecal sac. Secondary mild cord flattening without cord signal changes. Mild facet spurring. Resultant mild spinal stenosis. Mild left foraminal narrowing. Right neural foramen remains patent. T11-12: 3 mm bony retropulsion related to the chronic T12 fracture.  Mild facet hypertrophy. No significant spinal stenosis. Foramina remain patent. T12-L1: 5 mm bony retropulsion related to the acute to subacute L1 fracture. Flattening of the ventral thecal sac with resultant mild spinal stenosis. Foramina remain patent. L1-2:  Negative interspace.  Mild facet hypertrophy.  No stenosis. L2-3: Trace bony retropulsion related to the chronic L3 fracture. Superimposed small left foraminal disc protrusion. Mild facet hypertrophy. No significant spinal stenosis. Foramina remain patent. L3-4: Disc desiccation. Small left foraminal disc protrusion is seen. Mild facet hypertrophy.  No spinal stenosis. Foramina remain adequately patent. L4-5: Disc desiccation. Small left foraminal disc protrusion with annular fissure. Moderate facet hypertrophy. No spinal stenosis. Foramina remain adequately patent. L5-S1: Minimal disc bulge. Moderate right with mild left facet hypertrophy. No spinal stenosis. Foramina remain patent. IMPRESSION: 1. Acute to subacute compression fracture involving the L1 vertebral body with up to 40% height loss and 5 mm bony retropulsion. Resultant mild spinal stenosis at this level. 2. Additional chronic compression fractures of T9, T11, T12, and L3 as detailed above. 7 mm retropulsion about the T11 fracture with mild spinal stenosis. 3. Underlying mild for age degenerative disc disease with mild-to-moderate multilevel facet hypertrophy. No other significant stenosis. Electronically Signed   By: Rise Mu M.D.   On: 10/01/2022 05:47   DG HIP UNILAT WITH PELVIS 2-3 VIEWS LEFT  Result Date: 09/30/2022 CLINICAL DATA:  Left hip pain.  Previous fall. EXAM: DG HIP (WITH OR WITHOUT PELVIS) 2-3V LEFT COMPARISON:  06/02/2022 FINDINGS: Postoperative change from left hip arthroplasty. No signs of acute periprosthetic fracture or dislocation. Again seen are chronic fracture deformities involving the left superior and inferior pubic rami. Remote parasymphyseal right pubic  bone fracture is also again seen in appears unchanged. IMPRESSION: 1. No acute findings. 2. Left hip arthroplasty. 3. Chronic fracture deformities involving the left superior and inferior pubic rami and right parasymphyseal pubic bone. Electronically Signed   By: Signa Kell M.D.   On: 09/30/2022 16:42   CT Lumbar Spine Wo Contrast  Result Date: 09/29/2022 CLINICAL DATA:  Trauma, fall, pain, history of compression fractures EXAM: CT THORACIC AND LUMBAR SPINE WITHOUT CONTRAST TECHNIQUE: Multidetector CT imaging of the thoracic and lumbar spine was performed without intravenous contrast administration. Multiplanar CT image reconstructions were also generated. RADIATION DOSE REDUCTION: This exam was performed according to the departmental dose-optimization program which includes automated exposure control, adjustment of the mA and/or kV according to patient size and/or use of iterative reconstruction technique. COMPARISON:  08/30/2022 FINDINGS: Alignment: Slightly exaggerated thoracic kyphosis secondary to multiple wedge deformities. Normal lumbar lordosis. Vertebral bodies: Osteopenia. Numerous thoracic and lumbar wedge fractures, including of T5, T7, T11, T12, L1, and L3, particularly notable for near vertebral plana deformities at T7 and T11. There is increased, moderate height loss of comminuted fracture seen at L1, which was probably subacute at the time of imaging dated 08/30/2022, with other fracture deformities unchanged. No new fracture of the thoracic or lumbar spine. Disc spaces: Minimal disc space height loss and endplate osteophytosis throughout the thoracic and lumbar spine. Paraspinous soft tissues: Small bilateral pleural effusions. Coronary artery disease. Aortic atherosclerosis. IMPRESSION: 1. Numerous thoracic and lumbar wedge fractures, including of T5, T7, T11, T12, L1, and L3, particularly notable for near vertebral plana deformities at T7 and T11. 2. There is increased, moderate height  loss of comminuted fracture seen at L1, which was probably subacute at the time of imaging dated 08/30/2022, with other fracture deformities unchanged. 3. No new fracture of the thoracic or lumbar spine. 4. Small bilateral pleural effusions. 5. Coronary artery disease. Aortic Atherosclerosis (ICD10-I70.0). Electronically Signed   By: Jearld Lesch M.D.   On: 09/29/2022 15:40   CT Thoracic Spine Wo Contrast  Result Date: 09/29/2022 CLINICAL DATA:  Trauma, fall, pain, history of compression fractures EXAM: CT THORACIC AND LUMBAR SPINE WITHOUT CONTRAST TECHNIQUE: Multidetector CT imaging of the thoracic and lumbar spine was performed without intravenous contrast administration. Multiplanar CT image reconstructions were also generated. RADIATION DOSE REDUCTION: This exam was performed according  to the departmental dose-optimization program which includes automated exposure control, adjustment of the mA and/or kV according to patient size and/or use of iterative reconstruction technique. COMPARISON:  08/30/2022 FINDINGS: Alignment: Slightly exaggerated thoracic kyphosis secondary to multiple wedge deformities. Normal lumbar lordosis. Vertebral bodies: Osteopenia. Numerous thoracic and lumbar wedge fractures, including of T5, T7, T11, T12, L1, and L3, particularly notable for near vertebral plana deformities at T7 and T11. There is increased, moderate height loss of comminuted fracture seen at L1, which was probably subacute at the time of imaging dated 08/30/2022, with other fracture deformities unchanged. No new fracture of the thoracic or lumbar spine. Disc spaces: Minimal disc space height loss and endplate osteophytosis throughout the thoracic and lumbar spine. Paraspinous soft tissues: Small bilateral pleural effusions. Coronary artery disease. Aortic atherosclerosis. IMPRESSION: 1. Numerous thoracic and lumbar wedge fractures, including of T5, T7, T11, T12, L1, and L3, particularly notable for near vertebral  plana deformities at T7 and T11. 2. There is increased, moderate height loss of comminuted fracture seen at L1, which was probably subacute at the time of imaging dated 08/30/2022, with other fracture deformities unchanged. 3. No new fracture of the thoracic or lumbar spine. 4. Small bilateral pleural effusions. 5. Coronary artery disease. Aortic Atherosclerosis (ICD10-I70.0). Electronically Signed   By: Jearld Lesch M.D.   On: 09/29/2022 15:40   CT Cervical Spine Wo Contrast  Result Date: 09/29/2022 CLINICAL DATA:  Fall, pain, back injury, spine fracture EXAM: CT HEAD WITHOUT CONTRAST CT CERVICAL SPINE WITHOUT CONTRAST TECHNIQUE: Multidetector CT imaging of the head and cervical spine was performed following the standard protocol without intravenous contrast. Multiplanar CT image reconstructions of the cervical spine were also generated. RADIATION DOSE REDUCTION: This exam was performed according to the departmental dose-optimization program which includes automated exposure control, adjustment of the mA and/or kV according to patient size and/or use of iterative reconstruction technique. COMPARISON:  08/31/2022 FINDINGS: CT HEAD FINDINGS Brain: No evidence of acute infarction, hemorrhage, hydrocephalus, extra-axial collection or mass lesion/mass effect. Unchanged focal hypodensity left frontal corona radiata (series 2, 20). Periventricular white matter hypodensity. Vascular: No hyperdense vessel or unexpected calcification. Skull: Normal. Negative for fracture or focal lesion. Sinuses/Orbits: No acute finding. Other: None. CT CERVICAL SPINE FINDINGS Alignment: Normal. Skull base and vertebrae: No acute fracture. No primary bone lesion or focal pathologic process. Soft tissues and spinal canal: No prevertebral fluid or swelling. No visible canal hematoma. Disc levels: Moderate disc space height loss and osteophytosis of the lower cervical levels. Upper chest: Negative. Other: None. IMPRESSION: 1. No acute  intracranial pathology. Small-vessel white matter disease and unchanged focal hypodensity left frontal corona radiata, keeping with chronic infarction. 2. No fracture or static subluxation of the cervical spine. 3. 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 Electronically Signed   By: Jearld Lesch M.D.   On: 09/29/2022 15:34   CT Head Wo Contrast  Result Date: 09/29/2022 CLINICAL DATA:  Fall, pain, back injury, spine fracture EXAM: CT HEAD WITHOUT CONTRAST CT CERVICAL SPINE WITHOUT CONTRAST TECHNIQUE: Multidetector CT imaging of the head and cervical spine was performed following the standard protocol without intravenous contrast. Multiplanar CT image reconstructions of the cervical spine were also generated. RADIATION DOSE REDUCTION: This exam was performed according to the departmental dose-optimization program which includes automated exposure control, adjustment of the mA and/or kV according to patient size and/or use of iterative reconstruction technique. COMPARISON:  08/31/2022 FINDINGS: CT HEAD FINDINGS Brain: No evidence of acute infarction, hemorrhage, hydrocephalus,  extra-axial collection or mass lesion/mass effect. Unchanged focal hypodensity left frontal corona radiata (series 2, 20). Periventricular white matter hypodensity. Vascular: No hyperdense vessel or unexpected calcification. Skull: Normal. Negative for fracture or focal lesion. Sinuses/Orbits: No acute finding. Other: None. CT CERVICAL SPINE FINDINGS Alignment: Normal. Skull base and vertebrae: No acute fracture. No primary bone lesion or focal pathologic process. Soft tissues and spinal canal: No prevertebral fluid or swelling. No visible canal hematoma. Disc levels: Moderate disc space height loss and osteophytosis of the lower cervical levels. Upper chest: Negative. Other: None. IMPRESSION: 1. No acute intracranial pathology. Small-vessel white matter disease and unchanged focal hypodensity left frontal corona  radiata, keeping with chronic infarction. 2. No fracture or static subluxation of the cervical spine. 3. 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 Electronically Signed   By: Jearld Lesch M.D.   On: 09/29/2022 15:34   CT ABDOMEN PELVIS W WO CONTRAST  Result Date: 09/02/2022 CLINICAL DATA:  Hydronephrosis Renal mass/cyst, indeterminate EXAM: CT ABDOMEN AND PELVIS WITHOUT AND WITH CONTRAST TECHNIQUE: Multidetector CT imaging of the abdomen and pelvis was performed following the standard protocol before and following the bolus administration of intravenous contrast. RADIATION DOSE REDUCTION: This exam was performed according to the departmental dose-optimization program which includes automated exposure control, adjustment of the mA and/or kV according to patient size and/or use of iterative reconstruction technique. CONTRAST:  OMNIPAQUE IOHEXOL 350 MG/ML SOLN COMPARISON:  Ultrasound renal from yesterday. FINDINGS: Lower chest: There are patchy atelectatic changes in the visualized lung bases. No overt consolidation. No pleural effusion. The heart is normal in size. No pericardial effusion. Hepatobiliary: The liver is normal in size. Non-cirrhotic configuration. There is a 11 x 21 mm subcapsular, homogeneously enhancing lesion in the right hepatic lobe, segment 5. There is a vessel traversing through the lesion without displacement. This is favored to represent a flash filling hemangioma. No intrahepatic or extrahepatic bile duct dilation. Probable noncalcified gallstone. Normal gallbladder wall thickness. No pericholecystic inflammatory changes. Pancreas: Unremarkable. No pancreatic ductal dilatation or surrounding inflammatory changes. Spleen: Within normal limits. No focal lesion. Adrenals/Urinary Tract: Adrenal glands are unremarkable. No suspicious renal mass. There is no focal lesion in the right kidney, which corresponds to the observation described on the prior ultrasound exam.  Findings on prior ultrasound therefore favored normal renal parenchyma. There are multiple bilateral sinus cysts (left-greater-than-right). There are at least 2, subcentimeter, hypoattenuating foci in the right kidney, favored to represent cysts. No hydronephrosis. Bilateral extrarenal pelves noted. No renal or ureteric calculi. Unremarkable urinary bladder. Stomach/Bowel: No disproportionate dilation of the small or large bowel loops. No evidence of abnormal bowel wall thickening or inflammatory changes. The appendix was not visualized; however there is no acute inflammatory process in the right lower quadrant. Vascular/Lymphatic: No ascites or pneumoperitoneum. No abdominal or pelvic lymphadenopathy, by size criteria. No aneurysmal dilation of the major abdominal arteries. There are mild peripheral atherosclerotic vascular calcifications of the aorta and its major branches. Reproductive: The uterus is unremarkable. No large adnexal mass. Other: There is a tiny fat containing umbilical hernia. The soft tissues and abdominal wall are otherwise unremarkable. Musculoskeletal: No suspicious osseous lesions. Redemonstration of severe compression deformity of T11 vertebrae and mild compression deformity of T12 and L3 vertebrae. There is subacute/healing burst fracture of L1 vertebra. There are moderate multilevel degenerative changes in the visualized spine. IMPRESSION: 1. No suspicious renal mass. No focal lesion in the right kidney, which corresponds to the observations described on the prior  ultrasound exam. Findings on prior ultrasound therefore favored normal renal parenchyma. 2. Multiple other nonacute observations, as described above. Aortic Atherosclerosis (ICD10-I70.0). Electronically Signed   By: Jules Schick M.D.   On: 09/02/2022 15:18   US RENAL  Result Date: 09/01/2022 CLINICAL DATA:  16109 Urinary retention [id number] EXAM: RENAL / URINARY TRACT ULTRASOUND COMPLETE COMPARISON:  None Available. FINDINGS:  The technologist noted suboptimal exam due to patient's body habitus. Examination is also limited due to technique. Cine loop images were not provided. Right Kidney: Markedly limited evaluation. Renal measurements: 4.8 x 5.6 x 9.5 cm = volume: 134.5 mL. Echogenicity within normal limits. The technologist measured a vague isoechoic 2.4 x 2.7 cm area in the right kidney interpolar region. This is indeterminate in etiology. On the basis of provided images, it is unclear whether this is a focal lesion or portion of renal parenchyma. Further evaluation with nonemergent contrast-enhanced CT scan of the abdomen is recommended. There is probable 1.2 x 2.0 cm sinus cyst in the interpolar region as well. No hydronephrosis. Left Kidney: Markedly limited examination. Renal measurements: 3.9 x 4.5 x 9.0 cm. = volume: 82.6 mL. Echogenicity within normal limits. Mild-to-moderate hydronephrosis. No contour deforming mass seen on the provided images. Bladder: Appears normal for degree of bladder distention. Other: None. IMPRESSION: 1. Markedly limited exam. 2. Mild-to-moderate left hydronephrosis. 3. Apparent isoechoic area in the right kidney, with imaging characteristics, probable diagnosis and follow-up recommendations, as described above. Electronically Signed   By: Jules Schick M.D.   On: 09/01/2022 18:31    Labs:  CBC: Recent Labs    09/01/22 0357 09/02/22 0050 09/03/22 0317 09/29/22 1157  WBC 6.9 5.4 5.6 6.2  HGB 12.6 12.4 12.0 13.3  HCT 38.8 36.0 36.9 40.6  PLT 214 199 235 263    COAGS: Recent Labs    10/01/22 0908  INR 1.0    BMP: Recent Labs    09/01/22 0357 09/02/22 0050 09/03/22 0317 09/29/22 1157  NA 137 138 137 139  K 3.8 3.9 3.9 4.5  CL 104 104 108 102  CO2 27 20* 25 27  GLUCOSE 106* 88 94 106*  BUN 16 16 18 19   CALCIUM 8.2* 8.2* 8.2* 9.0  CREATININE 0.69 0.64 0.87 0.85  GFRNONAA >60 >60 >60 >60    LIVER FUNCTION TESTS: Recent Labs    09/29/22 1157  BILITOT 0.5  AST 20   ALT 14  ALKPHOS 67  PROT 6.3*  ALBUMIN 3.3*    TUMOR MARKERS: No results for input(s): "AFPTM", "CEA", "CA199", "CHROMGRNA" in the last 8760 hours.  Assessment and Plan:  Scheduled for Lumbar 1 Vertebroplasty/Kyphoplasty in IR 8/19 Risks and benefits of Lumbar 1 Kyphoplasty were discussed with the patient including, but not limited to education regarding the natural healing process of compression fractures without intervention, bleeding, infection, cement migration which may cause spinal cord damage, paralysis, pulmonary embolism or even death.  This interventional procedure involves the use of X-rays and because of the nature of the planned procedure, it is possible that we will have prolonged use of X-ray fluoroscopy.  Potential radiation risks to you include (but are not limited to) the following: - A slightly elevated risk for cancer  several years later in life. This risk is typically less than 0.5% percent. This risk is low in comparison to the normal incidence of human cancer, which is 33% for women and 50% for men according to the [employer]. - Radiation induced injury can include skin redness, resembling  a rash, tissue breakdown / ulcers and hair loss (which can be temporary or permanent).   The likelihood of either of these occurring depends on the difficulty of the procedure and whether you are sensitive to radiation due to previous procedures, disease, or genetic conditions.   IF your procedure requires a prolonged use of radiation, you will be notified and given written instructions for further action.  It is your responsibility to monitor the irradiated area for the 2 weeks following the procedure and to notify your physician if you are concerned that you have suffered a radiation induced injury.    All of the patient's questions were answered, patient is agreeable to proceed.  Consent signed and in chart.  Thank you for this interesting consult.  I greatly  enjoyed meeting Patricia Fitzpatrick and look forward to participating in their care.  A copy of this report was sent to the requesting provider on this date.  Electronically Signed: Robet Leu, PA-C 10/01/2022, 11:31 AM   I spent a total of 40 Minutes    in face to face in clinical consultation, greater than 50% of which was counseling/coordinating care for Lumbar 1 VP/KP

## 2022-10-01 NOTE — Progress Notes (Signed)
Physical Therapy Treatment Patient Details Name: Patricia Fitzpatrick MRN: [id number] DOB: 1945-11-24 Today's Date: 10/01/2022   History of Present Illness 77 y.o. female presents to [facility] hospital on 09/29/2022 with progressive difficulty walking. 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 Pt fell recently (08/2022) and was diagnosed with pubic ramus fx, L1 compression fx and L femur greater trochanter fx (treated conservatively with PWB'ing went to SNF, D/C'd from [facility] 09/27/2022. PMH includes anxiety, depression, HLD, HTN, parkinsonism.    PT Comments  Tolerated treatment well. Eager to get OOB. Brace applied and readjusted - educated on use, and lowering under abdomen for increased lift and support, which also keeps chest plate on sternum for greater comfort. Mod assist for transitions to EOB, sit to stand with RW, and step pivot transfer towards Rt while maintaining WBAT on Lt, and avoiding active hip abduction. Complains of minimal pain today with mobility. Reviewed LE exercises, and precautions. Patient will continue to benefit from skilled physical therapy services to further improve independence with functional mobility.     If plan is discharge home, recommend the following: A lot of help with walking and/or transfers;A lot of help with bathing/dressing/bathroom;Assistance with cooking/housework;Help with stairs or ramp for entrance;Assist for transportation   Can travel by private vehicle     No (Will update post-op)  Equipment Recommendations  Wheelchair (measurements PT);Wheelchair cushion (measurements PT);BSC/3in1    Recommendations for Other Services       Precautions / Restrictions Precautions Precautions: Fall;Back Precaution Comments: no active L hip abduction Required Braces or Orthoses: Spinal Brace Spinal Brace:  Thoracolumbosacral orthotic;Applied in sitting position Restrictions Weight Bearing Restrictions: Yes LLE Weight Bearing: Weight bearing as tolerated Other Position/Activity Restrictions: no active L hip abduction;     Mobility  Bed Mobility Overal bed mobility: Needs Assistance Bed Mobility: Rolling, Sidelying to Sit Rolling: Min assist Sidelying to sit: Mod assist, Used rails       General bed mobility comments: min assist to roll, reviewed log roll technique today, provided support for LEs out of bed, used rail to pull. Mod assist for trunk stupport to rise to EOB. Leaning Rt.    Transfers Overall transfer level: Needs assistance Equipment used: Rolling walker (2 wheels) Transfers: Sit to/from Stand, Bed to chair/wheelchair/BSC Sit to Stand: Mod assist   Step pivot transfers: Mod assist       General transfer comment: Mod assist for boost to stand, VC for technique, weight shift, foot and hand placement. Leaning heavily towards posterior without evidence of LE buckling. Mod assist to facilitate steps towards RT (avoiding Lt hip active abduction.) Still with heavy posterior lean but occasionally correcting with multimodal cues to lean forward onto RW. Assisted with balance, and RW control. VC for tempo which greatly improved step sequence.    Ambulation/Gait               General Gait Details: Deferred, suggest +2 assist to ambulate, not immediately available this afternoon   Stairs             Wheelchair Mobility     Tilt Bed    Modified Rankin (Stroke Patients Only)       Balance Overall balance assessment: Needs assistance Sitting-balance support: Feet supported, Single extremity supported Sitting balance-Leahy Scale: Poor Sitting balance - Comments: Progressed to CGA Postural control: Posterior lean, Right  lateral lean Standing balance support: Bilateral upper extremity supported, Reliant on assistive device for balance Standing balance-Leahy  Scale: Poor Standing balance comment: posterior lean                            Cognition Arousal: Alert Behavior During Therapy: WFL for tasks assessed/performed, Flat affect Overall Cognitive Status: No family/caregiver present to determine baseline cognitive functioning Area of Impairment: Attention, Memory, Following commands, Safety/judgement, Awareness, Problem solving                 Orientation Level: Time Current Attention Level: Sustained Memory: Decreased short-term memory, Decreased recall of precautions Following Commands: Follows multi-step commands inconsistently, Follows one step commands consistently Safety/Judgement: Decreased awareness of safety, Decreased awareness of deficits Awareness: Emergent Problem Solving: Slow processing, Difficulty sequencing, Requires verbal cues, Decreased initiation, Requires tactile cues General Comments: Freezing, likely secondary to PD        Exercises General Exercises - Lower Extremity Ankle Circles/Pumps: AROM, Both, 15 reps, Seated Quad Sets: Strengthening, Both, 10 reps, Seated Gluteal Sets: Strengthening, Both, 10 reps, Seated    General Comments General comments (skin integrity, edema, etc.): Reviewed precautions.      Pertinent Vitals/Pain Pain Assessment Pain Assessment: Faces Faces Pain Scale: Hurts a little bit Pain Location: back, (1 Lt hip) Pain Descriptors / Indicators: Aching Pain Intervention(s): Monitored during session, Repositioned    Home Living                          Prior Function            PT Goals (current goals can now be found in the care plan section) Acute Rehab PT Goals Patient Stated Goal: Get stronger, be independent PT Goal Formulation: With patient Time For Goal Achievement: 10/14/22 Potential to Achieve Goals: Fair Progress towards PT goals: Progressing toward goals    Frequency    Min 1X/week      PT Plan Current plan remains appropriate     Co-evaluation              AM-PAC PT "6 Clicks" Mobility   Outcome Measure  Help needed turning from your back to your side while in a flat bed without using bedrails?: A Little Help needed moving from lying on your back to sitting on the side of a flat bed without using bedrails?: A Lot Help needed moving to and from a bed to a chair (including a wheelchair)?: A Lot Help needed standing up from a chair using your arms (e.g., wheelchair or bedside chair)?: A Lot Help needed to walk in hospital room?: Total Help needed climbing 3-5 steps with a railing? : Total 6 Click Score: 11    End of Session Equipment Utilized During Treatment: Back brace Activity Tolerance: Patient tolerated treatment well Patient left: with call bell/phone within reach;in chair;with chair alarm set;with nursing/sitter in room (Brace applied) Nurse Communication: Mobility status;Precautions;Weight bearing status PT Visit Diagnosis: Other abnormalities of gait and mobility (R26.89);Muscle weakness (generalized) (M62.81);Other symptoms and signs involving the nervous system (R29.898);Unsteadiness on feet (R26.81);Difficulty in walking, not elsewhere classified (R26.2);Repeated falls (R29.6);History of falling (Z91.81);Pain Pain - part of body:  (back and Lt hip)     Time: 9147-8295 PT Time Calculation (min) (ACUTE ONLY): 25 min  Charges:    $Therapeutic Activity: 23-37 mins PT General Charges $$ ACUTE PT VISIT: 1 Visit  Patricia Fitzpatrick, PT, DPT [facility]  Rehabilitation Services Physical Therapist Office: [phone] Website: [email or web address]    Patricia Fitzpatrick 10/01/2022, 4:23 PM

## 2022-10-02 DIAGNOSIS — S32000A Wedge compression fracture of unspecified lumbar vertebra, initial encounter for closed fracture: Secondary | ICD-10-CM | POA: Diagnosis not present

## 2022-10-02 NOTE — Plan of Care (Signed)

## 2022-10-02 NOTE — Progress Notes (Signed)
PROGRESS NOTE    Patricia Fitzpatrick  EXB:[id number] DOB: 1945-07-15 DOA: 09/29/2022 PCP: [name], MD    Brief Narrative:   Patricia Fitzpatrick is a 77 y.o. female with past medical history significant for Parkinson's disease, depression, essential hypertension and recent hospitalization with L1 lumbar vertebral compression fracture, pubic ramus fracture and closed displaced fracture greater trochanter left femur who presented to [name] ED from home on 8/14 with progressive weakness, recurrent falls and continued/increased low back pain.  Patient was recently discharged from SNF but felt that still needed aggressive rehab.  Since returning home has progressively become weaker with decreased mobilization.  Patient is not headache, no visual changes, no chest pain, no palpitations, no shortness of breath, no abdominal pain, no fever/chills/night sweats, no nausea/vomit/diarrhea, no cough/congestion, no focal weakness, no fatigue, no paresthesias.     In the ED, temperature 98.0 F, HR 64, RR 18, BP 140/74, SpO2 96% on room air.  WBC 6.2, hemoglobin 13.3, platelet count 243.  Sodium 139, potassium 4.5, CO2 27, BUN 19, creatinine 0.5, glucose 106.  AST 20, ALT 14, total bilirubin 0.5.  CT head/C-spine with no acute findings/fracture or subluxation.  CT T/L-spine with numerous thoracic/lumbar wedge fractures T5, T7, T12, L1, L3 with near plana deformities T7 and T11.  Neurosurgery was consulted and given her anatomy, osteoporosis she is not a surgical candidate as the hardware would likely fail and recommended bracing, therapy evaluation, pain management and IR consultation.  EDP consulted [facility] for admission for further evaluation and management of recurrent falls associated with increased back pain and weakness related to thoracic/lumbar compression fractures.  Assessment & Plan:   Thoracic/lumbar spine compression fracture Patient presenting to ED with continued/progressive low back pain in the setting of  recurrent falls. CT T/L-spine with numerous thoracic/lumbar wedge fractures T5, T7, T12, L1, L3 with near plana deformities T7 and T11.  MR L-spine with acute/subacute compression fracture L1 vertebral body with 40% height loss and 5 mm bony retropulsion with mild spinal stenosis, additional chronic compression fractures T9, T11, T12, L3 with 7 mm retropulsion about the T11 fracture with mild spinal stenosis, mild degenerative disc disease with mild/moderate multilevel facet hypertrophy.  Neurosurgery was consulted and given her anatomy, osteoporosis she is not a surgical candidate as the hardware would likely fail and recommended bracing, therapy evaluation, pain management and IR consultation for consideration of kyphoplasty.  Vitamin D 25-hydroxy level 62.28, within normal limits. -- Neurosurgery, interventional radiology following, appreciate assistance -- Pending kyphoplasty 8/19 -- PT/OT evaluation: Recommend SNF placement -- Pain control with Tylenol/oxycodone -- Robaxin 500 mg p.o. q8h PRN Muscle spasms -- Continue brace while ambulating -- Fall precautions  Hx left hip hemiarthroplasty Patient underwent left hip hemiarthroplasty by Dr. [name] on 08/23/2020.  Recent new transverse fracture upper aspect greater trochanter left proximal femur 08/30/2022.  Evaluated by orthopedics, Dr. [name] last admission with recommendations of nonoperative management and WBAT with no active Abduction. -- Will need outpatient follow-up with Dr. [name] 2 weeks following hospitalization   Parkinson's disease -- Carbidopa/levodopa 25-100 mg 1.5 tablets 3 times daily   Essential hypertension -- Amlodipine 5 mg p.o. daily -- Hydralazine 10 mg p.o. every 8 hours as needed SBP >165 or DBP >110   Depression/anxiety -- Prozac 40 mg p.o. twice daily -- Clonazepam 0.5 mg p.o. twice daily   Recurrent falls Weakness/debility/deconditioning/gait disturbance: -- PT/OT recommend SNF placement -- TOC following   DVT  prophylaxis: SCDs Start: 09/29/22 1824    Code  Status: Full Code Family Communication: No family present at bedside this morning  Disposition Plan:  Level of care: Med-Surg Status is: Inpatient Remains inpatient appropriate because: Pending kyphoplasty, SNF placement      Consultants:  Neurosurgery, Dr. [name] Interventional radiology  Procedures:  Kyphoplasty: Pending  Antimicrobials:  None   Subjective: Patient seen and examined at bedside, lying in bed.  Reports slept good overnight, and does not feel as confused as she did yesterday morning. Pain controlled.  No other questions or concerns at this time.  Denies headache, no dizziness, no chest pain, no shortness of breath, no abdominal pain, no fever/chills/night sweats, no nausea/vomiting/diarrhea, no focal weakness, no fatigue, no paresthesias.  No acute events overnight per nurse staff.  Pending kyphoplasty 8/19, following will need SNF placement.  Objective: Vitals:   10/01/22 1945 10/01/22 2319 10/02/22 0311 10/02/22 0722  BP: 113/71 117/70 126/80 (!) 144/73  Pulse: 71 60 64 65  Resp: 16 16 16 14   Temp: 98.2 F (36.8 C) 97.8 F (36.6 C) 98.1 F (36.7 C) 98 F (36.7 C)  TempSrc: Oral Oral Oral Oral  SpO2: 94% 96% 99% 100%  Weight:      Height:        Intake/Output Summary (Last 24 hours) at 10/02/2022 0938 Last data filed at 10/02/2022 0836 Gross per 24 hour  Intake 414 ml  Output 353 ml  Net 61 ml   Filed Weights   09/29/22 1123 09/29/22 1819  Weight: 46.3 kg 44.6 kg    Examination:  Physical Exam: GEN: NAD, alert and oriented x 3, chronically ill/thin in appearance, mild tremors upper extremities at rest HEENT: NCAT, PERRL, EOMI, sclera clear, MMM PULM: CTAB w/o wheezes/crackles, normal respiratory effort CV: RRR w/o M/G/R GI: abd soft, NTND, NABS, no R/G/M MSK: no peripheral edema, muscle strength globally intact 5/5 bilateral upper/lower extremities NEURO: CN II-XII intact, slight resting  tremor, otherwise no focal deficits, sensation to light touch intact PSYCH: normal mood/flat affect Integumentary: dry/intact, no rashes or wounds    Data Reviewed: I have personally reviewed following labs and imaging studies  CBC: Recent Labs  Lab 09/29/22 1157  WBC 6.2  NEUTROABS 5.0  HGB 13.3  HCT 40.6  MCV 95.3  PLT 263   Basic Metabolic Panel: Recent Labs  Lab 09/29/22 1157  NA 139  K 4.5  CL 102  CO2 27  GLUCOSE 106*  BUN 19  CREATININE 0.85  CALCIUM 9.0  MG 2.0   GFR: Estimated Creatinine Clearance: 39.6 mL/min (by C-G formula based on SCr of 0.85 mg/dL). Liver Function Tests: Recent Labs  Lab 09/29/22 1157  AST 20  ALT 14  ALKPHOS 67  BILITOT 0.5  PROT 6.3*  ALBUMIN 3.3*   No results for input(s): "LIPASE", "AMYLASE" in the last 168 hours. No results for input(s): "AMMONIA" in the last 168 hours. Coagulation Profile: Recent Labs  Lab 10/01/22 0908  INR 1.0   Cardiac Enzymes: No results for input(s): "CKTOTAL", "CKMB", "CKMBINDEX", "TROPONINI" in the last 168 hours. BNP (last 3 results) No results for input(s): "PROBNP" in the last 8760 hours. HbA1C: No results for input(s): "HGBA1C" in the last 72 hours. CBG: No results for input(s): "GLUCAP" in the last 168 hours. Lipid Profile: No results for input(s): "CHOL", "HDL", "LDLCALC", "TRIG", "CHOLHDL", "LDLDIRECT" in the last 72 hours. Thyroid Function Tests: No results for input(s): "TSH", "T4TOTAL", "FREET4", "T3FREE", "THYROIDAB" in the last 72 hours. Anemia Panel: No results for input(s): "VITAMINB12", "FOLATE", "FERRITIN", "TIBC", "IRON", "  RETICCTPCT" in the last 72 hours. Sepsis Labs: No results for input(s): "PROCALCITON", "LATICACIDVEN" in the last 168 hours.  No results found for this or any previous visit (from the past 240 hour(s)).       Radiology Studies: MR LUMBAR SPINE WO CONTRAST  Result Date: 10/01/2022 CLINICAL DATA:  Initial evaluation for compression fracture.  EXAM: MRI LUMBAR SPINE WITHOUT CONTRAST TECHNIQUE: Multiplanar, multisequence MR imaging of the lumbar spine was performed. No intravenous contrast was administered. COMPARISON:  Comparison made with prior CT from 09/29/2022. FINDINGS: Segmentation: Standard. Lowest well-formed disc space labeled the L5-S1 level. Alignment: Mild exaggeration of the normal lumbar lordosis. Focal kyphotic angulation at the level of T11 related to a compression fracture. No significant listhesis. Vertebrae: Acute to subacute compression fracture involving the L1 vertebral body is seen. Associated height loss measures up to 40% with 5 mm bony retropulsion. This is benign/mechanical in appearance. Mild chronic compression deformity of T9 with mild 25% height loss and trace bony retropulsion. Severe chronic compression deformity of T11 with vertebral plana formation and 7 mm bony retropulsion. Chronic compression deformity of T12 with up to 70% height loss and 3 mm bony retropulsion. Chronic compression fracture of L3 with 30% height loss and no more than trace 2 mm bony retropulsion. Otherwise, vertebral body height maintained. Underlying bone marrow signal intensity within normal limits. Few scattered benign hemangiomata noted. No worrisome osseous lesions. No other abnormal marrow edema. Conus medullaris and cauda equina: Conus extends to the L2 level. Distal aspect of the cord is draped over the retropulsed chronic T11 fracture. Conus otherwise within normal limits. Nerve roots of the cauda equina within normal limits. Paraspinal and other soft tissues: Paraspinous soft tissues demonstrate no acute finding. Few scattered simple cyst noted about the visualized kidneys, benign in appearance, no follow-up imaging recommended. Disc levels: T10-11: 7 mm bony retropulsion related to the chronic T11 fracture. Retropulsed bone indents the ventral thecal sac. Secondary mild cord flattening without cord signal changes. Mild facet spurring.  Resultant mild spinal stenosis. Mild left foraminal narrowing. Right neural foramen remains patent. T11-12: 3 mm bony retropulsion related to the chronic T12 fracture. Mild facet hypertrophy. No significant spinal stenosis. Foramina remain patent. T12-L1: 5 mm bony retropulsion related to the acute to subacute L1 fracture. Flattening of the ventral thecal sac with resultant mild spinal stenosis. Foramina remain patent. L1-2:  Negative interspace.  Mild facet hypertrophy.  No stenosis. L2-3: Trace bony retropulsion related to the chronic L3 fracture. Superimposed small left foraminal disc protrusion. Mild facet hypertrophy. No significant spinal stenosis. Foramina remain patent. L3-4: Disc desiccation. Small left foraminal disc protrusion is seen. Mild facet hypertrophy. No spinal stenosis. Foramina remain adequately patent. L4-5: Disc desiccation. Small left foraminal disc protrusion with annular fissure. Moderate facet hypertrophy. No spinal stenosis. Foramina remain adequately patent. L5-S1: Minimal disc bulge. Moderate right with mild left facet hypertrophy. No spinal stenosis. Foramina remain patent. IMPRESSION: 1. Acute to subacute compression fracture involving the L1 vertebral body with up to 40% height loss and 5 mm bony retropulsion. Resultant mild spinal stenosis at this level. 2. Additional chronic compression fractures of T9, T11, T12, and L3 as detailed above. 7 mm retropulsion about the T11 fracture with mild spinal stenosis. 3. Underlying mild for age degenerative disc disease with mild-to-moderate multilevel facet hypertrophy. No other significant stenosis. Electronically Signed   By: Rise Mu M.D.   On: 10/01/2022 05:47   DG HIP UNILAT WITH PELVIS 2-3 VIEWS LEFT  Result Date:  09/30/2022 CLINICAL DATA:  Left hip pain.  Previous fall. EXAM: DG HIP (WITH OR WITHOUT PELVIS) 2-3V LEFT COMPARISON:  06/02/2022 FINDINGS: Postoperative change from left hip arthroplasty. No signs of acute  periprosthetic fracture or dislocation. Again seen are chronic fracture deformities involving the left superior and inferior pubic rami. Remote parasymphyseal right pubic bone fracture is also again seen in appears unchanged. IMPRESSION: 1. No acute findings. 2. Left hip arthroplasty. 3. Chronic fracture deformities involving the left superior and inferior pubic rami and right parasymphyseal pubic bone. Electronically Signed   By: Signa Kell M.D.   On: 09/30/2022 16:42        Scheduled Meds:  amLODipine  5 mg Oral Daily   carbidopa-levodopa  1.5 tablet Oral TID   clonazepam  0.25 mg Oral q AM   And   clonazePAM  0.5 mg Oral QHS   feeding supplement  237 mL Oral BID BM   FLUoxetine  40 mg Oral BID   Continuous Infusions:  [START ON 10/04/2022]  ceFAZolin (ANCEF) IV       LOS: 1 day    Time spent: 43 minutes spent on chart review, discussion with nursing staff, consultants, updating family and interview/physical exam; more than 50% of that time was spent in counseling and/or coordination of care.    Alvira Philips [place], DO [facility] Available via Epic secure chat 7am-7pm After these hours, please refer to coverage provider listed on [email or web address] 10/02/2022, 9:38 AM

## 2022-10-02 NOTE — Progress Notes (Signed)
Orthopedic Tech Progress Note Patient Details:  Patricia Fitzpatrick 12-10-1945 [id number]  Pt was brought her back brace from home by her daughter on 8/15. As of right now, she is not needing a 2nd LSO so the order will be "completed" to clear it from our list. Please don't hesitate to reach out to ortho tech's if further DME is needed.  Patient ID: Patricia Fitzpatrick, female   DOB: 02/05/1946, 77 y.o.   MRN: [id number]  Docia Furl 10/02/2022, 7:24 AM

## 2022-10-03 DIAGNOSIS — S32000A Wedge compression fracture of unspecified lumbar vertebra, initial encounter for closed fracture: Secondary | ICD-10-CM | POA: Diagnosis not present

## 2022-10-03 MED ORDER — SODIUM CHLORIDE 0.9 % IV SOLN: INTRAVENOUS | Status: AC

## 2022-10-03 NOTE — Progress Notes (Signed)
PROGRESS NOTE    Patricia Fitzpatrick  UYQ:[id number] DOB: April 18, 1945 DOA: 09/29/2022 PCP: [name], MD    Brief Narrative:   Patricia Fitzpatrick is a 77 y.o. female with past medical history significant for Parkinson's disease, depression, essential hypertension and recent hospitalization with L1 lumbar vertebral compression fracture, pubic ramus fracture and closed displaced fracture greater trochanter left femur who presented to [name] ED from home on 8/14 with progressive weakness, recurrent falls and continued/increased low back pain.  Patient was recently discharged from SNF but felt that still needed aggressive rehab.  Since returning home has progressively become weaker with decreased mobilization.  Patient is not headache, no visual changes, no chest pain, no palpitations, no shortness of breath, no abdominal pain, no fever/chills/night sweats, no nausea/vomit/diarrhea, no cough/congestion, no focal weakness, no fatigue, no paresthesias.     In the ED, temperature 98.0 F, HR 64, RR 18, BP 140/74, SpO2 96% on room air.  WBC 6.2, hemoglobin 13.3, platelet count 243.  Sodium 139, potassium 4.5, CO2 27, BUN 19, creatinine 0.5, glucose 106.  AST 20, ALT 14, total bilirubin 0.5.  CT head/C-spine with no acute findings/fracture or subluxation.  CT T/L-spine with numerous thoracic/lumbar wedge fractures T5, T7, T12, L1, L3 with near plana deformities T7 and T11.  Neurosurgery was consulted and given her anatomy, osteoporosis she is not a surgical candidate as the hardware would likely fail and recommended bracing, therapy evaluation, pain management and IR consultation.  EDP consulted [facility] for admission for further evaluation and management of recurrent falls associated with increased back pain and weakness related to thoracic/lumbar compression fractures.  Assessment & Plan:   Thoracic/lumbar spine compression fracture Patient presenting to ED with continued/progressive low back pain in the setting of  recurrent falls. CT T/L-spine with numerous thoracic/lumbar wedge fractures T5, T7, T12, L1, L3 with near plana deformities T7 and T11.  MR L-spine with acute/subacute compression fracture L1 vertebral body with 40% height loss and 5 mm bony retropulsion with mild spinal stenosis, additional chronic compression fractures T9, T11, T12, L3 with 7 mm retropulsion about the T11 fracture with mild spinal stenosis, mild degenerative disc disease with mild/moderate multilevel facet hypertrophy.  Neurosurgery was consulted and given her anatomy, osteoporosis she is not a surgical candidate as the hardware would likely fail and recommended bracing, therapy evaluation, pain management and IR consultation for consideration of kyphoplasty.  Vitamin D 25-hydroxy level 62.28, within normal limits. -- Neurosurgery, interventional radiology following, appreciate assistance -- Pending kyphoplasty 8/19; n.p.o. after midnight -- PT/OT evaluation: Recommend SNF placement -- Pain control with Tylenol/oxycodone -- Robaxin 500 mg p.o. q8h PRN Muscle spasms -- Continue brace while ambulating -- Fall precautions  Hx left hip hemiarthroplasty Patient underwent left hip hemiarthroplasty by Dr. [name] on 08/23/2020.  Recent new transverse fracture upper aspect greater trochanter left proximal femur 08/30/2022.  Evaluated by orthopedics, Dr. [name] last admission with recommendations of nonoperative management and WBAT with no active Abduction. -- Will need outpatient follow-up with Dr. [name] 2 weeks following hospitalization   Parkinson's disease -- Carbidopa/levodopa 25-100 mg 1.5 tablets 3 times daily   Essential hypertension -- Amlodipine 5 mg p.o. daily -- Hydralazine 10 mg p.o. every 8 hours as needed SBP >165 or DBP >110   Depression/anxiety -- Prozac 40 mg p.o. twice daily -- Clonazepam 0.5 mg p.o. twice daily   Recurrent falls Weakness/debility/deconditioning/gait disturbance: -- PT/OT recommend SNF placement --  TOC following   DVT prophylaxis: SCDs Start: 09/29/22 1824  Code Status: Full Code Family Communication: No family present at bedside this morning  Disposition Plan:  Level of care: Med-Surg Status is: Inpatient Remains inpatient appropriate because: Pending kyphoplasty, SNF placement      Consultants:  Neurosurgery, Dr. [name] Interventional radiology  Procedures:  Kyphoplasty: Pending  Antimicrobials:  None   Subjective: Patient seen and examined at bedside, lying in bed.  No specific complaints this morning.  Pending kyphoplasty for tomorrow.  Asking if the procedure will "hurt". Pain remains controlled.  No other questions or concerns at this time.  Denies headache, no dizziness, no chest pain, no shortness of breath, no abdominal pain, no fever/chills/night sweats, no nausea/vomiting/diarrhea, no focal weakness, no fatigue, no paresthesias.  No acute events overnight per nurse staff.  Pending kyphoplasty tomorrow, following will need SNF placement.  Objective: Vitals:   10/02/22 1929 10/02/22 2325 10/03/22 0322 10/03/22 0718  BP: 118/67 126/78 126/71 139/74  Pulse: 75 64 64 (!) 58  Resp: 14 13 15 16   Temp: 97.9 F (36.6 C) 97.9 F (36.6 C) 97.9 F (36.6 C) 98.4 F (36.9 C)  TempSrc: Oral Oral Oral Oral  SpO2: 95% 95% 97% 96%  Weight:      Height:        Intake/Output Summary (Last 24 hours) at 10/03/2022 1001 Last data filed at 10/02/2022 1800 Gross per 24 hour  Intake 118 ml  Output 320 ml  Net -202 ml   Filed Weights   09/29/22 1123 09/29/22 1819  Weight: 46.3 kg 44.6 kg    Examination:  Physical Exam: GEN: NAD, alert and oriented x 3, chronically ill/thin in appearance, mild tremors upper extremities at rest HEENT: NCAT, PERRL, EOMI, sclera clear, MMM PULM: CTAB w/o wheezes/crackles, normal respiratory effort CV: RRR w/o M/G/R GI: abd soft, NTND, NABS, no R/G/M MSK: no peripheral edema, muscle strength globally intact 5/5 bilateral upper/lower  extremities NEURO: CN II-XII intact, slight resting tremor, otherwise no focal deficits, sensation to light touch intact PSYCH: normal mood/flat affect Integumentary: dry/intact, no rashes or wounds    Data Reviewed: I have personally reviewed following labs and imaging studies  CBC: Recent Labs  Lab 09/29/22 1157  WBC 6.2  NEUTROABS 5.0  HGB 13.3  HCT 40.6  MCV 95.3  PLT 263   Basic Metabolic Panel: Recent Labs  Lab 09/29/22 1157  NA 139  K 4.5  CL 102  CO2 27  GLUCOSE 106*  BUN 19  CREATININE 0.85  CALCIUM 9.0  MG 2.0   GFR: Estimated Creatinine Clearance: 39.6 mL/min (by C-G formula based on SCr of 0.85 mg/dL). Liver Function Tests: Recent Labs  Lab 09/29/22 1157  AST 20  ALT 14  ALKPHOS 67  BILITOT 0.5  PROT 6.3*  ALBUMIN 3.3*   No results for input(s): "LIPASE", "AMYLASE" in the last 168 hours. No results for input(s): "AMMONIA" in the last 168 hours. Coagulation Profile: Recent Labs  Lab 10/01/22 0908  INR 1.0   Cardiac Enzymes: No results for input(s): "CKTOTAL", "CKMB", "CKMBINDEX", "TROPONINI" in the last 168 hours. BNP (last 3 results) No results for input(s): "PROBNP" in the last 8760 hours. HbA1C: No results for input(s): "HGBA1C" in the last 72 hours. CBG: No results for input(s): "GLUCAP" in the last 168 hours. Lipid Profile: No results for input(s): "CHOL", "HDL", "LDLCALC", "TRIG", "CHOLHDL", "LDLDIRECT" in the last 72 hours. Thyroid Function Tests: No results for input(s): "TSH", "T4TOTAL", "FREET4", "T3FREE", "THYROIDAB" in the last 72 hours. Anemia Panel: No results for input(s): "VITAMINB12", "  FOLATE", "FERRITIN", "TIBC", "IRON", "RETICCTPCT" in the last 72 hours. Sepsis Labs: No results for input(s): "PROCALCITON", "LATICACIDVEN" in the last 168 hours.  No results found for this or any previous visit (from the past 240 hour(s)).       Radiology Studies: No results found.      Scheduled Meds:  amLODipine  5 mg  Oral Daily   carbidopa-levodopa  1.5 tablet Oral TID   clonazepam  0.25 mg Oral q AM   And   clonazePAM  0.5 mg Oral QHS   feeding supplement  237 mL Oral BID BM   FLUoxetine  40 mg Oral BID   Continuous Infusions:  [START ON 10/04/2022]  ceFAZolin (ANCEF) IV       LOS: 2 days    Time spent: 43 minutes spent on chart review, discussion with nursing staff, consultants, updating family and interview/physical exam; more than 50% of that time was spent in counseling and/or coordination of care.    Alvira Philips [place], DO [facility] Available via Epic secure chat 7am-7pm After these hours, please refer to coverage provider listed on [email or web address] 10/03/2022, 10:01 AM

## 2022-10-03 NOTE — Plan of Care (Signed)
  Problem: Health Behavior/Discharge Planning: Goal: Ability to manage health-related needs will improve Outcome: Progressing   Problem: Activity: Goal: Risk for activity intolerance will decrease Outcome: Progressing   Problem: Coping: Goal: Level of anxiety will decrease Outcome: Progressing   Problem: Pain Managment: Goal: General experience of comfort will improve Outcome: Progressing   Problem: Safety: Goal: Ability to remain free from injury will improve Outcome: Progressing   

## 2022-10-03 NOTE — Plan of Care (Signed)
  Problem: Health Behavior/Discharge Planning: Goal: Ability to manage health-related needs will improve Outcome: Progressing   

## 2022-10-04 ENCOUNTER — Inpatient Hospital Stay (HOSPITAL_COMMUNITY): Payer: Medicare PPO

## 2022-10-04 ENCOUNTER — Encounter (HOSPITAL_COMMUNITY): Payer: Self-pay | Admitting: Internal Medicine

## 2022-10-04 DIAGNOSIS — S32000A Wedge compression fracture of unspecified lumbar vertebra, initial encounter for closed fracture: Secondary | ICD-10-CM | POA: Diagnosis not present

## 2022-10-04 HISTORY — PX: IR KYPHO THORACIC WITH BONE BIOPSY: IMG5518

## 2022-10-04 LAB — BASIC METABOLIC PANEL
Anion gap: 10 (ref 5–15)
BUN: 20 mg/dL (ref 8–23)
CO2: 24 mmol/L (ref 22–32)
Calcium: 8.4 mg/dL — ABNORMAL LOW (ref 8.9–10.3)
Chloride: 103 mmol/L (ref 98–111)
Creatinine, Ser: 0.78 mg/dL (ref 0.44–1.00)
GFR, Estimated: >60 mL/min (ref >60)
Glucose, Bld: 84 mg/dL (ref 70–99)
Potassium: 4 mmol/L (ref 3.5–5.1)
Sodium: 137 mmol/L (ref 135–145)

## 2022-10-04 LAB — CBC
HCT: 38.5 % (ref 36.0–46.0)
Hemoglobin: 12.3 g/dL (ref 12.0–15.0)
MCH: 30.7 pg (ref 26.0–34.0)
MCHC: 31.9 g/dL (ref 30.0–36.0)
MCV: 96 fL (ref 80.0–100.0)
Platelets: 263 10*3/uL (ref 150–400)
RBC: 4.01 MIL/uL (ref 3.87–5.11)
RDW: 12.7 % (ref 11.5–15.5)
WBC: 6.9 10*3/uL (ref 4.0–10.5)
nRBC: 0 % (ref 0.0–0.2)

## 2022-10-04 LAB — PROTIME-INR
INR: 1 (ref 0.8–1.2)
Prothrombin Time: 13 seconds (ref 11.4–15.2)

## 2022-10-04 MED ORDER — SODIUM CHLORIDE 0.9 % IV SOLN: INTRAVENOUS | Status: AC

## 2022-10-04 MED ORDER — BUPIVACAINE HCL 0.25 % IJ SOLN
30.0000 mL | Freq: Once | INTRAMUSCULAR | Status: AC
  Administered 2022-10-04: 12 mL
  Filled 2022-10-04: qty 30

## 2022-10-04 MED ORDER — FENTANYL CITRATE (PF) 100 MCG/2ML IJ SOLN
INTRAMUSCULAR | Status: AC | PRN
  Administered 2022-10-04: 25 ug via INTRAVENOUS

## 2022-10-04 MED ORDER — CEFAZOLIN SODIUM-DEXTROSE 2-4 GM/100ML-% IV SOLN
INTRAVENOUS | Status: AC | PRN
  Administered 2022-10-04: 2 g via INTRAVENOUS

## 2022-10-04 MED ORDER — CEFAZOLIN SODIUM-DEXTROSE 2-4 GM/100ML-% IV SOLN
INTRAVENOUS | Status: AC
  Filled 2022-10-04: qty 100

## 2022-10-04 MED ORDER — FENTANYL CITRATE (PF) 100 MCG/2ML IJ SOLN
INTRAMUSCULAR | Status: AC
  Filled 2022-10-04: qty 6

## 2022-10-04 MED ORDER — MIDAZOLAM HCL 2 MG/2ML IJ SOLN
INTRAMUSCULAR | Status: AC | PRN
  Administered 2022-10-04: 1 mg via INTRAVENOUS

## 2022-10-04 MED ORDER — TOBRAMYCIN SULFATE 1.2 G IJ SOLR
INTRAMUSCULAR | Status: AC
  Filled 2022-10-04: qty 1.2

## 2022-10-04 MED ORDER — IOHEXOL 300 MG/ML  SOLN: 50.0000 mL | Freq: Once | INTRAMUSCULAR | Status: DC | PRN

## 2022-10-04 MED ORDER — MIDAZOLAM HCL 2 MG/2ML IJ SOLN
INTRAMUSCULAR | Status: AC
  Filled 2022-10-04: qty 6

## 2022-10-04 MED ORDER — BUPIVACAINE HCL (PF) 0.25 % IJ SOLN
INTRAMUSCULAR | Status: AC
  Filled 2022-10-04: qty 30

## 2022-10-04 NOTE — Discharge Instructions (Signed)
INR.  1.  No stooping ,bending  or lifting weights above 10 pounds for 2 weeks.  2.  Use a walker to ambulate for 2 weeks.  3.  No driving for  2 weeks.  4.  Check as needed with requesting physician in 2 weeks

## 2022-10-04 NOTE — Plan of Care (Signed)
  Problem: Health Behavior/Discharge Planning: Goal: Ability to manage health-related needs will improve 10/04/2022 0000 by Saddie Benders, RN Outcome: Progressing 10/03/2022 2344 by Saddie Benders, RN Outcome: Progressing

## 2022-10-04 NOTE — Progress Notes (Signed)
PROGRESS NOTE    Patricia Fitzpatrick  ZOX:[id number] DOB: 04/26/1945 DOA: 09/29/2022 PCP: [name], MD    Brief Narrative:   Patricia Fitzpatrick is a 77 y.o. female with past medical history significant for Parkinson's disease, depression, essential hypertension and recent hospitalization with L1 lumbar vertebral compression fracture, pubic ramus fracture and closed displaced fracture greater trochanter left femur who presented to [name] ED from home on 8/14 with progressive weakness, recurrent falls and continued/increased low back pain.  Patient was recently discharged from SNF but felt that still needed aggressive rehab.  Since returning home has progressively become weaker with decreased mobilization.  Patient is not headache, no visual changes, no chest pain, no palpitations, no shortness of breath, no abdominal pain, no fever/chills/night sweats, no nausea/vomit/diarrhea, no cough/congestion, no focal weakness, no fatigue, no paresthesias.     In the ED, temperature 98.0 F, HR 64, RR 18, BP 140/74, SpO2 96% on room air.  WBC 6.2, hemoglobin 13.3, platelet count 243.  Sodium 139, potassium 4.5, CO2 27, BUN 19, creatinine 0.5, glucose 106.  AST 20, ALT 14, total bilirubin 0.5.  CT head/C-spine with no acute findings/fracture or subluxation.  CT T/L-spine with numerous thoracic/lumbar wedge fractures T5, T7, T12, L1, L3 with near plana deformities T7 and T11.  Neurosurgery was consulted and given her anatomy, osteoporosis she is not a surgical candidate as the hardware would likely fail and recommended bracing, therapy evaluation, pain management and IR consultation.  EDP consulted [facility] for admission for further evaluation and management of recurrent falls associated with increased back pain and weakness related to thoracic/lumbar compression fractures.  Assessment & Plan:   Thoracic/lumbar spine compression fracture Patient presenting to ED with continued/progressive low back pain in the setting of  recurrent falls. CT T/L-spine with numerous thoracic/lumbar wedge fractures T5, T7, T12, L1, L3 with near plana deformities T7 and T11.  MR L-spine with acute/subacute compression fracture L1 vertebral body with 40% height loss and 5 mm bony retropulsion with mild spinal stenosis, additional chronic compression fractures T9, T11, T12, L3 with 7 mm retropulsion about the T11 fracture with mild spinal stenosis, mild degenerative disc disease with mild/moderate multilevel facet hypertrophy.  Neurosurgery was consulted and given her anatomy, osteoporosis she is not a surgical candidate as the hardware would likely fail and recommended bracing, therapy evaluation, pain management and IR consultation for consideration of kyphoplasty.  Vitamin D 25-hydroxy level 62.28, within normal limits. -- Neurosurgery, interventional radiology following, appreciate assistance -- Pending kyphoplasty today; n.p.o.  -- PT/OT evaluation: Recommend SNF placement -- Pain control with Tylenol/oxycodone -- Robaxin 500 mg p.o. q8h PRN Muscle spasms -- Continue brace while ambulating -- Fall precautions  Hx left hip hemiarthroplasty Patient underwent left hip hemiarthroplasty by Dr. [name] on 08/23/2020.  Recent new transverse fracture upper aspect greater trochanter left proximal femur 08/30/2022.  Evaluated by orthopedics, Dr. [name] last admission with recommendations of nonoperative management and WBAT with no active Abduction. -- Will need outpatient follow-up with Dr. [name] 2 weeks following hospitalization   Parkinson's disease -- Carbidopa/levodopa 25-100 mg 1.5 tablets 3 times daily   Essential hypertension -- Amlodipine 5 mg p.o. daily -- Hydralazine 10 mg p.o. every 8 hours as needed SBP >165 or DBP >110   Depression/anxiety -- Prozac 40 mg p.o. twice daily -- Clonazepam 0.5 mg p.o. twice daily   Recurrent falls Weakness/debility/deconditioning/gait disturbance: -- PT/OT recommend SNF placement -- TOC  following   DVT prophylaxis: SCDs Start: 09/29/22 1824  Code Status: Full Code Family Communication: No family present at bedside this morning  Disposition Plan:  Level of care: Med-Surg Status is: Inpatient Remains inpatient appropriate because: Pending kyphoplasty, SNF placement   Consultants:  Neurosurgery, Dr. [name] Interventional radiology  Procedures:  Kyphoplasty: Pending  Antimicrobials:  None   Subjective: Patient seen and examined at bedside, lying in bed.  Sleeping but arousable.  Pain remains controlled.  Pending kyphoplasty planned by IR today.  No other questions or concerns at this time.    Denies headache, no dizziness, no chest pain, no shortness of breath, no abdominal pain, no fever/chills/night sweats, no nausea/vomiting/diarrhea, no focal weakness, no fatigue, no paresthesias.  No acute events overnight per nurse staff.  Will need SNF placement following kyphoplasty, discussed with social work this morning.  Objective: Vitals:   10/03/22 1509 10/03/22 2055 10/04/22 0600 10/04/22 0711  BP: 114/65 133/75 128/65 119/66  Pulse: 70 65 60 (!) 58  Resp: (!) 22 20  20   Temp: 98.2 F (36.8 C) 98.4 F (36.9 C) 98.3 F (36.8 C) 97.6 F (36.4 C)  TempSrc: Oral Oral Oral Oral  SpO2: 96% 96% 99% 96%  Weight:      Height:        Intake/Output Summary (Last 24 hours) at 10/04/2022 0932 Last data filed at 10/04/2022 0836 Gross per 24 hour  Intake 1536.15 ml  Output --  Net 1536.15 ml   Filed Weights   09/29/22 1123 09/29/22 1819  Weight: 46.3 kg 44.6 kg    Examination:  Physical Exam: GEN: NAD, alert and oriented x 3, chronically ill/thin in appearance, mild tremors upper extremities at rest HEENT: NCAT, PERRL, EOMI, sclera clear, MMM PULM: CTAB w/o wheezes/crackles, normal respiratory effort CV: RRR w/o M/G/R GI: abd soft, NTND, NABS, no R/G/M MSK: no peripheral edema, muscle strength globally intact 5/5 bilateral upper/lower extremities NEURO: CN  II-XII intact, slight resting tremor, otherwise no focal deficits, sensation to light touch intact PSYCH: normal mood/flat affect Integumentary: dry/intact, no rashes or wounds    Data Reviewed: I have personally reviewed following labs and imaging studies  CBC: Recent Labs  Lab 09/29/22 1157  WBC 6.2  NEUTROABS 5.0  HGB 13.3  HCT 40.6  MCV 95.3  PLT 263   Basic Metabolic Panel: Recent Labs  Lab 09/29/22 1157  NA 139  K 4.5  CL 102  CO2 27  GLUCOSE 106*  BUN 19  CREATININE 0.85  CALCIUM 9.0  MG 2.0   GFR: Estimated Creatinine Clearance: 39.6 mL/min (by C-G formula based on SCr of 0.85 mg/dL). Liver Function Tests: Recent Labs  Lab 09/29/22 1157  AST 20  ALT 14  ALKPHOS 67  BILITOT 0.5  PROT 6.3*  ALBUMIN 3.3*   No results for input(s): "LIPASE", "AMYLASE" in the last 168 hours. No results for input(s): "AMMONIA" in the last 168 hours. Coagulation Profile: Recent Labs  Lab 10/01/22 0908  INR 1.0   Cardiac Enzymes: No results for input(s): "CKTOTAL", "CKMB", "CKMBINDEX", "TROPONINI" in the last 168 hours. BNP (last 3 results) No results for input(s): "PROBNP" in the last 8760 hours. HbA1C: No results for input(s): "HGBA1C" in the last 72 hours. CBG: No results for input(s): "GLUCAP" in the last 168 hours. Lipid Profile: No results for input(s): "CHOL", "HDL", "LDLCALC", "TRIG", "CHOLHDL", "LDLDIRECT" in the last 72 hours. Thyroid Function Tests: No results for input(s): "TSH", "T4TOTAL", "FREET4", "T3FREE", "THYROIDAB" in the last 72 hours. Anemia Panel: No results for input(s): "VITAMINB12", "FOLATE", "FERRITIN", "TIBC", "  IRON", "RETICCTPCT" in the last 72 hours. Sepsis Labs: No results for input(s): "PROCALCITON", "LATICACIDVEN" in the last 168 hours.  No results found for this or any previous visit (from the past 240 hour(s)).       Radiology Studies: No results found.      Scheduled Meds:  amLODipine  5 mg Oral Daily    carbidopa-levodopa  1.5 tablet Oral TID   clonazepam  0.25 mg Oral q AM   And   clonazePAM  0.5 mg Oral QHS   feeding supplement  237 mL Oral BID BM   FLUoxetine  40 mg Oral BID   Continuous Infusions:  sodium chloride Stopped (10/04/22 0837)     LOS: 3 days    Time spent: 43 minutes spent on chart review, discussion with nursing staff, consultants, updating family and interview/physical exam; more than 50% of that time was spent in counseling and/or coordination of care.    Alvira Philips [place], DO [facility] Available via Epic secure chat 7am-7pm After these hours, please refer to coverage provider listed on [email or web address] 10/04/2022, 9:32 AM

## 2022-10-04 NOTE — Plan of Care (Signed)

## 2022-10-04 NOTE — Progress Notes (Signed)
PT Cancellation Note  Patient Details Name: Patricia Fitzpatrick MRN: [id number] DOB: 1945-09-21   Cancelled Treatment:    Reason Eval/Treat Not Completed: Patient not medically ready  Underwent kyphoplasty, currently on bed rest for 3 hours. Will follow-up tomorrow for PT as appropriate.  Kathlyn Sacramento, PT, DPT [facility]  Rehabilitation Services Physical Therapist Office: [phone] Website: [email or web address]   Berton Mount 10/04/2022, 3:25 PM

## 2022-10-04 NOTE — Care Management Important Message (Signed)
Important Message  Patient Details  Name: Patricia Fitzpatrick MRN: [id number] Date of Birth: 1945/02/25   Medicare Important Message Given:  Yes     [name] Stefan Church 10/04/2022, 4:18 PM

## 2022-10-04 NOTE — Care Management Important Message (Signed)
Important Message  Patient Details  Name: Patricia Fitzpatrick MRN: [id number] Date of Birth: [birth year]   Medicare Important Message Given:  Yes     Dorena Bodo 10/04/2022, 4:21 PM

## 2022-10-04 NOTE — TOC Progression Note (Signed)
Transition of Care [facility]) - Progression Note    Patient Details  Name: Patricia Fitzpatrick MRN: [id number] Date of Birth: 01-27-1946  Transition of Care [facility]) CM/SW Contact  Baldemar Lenis, [place] Phone Number: 10/04/2022, 3:18 PM  Clinical Narrative:   CSW met with patient to discuss SNF options. Patient said she was not as happy with [facility] after this last experience, may want to consider somewhere else instead, would like to discuss other options. CSW provided patient with other options and received permission to update stepdaughter, [name]. CSW spoke with [name] to provide other SNF options, she will review. CSW to follow.    Expected Discharge Plan: Skilled Nursing Facility Barriers to Discharge: Continued Medical Work up, English as a second language teacher  Expected Discharge Plan and Services     Post Acute Care Choice: Skilled Nursing Facility Living arrangements for the past 2 months: Single Family Home                                       Social Determinants of Health (SDOH) Interventions SDOH Screenings   Food Insecurity: No Food Insecurity (09/30/2022)  Housing: Low Risk  (09/30/2022)  Transportation Needs: No Transportation Needs (09/30/2022)  Utilities: Not At Risk (09/30/2022)  Depression (PHQ2-9): High Risk (08/17/2022)  Tobacco Use: Low Risk  (10/04/2022)    Readmission Risk Interventions     No data to display

## 2022-10-04 NOTE — Procedures (Signed)
INR.  Status post L1 1 kyphoplasty.  Right transpedicular approach.  No acute complications.  Patient  tolerated the procedure well.  Fatima Sanger MD.

## 2022-10-05 ENCOUNTER — Telehealth: Payer: Self-pay | Admitting: Adult Health

## 2022-10-05 DIAGNOSIS — S32000A Wedge compression fracture of unspecified lumbar vertebra, initial encounter for closed fracture: Secondary | ICD-10-CM | POA: Diagnosis not present

## 2022-10-05 MED ORDER — ENOXAPARIN SODIUM 30 MG/0.3ML IJ SOSY
30.0000 mg | PREFILLED_SYRINGE | INTRAMUSCULAR | Status: DC
  Administered 2022-10-06: 30 mg via SUBCUTANEOUS
  Filled 2022-10-05: qty 0.3

## 2022-10-05 NOTE — Telephone Encounter (Signed)
Patient's daughter [name] lvm at 3:49 stating that she saw [name] last week and after appt she was going to research a medication that would help with her Parkinson's Disease. 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 Ph: [phone] She is listed on DPR

## 2022-10-05 NOTE — Progress Notes (Signed)
Physical Therapy Treatment Patient Details Name: Patricia Fitzpatrick MRN: [id number] DOB: 24-Dec-1945 Today's Date: 10/05/2022   History of Present Illness 77 y.o. female presents to [facility] hospital on 09/29/2022 with progressive difficulty walking. 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 Pt fell recently (08/2022) and was diagnosed with pubic ramus fx, L1 compression fx and L femur greater trochanter fx (treated conservatively with PWB'ing went to SNF, D/C'd from [facility] 09/27/2022. PMH includes anxiety, depression, HLD, HTN, parkinsonism.    PT Comments  Pt resting in recliner with TLSO donned and OT finishing session. Pt agreeable to PT visit and denies pain in back or hip this date. Pt TLSO readjusted and changed for alternate TLSO in room as trunk fit was more petite and better suited for pt's frame. Pt required Mod assist for Sit<>stands from chair to RW with cues for hand placement however pt preferring to keep hands on walker. Pt required mod assist to facilitate anterior trunk lean for more forward posture and mod assist for weight shift and RW management to progress ambulation. Pt amb ~8' in room and then ~35' in hallway with close chair follow for safety. EOS pt agreeable to remain OOB, Alarm on and call bell within reach. Will progress as able.    If plan is discharge home, recommend the following: A lot of help with walking and/or transfers;A lot of help with bathing/dressing/bathroom;Assistance with cooking/housework;Help with stairs or ramp for entrance;Assist for transportation;Supervision due to cognitive status;Direct supervision/assist for medications management   Can travel by private vehicle     No  Equipment Recommendations  Wheelchair (measurements PT);Wheelchair cushion (measurements PT);BSC/3in1    Recommendations for Other  Services       Precautions / Restrictions Precautions Precautions: Fall;Back Precaution Comments: no active L hip abduction Required Braces or Orthoses: Spinal Brace Spinal Brace: Thoracolumbosacral orthotic;Applied in sitting position Restrictions Weight Bearing Restrictions: Yes LLE Weight Bearing: Weight bearing as tolerated Other Position/Activity Restrictions: no active L hip abduction;     Mobility  Bed Mobility Overal bed mobility: Needs Assistance Bed Mobility: Rolling, Sidelying to Sit Rolling: Min assist Sidelying to sit: Mod assist, Used rails       General bed mobility comments: cues through out with assistance to perform log roll    Transfers Overall transfer level: Needs assistance Equipment used: Rolling walker (2 wheels) Transfers: Sit to/from Stand Sit to Stand: Mod assist           General transfer comment: cues for hand placement, pt preferring to use both UE on RW for power up. Mod assist to complete rise and facilitate anterior weight shift.    Ambulation/Gait Ambulation/Gait assistance: Mod assist Gait Distance (Feet): 35 Feet (8, 35) Assistive device: Rolling walker (2 wheels) Gait Pattern/deviations: Step-to pattern, Shuffle, Narrow base of support, Trunk flexed, Drifts right/left, Decreased stride length, Leaning posteriorly Gait velocity: decr     General Gait Details: pt with low shuffled steps and tendency to veer Rt with gait. Mod assist for anterior weigth shift, RW management, and direction guidance.   Stairs             Wheelchair Mobility     Tilt Bed    Modified Rankin (Stroke Patients Only)       Balance Overall balance assessment: Needs assistance Sitting-balance support: Feet supported, Single extremity supported Sitting balance-Leahy Scale:  Poor Sitting balance - Comments: min assist initially and progressed to CGA Postural control: Posterior lean Standing balance support: Bilateral upper extremity  supported, Reliant on assistive device for balance Standing balance-Leahy Scale: Poor Standing balance comment: posterior lean                            Cognition Arousal: Alert Behavior During Therapy: WFL for tasks assessed/performed, Flat affect Overall Cognitive Status: No family/caregiver present to determine baseline cognitive functioning Area of Impairment: Attention, Memory, Following commands, Safety/judgement, Awareness, Problem solving                 Orientation Level: Time Current Attention Level: Sustained Memory: Decreased short-term memory, Decreased recall of precautions Following Commands: Follows multi-step commands inconsistently, Follows one step commands consistently, Follows one step commands with increased time, Follows multi-step commands with increased time Safety/Judgement: Decreased awareness of safety, Decreased awareness of deficits Awareness: Emergent Problem Solving: Slow processing, Difficulty sequencing, Requires verbal cues, Decreased initiation, Requires tactile cues          Exercises      General Comments        Pertinent Vitals/Pain Pain Assessment Pain Assessment: No/denies pain Pain Descriptors / Indicators: Other (Comment) (itching) Pain Intervention(s): Monitored during session, Limited activity within patient's tolerance, Repositioned    Home Living                          Prior Function            PT Goals (current goals can now be found in the care plan section) Acute Rehab PT Goals Patient Stated Goal: Get stronger, be independent PT Goal Formulation: With patient Time For Goal Achievement: 10/14/22 Potential to Achieve Goals: Fair Progress towards PT goals: Progressing toward goals    Frequency    Min 1X/week      PT Plan Current plan remains appropriate    Co-evaluation              AM-PAC PT "6 Clicks" Mobility   Outcome Measure  Help needed turning from your back to  your side while in a flat bed without using bedrails?: A Little Help needed moving from lying on your back to sitting on the side of a flat bed without using bedrails?: A Lot Help needed moving to and from a bed to a chair (including a wheelchair)?: A Lot Help needed standing up from a chair using your arms (e.g., wheelchair or bedside chair)?: A Lot Help needed to walk in hospital room?: A Lot Help needed climbing 3-5 steps with a railing? : Total 6 Click Score: 12    End of Session Equipment Utilized During Treatment: Back brace Activity Tolerance: Patient tolerated treatment well Patient left: in chair;with call bell/phone within reach;with chair alarm set Nurse Communication: Mobility status;Precautions;Weight bearing status PT Visit Diagnosis: Other abnormalities of gait and mobility (R26.89);Muscle weakness (generalized) (M62.81);Other symptoms and signs involving the nervous system (R29.898);Unsteadiness on feet (R26.81);Difficulty in walking, not elsewhere classified (R26.2);Repeated falls (R29.6);History of falling (Z91.81);Pain     Time: 2585-2778 PT Time Calculation (min) (ACUTE ONLY): 40 min  Charges:    $Gait Training: 23-37 mins $Therapeutic Activity: 8-22 mins PT General Charges $$ ACUTE PT VISIT: 1 Visit                     Wynn Maudlin, DPT Acute Rehabilitation Services Office [phone]  10/05/22 10:41 AM

## 2022-10-05 NOTE — Telephone Encounter (Signed)
Patricia Fitzpatrick notified that Neuro was managing Seroquel and considering Nuplazid for worsening psychosis and [name] would not prescribe it at this time.

## 2022-10-05 NOTE — Progress Notes (Signed)
Occupational Therapy Treatment Patient Details Name: Patricia Fitzpatrick MRN: [id number] DOB: [birth year] Today's Date: 10/05/2022   History of present illness 77 y.o. female presents to [facility] hospital on 09/29/2022 with progressive difficulty walking. 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 Pt fell recently (08/2022) and was diagnosed with pubic ramus fx, L1 compression fx and L femur greater trochanter fx (treated conservatively with PWB'ing went to SNF, D/C'd from [facility] 09/27/2022. PMH includes anxiety, depression, HLD, HTN, parkinsonism.   OT comments  Patient with no complaints of pain following yesterday's kyphoplasty.  Patient required education and mod assist for log rolling to EOB. Patient demonstrated posterior leaning while on EOB and max assist to donn back brace. Patient performed transfer to recliner with mod assist with RW and cues for hand placement and safety. Acute OT to continue to follow for self care and functional transfers.       If plan is discharge home, recommend the following:  A lot of help with walking and/or transfers;A lot of help with bathing/dressing/bathroom;Assistance with cooking/housework;Assistance with feeding;Help with stairs or ramp for entrance;Assist for transportation;Direct supervision/assist for financial management;Direct supervision/assist for medications management   Equipment Recommendations  Other (comment) (defer to next venue)    Recommendations for Other Services      Precautions / Restrictions Precautions Precautions: Fall;Back Precaution Comments: no active L hip abduction Required Braces or Orthoses: Spinal Brace Spinal Brace: Thoracolumbosacral orthotic;Applied in sitting position Restrictions Weight Bearing Restrictions: Yes LLE Weight Bearing: Weight bearing as tolerated Other  Position/Activity Restrictions: no active L hip abduction;       Mobility Bed Mobility Overal bed mobility: Needs Assistance Bed Mobility: Rolling, Sidelying to Sit Rolling: Min assist Sidelying to sit: Mod assist, Used rails       General bed mobility comments: cues through out with assistance to perform log roll    Transfers Overall transfer level: Needs assistance Equipment used: Rolling walker (2 wheels) Transfers: Sit to/from Stand, Bed to chair/wheelchair/BSC Sit to Stand: Mod assist     Step pivot transfers: Mod assist     General transfer comment: cues for hand placement and safety     Balance Overall balance assessment: Needs assistance Sitting-balance support: Feet supported, Single extremity supported Sitting balance-Leahy Scale: Poor Sitting balance - Comments: min assist initially and progressed to CGA Postural control: Posterior lean Standing balance support: Bilateral upper extremity supported, Reliant on assistive device for balance Standing balance-Leahy Scale: Poor Standing balance comment: posterior lean                           ADL either performed or assessed with clinical judgement   ADL Overall ADL's : Needs assistance/impaired     Grooming: Wash/dry hands;Wash/dry face;Brushing hair;Minimal assistance;Sitting Grooming Details (indicate cue type and reason): min assist to complete brushing hair Upper Body Bathing: Moderate assistance;Sitting Upper Body Bathing Details (indicate cue type and reason): for back     Upper Body Dressing : Maximal assistance;Sitting Upper Body Dressing Details (indicate cue type and reason): to donn brace Lower Body Dressing: Total assistance;Bed level Lower Body Dressing Details (indicate cue type and reason): for socks                    Extremity/Trunk Assessment  Vision       Perception     Praxis      Cognition Arousal: Alert Behavior During Therapy: WFL for  tasks assessed/performed, Flat affect Overall Cognitive Status: No family/caregiver present to determine baseline cognitive functioning Area of Impairment: Attention, Memory, Following commands, Safety/judgement, Awareness, Problem solving                 Orientation Level: Time Current Attention Level: Sustained Memory: Decreased short-term memory, Decreased recall of precautions Following Commands: Follows multi-step commands inconsistently, Follows one step commands consistently Safety/Judgement: Decreased awareness of safety, Decreased awareness of deficits Awareness: Emergent Problem Solving: Slow processing, Difficulty sequencing, Requires verbal cues, Decreased initiation, Requires tactile cues General Comments: mentioned yesterday's procedure multiple times, increased time to follow commands        Exercises      Shoulder Instructions       General Comments      Pertinent Vitals/ Pain       Pain Assessment Pain Assessment: No/denies pain Pain Descriptors / Indicators: Other (Comment) (itching) Pain Intervention(s): Monitored during session  Home Living                                          Prior Functioning/Environment              Frequency  Min 1X/week        Progress Toward Goals  OT Goals(current goals can now be found in the care plan section)  Progress towards OT goals: Progressing toward goals  Acute Rehab OT Goals Patient Stated Goal: get better OT Goal Formulation: With patient Time For Goal Achievement: 10/14/22 Potential to Achieve Goals: Fair ADL Goals Pt Will Perform Grooming: with set-up;with supervision;sitting Pt Will Perform Upper Body Bathing: with supervision;with set-up;sitting Pt Will Perform Upper Body Dressing: with set-up;with supervision;sitting Pt Will Perform Lower Body Dressing: with min assist;sit to/from stand Pt Will Transfer to Toilet: with min assist;stand pivot transfer;bedside  commode Additional ADL Goal #1: Pt will be min A in and OOB for basic ADLs  Plan      Co-evaluation                 AM-PAC OT "6 Clicks" Daily Activity     Outcome Measure   Help from another person eating meals?: A Little Help from another person taking care of personal grooming?: A Little Help from another person toileting, which includes using toliet, bedpan, or urinal?: A Lot Help from another person bathing (including washing, rinsing, drying)?: A Lot Help from another person to put on and taking off regular upper body clothing?: Total Help from another person to put on and taking off regular lower body clothing?: Total 6 Click Score: 12    End of Session Equipment Utilized During Treatment: Rolling walker (2 wheels);Back brace  OT Visit Diagnosis: Other abnormalities of gait and mobility (R26.89);Unsteadiness on feet (R26.81);Repeated falls (R29.6);Muscle weakness (generalized) (M62.81);History of falling (Z91.81);Other symptoms and signs involving cognitive function;Pain   Activity Tolerance Patient tolerated treatment well   Patient Left in chair;with call bell/phone within reach;with chair alarm set;Other (comment) (left with PT)   Nurse Communication Mobility status        Time: [phone] OT Time Calculation (min): 28 min  Charges: OT General Charges $OT Visit: 1 Visit OT Treatments $Self Care/Home Management : 8-22 mins $Therapeutic Activity: 8-22 mins  Alfonse Flavors,  [facility] Acute Rehabilitation Services  Office [phone]   Dewain Penning 10/05/2022, 9:02 AM

## 2022-10-05 NOTE — TOC Progression Note (Signed)
Transition of Care [facility]) - Progression Note    Patient Details  Name: Patricia Fitzpatrick MRN: [id number] Date of Birth: May 25, 1945  [facility] CM/SW Contact  Baldemar Lenis, [place] Phone Number: 10/05/2022, 1:45 PM  Clinical Narrative:   CSW received message from [facility] that she discussed with the patient, they would like to choose [facility]. [facility] has a bed available. CSW contacted PT assigned for updated note, sent request to CMA to initiate insurance authorization once PT note was entered. Insurance pending at this time. CSW to follow.    Expected Discharge Plan: Skilled Nursing Facility Barriers to Discharge: Continued Medical Work up, English as a second language teacher  Expected Discharge Plan and Services     Post Acute Care Choice: Skilled Nursing Facility Living arrangements for the past 2 months: Single Family Home                                       Social Determinants of Health (SDOH) Interventions SDOH Screenings   Food Insecurity: No Food Insecurity (09/30/2022)  Housing: Low Risk  (09/30/2022)  Transportation Needs: No Transportation Needs (09/30/2022)  Utilities: Not At Risk (09/30/2022)  Depression (PHQ2-9): High Risk (08/17/2022)  Tobacco Use: Low Risk  (10/04/2022)    Readmission Risk Interventions     No data to display

## 2022-10-05 NOTE — Progress Notes (Signed)
Patient is s/p L1 KP by Dr. Corliss Skains on 10/04/22.   Patient denies pain overnight, RN reports no overnight issues and patient is doing well, currently denies any back pain.  No routine NIR f/u needed, patient should reach out to [facility] for questions and concerns.   Post KP instructions:  - Patient should walk with a walker at all time for at least 2 weeks to prevent fall - No bending, stooping, lifting more than 10 pounds for 2 weeks - Avoid driving for 2 weeks if possible  - Do not submerge lower back (swimming/soaking in a bath tub) for 7 days  - Please call [facility] Radiology at [phone] if you have any questions or concerns regarding the kyphoplasty procedure  Please call IR for questions and concerns.   Lynann Bologna Anay Walter PA-C 10/05/2022 9:08 AM

## 2022-10-05 NOTE — Progress Notes (Signed)
PROGRESS NOTE    Patricia Fitzpatrick  NWG:[id number] DOB: [birth year] DOA: 09/29/2022 PCP: [name], MD    Brief Narrative:   Patricia Fitzpatrick is a 77 y.o. female with past medical history significant for Parkinson's disease, depression, essential hypertension and recent hospitalization with L1 lumbar vertebral compression fracture, pubic ramus fracture and closed displaced fracture greater trochanter left femur who presented to [name], ED from home on 8/14 with progressive weakness, recurrent falls and continued/increased low back pain.  Patient was recently discharged from SNF but felt that still needed aggressive rehab.  Since returning home has progressively become weaker with decreased mobilization.  Patient is not headache, no visual changes, no chest pain, no palpitations, no shortness of breath, no abdominal pain, no fever/chills/night sweats, no nausea/vomit/diarrhea, no cough/congestion, no focal weakness, no fatigue, no paresthesias.     In the ED, temperature 98.0 F, HR 64, RR 18, BP 140/74, SpO2 96% on room air.  WBC 6.2, hemoglobin 13.3, platelet count 243.  Sodium 139, potassium 4.5, CO2 27, BUN 19, creatinine 0.5, glucose 106.  AST 20, ALT 14, total bilirubin 0.5.  CT head/C-spine with no acute findings/fracture or subluxation.  CT T/L-spine with numerous thoracic/lumbar wedge fractures T5, T7, T12, L1, L3 with near plana deformities T7 and T11.  Neurosurgery was consulted and given her anatomy, osteoporosis she is not a surgical candidate as the hardware would likely fail and recommended bracing, therapy evaluation, pain management and IR consultation.  EDP consulted [facility] for admission for further evaluation and management of recurrent falls associated with increased back pain and weakness related to thoracic/lumbar compression fractures.  Assessment & Plan:   Thoracic/lumbar spine compression fracture Patient presenting to ED with continued/progressive low back pain in the setting of  recurrent falls. CT T/L-spine with numerous thoracic/lumbar wedge fractures T5, T7, T12, L1, L3 with near plana deformities T7 and T11.  MR L-spine with acute/subacute compression fracture L1 vertebral body with 40% height loss and 5 mm bony retropulsion with mild spinal stenosis, additional chronic compression fractures T9, T11, T12, L3 with 7 mm retropulsion about the T11 fracture with mild spinal stenosis, mild degenerative disc disease with mild/moderate multilevel facet hypertrophy.  Neurosurgery was consulted and given her anatomy, osteoporosis she is not a surgical candidate as the hardware would likely fail and recommended bracing, therapy evaluation, pain management and IR consultation for consideration of kyphoplasty.  Patient underwent kyphoplasty to L1 segments on 10/04/2022 by Dr. Corliss Skains. Vitamin D 25-hydroxy level 62.28, within normal limits. -- Neurosurgery, interventional radiology following, appreciate assistance -- PT/OT evaluation: Recommend SNF placement -- Pain control with Tylenol/oxycodone -- Robaxin 500 mg p.o. q8h PRN Muscle spasms -- No bending, stooping, lifting greater than 10 pounds x 2 weeks -- Continue brace while ambulating -- Fall precautions -- Pending SNF placement  Hx left hip hemiarthroplasty Patient underwent left hip hemiarthroplasty by Dr. [name] on 08/23/2020.  Recent new transverse fracture upper aspect greater trochanter left proximal femur 08/30/2022.  Evaluated by orthopedics, Dr. [name] last admission with recommendations of nonoperative management and WBAT with no active Abduction. -- Will need outpatient follow-up with Dr. [name] 2 weeks following hospitalization   Parkinson's disease -- Carbidopa/levodopa 25-100 mg 1.5 tablets 3 times daily   Essential hypertension -- Amlodipine 5 mg p.o. daily -- Hydralazine 10 mg p.o. every 8 hours as needed SBP >165 or DBP >110   Depression/anxiety -- Prozac 40 mg p.o. twice daily -- Clonazepam 0.5 mg p.o. twice  daily   Recurrent  falls Weakness/debility/deconditioning/gait disturbance: -- PT/OT recommend SNF placement -- TOC following   DVT prophylaxis: SCDs Start: 09/29/22 1824    Code Status: Full Code Family Communication: No family present at bedside this morning  Disposition Plan:  Level of care: Med-Surg Status is: Inpatient Remains inpatient appropriate because: Pending kyphoplasty, SNF placement   Consultants:  Neurosurgery, Dr. [name] Interventional radiology  Procedures:  Kyphoplasty: Pending  Antimicrobials:  None   Subjective: Patient seen and examined at bedside, lying in bed.  Patient reports feeling "panicky", did not sleep well last night.  She states did not sleep well because she was "wet" and had to be changed.  Underwent kyphoplasty yesterday.  Pain remains well-controlled.  No other questions or concerns at this time.    Denies headache, no dizziness, no chest pain, no shortness of breath, no abdominal pain, no fever/chills/night sweats, no nausea/vomiting/diarrhea, no focal weakness, no fatigue, no paresthesias.  No acute events overnight per nurse staff.  Medically stable for discharge to SNF once bed available.  Objective: Vitals:   10/04/22 1510 10/04/22 1947 10/05/22 0411 10/05/22 0733  BP: 101/63 110/62 130/74 129/74  Pulse: 61 67 62 60  Resp: 20 18 18 20   Temp: 98.3 F (36.8 C) 98.4 F (36.9 C) 98.2 F (36.8 C) 98.1 F (36.7 C)  TempSrc: Oral Oral Oral Oral  SpO2: 95% 97% 100% 98%  Weight:      Height:        Intake/Output Summary (Last 24 hours) at 10/05/2022 0934 Last data filed at 10/05/2022 0646 Gross per 24 hour  Intake 236 ml  Output 1300 ml  Net -1064 ml   Filed Weights   09/29/22 1123 09/29/22 1819  Weight: 46.3 kg 44.6 kg    Examination:  Physical Exam: GEN: NAD, alert and oriented x 3, chronically ill/thin in appearance, mild tremors upper extremities at rest HEENT: NCAT, PERRL, EOMI, sclera clear, MMM PULM: CTAB w/o  wheezes/crackles, normal respiratory effort CV: RRR w/o M/G/R GI: abd soft, NTND, NABS, no R/G/M MSK: no peripheral edema, muscle strength globally intact 5/5 bilateral upper/lower extremities NEURO: CN II-XII intact, slight resting tremor, otherwise no focal deficits, sensation to light touch intact PSYCH: normal mood/flat affect Integumentary: dry/intact, no rashes or wounds    Data Reviewed: I have personally reviewed following labs and imaging studies  CBC: Recent Labs  Lab 09/29/22 1157 10/04/22 0828  WBC 6.2 6.9  NEUTROABS 5.0  --   HGB 13.3 12.3  HCT 40.6 38.5  MCV 95.3 96.0  PLT 263 263   Basic Metabolic Panel: Recent Labs  Lab 09/29/22 1157 10/04/22 0828  NA 139 137  K 4.5 4.0  CL 102 103  CO2 27 24  GLUCOSE 106* 84  BUN 19 20  CREATININE 0.85 0.78  CALCIUM 9.0 8.4*  MG 2.0  --    GFR: Estimated Creatinine Clearance: 42.1 mL/min (by C-G formula based on SCr of 0.78 mg/dL). Liver Function Tests: Recent Labs  Lab 09/29/22 1157  AST 20  ALT 14  ALKPHOS 67  BILITOT 0.5  PROT 6.3*  ALBUMIN 3.3*   No results for input(s): "LIPASE", "AMYLASE" in the last 168 hours. No results for input(s): "AMMONIA" in the last 168 hours. Coagulation Profile: Recent Labs  Lab 10/01/22 0908 10/04/22 0828  INR 1.0 1.0   Cardiac Enzymes: No results for input(s): "CKTOTAL", "CKMB", "CKMBINDEX", "TROPONINI" in the last 168 hours. BNP (last 3 results) No results for input(s): "PROBNP" in the last 8760 hours. HbA1C: No  results for input(s): "HGBA1C" in the last 72 hours. CBG: No results for input(s): "GLUCAP" in the last 168 hours. Lipid Profile: No results for input(s): "CHOL", "HDL", "LDLCALC", "TRIG", "CHOLHDL", "LDLDIRECT" in the last 72 hours. Thyroid Function Tests: No results for input(s): "TSH", "T4TOTAL", "FREET4", "T3FREE", "THYROIDAB" in the last 72 hours. Anemia Panel: No results for input(s): "VITAMINB12", "FOLATE", "FERRITIN", "TIBC", "IRON",  "RETICCTPCT" in the last 72 hours. Sepsis Labs: No results for input(s): "PROCALCITON", "LATICACIDVEN" in the last 168 hours.  No results found for this or any previous visit (from the past 240 hour(s)).       Radiology Studies: No results found.      Scheduled Meds:  amLODipine  5 mg Oral Daily   carbidopa-levodopa  1.5 tablet Oral TID   clonazepam  0.25 mg Oral q AM   And   clonazePAM  0.5 mg Oral QHS   feeding supplement  237 mL Oral BID BM   FLUoxetine  40 mg Oral BID   Continuous Infusions:     LOS: 4 days    Time spent: 43 minutes spent on chart review, discussion with nursing staff, consultants, updating family and interview/physical exam; more than 50% of that time was spent in counseling and/or coordination of care.    Alvira Philips [place], DO [facility] Available via Epic secure chat 7am-7pm After these hours, please refer to coverage provider listed on [email or web address] 10/05/2022, 9:34 AM

## 2022-10-06 DIAGNOSIS — S32592D Other specified fracture of left pubis, subsequent encounter for fracture with routine healing: Secondary | ICD-10-CM

## 2022-10-06 DIAGNOSIS — R278 Other lack of coordination: Secondary | ICD-10-CM | POA: Diagnosis not present

## 2022-10-06 DIAGNOSIS — R2689 Other abnormalities of gait and mobility: Secondary | ICD-10-CM | POA: Diagnosis not present

## 2022-10-06 DIAGNOSIS — I10 Essential (primary) hypertension: Secondary | ICD-10-CM | POA: Diagnosis not present

## 2022-10-06 DIAGNOSIS — R8290 Unspecified abnormal findings in urine: Secondary | ICD-10-CM | POA: Diagnosis not present

## 2022-10-06 DIAGNOSIS — F419 Anxiety disorder, unspecified: Secondary | ICD-10-CM | POA: Diagnosis not present

## 2022-10-06 DIAGNOSIS — F331 Major depressive disorder, recurrent, moderate: Secondary | ICD-10-CM | POA: Diagnosis not present

## 2022-10-06 DIAGNOSIS — S32000A Wedge compression fracture of unspecified lumbar vertebra, initial encounter for closed fracture: Secondary | ICD-10-CM | POA: Diagnosis not present

## 2022-10-06 DIAGNOSIS — R41841 Cognitive communication deficit: Secondary | ICD-10-CM | POA: Diagnosis not present

## 2022-10-06 DIAGNOSIS — Z7401 Bed confinement status: Secondary | ICD-10-CM | POA: Diagnosis not present

## 2022-10-06 DIAGNOSIS — E860 Dehydration: Secondary | ICD-10-CM | POA: Diagnosis not present

## 2022-10-06 DIAGNOSIS — G20A1 Parkinson's disease without dyskinesia, without mention of fluctuations: Secondary | ICD-10-CM | POA: Diagnosis not present

## 2022-10-06 DIAGNOSIS — E785 Hyperlipidemia, unspecified: Secondary | ICD-10-CM | POA: Diagnosis not present

## 2022-10-06 DIAGNOSIS — F329 Major depressive disorder, single episode, unspecified: Secondary | ICD-10-CM | POA: Diagnosis not present

## 2022-10-06 DIAGNOSIS — R1312 Dysphagia, oropharyngeal phase: Secondary | ICD-10-CM | POA: Diagnosis not present

## 2022-10-06 DIAGNOSIS — S32512D Fracture of superior rim of left pubis, subsequent encounter for fracture with routine healing: Secondary | ICD-10-CM | POA: Diagnosis not present

## 2022-10-06 DIAGNOSIS — R4701 Aphasia: Secondary | ICD-10-CM | POA: Diagnosis not present

## 2022-10-06 DIAGNOSIS — F411 Generalized anxiety disorder: Secondary | ICD-10-CM | POA: Diagnosis not present

## 2022-10-06 DIAGNOSIS — R1311 Dysphagia, oral phase: Secondary | ICD-10-CM | POA: Diagnosis not present

## 2022-10-06 DIAGNOSIS — S32000D Wedge compression fracture of unspecified lumbar vertebra, subsequent encounter for fracture with routine healing: Secondary | ICD-10-CM | POA: Diagnosis not present

## 2022-10-06 DIAGNOSIS — F060 Psychotic disorder with hallucinations due to known physiological condition: Secondary | ICD-10-CM | POA: Diagnosis not present

## 2022-10-06 DIAGNOSIS — G20B1 Parkinson's disease with dyskinesia, without mention of fluctuations: Secondary | ICD-10-CM | POA: Diagnosis not present

## 2022-10-06 DIAGNOSIS — I159 Secondary hypertension, unspecified: Secondary | ICD-10-CM | POA: Diagnosis not present

## 2022-10-06 DIAGNOSIS — R531 Weakness: Secondary | ICD-10-CM | POA: Diagnosis not present

## 2022-10-06 DIAGNOSIS — Z4889 Encounter for other specified surgical aftercare: Secondary | ICD-10-CM | POA: Diagnosis not present

## 2022-10-06 DIAGNOSIS — M6281 Muscle weakness (generalized): Secondary | ICD-10-CM | POA: Diagnosis not present

## 2022-10-06 DIAGNOSIS — F32A Depression, unspecified: Secondary | ICD-10-CM | POA: Diagnosis not present

## 2022-10-06 DIAGNOSIS — S329XXD Fracture of unspecified parts of lumbosacral spine and pelvis, subsequent encounter for fracture with routine healing: Secondary | ICD-10-CM | POA: Diagnosis not present

## 2022-10-06 DIAGNOSIS — R296 Repeated falls: Secondary | ICD-10-CM | POA: Diagnosis not present

## 2022-10-06 DIAGNOSIS — N390 Urinary tract infection, site not specified: Secondary | ICD-10-CM | POA: Diagnosis not present

## 2022-10-06 LAB — CBC
HCT: 37 % (ref 36.0–46.0)
Hemoglobin: 12.3 g/dL (ref 12.0–15.0)
MCH: 31.3 pg (ref 26.0–34.0)
MCHC: 33.2 g/dL (ref 30.0–36.0)
MCV: 94.1 fL (ref 80.0–100.0)
Platelets: 278 10*3/uL (ref 150–400)
RBC: 3.93 MIL/uL (ref 3.87–5.11)
RDW: 12.7 % (ref 11.5–15.5)
WBC: 6.8 10*3/uL (ref 4.0–10.5)
nRBC: 0 % (ref 0.0–0.2)

## 2022-10-06 LAB — BASIC METABOLIC PANEL
Anion gap: 11 (ref 5–15)
BUN: 23 mg/dL (ref 8–23)
CO2: 27 mmol/L (ref 22–32)
Calcium: 9.1 mg/dL (ref 8.9–10.3)
Chloride: 100 mmol/L (ref 98–111)
Creatinine, Ser: 0.82 mg/dL (ref 0.44–1.00)
GFR, Estimated: >60 mL/min (ref >60)
Glucose, Bld: 95 mg/dL (ref 70–99)
Potassium: 4.4 mmol/L (ref 3.5–5.1)
Sodium: 138 mmol/L (ref 135–145)

## 2022-10-06 MED ORDER — CLONAZEPAM 0.5 MG PO TABS: 0.5000 mg | ORAL_TABLET | Freq: Two times a day (BID) | ORAL | 0 refills | Status: DC

## 2022-10-06 MED ORDER — ENSURE ENLIVE PO LIQD: 237.0000 mL | Freq: Two times a day (BID) | ORAL | Status: DC

## 2022-10-06 MED ORDER — OXYCODONE HCL 5 MG PO TABS: 5.0000 mg | ORAL_TABLET | ORAL | 0 refills | Status: DC | PRN

## 2022-10-06 MED ORDER — ONDANSETRON HCL 4 MG PO TABS: 4.0000 mg | ORAL_TABLET | Freq: Four times a day (QID) | ORAL | Status: DC | PRN

## 2022-10-06 NOTE — Consult Note (Signed)
[facility]) Accountable Care Organization [facility]) [facility] Liaison Note  10/06/2022  Patricia Fitzpatrick [birth year] [id number]  Location: [facility] Liaison screened the patient remotely at [facility].  Insurance: [facility] HMO   Patricia Fitzpatrick is a 77 y.o. female who is a Primary Care Patient of [name], MD. The patient was screened for 30 day readmission hospitalization with noted medium risk score for unplanned readmission risk with 2 IP/1 ED in 6 months.  The patient was assessed for potential [facility]) Care Management service needs for post hospital transition for care coordination. Review of patient's electronic medical record reveals patient was admitted for lumbar compression closed fracture.   Plan: Pt will discharge to a SNF level of care and continue to be managed by the facility for his needs.   [facility] Care Management/Population Health does not replace or interfere with any arrangements made by the Inpatient Transition of Care team.   For questions contact:   Elliot Cousin, RN, [facility] Liaison [place]   [facility]), Population Health [phone] mobile [email or web address]

## 2022-10-06 NOTE — Progress Notes (Signed)
Patient discharged to [facility], transported by [facility]

## 2022-10-06 NOTE — Discharge Summary (Signed)
Physician Discharge Summary  SERENE BASTOW EAV:[id number] DOB: May 26, 1945 DOA: 09/29/2022  PCP: [name], MD  Admit date: 09/29/2022 Discharge date: 10/06/2022  Admitted From: Home  Discharge disposition: Skilled nursing facility  Recommendations for Outpatient Follow-Up:   Follow up with your primary care provider in one week.  Check CBC, BMP, magnesium in the next visit Follow-up with orthopedics Dr [name] as outpatient in 2 weeks. No bending, stooping, lifting greater than 10 pounds x 2 weeks.   WBAT with no active Abduction of the left hip.   Discharge Diagnosis:   Principal Problem:   Lumbar compression fracture, closed, initial encounter [facility]) Active Problems:   Pubic ramus fracture, left, with routine healing, subsequent encounter   Parkinson's disease   Hypertension   Discharge Condition: Improved.  Diet recommendation: Regular.  Wound care: None.  Code status: Full.   History of Present Illness:   Patricia Fitzpatrick is a 77 y.o. female with past medical history significant for Parkinson's disease, depression, essential hypertension and recent hospitalization with L1 lumbar vertebral,pubic ramus  and closed displaced fracture  of greater trochanter left femur  presented to [name] ED from home on 8/14 with progressive weakness, recurrent falls and continued/increased low back pain.  Patient was recently discharged from SNF but felt that still needed aggressive rehab.  Since returning home has progressively become weaker with decreased mobilization.  In the ED, patient had stable vitals.  CBC and BMP unremarkable.    CT head/C-spine with no acute findings/fracture or subluxation.  CT T/L-spine with numerous thoracic/lumbar wedge fractures T5, T7, T12, L1, L3 with near plana deformities T7 and T11.  Neurosurgery was consulted and patient was admitted hospital for further evaluation and treatment.   Hospital Course:   Following conditions were addressed during  hospitalization as listed below,  Thoracic/lumbar spine compression fracture Presented with progressive back pain.   CT T/L-spine with numerous thoracic/lumbar wedge fractures T5, T7, T12, L1, L3 with near plana deformities T7 and T11.   Neurosurgery was consulted and given her anatomy, osteoporosis she is not a surgical candidate as the hardware would likely fail and recommended bracing, therapy evaluation, pain management and IR consultation for consideration of kyphoplasty.  Patient underwent kyphoplasty to L1 segments on 10/04/2022 by Dr. Corliss Skains. Vitamin D 25-hydroxy level 62.28, within normal limits.  Continue pain control with Tylenol/oxycodone, Robaxin. No bending, stooping, lifting greater than 10 pounds x 2 weeks.  Continue brace during ambulation. PT OT rehab recommended skilled nursing facility placement.     Hx left hip hemiarthroplasty Patient underwent left hip hemiarthroplasty by Dr. [name] on 08/23/2020.  Recent new transverse fracture upper aspect greater trochanter left proximal femur 08/30/2022.  Evaluated by orthopedics, Dr. [name] last admission with recommendations of nonoperative management and WBAT with no active Abduction. Will need outpatient follow-up with Dr. [name] 2 weeks following hospitalization   Parkinson's disease Continue carbidopa/levodopa    Essential hypertension Continue amlodipine    Depression/anxiety Continue Prozac and Klonopin   Recurrent falls Weakness/debility/deconditioning/gait disturbance: For skilled nursing facility placement.  Disposition.  At this time, patient is stable for disposition to skilled nursing facility with outpatient orthopedic follow-up.  Medical Consultants:   Neurosurgery Interventional radiology  Procedures:    Kyphoplasty Subjective:   Today, patient was seen and examined at bedside.  Complains of generalized weakness.  Denies any overt pain.  Discharge Exam:   Vitals:   10/06/22 0329 10/06/22 0734  BP:  117/64 131/73  Pulse: 63 65  Resp:  16 17  Temp: 97.6 F (36.4 C) 97.7 F (36.5 C)  SpO2: 99% 97%   Vitals:   10/05/22 2008 10/05/22 2338 10/06/22 0329 10/06/22 0734  BP: 133/79 123/72 117/64 131/73  Pulse: 66 62 63 65  Resp: 16 16 16 17   Temp: 98.5 F (36.9 C) 98 F (36.7 C) 97.6 F (36.4 C) 97.7 F (36.5 C)  TempSrc: Oral Oral Oral Oral  SpO2: 100% 98% 99% 97%  Weight:      Height:       Body mass index is 17.98 kg/m.   General: Alert awake, not in obvious distress, thinly built, Communicative, appears chronically ill, tremors at rest on upper extremities HENT: pupils equally reacting to light,  No scleral pallor or icterus noted. Oral mucosa is moist.  Chest:   Diminished breath sounds bilaterally. No crackles or wheezes.  CVS: S1 &S2 heard. No murmur.  Regular rate and rhythm. Abdomen: Soft, nontender, nondistended.  Bowel sounds are heard.   Extremities: No cyanosis, clubbing or edema.  Peripheral pulses are palpable. Psych: Alert, awake and oriented, normal mood CNS:  No cranial nerve deficits.  Moves all extremities, mild tremors at rest.. Skin: Warm and dry.  No rashes noted.  The results of significant diagnostics from this hospitalization (including imaging, microbiology, ancillary and laboratory) are listed below for reference.     Diagnostic Studies:   MR LUMBAR SPINE WO CONTRAST  Result Date: 10/01/2022 CLINICAL DATA:  Initial evaluation for compression fracture. EXAM: MRI LUMBAR SPINE WITHOUT CONTRAST TECHNIQUE: Multiplanar, multisequence MR imaging of the lumbar spine was performed. No intravenous contrast was administered. COMPARISON:  Comparison made with prior CT from 09/29/2022. FINDINGS: Segmentation: Standard. Lowest well-formed disc space labeled the L5-S1 level. Alignment: Mild exaggeration of the normal lumbar lordosis. Focal kyphotic angulation at the level of T11 related to a compression fracture. No significant listhesis. Vertebrae: Acute to  subacute compression fracture involving the L1 vertebral body is seen. Associated height loss measures up to 40% with 5 mm bony retropulsion. This is benign/mechanical in appearance. Mild chronic compression deformity of T9 with mild 25% height loss and trace bony retropulsion. Severe chronic compression deformity of T11 with vertebral plana formation and 7 mm bony retropulsion. Chronic compression deformity of T12 with up to 70% height loss and 3 mm bony retropulsion. Chronic compression fracture of L3 with 30% height loss and no more than trace 2 mm bony retropulsion. Otherwise, vertebral body height maintained. Underlying bone marrow signal intensity within normal limits. Few scattered benign hemangiomata noted. No worrisome osseous lesions. No other abnormal marrow edema. Conus medullaris and cauda equina: Conus extends to the L2 level. Distal aspect of the cord is draped over the retropulsed chronic T11 fracture. Conus otherwise within normal limits. Nerve roots of the cauda equina within normal limits. Paraspinal and other soft tissues: Paraspinous soft tissues demonstrate no acute finding. Few scattered simple cyst noted about the visualized kidneys, benign in appearance, no follow-up imaging recommended. Disc levels: T10-11: 7 mm bony retropulsion related to the chronic T11 fracture. Retropulsed bone indents the ventral thecal sac. Secondary mild cord flattening without cord signal changes. Mild facet spurring. Resultant mild spinal stenosis. Mild left foraminal narrowing. Right neural foramen remains patent. T11-12: 3 mm bony retropulsion related to the chronic T12 fracture. Mild facet hypertrophy. No significant spinal stenosis. Foramina remain patent. T12-L1: 5 mm bony retropulsion related to the acute to subacute L1 fracture. Flattening of the ventral thecal sac with resultant mild spinal stenosis. Foramina remain  patent. L1-2:  Negative interspace.  Mild facet hypertrophy.  No stenosis. L2-3: Trace bony  retropulsion related to the chronic L3 fracture. Superimposed small left foraminal disc protrusion. Mild facet hypertrophy. No significant spinal stenosis. Foramina remain patent. L3-4: Disc desiccation. Small left foraminal disc protrusion is seen. Mild facet hypertrophy. No spinal stenosis. Foramina remain adequately patent. L4-5: Disc desiccation. Small left foraminal disc protrusion with annular fissure. Moderate facet hypertrophy. No spinal stenosis. Foramina remain adequately patent. L5-S1: Minimal disc bulge. Moderate right with mild left facet hypertrophy. No spinal stenosis. Foramina remain patent. IMPRESSION: 1. Acute to subacute compression fracture involving the L1 vertebral body with up to 40% height loss and 5 mm bony retropulsion. Resultant mild spinal stenosis at this level. 2. Additional chronic compression fractures of T9, T11, T12, and L3 as detailed above. 7 mm retropulsion about the T11 fracture with mild spinal stenosis. 3. Underlying mild for age degenerative disc disease with mild-to-moderate multilevel facet hypertrophy. No other significant stenosis. Electronically Signed   By: Rise Mu M.D.   On: 10/01/2022 05:47   DG HIP UNILAT WITH PELVIS 2-3 VIEWS LEFT  Result Date: 09/30/2022 CLINICAL DATA:  Left hip pain.  Previous fall. EXAM: DG HIP (WITH OR WITHOUT PELVIS) 2-3V LEFT COMPARISON:  06/02/2022 FINDINGS: Postoperative change from left hip arthroplasty. No signs of acute periprosthetic fracture or dislocation. Again seen are chronic fracture deformities involving the left superior and inferior pubic rami. Remote parasymphyseal right pubic bone fracture is also again seen in appears unchanged. IMPRESSION: 1. No acute findings. 2. Left hip arthroplasty. 3. Chronic fracture deformities involving the left superior and inferior pubic rami and right parasymphyseal pubic bone. Electronically Signed   By: Signa Kell M.D.   On: 09/30/2022 16:42   CT Lumbar Spine Wo  Contrast  Result Date: 09/29/2022 CLINICAL DATA:  Trauma, fall, pain, history of compression fractures EXAM: CT THORACIC AND LUMBAR SPINE WITHOUT CONTRAST TECHNIQUE: Multidetector CT imaging of the thoracic and lumbar spine was performed without intravenous contrast administration. Multiplanar CT image reconstructions were also generated. RADIATION DOSE REDUCTION: This exam was performed according to the departmental dose-optimization program which includes automated exposure control, adjustment of the mA and/or kV according to patient size and/or use of iterative reconstruction technique. COMPARISON:  08/30/2022 FINDINGS: Alignment: Slightly exaggerated thoracic kyphosis secondary to multiple wedge deformities. Normal lumbar lordosis. Vertebral bodies: Osteopenia. Numerous thoracic and lumbar wedge fractures, including of T5, T7, T11, T12, L1, and L3, particularly notable for near vertebral plana deformities at T7 and T11. There is increased, moderate height loss of comminuted fracture seen at L1, which was probably subacute at the time of imaging dated 08/30/2022, with other fracture deformities unchanged. No new fracture of the thoracic or lumbar spine. Disc spaces: Minimal disc space height loss and endplate osteophytosis throughout the thoracic and lumbar spine. Paraspinous soft tissues: Small bilateral pleural effusions. Coronary artery disease. Aortic atherosclerosis. IMPRESSION: 1. Numerous thoracic and lumbar wedge fractures, including of T5, T7, T11, T12, L1, and L3, particularly notable for near vertebral plana deformities at T7 and T11. 2. There is increased, moderate height loss of comminuted fracture seen at L1, which was probably subacute at the time of imaging dated 08/30/2022, with other fracture deformities unchanged. 3. No new fracture of the thoracic or lumbar spine. 4. Small bilateral pleural effusions. 5. Coronary artery disease. Aortic Atherosclerosis (ICD10-I70.0). Electronically Signed    By: Jearld Lesch M.D.   On: 09/29/2022 15:40   CT Thoracic Spine Wo Contrast  Result Date: 09/29/2022 CLINICAL DATA:  Trauma, fall, pain, history of compression fractures EXAM: CT THORACIC AND LUMBAR SPINE WITHOUT CONTRAST TECHNIQUE: Multidetector CT imaging of the thoracic and lumbar spine was performed without intravenous contrast administration. Multiplanar CT image reconstructions were also generated. RADIATION DOSE REDUCTION: This exam was performed according to the departmental dose-optimization program which includes automated exposure control, adjustment of the mA and/or kV according to patient size and/or use of iterative reconstruction technique. COMPARISON:  08/30/2022 FINDINGS: Alignment: Slightly exaggerated thoracic kyphosis secondary to multiple wedge deformities. Normal lumbar lordosis. Vertebral bodies: Osteopenia. Numerous thoracic and lumbar wedge fractures, including of T5, T7, T11, T12, L1, and L3, particularly notable for near vertebral plana deformities at T7 and T11. There is increased, moderate height loss of comminuted fracture seen at L1, which was probably subacute at the time of imaging dated 08/30/2022, with other fracture deformities unchanged. No new fracture of the thoracic or lumbar spine. Disc spaces: Minimal disc space height loss and endplate osteophytosis throughout the thoracic and lumbar spine. Paraspinous soft tissues: Small bilateral pleural effusions. Coronary artery disease. Aortic atherosclerosis. IMPRESSION: 1. Numerous thoracic and lumbar wedge fractures, including of T5, T7, T11, T12, L1, and L3, particularly notable for near vertebral plana deformities at T7 and T11. 2. There is increased, moderate height loss of comminuted fracture seen at L1, which was probably subacute at the time of imaging dated 08/30/2022, with other fracture deformities unchanged. 3. No new fracture of the thoracic or lumbar spine. 4. Small bilateral pleural effusions. 5. Coronary artery  disease. Aortic Atherosclerosis (ICD10-I70.0). Electronically Signed   By: Jearld Lesch M.D.   On: 09/29/2022 15:40   CT Cervical Spine Wo Contrast  Result Date: 09/29/2022 CLINICAL DATA:  Fall, pain, back injury, spine fracture EXAM: CT HEAD WITHOUT CONTRAST CT CERVICAL SPINE WITHOUT CONTRAST TECHNIQUE: Multidetector CT imaging of the head and cervical spine was performed following the standard protocol without intravenous contrast. Multiplanar CT image reconstructions of the cervical spine were also generated. RADIATION DOSE REDUCTION: This exam was performed according to the departmental dose-optimization program which includes automated exposure control, adjustment of the mA and/or kV according to patient size and/or use of iterative reconstruction technique. COMPARISON:  08/31/2022 FINDINGS: CT HEAD FINDINGS Brain: No evidence of acute infarction, hemorrhage, hydrocephalus, extra-axial collection or mass lesion/mass effect. Unchanged focal hypodensity left frontal corona radiata (series 2, 20). Periventricular white matter hypodensity. Vascular: No hyperdense vessel or unexpected calcification. Skull: Normal. Negative for fracture or focal lesion. Sinuses/Orbits: No acute finding. Other: None. CT CERVICAL SPINE FINDINGS Alignment: Normal. Skull base and vertebrae: No acute fracture. No primary bone lesion or focal pathologic process. Soft tissues and spinal canal: No prevertebral fluid or swelling. No visible canal hematoma. Disc levels: Moderate disc space height loss and osteophytosis of the lower cervical levels. Upper chest: Negative. Other: None. IMPRESSION: 1. No acute intracranial pathology. Small-vessel white matter disease and unchanged focal hypodensity left frontal corona radiata, keeping with chronic infarction. 2. No fracture or static subluxation of the cervical spine. 3. 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 Electronically Signed   By: Jearld Lesch M.D.   On:  09/29/2022 15:34   CT Head Wo Contrast  Result Date: 09/29/2022 CLINICAL DATA:  Fall, pain, back injury, spine fracture EXAM: CT HEAD WITHOUT CONTRAST CT CERVICAL SPINE WITHOUT CONTRAST TECHNIQUE: Multidetector CT imaging of the head and cervical spine was performed following the standard protocol without intravenous contrast. Multiplanar CT image reconstructions of the cervical spine  were also generated. RADIATION DOSE REDUCTION: This exam was performed according to the departmental dose-optimization program which includes automated exposure control, adjustment of the mA and/or kV according to patient size and/or use of iterative reconstruction technique. COMPARISON:  08/31/2022 FINDINGS: CT HEAD FINDINGS Brain: No evidence of acute infarction, hemorrhage, hydrocephalus, extra-axial collection or mass lesion/mass effect. Unchanged focal hypodensity left frontal corona radiata (series 2, 20). Periventricular white matter hypodensity. Vascular: No hyperdense vessel or unexpected calcification. Skull: Normal. Negative for fracture or focal lesion. Sinuses/Orbits: No acute finding. Other: None. CT CERVICAL SPINE FINDINGS Alignment: Normal. Skull base and vertebrae: No acute fracture. No primary bone lesion or focal pathologic process. Soft tissues and spinal canal: No prevertebral fluid or swelling. No visible canal hematoma. Disc levels: Moderate disc space height loss and osteophytosis of the lower cervical levels. Upper chest: Negative. Other: None. IMPRESSION: 1. No acute intracranial pathology. Small-vessel white matter disease and unchanged focal hypodensity left frontal corona radiata, keeping with chronic infarction. 2. No fracture or static subluxation of the cervical spine. 3. 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 Electronically Signed   By: Jearld Lesch M.D.   On: 09/29/2022 15:34     Labs:   Basic Metabolic Panel: Recent Labs  Lab 09/29/22 1157 10/04/22 0828  10/06/22 0547  NA 139 137 138  K 4.5 4.0 4.4  CL 102 103 100  CO2 27 24 27   GLUCOSE 106* 84 95  BUN 19 20 23   CREATININE 0.85 0.78 0.82  CALCIUM 9.0 8.4* 9.1  MG 2.0  --   --    GFR Estimated Creatinine Clearance: 41.1 mL/min (by C-G formula based on SCr of 0.82 mg/dL). Liver Function Tests: Recent Labs  Lab 09/29/22 1157  AST 20  ALT 14  ALKPHOS 67  BILITOT 0.5  PROT 6.3*  ALBUMIN 3.3*   No results for input(s): "LIPASE", "AMYLASE" in the last 168 hours. No results for input(s): "AMMONIA" in the last 168 hours. Coagulation profile Recent Labs  Lab 10/01/22 0908 10/04/22 0828  INR 1.0 1.0    CBC: Recent Labs  Lab 09/29/22 1157 10/04/22 0828 10/06/22 0547  WBC 6.2 6.9 6.8  NEUTROABS 5.0  --   --   HGB 13.3 12.3 12.3  HCT 40.6 38.5 37.0  MCV 95.3 96.0 94.1  PLT 263 263 278   Cardiac Enzymes: No results for input(s): "CKTOTAL", "CKMB", "CKMBINDEX", "TROPONINI" in the last 168 hours. BNP: Invalid input(s): "POCBNP" CBG: No results for input(s): "GLUCAP" in the last 168 hours. D-Dimer No results for input(s): "DDIMER" in the last 72 hours. Hgb A1c No results for input(s): "HGBA1C" in the last 72 hours. Lipid Profile No results for input(s): "CHOL", "HDL", "LDLCALC", "TRIG", "CHOLHDL", "LDLDIRECT" in the last 72 hours. Thyroid function studies No results for input(s): "TSH", "T4TOTAL", "T3FREE", "THYROIDAB" in the last 72 hours.  Invalid input(s): "FREET3" Anemia work up No results for input(s): "VITAMINB12", "FOLATE", "FERRITIN", "TIBC", "IRON", "RETICCTPCT" in the last 72 hours. Microbiology No results found for this or any previous visit (from the past 240 hour(s)).   Discharge Instructions:   Discharge Instructions     Diet general   Complete by: As directed    Discharge instructions   Complete by: As directed    Follow-up with your primary care provider at the skilled nursing facility in 3 to 5 days.  Check blood work at that time.   Continue physical therapy.  Use brace during mobility.No bending, stooping, lifting greater than 10  pounds x 2 weeks.   Increase activity slowly   Complete by: As directed    No wound care   Complete by: As directed       Allergies as of 10/06/2022   No Known Allergies      Medication List     STOP taking these medications    ascorbic acid 500 MG tablet Commonly known as: VITAMIN C   QUEtiapine 25 MG tablet Commonly known as: SEROQUEL   thiamine 100 MG tablet Commonly known as: VITAMIN B1       TAKE these medications    acetaminophen 500 MG tablet Commonly known as: TYLENOL Take 2 tablets (1,000 mg total) by mouth 3 (three) times daily.   amLODipine 5 MG tablet Commonly known as: NORVASC Take 1 tablet (5 mg total) by mouth daily.   B COMPLEX PO Take 1 tablet by mouth daily.   carbidopa-levodopa 25-100 MG tablet Commonly known as: SINEMET IR Take 1.5 tablets by mouth 3 (three) times daily.   clonazePAM 0.5 MG tablet Commonly known as: KLONOPIN Take 1 tablet (0.5 mg total) by mouth 2 (two) times daily. 1/2 tab in the morning, 1 tab at bedtime daily   docusate sodium 100 MG capsule Commonly known as: COLACE Take 1 capsule (100 mg total) by mouth 2 (two) times daily.   enoxaparin 30 MG/0.3ML injection Commonly known as: LOVENOX Inject 0.3 mLs (30 mg total) into the skin daily.   feeding supplement Liqd Take 237 mLs by mouth 2 (two) times daily between meals.   FLUoxetine 40 MG capsule Commonly known as: PROZAC Take 40 mg by mouth 2 (two) times daily.   hydrALAZINE 25 MG tablet Commonly known as: APRESOLINE Take 1 tablet (25 mg total) by mouth every 6 (six) hours as needed (For SBP greater than 170 mmHg).   methocarbamol 500 MG tablet Commonly known as: ROBAXIN Take 1 tablet (500 mg total) by mouth every 8 (eight) hours as needed for muscle spasms.   ondansetron 4 MG tablet Commonly known as: ZOFRAN Take 1 tablet (4 mg total) by mouth every 6  (six) hours as needed for nausea.   oxyCODONE 5 MG immediate release tablet Commonly known as: Oxy IR/ROXICODONE Take 1 tablet (5 mg total) by mouth every 4 (four) hours as needed for severe pain.   polyethylene glycol 17 g packet Commonly known as: MIRALAX / GLYCOLAX Take 17 g by mouth daily.   Prolia 60 MG/ML Sosy injection Generic drug: denosumab Inject 60 mg into the skin every 6 (six) months.   VITAMIN D3 PO Take 50 mcg by mouth daily.        Follow-up Information     [name], MD. Schedule an appointment as soon as possible for a visit in 1 week(s).   Specialty: Internal Medicine Contact information: [address] [place] [address] [phone]         Bjorn Pippin, MD. Schedule an appointment as soon as possible for a visit in 2 week(s).   Specialty: Orthopedic Surgery Contact information: [address] [place] [address] [phone]                  Time coordinating discharge: 39 minutes  Signed:  Ailsa Mireles  [facility] 10/06/2022, 10:44 AM

## 2022-10-06 NOTE — TOC Transition Note (Signed)
Transition of Care [facility]) - CM/SW Discharge Note   Patient Details  Name: Patricia Fitzpatrick MRN: [id number] Date of Birth: 1945/09/13  [facility] CM/SW Contact:  Baldemar Lenis, LCSW Phone Number: 10/06/2022, 11:09 AM   Clinical Narrative:   Patient received insurance approval to admit to [facility], and [facility] has bed available. CSW sent discharge summary to [facility], confirmed receipt. CSW spoke with stepdaughter, [name], who is in agreement. Transport arranged with [facility] for next available.  Nurse to call report to [phone] Room 610A.    Final next level of care: Skilled Nursing Facility Barriers to Discharge: Barriers Resolved   Patient Goals and CMS Choice CMS Medicare.gov Compare Post Acute Care list provided to:: Patient Choice offered to / list presented to : Patient, Adult Children  Discharge Placement                Patient chooses bed at: [facility] Patient to be transferred to facility by: [facility] Name of family member notified: [name] Patient and family notified of of transfer: 10/06/22  Discharge Plan and Services Additional resources added to the After Visit Summary for       Post Acute Care Choice: Skilled Nursing Facility                               Social Determinants of Health (SDOH) Interventions SDOH Screenings   Food Insecurity: No Food Insecurity (09/30/2022)  Housing: Low Risk  (09/30/2022)  Transportation Needs: No Transportation Needs (09/30/2022)  Utilities: Not At Risk (09/30/2022)  Depression (PHQ2-9): High Risk (08/17/2022)  Tobacco Use: Low Risk  (10/04/2022)     Readmission Risk Interventions     No data to display

## 2022-10-07 DIAGNOSIS — I10 Essential (primary) hypertension: Secondary | ICD-10-CM | POA: Diagnosis not present

## 2022-10-07 DIAGNOSIS — R296 Repeated falls: Secondary | ICD-10-CM | POA: Diagnosis not present

## 2022-10-07 DIAGNOSIS — S329XXD Fracture of unspecified parts of lumbosacral spine and pelvis, subsequent encounter for fracture with routine healing: Secondary | ICD-10-CM | POA: Diagnosis not present

## 2022-10-07 DIAGNOSIS — R531 Weakness: Secondary | ICD-10-CM | POA: Diagnosis not present

## 2022-10-07 NOTE — Telephone Encounter (Signed)
LVM to [facility]. Patient now in SNF.

## 2022-10-08 DIAGNOSIS — G20A1 Parkinson's disease without dyskinesia, without mention of fluctuations: Secondary | ICD-10-CM | POA: Diagnosis not present

## 2022-10-08 DIAGNOSIS — E860 Dehydration: Secondary | ICD-10-CM | POA: Diagnosis not present

## 2022-10-08 DIAGNOSIS — I10 Essential (primary) hypertension: Secondary | ICD-10-CM | POA: Diagnosis not present

## 2022-10-08 DIAGNOSIS — F32A Depression, unspecified: Secondary | ICD-10-CM | POA: Diagnosis not present

## 2022-10-08 DIAGNOSIS — R2689 Other abnormalities of gait and mobility: Secondary | ICD-10-CM

## 2022-10-08 DIAGNOSIS — M6281 Muscle weakness (generalized): Secondary | ICD-10-CM | POA: Diagnosis not present

## 2022-10-08 DIAGNOSIS — R251 Tremor, unspecified: Secondary | ICD-10-CM | POA: Diagnosis not present

## 2022-10-08 DIAGNOSIS — F419 Anxiety disorder, unspecified: Secondary | ICD-10-CM | POA: Diagnosis not present

## 2022-10-11 NOTE — Telephone Encounter (Signed)
Please see message. Last conversation the neurologist was considering prescribing Nuplazid. ? Ok to prescribe.

## 2022-10-11 NOTE — Telephone Encounter (Signed)
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  She is at [facility] for rehab.  They are dispensing her medications and she is on all medication as prescribed.  [name] would like for you to go ahead and prescribe the Nuplazid.  They need to try it and hopefully it will help [name].  The Neurologist is not responding and not helping in her care.  Please send prescription to [facility] Call [name] to discuss [phone]

## 2022-10-12 NOTE — Telephone Encounter (Signed)
LVM for [name] to [facility].

## 2022-10-13 ENCOUNTER — Telehealth: Payer: Self-pay | Admitting: Diagnostic Neuroimaging

## 2022-10-13 DIAGNOSIS — E785 Hyperlipidemia, unspecified: Secondary | ICD-10-CM | POA: Diagnosis not present

## 2022-10-13 DIAGNOSIS — F419 Anxiety disorder, unspecified: Secondary | ICD-10-CM | POA: Diagnosis not present

## 2022-10-13 DIAGNOSIS — G20A1 Parkinson's disease without dyskinesia, without mention of fluctuations: Secondary | ICD-10-CM | POA: Diagnosis not present

## 2022-10-13 DIAGNOSIS — I10 Essential (primary) hypertension: Secondary | ICD-10-CM | POA: Diagnosis not present

## 2022-10-13 NOTE — Telephone Encounter (Signed)
Pt's daughter is asking to be called on tomorrow to discuss pt's medications.

## 2022-10-14 MED ORDER — NUPLAZID 34 MG PO CAPS: 34.0000 mg | ORAL_CAPSULE | Freq: Every day | ORAL | 6 refills | Status: DC

## 2022-10-14 NOTE — Telephone Encounter (Signed)
Called and spoke with [facility] and she provided a fax number [phone] to send the order to. Order sent and received confirmation that it went through.

## 2022-10-14 NOTE — Telephone Encounter (Signed)
Called the daughter back. She is extremely frustrated because she has not been able to get the care for her mom. 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 The daughter was under the impression that the psych MD spoke with Dr Marjory Lies and Dr Marjory Lies was going to write for the nuplazid. I do not see a order placed for the patient. She received 2 weeks worth of samples from their office but the order was pending [place]. I informed the daughter that I am not seeing where a conversation was had between the two providers. I will take this information to Dr Marjory Lies and see if he is agreeable to prescribing nuplazid if so. This would need to be printed and faxed to the facility [facility] so that they can provide the medication. The number to contact at [facility] would be [phone]. Will reach out to get a fax number and talk with Dr Marjory Lies

## 2022-10-14 NOTE — Telephone Encounter (Signed)
Ok to start nuplazid 34mg  daily.  After 1-2 weeks, can wean off seroquel.  Meds ordered this encounter  Medications   DISCONTD: Pimavanserin Tartrate (NUPLAZID) 34 MG CAPS    Sig: Take 1 capsule (34 mg total) by mouth daily.    Dispense:  30 capsule    Refill:  6   Pimavanserin Tartrate (NUPLAZID) 34 MG CAPS    Sig: Take 1 capsule (34 mg total) by mouth daily.    Dispense:  30 capsule    Refill:  6   Suanne Marker, MD 10/14/2022, 3:06 PM Certified in Neurology, Neurophysiology and Neuroimaging  [facility] [address] [place], [place] [address] [phone]

## 2022-10-14 NOTE — Addendum Note (Signed)
Addended by: Joycelyn Schmid R on: 10/14/2022 03:06 PM   Modules accepted: Orders

## 2022-10-22 ENCOUNTER — Telehealth: Payer: Self-pay | Admitting: Diagnostic Neuroimaging

## 2022-10-22 NOTE — Telephone Encounter (Signed)
Pt's daughter, Romika Crews said [facility] requesting refill for Pimavanserin Tartrate (NUPLAZID) 34 MG CAPS. Pt will be out of medication soon. Would like a call back.

## 2022-10-25 ENCOUNTER — Other Ambulatory Visit: Payer: Self-pay | Admitting: Neurology

## 2022-10-25 NOTE — Telephone Encounter (Signed)
Called the daughter back to advise patient should have enough of medication on file but wanted to confirm pharmacy. The medication was sent in to [facility] but she requires [facility] to fill the medication. The local pharmacy already forwarded the script to the specialty pharmacy and they advised the daughter a PA was needed  PA completed on CMM/[employer] medicare ZOX:[id number] Approved immediately for the pt.

## 2022-10-25 NOTE — Telephone Encounter (Signed)
Pt daughter has called and the [employer] from [place], [place] was read to her.  Daughter is asking if there are samples available while waiting on specialty pharmacy delivery.  Daughter states if no samples are available can RN go on [facility] website and fill out the treatment form in order for them to send samples to pt, please call.

## 2022-10-26 ENCOUNTER — Other Ambulatory Visit: Payer: Self-pay | Admitting: Neurology

## 2022-10-26 MED ORDER — NUPLAZID 34 MG PO CAPS: 34.0000 mg | ORAL_CAPSULE | Freq: Every day | ORAL | 6 refills | Status: DC

## 2022-10-26 NOTE — Telephone Encounter (Signed)
Received a rx request from the specialty pharmacy. I have forwarded the refill request and marked as needing to be urgently sent to the patient. Received confirmation that it went through.   Pimavanserin Tartrate (NUPLAZID) 34 MG CAPS30 capsule69/11/2022--Sig - Route: Take 1 capsule (34 mg total) by mouth daily. - OralSent to pharmacy as: Pimavanserin Tartrate (NUPLAZID) 34 MG Cap Notes to Pharmacy: URGENT. PT IS OUT AND NEEDS OVERNIGHT SHIPMENT> PA APPROVED E-Prescribing Status: Receipt confirmed by pharmacy (10/26/2022 10:39 AM EDT)

## 2022-10-29 ENCOUNTER — Ambulatory Visit: Payer: Medicare PPO | Admitting: Adult Health

## 2022-11-02 ENCOUNTER — Ambulatory Visit: Payer: Medicare PPO | Admitting: Professional Counselor

## 2022-11-05 DIAGNOSIS — R531 Weakness: Secondary | ICD-10-CM | POA: Diagnosis not present

## 2022-11-05 DIAGNOSIS — R2689 Other abnormalities of gait and mobility: Secondary | ICD-10-CM | POA: Diagnosis not present

## 2022-11-05 DIAGNOSIS — G20A1 Parkinson's disease without dyskinesia, without mention of fluctuations: Secondary | ICD-10-CM | POA: Diagnosis not present

## 2022-11-05 DIAGNOSIS — F32A Depression, unspecified: Secondary | ICD-10-CM | POA: Diagnosis not present

## 2022-11-05 DIAGNOSIS — M6281 Muscle weakness (generalized): Secondary | ICD-10-CM | POA: Diagnosis not present

## 2022-11-05 DIAGNOSIS — F419 Anxiety disorder, unspecified: Secondary | ICD-10-CM | POA: Diagnosis not present

## 2022-11-05 DIAGNOSIS — E860 Dehydration: Secondary | ICD-10-CM | POA: Diagnosis not present

## 2022-11-05 DIAGNOSIS — I10 Essential (primary) hypertension: Secondary | ICD-10-CM | POA: Diagnosis not present

## 2022-11-05 DIAGNOSIS — Z4889 Encounter for other specified surgical aftercare: Secondary | ICD-10-CM | POA: Diagnosis not present

## 2022-11-07 DIAGNOSIS — R2689 Other abnormalities of gait and mobility: Secondary | ICD-10-CM | POA: Diagnosis not present

## 2022-11-15 ENCOUNTER — Ambulatory Visit: Payer: Medicare HMO | Admitting: Adult Health

## 2022-11-18 ENCOUNTER — Emergency Department (HOSPITAL_COMMUNITY): Payer: Medicare PPO

## 2022-11-18 ENCOUNTER — Emergency Department (HOSPITAL_COMMUNITY)
Admission: EM | Admit: 2022-11-18 | Discharge: 2022-11-18 | Disposition: A | Discharge status: Home / Self Care | Payer: Medicare PPO | Attending: Emergency Medicine | Admitting: Emergency Medicine

## 2022-11-18 DIAGNOSIS — S22080S Wedge compression fracture of T11-T12 vertebra, sequela: Secondary | ICD-10-CM | POA: Diagnosis not present

## 2022-11-18 DIAGNOSIS — S32000S Wedge compression fracture of unspecified lumbar vertebra, sequela: Secondary | ICD-10-CM | POA: Insufficient documentation

## 2022-11-18 DIAGNOSIS — S22080D Wedge compression fracture of T11-T12 vertebra, subsequent encounter for fracture with routine healing: Secondary | ICD-10-CM | POA: Diagnosis not present

## 2022-11-18 DIAGNOSIS — M47816 Spondylosis without myelopathy or radiculopathy, lumbar region: Secondary | ICD-10-CM | POA: Diagnosis not present

## 2022-11-18 DIAGNOSIS — R251 Tremor, unspecified: Secondary | ICD-10-CM | POA: Diagnosis not present

## 2022-11-18 DIAGNOSIS — R262 Difficulty in walking, not elsewhere classified: Secondary | ICD-10-CM

## 2022-11-18 DIAGNOSIS — M25559 Pain in unspecified hip: Secondary | ICD-10-CM | POA: Diagnosis not present

## 2022-11-18 DIAGNOSIS — M5136 Other intervertebral disc degeneration, lumbar region with discogenic back pain only: Secondary | ICD-10-CM | POA: Diagnosis not present

## 2022-11-18 DIAGNOSIS — X58XXXS Exposure to other specified factors, sequela: Secondary | ICD-10-CM | POA: Insufficient documentation

## 2022-11-18 DIAGNOSIS — G20B2 Parkinson's disease with dyskinesia, with fluctuations: Secondary | ICD-10-CM | POA: Insufficient documentation

## 2022-11-18 DIAGNOSIS — R531 Weakness: Secondary | ICD-10-CM | POA: Diagnosis not present

## 2022-11-18 DIAGNOSIS — I700 Atherosclerosis of aorta: Secondary | ICD-10-CM | POA: Diagnosis not present

## 2022-11-18 DIAGNOSIS — M4854XA Collapsed vertebra, not elsewhere classified, thoracic region, initial encounter for fracture: Secondary | ICD-10-CM | POA: Diagnosis not present

## 2022-11-18 LAB — COMPREHENSIVE METABOLIC PANEL
ALT: 8 U/L (ref 0–44)
AST: 19 U/L (ref 15–41)
Albumin: 3.6 g/dL (ref 3.5–5.0)
Alkaline Phosphatase: 51 U/L (ref 38–126)
Anion gap: 9 (ref 5–15)
BUN: 26 mg/dL — ABNORMAL HIGH (ref 8–23)
CO2: 24 mmol/L (ref 22–32)
Calcium: 9 mg/dL (ref 8.9–10.3)
Chloride: 103 mmol/L (ref 98–111)
Creatinine, Ser: 0.74 mg/dL (ref 0.44–1.00)
GFR, Estimated: >60 mL/min (ref >60)
Glucose, Bld: 96 mg/dL (ref 70–99)
Potassium: 3.9 mmol/L (ref 3.5–5.1)
Sodium: 136 mmol/L (ref 135–145)
Total Bilirubin: 0.5 mg/dL (ref 0.3–1.2)
Total Protein: 6.4 g/dL — ABNORMAL LOW (ref 6.5–8.1)

## 2022-11-18 LAB — URINALYSIS, ROUTINE W REFLEX MICROSCOPIC
Bilirubin Urine: NEGATIVE
Glucose, UA: NEGATIVE mg/dL
Ketones, ur: 5 mg/dL — AB
Nitrite: NEGATIVE
Protein, ur: NEGATIVE mg/dL
Specific Gravity, Urine: 1.02 (ref 1.005–1.030)
pH: 5 (ref 5.0–8.0)

## 2022-11-18 LAB — CBC WITH DIFFERENTIAL/PLATELET
Abs Immature Granulocytes: 0.02 10*3/uL (ref 0.00–0.07)
Basophils Absolute: 0 10*3/uL (ref 0.0–0.1)
Basophils Relative: 0 %
Eosinophils Absolute: 0 10*3/uL (ref 0.0–0.5)
Eosinophils Relative: 0 %
HCT: 42.5 % (ref 36.0–46.0)
Hemoglobin: 14.1 g/dL (ref 12.0–15.0)
Immature Granulocytes: 0 %
Lymphocytes Relative: 9 %
Lymphs Abs: 0.7 10*3/uL (ref 0.7–4.0)
MCH: 31 pg (ref 26.0–34.0)
MCHC: 33.2 g/dL (ref 30.0–36.0)
MCV: 93.4 fL (ref 80.0–100.0)
Monocytes Absolute: 0.6 10*3/uL (ref 0.1–1.0)
Monocytes Relative: 7 %
Neutro Abs: 6.9 10*3/uL (ref 1.7–7.7)
Neutrophils Relative %: 84 %
Platelets: 314 10*3/uL (ref 150–400)
RBC: 4.55 MIL/uL (ref 3.87–5.11)
RDW: 12.7 % (ref 11.5–15.5)
WBC: 8.2 10*3/uL (ref 4.0–10.5)
nRBC: 0 % (ref 0.0–0.2)

## 2022-11-18 NOTE — ED Triage Notes (Signed)
Patient BIB [facility] from home for tremors worsening over the past week with some weakness as well. Patient usually able to ambulate but since leaving rehab she has weakened. Patient has hx of compression fractures as well and family reported concern about this as well, VSS, A&Ox4.

## 2022-11-18 NOTE — ED Notes (Signed)
PT BIB [facility] from home for tremors. EMS called out for weakness, PT endorses extreme weakness and worsening tremors when attempting to stand. PT has HX of parkinsons and HTN.

## 2022-11-18 NOTE — ED Provider Notes (Signed)
[place] EMERGENCY DEPARTMENT AT [facility] Provider Note   CSN: [id number] Arrival date & time: 11/18/22  1136     History  Chief Complaint  Patient presents with   Tremors    Patricia Fitzpatrick is a 77 y.o. female.  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  They are able to assist her somewhat, but cannot provide full support for the patient.  Patient wonders if she needs another 6-day and a short-term skilled nursing facility.      Home Medications Prior to Admission medications   Medication Sig Start Date End Date Taking? Authorizing Provider  acetaminophen (TYLENOL) 500 MG tablet Take 2 tablets (1,000 mg total) by mouth 3 (three) times daily. 09/03/22   Berton Mount I, MD  amLODipine (NORVASC) 5 MG tablet Take 1 tablet (5 mg total) by mouth daily. 09/03/22   Barnetta Chapel, MD  B Complex Vitamins (B COMPLEX PO) Take 1 tablet by mouth daily. Patient not taking: Reported on 09/29/2022    [provider]  carbidopa-levodopa (SINEMET IR) 25-100 MG tablet Take 1.5 tablets by mouth 3 (three) times daily. 04/26/22   Penumalli, Glenford Bayley, MD  Cholecalciferol (VITAMIN D3 PO) Take 50 mcg by mouth daily. Patient not taking: Reported on 09/29/2022    [provider]  clonazePAM (KLONOPIN) 0.5 MG tablet Take 1 tablet (0.5 mg total) by mouth 2 (two) times daily. 1/2 tab in the morning, 1 tab at bedtime daily 10/06/22   Pokhrel, Rebekah Chesterfield, MD  denosumab (PROLIA) 60 MG/ML SOSY injection Inject 60 mg into the skin every 6 (six) months.     [provider]  docusate sodium (COLACE) 100 MG capsule Take 1 capsule (100 mg total) by mouth 2 (two) times daily. 09/03/22   Barnetta Chapel, MD  enoxaparin (LOVENOX) 30 MG/0.3ML injection Inject 0.3 mLs (30 mg total) into the skin daily. Patient not taking: Reported on 09/29/2022 09/04/22   Berton Mount I, MD  feeding supplement (ENSURE ENLIVE / ENSURE PLUS) LIQD Take 237 mLs by mouth 2 (two) times daily between meals. 10/06/22   Pokhrel, Rebekah Chesterfield, MD  FLUoxetine (PROZAC) 40 MG capsule Take 40 mg by mouth 2 (two) times daily.    [provider]  hydrALAZINE (APRESOLINE) 25 MG tablet Take 1 tablet (25 mg total) by mouth every 6 (six) hours as needed (For SBP greater than 170 mmHg). Patient not taking: Reported on 09/29/2022 09/03/22   Berton Mount I, MD  methocarbamol (ROBAXIN) 500 MG tablet Take 1 tablet (500 mg total) by mouth every 8 (eight) hours as needed for muscle spasms. 09/03/22   Barnetta Chapel, MD  ondansetron (ZOFRAN) 4 MG tablet Take 1 tablet (4 mg total) by mouth every 6 (six) hours as needed for nausea. 10/06/22   Pokhrel, Rebekah Chesterfield, MD  oxyCODONE (OXY IR/ROXICODONE) 5 MG immediate release tablet Take 1 tablet (5 mg total) by mouth every 4 (four) hours as needed for severe pain. 10/06/22   Pokhrel, Rebekah Chesterfield, MD  Pimavanserin Tartrate (NUPLAZID) 34 MG CAPS Take 1 capsule (34 mg total) by mouth daily. 10/26/22   Penumalli, Glenford Bayley,  MD  polyethylene glycol (MIRALAX / GLYCOLAX) 17 g packet Take 17 g by mouth daily. 09/03/22   Barnetta Chapel, MD      Allergies    Patient has no known allergies.    Review of Systems   Review of Systems  Physical Exam Updated Vital Signs BP 131/76   Pulse 67   Temp 98.5 F (36.9 C) (Oral)   Resp (!) 22   SpO2 93%  Physical Exam Vitals and nursing note reviewed.  Constitutional:      General: She is not in acute distress.    Appearance: She is well-developed.  HENT:     Head: Normocephalic and atraumatic.      Right Ear: External ear normal.     Left Ear: External ear normal.     Nose: Nose normal.  Eyes:     Conjunctiva/sclera: Conjunctivae normal.  Cardiovascular:     Rate and Rhythm: Normal rate and regular rhythm.     Heart sounds: No murmur heard. Pulmonary:     Effort: No respiratory distress.     Breath sounds: No wheezing, rhonchi or rales.  Abdominal:     Palpations: Abdomen is soft.     Tenderness: There is no abdominal tenderness. There is no guarding or rebound.  Musculoskeletal:     Cervical back: Normal range of motion and neck supple.     Right lower leg: No edema.     Left lower leg: No edema.  Skin:    General: Skin is warm and dry.     Findings: No rash.  Neurological:     General: No focal deficit present.     Mental Status: She is alert and oriented to person, place, and time. Mental status is at baseline.     Cranial Nerves: Cranial nerves 2-12 are intact.     Motor: Weakness (Generalized, no focal unilateral weakness) and tremor present.  Psychiatric:        Mood and Affect: Mood normal.    ED Results / Procedures / Treatments   Labs (all labs ordered are listed, but only abnormal results are displayed) Labs Reviewed  COMPREHENSIVE METABOLIC PANEL - Abnormal; Notable for the following components:      Result Value   BUN 26 (*)    Total Protein 6.4 (*)    All other components within normal limits  URINALYSIS, ROUTINE W REFLEX MICROSCOPIC - Abnormal; Notable for the following components:   Hgb urine dipstick SMALL (*)    Ketones, ur 5 (*)    Leukocytes,Ua TRACE (*)    Bacteria, UA RARE (*)    All other components within normal limits  CBC WITH DIFFERENTIAL/PLATELET    ED ECG REPORT   Date: 11/18/2022  Rate: 67  Rhythm: normal sinus rhythm  QRS Axis: normal  Intervals: normal  ST/T Wave abnormalities: normal  Conduction Disutrbances:none  Narrative Interpretation:   Old EKG Reviewed: unchanged  I have personally reviewed the EKG tracing and  agree with the computerized printout as noted.   Radiology CT Lumbar Spine Wo Contrast  Result Date: 11/18/2022 CLINICAL DATA:  Low back pain, increased fracture risk, heard a pop while sitting, history of multiple compression fractures EXAM: CT LUMBAR SPINE WITHOUT CONTRAST TECHNIQUE: Multidetector CT imaging of the lumbar spine was performed without intravenous contrast administration. Multiplanar CT image reconstructions were also generated. RADIATION DOSE REDUCTION: This exam was performed according to the departmental dose-optimization program which includes automated exposure control, adjustment of the mA and/or kV  according to patient size and/or use of iterative reconstruction technique. COMPARISON:  MRI lumbar spine 09/30/2022 FINDINGS: Segmentation: 5 lumbar type vertebrae. Alignment: No evidence of traumatic malalignment. No significant change from MRI 09/30/2022. Vertebrae: Interval vertebroplasty of L1. Redemonstrated compression fractures T11, T12, L1, and L3. This includes vertebral plana of T11 and 7 mm of bony retropulsion. 70% height loss and 3 mm of retropulsion of T12. 40% height loss of L1 with 30 mm of retropulsion. 30% height loss of L3 with trace 2 mm of retropulsion. No new fracture. Paraspinal and other soft tissues: Aortic atherosclerotic calcification. Disc levels: No significant change from MRI 09/30/2022 and differences in technique. Mild for age degenerative disc disease and facet arthropathy. IMPRESSION: 1. No acute fracture. 2. Interval vertebroplasty of L1. Compression fractures of T11, T12, L1, and L3 are unchanged from MRI 09/30/2022. Electronically Signed   By: Minerva Fester M.D.   On: 11/18/2022 16:19   DG Chest Portable 1 View  Result Date: 11/18/2022 CLINICAL DATA:  Weakness. EXAM: PORTABLE CHEST 1 VIEW COMPARISON:  11/08/2021. FINDINGS: Bilateral lung fields are clear. Bilateral costophrenic angles are clear. Normal cardio-mediastinal silhouette. No acute osseous  abnormalities. Note is made of L1 kyphoplasty. The soft tissues are within normal limits. IMPRESSION: No acute cardiopulmonary process. Electronically Signed   By: Jules Schick M.D.   On: 11/18/2022 14:59    Procedures Procedures    Medications Ordered in ED Medications - No data to display  ED Course/ Medical Decision Making/ A&P    Patient seen and examined. History obtained directly from patient. Also Everlene Other stepdaughter at bedside.  Reviewed previous neurology notes.  Labs/EKG: Ordered CBC, CMP, UA, EKG.  Imaging: Ordered chest x-ray.  Medications/Fluids: None ordered  Most recent vital signs reviewed and are as follows: BP 131/76   Pulse 67   Temp 98.5 F (36.9 C) (Oral)   Resp (!) 22   SpO2 93%   Initial impression: Generalized weakness, tremor, chronic back fracture without obvious signs of acute fracture.  5:19 PM Reassessment performed. Patient appears stable during ED stay.  Transitions of care has seen patient.  They discussed options for skilled care.  At this time patient will not be able to afford out-of-pocket cost associated with skilled nursing or rehab.  Plan will be to go home and have them continue to pursue Medicare coverage as well as home health.  I did briefly discuss case with neurology just in regards to there being any treatments for tremor which may help in this situation.  I did not ask for formal consult.  Recommends physical therapy, outpatient neurology follow-up.  I will place ambulatory referral to see if this helps patient get in with her neurologist sooner.  Labs personally reviewed and interpreted including: CBC unremarkable with normal white blood cell count and normal hemoglobin; CMP unremarkable with normal electrolytes, normal creatinine, minimally elevated BUN; UA without compelling signs of infection.  Imaging personally visualized and interpreted including: CT lumbar spine agree with chronic compression fractures; chest x-ray  agree no acute findings.  Reviewed pertinent lab work and imaging with patient and stepdaughter at bedside. Questions answered.   Most current vital signs reviewed and are as follows: BP 137/70   Pulse 64   Temp 98.7 F (37.1 C) (Oral)   Resp (!) 21   SpO2 96%   Plan: Plan will be for discharge to home today.  Prescriptions written for: None  Other home care instructions discussed: Continue home medications  ED return  instructions discussed: New or worsening symptoms  Follow-up instructions discussed: Patient encouraged to follow-up with their PCP in 3 days.                                 Medical Decision Making Amount and/or Complexity of Data Reviewed Labs: ordered. Radiology: ordered.   Patient was sent in today for increased instabilities, unable to see PCP.  It seems that over the past couple of days her tremors have been the biggest barrier to ambulating at baseline.  Metabolic workup was undertaken today without any acute abnormalities.  Patient did have a pop in her back recently without any pain, lumbar CT is stable.  She has good strength in her lower extremities on exam and I have low concern for new cord issue or cauda equina.  She reports increased frequency, no urinary retention or fecal incontinence.  UA is negative for infection today.  Transitions of care input in this case is very much appreciated.  Patient will follow-up with her providers.         Final Clinical Impression(s) / ED Diagnoses Final diagnoses:  Tremor  Trouble walking  Compression fracture of lumbar vertebra, unspecified lumbar vertebral level, sequela  Parkinson's disease with dyskinesia and fluctuating manifestations (HCC)    Rx / DC Orders ED Discharge Orders          Ordered    Ambulatory referral to Neurology       Comments: An appointment is requested in approximately: 1 week for worsening tremor and trouble ambulating in patient with h/o Parkinson's disease.   11/18/22  1721              Renne Crigler, PA-C 11/18/22 1724    Benjiman Core, MD 11/19/22 [phone]

## 2022-11-18 NOTE — Progress Notes (Signed)
CSW spoke with [place] in admissions at [facility]. CSW was told patient discharged from their facility on 11/06/22. [facility] stated they could take patient back but patient would be in copay days. Patient would have to pay over $200 daily to go back to SNF due to patient not being out of her wellness days.    CSW spoke with patient and patients daughter Patricia Fitzpatrick at bedside to inform them of the copay. Patients daughter Patricia Fitzpatrick stated she was aware of the issue with patients insurance and another SNF stay. Patient and daughter stated they cannot afford the copay. CSW told the family that she could give them resources for a home aide while they apply for medicaid. Patients daughter stated she has been doing her research and found some facilities that will take patient medicaid pending. Patients daughter understands patient might be discharged from the emergency room. CSW also told the family she will have [facility] reach out to them to screen patient for medicaid. Patients family stated going to back to SNF with a copay was not an option for them right now.    CSW spoke with Ms. Patricia Fitzpatrick with [facility] who will speak with the family

## 2022-11-18 NOTE — Discharge Instructions (Addendum)
Your lab workup today was reassuring.  The CT scan of your lumbar spine showed multiple compression fractures, but no new or acute worsening of your old fractures.

## 2022-11-18 NOTE — ED Notes (Signed)
PT moved to wheelchair and taken outside, PT's family still not arrived in pick up location, PT moved back to lobby and left with both lobby nurse techs due to being a fall risk.

## 2022-11-22 ENCOUNTER — Telehealth: Payer: Self-pay | Admitting: Diagnostic Neuroimaging

## 2022-11-22 NOTE — Telephone Encounter (Signed)
Pt taking caridopa levo dopa 25/100mg  1.5 tablet three times a day and having increase in tremors. Was recently evaluated at the ER 10/3. Will make Dr Marjory Lies aware and see what he recommends

## 2022-11-22 NOTE — Telephone Encounter (Signed)
Pt's daughter, Patricia Fitzpatrick Asking if can increase her carbidopa-levodopa (SINEMET IR) 25-100 MG tablet due to having parkinson's disease.  Tremors have gotten worse, she can not control her tremors. Asking can you do something until her appt in December.

## 2022-11-23 MED ORDER — CARBIDOPA-LEVODOPA 25-100 MG PO TABS: 2.0000 | ORAL_TABLET | Freq: Three times a day (TID) | ORAL | 4 refills | Status: DC

## 2022-11-23 NOTE — Telephone Encounter (Signed)
Called the daughter back and was able to inform her that Dr Marjory Lies will increase the medication to 2 tablet three times a day. She was appreciative for the information.

## 2022-11-23 NOTE — Addendum Note (Signed)
Addended by: Joycelyn Schmid R on: 11/23/2022 01:26 PM   Modules accepted: Orders

## 2022-11-23 NOTE — Telephone Encounter (Signed)
Meds ordered this encounter  Medications   carbidopa-levodopa (SINEMET IR) 25-100 MG tablet    Sig: Take 2 tablets by mouth 3 (three) times daily.    Dispense:  540 tablet    Refill:  4   Suanne Marker, MD 11/23/2022, 1:25 PM Certified in Neurology, Neurophysiology and Neuroimaging  [facility] [address], [address] [place], [place] [address] [phone]

## 2022-12-02 DIAGNOSIS — M80052D Age-related osteoporosis with current pathological fracture, left femur, subsequent encounter for fracture with routine healing: Secondary | ICD-10-CM | POA: Diagnosis not present

## 2022-12-02 DIAGNOSIS — M4856XD Collapsed vertebra, not elsewhere classified, lumbar region, subsequent encounter for fracture with routine healing: Secondary | ICD-10-CM | POA: Diagnosis not present

## 2022-12-02 DIAGNOSIS — R419 Unspecified symptoms and signs involving cognitive functions and awareness: Secondary | ICD-10-CM | POA: Diagnosis not present

## 2022-12-02 DIAGNOSIS — I10 Essential (primary) hypertension: Secondary | ICD-10-CM | POA: Diagnosis not present

## 2022-12-02 DIAGNOSIS — I700 Atherosclerosis of aorta: Secondary | ICD-10-CM | POA: Diagnosis not present

## 2022-12-02 DIAGNOSIS — M800B2D Age-related osteoporosis with current pathological fracture, left pelvis, subsequent encounter for fracture with routine healing: Secondary | ICD-10-CM | POA: Diagnosis not present

## 2022-12-02 DIAGNOSIS — M4854XD Collapsed vertebra, not elsewhere classified, thoracic region, subsequent encounter for fracture with routine healing: Secondary | ICD-10-CM | POA: Diagnosis not present

## 2022-12-02 DIAGNOSIS — G20A1 Parkinson's disease without dyskinesia, without mention of fluctuations: Secondary | ICD-10-CM | POA: Diagnosis not present

## 2022-12-02 DIAGNOSIS — I2510 Atherosclerotic heart disease of native coronary artery without angina pectoris: Secondary | ICD-10-CM | POA: Diagnosis not present

## 2022-12-04 ENCOUNTER — Emergency Department (HOSPITAL_COMMUNITY): Payer: Medicare PPO

## 2022-12-04 ENCOUNTER — Emergency Department (HOSPITAL_COMMUNITY)
Admission: EM | Admit: 2022-12-04 | Discharge: 2022-12-05 | Disposition: A | Discharge status: Home / Self Care | Payer: Medicare PPO | Attending: Emergency Medicine | Admitting: Emergency Medicine

## 2022-12-04 ENCOUNTER — Encounter (HOSPITAL_COMMUNITY): Payer: Self-pay | Admitting: *Deleted

## 2022-12-04 ENCOUNTER — Other Ambulatory Visit: Payer: Self-pay

## 2022-12-04 DIAGNOSIS — R0902 Hypoxemia: Secondary | ICD-10-CM | POA: Diagnosis not present

## 2022-12-04 DIAGNOSIS — Z1152 Encounter for screening for COVID-19: Secondary | ICD-10-CM | POA: Insufficient documentation

## 2022-12-04 DIAGNOSIS — R531 Weakness: Secondary | ICD-10-CM | POA: Diagnosis not present

## 2022-12-04 DIAGNOSIS — G20C Parkinsonism, unspecified: Secondary | ICD-10-CM | POA: Insufficient documentation

## 2022-12-04 DIAGNOSIS — I959 Hypotension, unspecified: Secondary | ICD-10-CM | POA: Diagnosis not present

## 2022-12-04 LAB — BASIC METABOLIC PANEL
Anion gap: 12 (ref 5–15)
BUN: 26 mg/dL — ABNORMAL HIGH (ref 8–23)
CO2: 21 mmol/L — ABNORMAL LOW (ref 22–32)
Calcium: 9.5 mg/dL (ref 8.9–10.3)
Chloride: 103 mmol/L (ref 98–111)
Creatinine, Ser: 0.72 mg/dL (ref 0.44–1.00)
GFR, Estimated: >60 mL/min (ref >60)
Glucose, Bld: 98 mg/dL (ref 70–99)
Potassium: 4.4 mmol/L (ref 3.5–5.1)
Sodium: 136 mmol/L (ref 135–145)

## 2022-12-04 LAB — RESP PANEL BY RT-PCR (RSV, FLU A&B, COVID)  RVPGX2
Influenza A by PCR: NEGATIVE
Influenza B by PCR: NEGATIVE
Resp Syncytial Virus by PCR: NEGATIVE
SARS Coronavirus 2 by RT PCR: NEGATIVE

## 2022-12-04 LAB — CBC
HCT: 45.6 % (ref 36.0–46.0)
Hemoglobin: 15 g/dL (ref 12.0–15.0)
MCH: 31.3 pg (ref 26.0–34.0)
MCHC: 32.9 g/dL (ref 30.0–36.0)
MCV: 95 fL (ref 80.0–100.0)
Platelets: 310 10*3/uL (ref 150–400)
RBC: 4.8 MIL/uL (ref 3.87–5.11)
RDW: 12.2 % (ref 11.5–15.5)
WBC: 6.8 10*3/uL (ref 4.0–10.5)
nRBC: 0 % (ref 0.0–0.2)

## 2022-12-04 LAB — CBG MONITORING, ED: Glucose-Capillary: 102 mg/dL — ABNORMAL HIGH (ref 70–99)

## 2022-12-04 MED ORDER — CARBIDOPA-LEVODOPA 25-100 MG PO TABS
2.0000 | ORAL_TABLET | Freq: Three times a day (TID) | ORAL | Status: DC
  Administered 2022-12-04: 2 via ORAL
  Filled 2022-12-04: qty 2

## 2022-12-04 MED ORDER — CLONAZEPAM 0.5 MG PO TABS
1.0000 mg | ORAL_TABLET | Freq: Every day | ORAL | Status: DC
  Administered 2022-12-04: 1 mg via ORAL
  Filled 2022-12-04: qty 2

## 2022-12-04 MED ORDER — ACETAMINOPHEN 325 MG PO TABS
650.0000 mg | ORAL_TABLET | Freq: Four times a day (QID) | ORAL | Status: DC | PRN
  Administered 2022-12-04: 650 mg via ORAL
  Filled 2022-12-04: qty 2

## 2022-12-04 MED ORDER — CARBIDOPA-LEVODOPA 25-250 MG PO TABS
2.0000 | ORAL_TABLET | Freq: Three times a day (TID) | ORAL | Status: DC
Start: 2022-12-04 — End: 2022-12-04

## 2022-12-04 NOTE — Care Management (Signed)
ED RNCM re-attempted  and sent a HIPAA compliant text message to both emergency contacts Brother Leticia Penna [phone] and daughter Jodilyn Byard [phone] requesting a return call to [facility] ED updated Pennie Rushing RN in ED.

## 2022-12-04 NOTE — Discharge Instructions (Addendum)
  It was a pleasure taking care of you today.  Your blood tests and xrays were reassuring and normal today.  We had our case manager confirm home health visit with [facility] home health in next 24-48 hours - they will contact you.  If you don't hear from them call the number above.  A new order was placed by the ER doctor today to ensure home health.  Please call your primary care provider's office and let them know about your ongoing weakness and tremors.  Their office may be able to assist you with searching for other placement or care options.  This is best done as an outpatient, as this process can be quite long, and should not be done through the emergency department.

## 2022-12-04 NOTE — Care Management (Signed)
Home Health Services arrange with [facility] agency. Referral accepted by Liaison Lorenza Chick. Attempted to contactfamily to update them. Information has be placed on AVS and EDP and RN have also been updated. No further ED Case Management needs have been identified.  Michel Bickers RN, BSN CNOR ED RN Care Manager [phone]

## 2022-12-04 NOTE — ED Notes (Signed)
10 pt  ahead called Patricia Fitzpatrick

## 2022-12-04 NOTE — ED Notes (Signed)
CALLED [facility] FOR TRANSPORT °

## 2022-12-04 NOTE — ED Provider Notes (Signed)
Patient is currently in boarding status in the ED as we are attempting to reach family members to confirm that she can safely be received home.  TOC following case as well.    Please have RN continue attempting to reach family.   Terald Sleeper, MD 12/04/22 [phone]

## 2022-12-04 NOTE — ED Provider Triage Note (Signed)
Emergency Medicine Provider Triage Evaluation Note  Patricia Fitzpatrick , a 77 y.o. female  was evaluated in triage.  Pt complains of increased generalized weakness. History of Parkinson disease. She was in rehab at [facility] and [place]. She has been home for 3 weeks. She was walking with a walker at rehab but she is not walking at home. Denies any recent fall or head injury. Denies chest pain, shortness of breath, nausea, vomiting.   Review of Systems  Positive: As above  Negative: As above  Physical Exam  BP 101/71 (BP Location: Right Arm)   Pulse 64   Temp 97.8 F (36.6 C) (Oral)   Resp 18   Ht 5' (1.524 m)   Wt 42.2 kg   SpO2 94%   BMI 18.16 kg/m  Gen:   Awake, no distress   Resp:  Normal effort  MSK:   Moves extremities without difficulty  Other:    Medical Decision Making  Medically screening exam initiated at 10:59 AM.  Appropriate orders placed.  MARGRETT AUDLEY was informed that the remainder of the evaluation will be completed by another provider, this initial triage assessment does not replace that evaluation, and the importance of remaining in the ED until their evaluation is complete.    Jeanelle Malling, [place] 12/04/22 1113

## 2022-12-04 NOTE — ED Provider Notes (Signed)
[place] EMERGENCY DEPARTMENT AT [facility] Provider Note   CSN: [id number] Arrival date & time: 12/04/22  1028     History  Chief Complaint  Patient presents with   Weakness    Patricia Fitzpatrick is a 77 y.o. female with a history of chronic deconditioning, Parkinson's, presenting to the ED with generalized weakness.  Patient reports that she has had progressive decline in her strength over the past several days, which include significant episodes of shaking of her arms or legs.  This worsened last night prompting her call to EMS.  She reports she has been eating and drinking fine at home.  She lives in a house that she owns with her daughter-in-law and her brother, both of whom have medical problems, and she reports they are not able to fully care for her.  The patient had been in rehab earlier this year for this general deconditioning and then discharged home.  She felt that she did okay in rehab but got weaker at home.  Per my review of external records the patient was seen in the ED on October 3, for general deconditioning and weakness, at that time had a social work evaluation.  Unfortunately the patient feel members cannot afford the co-pay back to rehab.  The patient tells me that she "I have a house and a bit of land I could sell if I need to".  She also states that she was told she would have home physical therapy set up but this has not been established.  She tells me she has not been able to reach her PCP or neurologist.  I do see a note from neurology on October 8, where the Sinemet dosing was increased due to the patient's tremors.  HPI     Home Medications Prior to Admission medications   Medication Sig Start Date End Date Taking? Authorizing Provider  acetaminophen (TYLENOL) 500 MG tablet Take 2 tablets (1,000 mg total) by mouth 3 (three) times daily. Patient taking differently: Take 1,000 mg by mouth every 8 (eight) hours as needed for mild pain or moderate  pain. 09/03/22   Berton Mount I, MD  amLODipine (NORVASC) 5 MG tablet Take 1 tablet (5 mg total) by mouth daily. 09/03/22   Barnetta Chapel, MD  carbidopa-levodopa (SINEMET IR) 25-100 MG tablet Take 2 tablets by mouth 3 (three) times daily. 11/23/22   Penumalli, Glenford Bayley, MD  clonazePAM (KLONOPIN) 0.5 MG tablet Take 1 tablet (0.5 mg total) by mouth 2 (two) times daily. 1/2 tab in the morning, 1 tab at bedtime daily Patient taking differently: Take 0.5 mg by mouth at bedtime. 10/06/22   Pokhrel, Rebekah Chesterfield, MD  denosumab (PROLIA) 60 MG/ML SOSY injection Inject 60 mg into the skin every 6 (six) months.    [provider]  FLUoxetine (PROZAC) 40 MG capsule Take 40 mg by mouth 2 (two) times daily.    [provider]  MELOXICAM PO Take 1 tablet by mouth daily as needed (with tylenol).    [provider]  methocarbamol (ROBAXIN) 500 MG tablet Take 1 tablet (500 mg total) by mouth every 8 (eight) hours as needed for muscle spasms. 09/03/22   Berton Mount I, MD  oxyCODONE (OXY IR/ROXICODONE) 5 MG immediate release tablet Take 1 tablet (5 mg total) by mouth every 4 (four) hours as needed for severe pain. 10/06/22   Pokhrel, Rebekah Chesterfield, MD  Pimavanserin Tartrate (NUPLAZID) 34 MG CAPS Take 1 capsule (34 mg total) by mouth daily.  10/26/22   Penumalli, Glenford Bayley, MD  polyethylene glycol (MIRALAX / GLYCOLAX) 17 g packet Take 17 g by mouth daily. Patient taking differently: Take 17 g by mouth daily as needed for mild constipation. 09/03/22   Barnetta Chapel, MD      Allergies    Patient has no known allergies.    Review of Systems   Review of Systems  Physical Exam Updated Vital Signs BP 106/72   Pulse 67   Temp 97.7 F (36.5 C) (Oral)   Resp 16   Ht 5' (1.524 m)   Wt 42.2 kg   SpO2 95%   BMI 18.16 kg/m  Physical Exam Constitutional:      General: She is not in acute distress. HENT:     Head: Normocephalic and atraumatic.  Eyes:     Conjunctiva/sclera: Conjunctivae  normal.     Pupils: Pupils are equal, round, and reactive to light.  Cardiovascular:     Rate and Rhythm: Normal rate and regular rhythm.  Pulmonary:     Effort: Pulmonary effort is normal. No respiratory distress.  Abdominal:     General: There is no distension.     Tenderness: There is no abdominal tenderness.  Skin:    General: Skin is warm and dry.  Neurological:     General: No focal deficit present.     Mental Status: She is alert and oriented to person, place, and time. Mental status is at baseline.  Psychiatric:        Mood and Affect: Mood normal.        Behavior: Behavior normal.     ED Results / Procedures / Treatments   Labs (all labs ordered are listed, but only abnormal results are displayed) Labs Reviewed  BASIC METABOLIC PANEL - Abnormal; Notable for the following components:      Result Value   CO2 21 (*)    BUN 26 (*)    All other components within normal limits  CBG MONITORING, ED - Abnormal; Notable for the following components:   Glucose-Capillary 102 (*)    All other components within normal limits  RESP PANEL BY RT-PCR (RSV, FLU A&B, COVID)  RVPGX2  CBC  URINALYSIS, ROUTINE W REFLEX MICROSCOPIC    EKG EKG Interpretation Date/Time:  Saturday December 04 2022 11:31:02 EDT Ventricular Rate:  69 PR Interval:  140 QRS Duration:  88 QT Interval:  423 QTC Calculation: 454 R Axis:   -15  Text Interpretation: Sinus rhythm Consider left atrial enlargement Borderline left axis deviation Confirmed by Alvester Chou [id number]) on 12/04/2022 12:01:19 PM  Radiology DG Chest 1 View  Result Date: 12/04/2022 CLINICAL DATA:  Weakness.  Hypotension.  Parkinson's disease. EXAM: CHEST  1 VIEW COMPARISON:  11/18/2022 FINDINGS: The heart size and mediastinal contours are within normal limits. Both lungs are clear. Upper thoracic dextroscoliosis remains stable. IMPRESSION: No active disease. Electronically Signed   By: Danae Orleans M.D.   On: 12/04/2022 12:56     Procedures Procedures    Medications Ordered in ED Medications  acetaminophen (TYLENOL) tablet 650 mg (has no administration in time range)  clonazePAM (KLONOPIN) tablet 1 mg (has no administration in time range)  carbidopa-levodopa (SINEMET IR) 25-100 MG per tablet immediate release 2 tablet (has no administration in time range)    ED Course/ Medical Decision Making/ A&P  Medical Decision Making Amount and/or Complexity of Data Reviewed Labs: ordered.  Risk OTC drugs. Prescription drug management.   This patient presents to the ED with concern for generalized shaking and weakness. This involves an extensive number of treatment options, and is a complaint that carries with it a high risk of complications and morbidity.  The differential diagnosis includes chronic deconditioning versus other  My suspicion is that this is likely chronic deconditioning at this time.  I have not identified any acute pathology.  The patient does not demonstrate significant parkinsonian tremor, and also appears to be appropriately medicated with Sinemet for this condition.  Co-morbidities that complicate the patient evaluation: History of Parkinson's  External records from outside source obtained and reviewed including TOC/SW note 11/18/22  I ordered and personally interpreted labs.  The pertinent results include: No emergent findings  I ordered imaging studies including x-ray of the chest I independently visualized and interpreted imaging which showed no emergent finding I agree with the radiologist interpretation  The patient was maintained on a cardiac monitor.  I personally viewed and interpreted the cardiac monitored which showed an underlying rhythm of: Sinus rhythm  Per my interpretation the patient's ECG shows normal sinus rhythm no acute ischemia  I have reviewed the patients home medicines and have made adjustments as needed  Test Considered: Low  suspicion for acute stroke, acute PE.  No indication for neuroimaging or angiogram at this time.  Doubt spinal cord lesion.   After the interventions noted above, I reevaluated the patient and found that they have: stayed the same  Social Determinants of Health: Transition of care team consulted to inquire into the patient's insurance status and home physical therapy status.  Dispostion:  After consideration of the diagnostic results and the patients response to treatment, I feel that the patent would benefit from outpatient follow-up and home health resources.  Her case manager was able to confirm [facility] to call patient within 24-48 hours business hours to set up home health.  Additional home health face to face order placed by myself.  Patient is needing PTAR to return home, but does not have a key to get back into her house, and unfortunately have not been able to reach her family members after multiple attempts are made to call them.  We need to confirm that she can get back into the home.  In the meantime I reordered her home medications and a regular diet, and TOC/RN team will continue to try to reach family members.  There is no emergent indication for hospitalization.         Final Clinical Impression(s) / ED Diagnoses Final diagnoses:  Weakness    Rx / DC Orders ED Discharge Orders     None         Terald Sleeper, MD 12/04/22 [phone]

## 2022-12-04 NOTE — ED Triage Notes (Signed)
Patient presents to ed via [facility] from home states she feels like she is getting weaker, was in rehab at [facility] and last at [place] states she thinks she has been home for about 4 weeks was walking with a walker at rehab however  isn't walking at home family didn't think it was safe without PT, states PT hasn't started yet. Patient is alert and oriented.

## 2022-12-04 NOTE — ED Notes (Signed)
CBG 102 

## 2022-12-05 DIAGNOSIS — R2689 Other abnormalities of gait and mobility: Secondary | ICD-10-CM | POA: Diagnosis not present

## 2022-12-05 DIAGNOSIS — Z7401 Bed confinement status: Secondary | ICD-10-CM | POA: Diagnosis not present

## 2022-12-05 DIAGNOSIS — R531 Weakness: Secondary | ICD-10-CM | POA: Diagnosis not present

## 2022-12-06 DIAGNOSIS — M800B2D Age-related osteoporosis with current pathological fracture, left pelvis, subsequent encounter for fracture with routine healing: Secondary | ICD-10-CM | POA: Diagnosis not present

## 2022-12-06 DIAGNOSIS — M4854XD Collapsed vertebra, not elsewhere classified, thoracic region, subsequent encounter for fracture with routine healing: Secondary | ICD-10-CM | POA: Diagnosis not present

## 2022-12-06 DIAGNOSIS — I2510 Atherosclerotic heart disease of native coronary artery without angina pectoris: Secondary | ICD-10-CM | POA: Diagnosis not present

## 2022-12-06 DIAGNOSIS — M4856XD Collapsed vertebra, not elsewhere classified, lumbar region, subsequent encounter for fracture with routine healing: Secondary | ICD-10-CM | POA: Diagnosis not present

## 2022-12-06 DIAGNOSIS — M80052D Age-related osteoporosis with current pathological fracture, left femur, subsequent encounter for fracture with routine healing: Secondary | ICD-10-CM | POA: Diagnosis not present

## 2022-12-06 DIAGNOSIS — I700 Atherosclerosis of aorta: Secondary | ICD-10-CM | POA: Diagnosis not present

## 2022-12-06 DIAGNOSIS — R419 Unspecified symptoms and signs involving cognitive functions and awareness: Secondary | ICD-10-CM | POA: Diagnosis not present

## 2022-12-06 DIAGNOSIS — G20A1 Parkinson's disease without dyskinesia, without mention of fluctuations: Secondary | ICD-10-CM | POA: Diagnosis not present

## 2022-12-06 DIAGNOSIS — I10 Essential (primary) hypertension: Secondary | ICD-10-CM | POA: Diagnosis not present

## 2022-12-08 DIAGNOSIS — R419 Unspecified symptoms and signs involving cognitive functions and awareness: Secondary | ICD-10-CM | POA: Diagnosis not present

## 2022-12-08 DIAGNOSIS — M800B2D Age-related osteoporosis with current pathological fracture, left pelvis, subsequent encounter for fracture with routine healing: Secondary | ICD-10-CM | POA: Diagnosis not present

## 2022-12-08 DIAGNOSIS — I2510 Atherosclerotic heart disease of native coronary artery without angina pectoris: Secondary | ICD-10-CM | POA: Diagnosis not present

## 2022-12-08 DIAGNOSIS — M4856XD Collapsed vertebra, not elsewhere classified, lumbar region, subsequent encounter for fracture with routine healing: Secondary | ICD-10-CM | POA: Diagnosis not present

## 2022-12-08 DIAGNOSIS — M80052D Age-related osteoporosis with current pathological fracture, left femur, subsequent encounter for fracture with routine healing: Secondary | ICD-10-CM | POA: Diagnosis not present

## 2022-12-08 DIAGNOSIS — I700 Atherosclerosis of aorta: Secondary | ICD-10-CM | POA: Diagnosis not present

## 2022-12-08 DIAGNOSIS — I10 Essential (primary) hypertension: Secondary | ICD-10-CM | POA: Diagnosis not present

## 2022-12-08 DIAGNOSIS — G20A1 Parkinson's disease without dyskinesia, without mention of fluctuations: Secondary | ICD-10-CM | POA: Diagnosis not present

## 2022-12-08 DIAGNOSIS — M4854XD Collapsed vertebra, not elsewhere classified, thoracic region, subsequent encounter for fracture with routine healing: Secondary | ICD-10-CM | POA: Diagnosis not present

## 2022-12-10 DIAGNOSIS — I2510 Atherosclerotic heart disease of native coronary artery without angina pectoris: Secondary | ICD-10-CM | POA: Diagnosis not present

## 2022-12-10 DIAGNOSIS — I700 Atherosclerosis of aorta: Secondary | ICD-10-CM | POA: Diagnosis not present

## 2022-12-10 DIAGNOSIS — G20A1 Parkinson's disease without dyskinesia, without mention of fluctuations: Secondary | ICD-10-CM | POA: Diagnosis not present

## 2022-12-10 DIAGNOSIS — M800B2D Age-related osteoporosis with current pathological fracture, left pelvis, subsequent encounter for fracture with routine healing: Secondary | ICD-10-CM | POA: Diagnosis not present

## 2022-12-10 DIAGNOSIS — M4856XD Collapsed vertebra, not elsewhere classified, lumbar region, subsequent encounter for fracture with routine healing: Secondary | ICD-10-CM | POA: Diagnosis not present

## 2022-12-10 DIAGNOSIS — I10 Essential (primary) hypertension: Secondary | ICD-10-CM | POA: Diagnosis not present

## 2022-12-10 DIAGNOSIS — M4854XD Collapsed vertebra, not elsewhere classified, thoracic region, subsequent encounter for fracture with routine healing: Secondary | ICD-10-CM | POA: Diagnosis not present

## 2022-12-10 DIAGNOSIS — R419 Unspecified symptoms and signs involving cognitive functions and awareness: Secondary | ICD-10-CM | POA: Diagnosis not present

## 2022-12-10 DIAGNOSIS — M80052D Age-related osteoporosis with current pathological fracture, left femur, subsequent encounter for fracture with routine healing: Secondary | ICD-10-CM | POA: Diagnosis not present

## 2022-12-13 DIAGNOSIS — I2510 Atherosclerotic heart disease of native coronary artery without angina pectoris: Secondary | ICD-10-CM | POA: Diagnosis not present

## 2022-12-13 DIAGNOSIS — I10 Essential (primary) hypertension: Secondary | ICD-10-CM | POA: Diagnosis not present

## 2022-12-13 DIAGNOSIS — M4854XD Collapsed vertebra, not elsewhere classified, thoracic region, subsequent encounter for fracture with routine healing: Secondary | ICD-10-CM | POA: Diagnosis not present

## 2022-12-13 DIAGNOSIS — I700 Atherosclerosis of aorta: Secondary | ICD-10-CM | POA: Diagnosis not present

## 2022-12-13 DIAGNOSIS — R419 Unspecified symptoms and signs involving cognitive functions and awareness: Secondary | ICD-10-CM | POA: Diagnosis not present

## 2022-12-13 DIAGNOSIS — M800B2D Age-related osteoporosis with current pathological fracture, left pelvis, subsequent encounter for fracture with routine healing: Secondary | ICD-10-CM | POA: Diagnosis not present

## 2022-12-13 DIAGNOSIS — G20A1 Parkinson's disease without dyskinesia, without mention of fluctuations: Secondary | ICD-10-CM | POA: Diagnosis not present

## 2022-12-13 DIAGNOSIS — M4856XD Collapsed vertebra, not elsewhere classified, lumbar region, subsequent encounter for fracture with routine healing: Secondary | ICD-10-CM | POA: Diagnosis not present

## 2022-12-13 DIAGNOSIS — M80052D Age-related osteoporosis with current pathological fracture, left femur, subsequent encounter for fracture with routine healing: Secondary | ICD-10-CM | POA: Diagnosis not present

## 2022-12-14 DIAGNOSIS — M4854XD Collapsed vertebra, not elsewhere classified, thoracic region, subsequent encounter for fracture with routine healing: Secondary | ICD-10-CM | POA: Diagnosis not present

## 2022-12-14 DIAGNOSIS — I2510 Atherosclerotic heart disease of native coronary artery without angina pectoris: Secondary | ICD-10-CM | POA: Diagnosis not present

## 2022-12-14 DIAGNOSIS — G20A1 Parkinson's disease without dyskinesia, without mention of fluctuations: Secondary | ICD-10-CM | POA: Diagnosis not present

## 2022-12-14 DIAGNOSIS — M80052D Age-related osteoporosis with current pathological fracture, left femur, subsequent encounter for fracture with routine healing: Secondary | ICD-10-CM | POA: Diagnosis not present

## 2022-12-14 DIAGNOSIS — I700 Atherosclerosis of aorta: Secondary | ICD-10-CM | POA: Diagnosis not present

## 2022-12-14 DIAGNOSIS — I10 Essential (primary) hypertension: Secondary | ICD-10-CM | POA: Diagnosis not present

## 2022-12-14 DIAGNOSIS — M800B2D Age-related osteoporosis with current pathological fracture, left pelvis, subsequent encounter for fracture with routine healing: Secondary | ICD-10-CM | POA: Diagnosis not present

## 2022-12-14 DIAGNOSIS — M4856XD Collapsed vertebra, not elsewhere classified, lumbar region, subsequent encounter for fracture with routine healing: Secondary | ICD-10-CM | POA: Diagnosis not present

## 2022-12-15 DIAGNOSIS — R419 Unspecified symptoms and signs involving cognitive functions and awareness: Secondary | ICD-10-CM | POA: Diagnosis not present

## 2022-12-15 DIAGNOSIS — I10 Essential (primary) hypertension: Secondary | ICD-10-CM | POA: Diagnosis not present

## 2022-12-15 DIAGNOSIS — I700 Atherosclerosis of aorta: Secondary | ICD-10-CM | POA: Diagnosis not present

## 2022-12-15 DIAGNOSIS — M80052D Age-related osteoporosis with current pathological fracture, left femur, subsequent encounter for fracture with routine healing: Secondary | ICD-10-CM | POA: Diagnosis not present

## 2022-12-15 DIAGNOSIS — M4854XD Collapsed vertebra, not elsewhere classified, thoracic region, subsequent encounter for fracture with routine healing: Secondary | ICD-10-CM | POA: Diagnosis not present

## 2022-12-15 DIAGNOSIS — G20A1 Parkinson's disease without dyskinesia, without mention of fluctuations: Secondary | ICD-10-CM | POA: Diagnosis not present

## 2022-12-15 DIAGNOSIS — M800B2D Age-related osteoporosis with current pathological fracture, left pelvis, subsequent encounter for fracture with routine healing: Secondary | ICD-10-CM | POA: Diagnosis not present

## 2022-12-15 DIAGNOSIS — I2510 Atherosclerotic heart disease of native coronary artery without angina pectoris: Secondary | ICD-10-CM | POA: Diagnosis not present

## 2022-12-15 DIAGNOSIS — M4856XD Collapsed vertebra, not elsewhere classified, lumbar region, subsequent encounter for fracture with routine healing: Secondary | ICD-10-CM | POA: Diagnosis not present

## 2022-12-16 DIAGNOSIS — M800B2D Age-related osteoporosis with current pathological fracture, left pelvis, subsequent encounter for fracture with routine healing: Secondary | ICD-10-CM | POA: Diagnosis not present

## 2022-12-16 DIAGNOSIS — M80052D Age-related osteoporosis with current pathological fracture, left femur, subsequent encounter for fracture with routine healing: Secondary | ICD-10-CM | POA: Diagnosis not present

## 2022-12-16 DIAGNOSIS — I700 Atherosclerosis of aorta: Secondary | ICD-10-CM | POA: Diagnosis not present

## 2022-12-16 DIAGNOSIS — I2510 Atherosclerotic heart disease of native coronary artery without angina pectoris: Secondary | ICD-10-CM | POA: Diagnosis not present

## 2022-12-16 DIAGNOSIS — G20A1 Parkinson's disease without dyskinesia, without mention of fluctuations: Secondary | ICD-10-CM | POA: Diagnosis not present

## 2022-12-16 DIAGNOSIS — M4854XD Collapsed vertebra, not elsewhere classified, thoracic region, subsequent encounter for fracture with routine healing: Secondary | ICD-10-CM | POA: Diagnosis not present

## 2022-12-16 DIAGNOSIS — R419 Unspecified symptoms and signs involving cognitive functions and awareness: Secondary | ICD-10-CM | POA: Diagnosis not present

## 2022-12-16 DIAGNOSIS — I10 Essential (primary) hypertension: Secondary | ICD-10-CM | POA: Diagnosis not present

## 2022-12-16 DIAGNOSIS — M4856XD Collapsed vertebra, not elsewhere classified, lumbar region, subsequent encounter for fracture with routine healing: Secondary | ICD-10-CM | POA: Diagnosis not present

## 2022-12-17 DIAGNOSIS — M4854XD Collapsed vertebra, not elsewhere classified, thoracic region, subsequent encounter for fracture with routine healing: Secondary | ICD-10-CM | POA: Diagnosis not present

## 2022-12-17 DIAGNOSIS — R419 Unspecified symptoms and signs involving cognitive functions and awareness: Secondary | ICD-10-CM | POA: Diagnosis not present

## 2022-12-17 DIAGNOSIS — G20A1 Parkinson's disease without dyskinesia, without mention of fluctuations: Secondary | ICD-10-CM | POA: Diagnosis not present

## 2022-12-17 DIAGNOSIS — I2510 Atherosclerotic heart disease of native coronary artery without angina pectoris: Secondary | ICD-10-CM | POA: Diagnosis not present

## 2022-12-17 DIAGNOSIS — I10 Essential (primary) hypertension: Secondary | ICD-10-CM | POA: Diagnosis not present

## 2022-12-17 DIAGNOSIS — M80052D Age-related osteoporosis with current pathological fracture, left femur, subsequent encounter for fracture with routine healing: Secondary | ICD-10-CM | POA: Diagnosis not present

## 2022-12-17 DIAGNOSIS — M4856XD Collapsed vertebra, not elsewhere classified, lumbar region, subsequent encounter for fracture with routine healing: Secondary | ICD-10-CM | POA: Diagnosis not present

## 2022-12-17 DIAGNOSIS — I700 Atherosclerosis of aorta: Secondary | ICD-10-CM | POA: Diagnosis not present

## 2022-12-17 DIAGNOSIS — M800B2D Age-related osteoporosis with current pathological fracture, left pelvis, subsequent encounter for fracture with routine healing: Secondary | ICD-10-CM | POA: Diagnosis not present

## 2022-12-20 DIAGNOSIS — I2510 Atherosclerotic heart disease of native coronary artery without angina pectoris: Secondary | ICD-10-CM | POA: Diagnosis not present

## 2022-12-20 DIAGNOSIS — M80052D Age-related osteoporosis with current pathological fracture, left femur, subsequent encounter for fracture with routine healing: Secondary | ICD-10-CM | POA: Diagnosis not present

## 2022-12-20 DIAGNOSIS — M800B2D Age-related osteoporosis with current pathological fracture, left pelvis, subsequent encounter for fracture with routine healing: Secondary | ICD-10-CM | POA: Diagnosis not present

## 2022-12-20 DIAGNOSIS — M4854XD Collapsed vertebra, not elsewhere classified, thoracic region, subsequent encounter for fracture with routine healing: Secondary | ICD-10-CM | POA: Diagnosis not present

## 2022-12-20 DIAGNOSIS — R419 Unspecified symptoms and signs involving cognitive functions and awareness: Secondary | ICD-10-CM | POA: Diagnosis not present

## 2022-12-20 DIAGNOSIS — I700 Atherosclerosis of aorta: Secondary | ICD-10-CM | POA: Diagnosis not present

## 2022-12-20 DIAGNOSIS — I10 Essential (primary) hypertension: Secondary | ICD-10-CM | POA: Diagnosis not present

## 2022-12-20 DIAGNOSIS — M4856XD Collapsed vertebra, not elsewhere classified, lumbar region, subsequent encounter for fracture with routine healing: Secondary | ICD-10-CM | POA: Diagnosis not present

## 2022-12-20 DIAGNOSIS — G20A1 Parkinson's disease without dyskinesia, without mention of fluctuations: Secondary | ICD-10-CM | POA: Diagnosis not present

## 2022-12-20 NOTE — Progress Notes (Unsigned)
Assessment/Plan:    1.  Parkinson's disease  -Patient was diagnosed in 2017, but was on very low-dose of medication until very recently.  Motor sx's are actually very mild.  She was seen today and hadn't taken med for 12 hours and only had mild rigidity.  -Disease is now complicated by confusion, falls, memory change  -pt not really ambulatory much anymore, so I think it is okay to reduce the levodopa.  In addition, she really is not that rigid even off of levodopa.  I am also going to change the formulation so that she takes the extended release.  We will also have her move dosages closer together.  She will stop the immediate release formulation.  She will take carbidopa/levodopa 25/100 CR, 2 tablets at 9 AM, 1 tablet at 1 PM, 1 tablet at 5 PM.  -Discussed with patient that she really needs to be off of the daytime clonazepam.  This is only making her higher risk for confusion and falls.   2.  Memory change/confusion/PDD with hallucinations  -Dr. Marjory Lies has recently d/c the pramipexole and I agree with this  -likely degree of PDD but recent falls with sx have likely compounded the memory issues and confusion  -would like to see the daytime klonopin be d/c.  This increases risk for confusion/falls/hallucinations.  I really do not have any objection to the nighttime dosage.  -She is on Nuplazid, 34 mg daily.  That has certainly helped.  -Patient with fairly significant tactile hallucinations, which manifest themselves as oral cenesthopathy (staples and thread in mouth).  As above, backing down on the levodopa and changing formulations.  Discussed with patient and daughter that if this did not help, we could add back on low-dose quetiapine.  However, discussed the risks of doing this, including using 2 antipsychotics.  Discussed prolongation of QT interval.  Discussed the black box warning.  Discussed extensively the risk, benefits, side effects.  They are willing to consider  this.  -discussed adult day care and information given on community resources  -Discussed level of care.  She is currently full code.  3.  Chronic LBP  -follows with ortho/neurosx in past  4.  Depression  -was following with Dr. Evelene Croon for long-term OCD and anxiety and recently changed to [facility], Yvette Rack Subjective:   Patricia Fitzpatrick was seen today in the movement disorders clinic for neurologic consultation at the request of [name], MD.  The consultation is for second opinion regarding Parkinson's disease.  Patient has been under the care of [facility] for many years and the notes that are available to me are reviewed.  Patient started seeing Dr. Anne Hahn in August, 2017.  He noted that patient had tremors for 2 years prior to that.  On his initial examination, he noted no rest tremor, "minimal action type tremor in the upper extremities, right equal to the left."  He noted what sounds like facial bradykinesia hold and felt that the patient had parkinsonism.  He started her on pramipexole in December, 2017 at low-dose (0.25 mg 3 times per day).  Levodopa was started in October, 2018 and she was only on half tablet 3 times per day for several years.  In late 2020, she was started on quetiapine for hallucinations, while still remaining on pramipexole.  Dr. Anne Hahn retired in 2022 and she started to see Everlene Other in 2023 and subsequently Dr. Marjory Lies as of March, 2024.  Dr. Marjory Lies her off of pramipexole in March,  2024 because of memory change and hallucinations.  They report that the quietiapine was d/c when the pramipexole was d/c.  She was in the hospital in July and August because of weakness and falls.  She had sustained a pubic ramus fracture requiring nonoperative management.   She underwent kyphoplasty for numerous thoracic/lumbar wedge fractures.  She went to SNF for rehab.  Daughter states that psych consult was obtained and klonopin was added.    Records indicate that  she was discharged from SNF but has been back to the emergency room several times. Upon d/c from SNF, she went to [facility] and they didn't want her on klonopin and they started the nuplazid and that has helped.  They report that they tried to wean her off of the klonopin x 2 and tremors got worse.  They are mostly using it at night but if "ramped up" they give her 1/2 tablet in the day as well.   Emergency room records indicate that she needs outpatient rehab, but has apparently declined that because of cost.  They called Dr. Richrd Humbles office and he increased her levodopa from 1.5 tablets 3 times per day to 2 tablets 3 times per day.    Separately from the parkinsonism, the patient has been treated for years for depression/OCD (prior to even seeing Dr. Anne Hahn) by Dr. Evelene Croon.  She has also been treated for many years for chronic pain/low back pain (prior to falls/compression fractures).  She has seen orthopedics as well as neurosurgery/Dr. Venetia Maxon.  This is in addition to the fact that she is now seeing neurosurgeon and interventional radiology because of more recent compression fractures and hip fracture.  Current movement disorder medications:  Carbidopa/levodopa 25/100, 2 tablets 3 times per day (just increased by Dr. Marjory Lies on November 18, 2022) - currently ordered at 9am/3pm/9pm (10pm is bed) Nuplazid, 34 mg daily (just started September, 2024)  Pt lives at home now with brother/daughter and has 24 hour caregivers.  They would like facility placement but doesn't qualify for MCD currently.  Daughter talking to elder care.  Specific Symptoms:  Tremor: Yes.  She has tremor but she will have full body movements and will flail some that will be driven by anxiety Voice: yes, they don't think doing ST now Sleep: sleeps well  Vivid Dreams:  Yes.    Acting out dreams:  No. Wet Pillows: No., but she has psychosis that she believes that something is in the mouth when it is not.   Postural symptoms:  don't  walk a lot now since SNF.  She only walks now with someone with her and holding on with walker/gait belt.  She has bedside commode.  She does sponge bath.   Loss of smell:  Yes.   Loss of taste:  No. Urinary Incontinence:  Yes.   Difficulty Swallowing:  No. Trouble with ADL's:  Yes.    Trouble buttoning clothing: Yes.   Memory changes:  Yes.   Hallucinations:  Yes.   Better than prior to nuplazid but still present.  Now with belief that something is in mouth and obsessive about this.  "I have metal and thread in my mouth."  She also still has daytime hallucinations now but is less aggressive.  Still has obsessiveness. N/V:  No. Lightheaded:  No.  Syncope: No. Diplopia:  occasionally  Most recent MRI brain done August 31, 2022.  This demonstrated mild small vessel disease.  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  PREVIOUS MEDICATIONS: carbidopa/levodopa; pramipexole; quetiapine  ALLERGIES:  No Known Allergies  CURRENT MEDICATIONS:  Current Meds  Medication Sig   acetaminophen (TYLENOL) 500 MG tablet Take 2 tablets (1,000 mg total) by mouth 3 (three) times daily. (Patient taking differently: Take 1,000 mg by mouth every 8 (eight) hours as needed for mild pain (pain score 1-3) or moderate pain (pain score 4-6).)   amLODipine (NORVASC) 5 MG tablet Take 1 tablet (5 mg total) by mouth daily.   clonazePAM (KLONOPIN) 0.5 MG tablet Take 1 tablet (0.5 mg total) by mouth 2 (two) times daily. 1/2 tab in the morning, 1 tab at bedtime daily (Patient taking differently: Take 0.5 mg by mouth at bedtime.)   denosumab (PROLIA) 60 MG/ML SOSY injection Inject 60 mg into the skin every 6 (six) months.   FLUoxetine (PROZAC) 40 MG capsule Take 40 mg by mouth 2 (two) times daily.   MELOXICAM PO Take 1 tablet by mouth daily as needed (with tylenol).   Pimavanserin Tartrate (NUPLAZID) 34 MG CAPS Take 1 capsule (34 mg total) by mouth daily.   [DISCONTINUED] Carbidopa-Levodopa ER (SINEMET CR)  25-100 MG tablet controlled release Take 2 at 9AM / Take 1 at 1PM and take 1 at 5 pm     Objective:   VITALS:   Vitals:   12/22/22 0848  BP: 116/70  Pulse: 69  SpO2: 95%  Weight: 93 lb 0.6 oz (42.2 kg)  Height: 5' (1.524 m)   Pt did not take any carbidopa/levodopa today.  It was last taken at 9pm yesterday.  GEN:  The patient appears stated age and is in NAD. HEENT:  Normocephalic, atraumatic.  The mucous membranes are moist. The superficial temporal arteries are without ropiness or tenderness. CV:  RRR Lungs:  CTAB Neck/HEME:  There are no carotid bruits bilaterally.  Neurological examination:  Orientation: The patient is alert and oriented to person/place.  She looks to daughter for finer aspect of the history. Cranial nerves: There is good facial symmetry. Extraocular muscles are intact. The visual fields are full to confrontational testing. The speech is fluent and clear. Soft palate rises symmetrically and there is no tongue deviation. Hearing is intact to conversational tone. Sensation: Sensation is intact to light and pinprick throughout (facial, trunk, extremities). Vibration is intact at the bilateral big toe. There is no extinction with double simultaneous stimulation. There is no sensory dermatomal level identified. Motor: Strength is 5/5 in the bilateral upper and lower extremities.   Shoulder shrug is equal and symmetric.  There is no pronator drift. Deep tendon reflexes: Deep tendon reflexes are 2+/4 at the bilateral biceps, triceps, brachioradialis, patella and achilles. Plantar responses are downgoing bilaterally.  Movement examination: Tone: There is mild increased tone in the RUE Abnormal movements: none Coordination:  There is no significant decremation with RAM's, with any form of RAMS, including alternating supination and pronation of the forearm, hand opening and closing, finger taps, heel taps and toe taps.  Gait and Station: The patient requires 2 person  assist out of the chair.  She has trouble standing on her own and even with a walker would need assistance to stand in the walker.  She does not ambulate with a walker. I have reviewed and interpreted the following labs independently   Chemistry      Component Value Date/Time   NA 136 12/04/2022 1117   K 4.4 12/04/2022 1117   CL 103 12/04/2022 1117   CO2 21 (L) 12/04/2022 1117   BUN 26 (  H) 12/04/2022 1117   CREATININE 0.72 12/04/2022 1117      Component Value Date/Time   CALCIUM 9.5 12/04/2022 1117   ALKPHOS 51 11/18/2022 1251   AST 19 11/18/2022 1251   ALT 8 11/18/2022 1251   BILITOT 0.5 11/18/2022 1251      Lab Results  Component Value Date   TSH 0.608 09/01/2022   Lab Results  Component Value Date   WBC 6.8 12/04/2022   HGB 15.0 12/04/2022   HCT 45.6 12/04/2022   MCV 95.0 12/04/2022   PLT 310 12/04/2022     Total time spent on today's visit was 100 minutes, including both face-to-face time and nonface-to-face time.  Time included that spent on review of records (prior notes available to me/labs/imaging if pertinent), discussing treatment and goals, answering patient's questions and coordinating care.  Cc:  Eloisa Northern, MD

## 2022-12-21 DIAGNOSIS — M80052D Age-related osteoporosis with current pathological fracture, left femur, subsequent encounter for fracture with routine healing: Secondary | ICD-10-CM | POA: Diagnosis not present

## 2022-12-21 DIAGNOSIS — I700 Atherosclerosis of aorta: Secondary | ICD-10-CM | POA: Diagnosis not present

## 2022-12-21 DIAGNOSIS — G20A1 Parkinson's disease without dyskinesia, without mention of fluctuations: Secondary | ICD-10-CM | POA: Diagnosis not present

## 2022-12-21 DIAGNOSIS — I10 Essential (primary) hypertension: Secondary | ICD-10-CM | POA: Diagnosis not present

## 2022-12-21 DIAGNOSIS — I2510 Atherosclerotic heart disease of native coronary artery without angina pectoris: Secondary | ICD-10-CM | POA: Diagnosis not present

## 2022-12-21 DIAGNOSIS — M800B2D Age-related osteoporosis with current pathological fracture, left pelvis, subsequent encounter for fracture with routine healing: Secondary | ICD-10-CM | POA: Diagnosis not present

## 2022-12-21 DIAGNOSIS — R419 Unspecified symptoms and signs involving cognitive functions and awareness: Secondary | ICD-10-CM | POA: Diagnosis not present

## 2022-12-21 DIAGNOSIS — M4856XD Collapsed vertebra, not elsewhere classified, lumbar region, subsequent encounter for fracture with routine healing: Secondary | ICD-10-CM | POA: Diagnosis not present

## 2022-12-21 DIAGNOSIS — M4854XD Collapsed vertebra, not elsewhere classified, thoracic region, subsequent encounter for fracture with routine healing: Secondary | ICD-10-CM | POA: Diagnosis not present

## 2022-12-22 ENCOUNTER — Encounter: Payer: Self-pay | Admitting: Neurology

## 2022-12-22 ENCOUNTER — Other Ambulatory Visit: Payer: Self-pay

## 2022-12-22 ENCOUNTER — Ambulatory Visit (INDEPENDENT_AMBULATORY_CARE_PROVIDER_SITE_OTHER): Payer: Medicare PPO | Admitting: Neurology

## 2022-12-22 VITALS — BP 116/70 | HR 69 | Ht 60.0 in | Wt 93.0 lb

## 2022-12-22 DIAGNOSIS — G20A1 Parkinson's disease without dyskinesia, without mention of fluctuations: Secondary | ICD-10-CM

## 2022-12-22 DIAGNOSIS — F02B0 Dementia in other diseases classified elsewhere, moderate, without behavioral disturbance, psychotic disturbance, mood disturbance, and anxiety: Secondary | ICD-10-CM

## 2022-12-22 DIAGNOSIS — G8929 Other chronic pain: Secondary | ICD-10-CM

## 2022-12-22 DIAGNOSIS — G20C Parkinsonism, unspecified: Secondary | ICD-10-CM

## 2022-12-22 DIAGNOSIS — M545 Low back pain, unspecified: Secondary | ICD-10-CM | POA: Diagnosis not present

## 2022-12-22 MED ORDER — CARBIDOPA-LEVODOPA ER 25-100 MG PO TBCR: EXTENDED_RELEASE_TABLET | ORAL | 0 refills | Status: DC

## 2022-12-22 MED ORDER — CARBIDOPA-LEVODOPA ER 25-100 MG PO TBCR: EXTENDED_RELEASE_TABLET | ORAL | Status: DC

## 2022-12-22 NOTE — Patient Instructions (Signed)
I would recommend:  Hold daytime klonopin (talk to your prescriber about this) STOP current carbidopa/levodopa  START carbidopa/levodopa 25/100 CR, 2 at 9am, 1 at 1pm, 1 at 5pm Continue nuplazid  The physicians and staff at [facility] are committed to providing excellent care. You may receive a survey requesting feedback about your experience at our office. We strive to receive "very good" responses to the survey questions. If you feel that your experience would prevent you from giving the office a "very good " response, please contact our office to try to remedy the situation. We may be reached at [phone]. Thank you for taking the time out of your busy day to complete the survey.

## 2022-12-23 DIAGNOSIS — I10 Essential (primary) hypertension: Secondary | ICD-10-CM | POA: Diagnosis not present

## 2022-12-23 DIAGNOSIS — R419 Unspecified symptoms and signs involving cognitive functions and awareness: Secondary | ICD-10-CM | POA: Diagnosis not present

## 2022-12-23 DIAGNOSIS — M4854XD Collapsed vertebra, not elsewhere classified, thoracic region, subsequent encounter for fracture with routine healing: Secondary | ICD-10-CM | POA: Diagnosis not present

## 2022-12-23 DIAGNOSIS — M4856XD Collapsed vertebra, not elsewhere classified, lumbar region, subsequent encounter for fracture with routine healing: Secondary | ICD-10-CM | POA: Diagnosis not present

## 2022-12-23 DIAGNOSIS — I2510 Atherosclerotic heart disease of native coronary artery without angina pectoris: Secondary | ICD-10-CM | POA: Diagnosis not present

## 2022-12-23 DIAGNOSIS — M800B2D Age-related osteoporosis with current pathological fracture, left pelvis, subsequent encounter for fracture with routine healing: Secondary | ICD-10-CM | POA: Diagnosis not present

## 2022-12-23 DIAGNOSIS — I700 Atherosclerosis of aorta: Secondary | ICD-10-CM | POA: Diagnosis not present

## 2022-12-23 DIAGNOSIS — G20A1 Parkinson's disease without dyskinesia, without mention of fluctuations: Secondary | ICD-10-CM | POA: Diagnosis not present

## 2022-12-23 DIAGNOSIS — M80052D Age-related osteoporosis with current pathological fracture, left femur, subsequent encounter for fracture with routine healing: Secondary | ICD-10-CM | POA: Diagnosis not present

## 2022-12-27 DIAGNOSIS — G20A1 Parkinson's disease without dyskinesia, without mention of fluctuations: Secondary | ICD-10-CM | POA: Diagnosis not present

## 2022-12-27 DIAGNOSIS — I2510 Atherosclerotic heart disease of native coronary artery without angina pectoris: Secondary | ICD-10-CM | POA: Diagnosis not present

## 2022-12-27 DIAGNOSIS — I10 Essential (primary) hypertension: Secondary | ICD-10-CM | POA: Diagnosis not present

## 2022-12-27 DIAGNOSIS — M800B2D Age-related osteoporosis with current pathological fracture, left pelvis, subsequent encounter for fracture with routine healing: Secondary | ICD-10-CM | POA: Diagnosis not present

## 2022-12-27 DIAGNOSIS — M4856XD Collapsed vertebra, not elsewhere classified, lumbar region, subsequent encounter for fracture with routine healing: Secondary | ICD-10-CM | POA: Diagnosis not present

## 2022-12-27 DIAGNOSIS — M80052D Age-related osteoporosis with current pathological fracture, left femur, subsequent encounter for fracture with routine healing: Secondary | ICD-10-CM | POA: Diagnosis not present

## 2022-12-27 DIAGNOSIS — M4854XD Collapsed vertebra, not elsewhere classified, thoracic region, subsequent encounter for fracture with routine healing: Secondary | ICD-10-CM | POA: Diagnosis not present

## 2022-12-27 DIAGNOSIS — R419 Unspecified symptoms and signs involving cognitive functions and awareness: Secondary | ICD-10-CM | POA: Diagnosis not present

## 2022-12-27 DIAGNOSIS — I700 Atherosclerosis of aorta: Secondary | ICD-10-CM | POA: Diagnosis not present

## 2022-12-28 DIAGNOSIS — I2510 Atherosclerotic heart disease of native coronary artery without angina pectoris: Secondary | ICD-10-CM | POA: Diagnosis not present

## 2022-12-28 DIAGNOSIS — M800B2D Age-related osteoporosis with current pathological fracture, left pelvis, subsequent encounter for fracture with routine healing: Secondary | ICD-10-CM | POA: Diagnosis not present

## 2022-12-28 DIAGNOSIS — M80052D Age-related osteoporosis with current pathological fracture, left femur, subsequent encounter for fracture with routine healing: Secondary | ICD-10-CM | POA: Diagnosis not present

## 2022-12-28 DIAGNOSIS — I700 Atherosclerosis of aorta: Secondary | ICD-10-CM | POA: Diagnosis not present

## 2022-12-28 DIAGNOSIS — M4856XD Collapsed vertebra, not elsewhere classified, lumbar region, subsequent encounter for fracture with routine healing: Secondary | ICD-10-CM | POA: Diagnosis not present

## 2022-12-28 DIAGNOSIS — G20A1 Parkinson's disease without dyskinesia, without mention of fluctuations: Secondary | ICD-10-CM | POA: Diagnosis not present

## 2022-12-28 DIAGNOSIS — R419 Unspecified symptoms and signs involving cognitive functions and awareness: Secondary | ICD-10-CM | POA: Diagnosis not present

## 2022-12-28 DIAGNOSIS — M4854XD Collapsed vertebra, not elsewhere classified, thoracic region, subsequent encounter for fracture with routine healing: Secondary | ICD-10-CM | POA: Diagnosis not present

## 2022-12-28 DIAGNOSIS — I10 Essential (primary) hypertension: Secondary | ICD-10-CM | POA: Diagnosis not present

## 2022-12-31 DIAGNOSIS — I10 Essential (primary) hypertension: Secondary | ICD-10-CM | POA: Diagnosis not present

## 2022-12-31 DIAGNOSIS — M4856XD Collapsed vertebra, not elsewhere classified, lumbar region, subsequent encounter for fracture with routine healing: Secondary | ICD-10-CM | POA: Diagnosis not present

## 2022-12-31 DIAGNOSIS — I2510 Atherosclerotic heart disease of native coronary artery without angina pectoris: Secondary | ICD-10-CM | POA: Diagnosis not present

## 2022-12-31 DIAGNOSIS — M80052D Age-related osteoporosis with current pathological fracture, left femur, subsequent encounter for fracture with routine healing: Secondary | ICD-10-CM | POA: Diagnosis not present

## 2022-12-31 DIAGNOSIS — R419 Unspecified symptoms and signs involving cognitive functions and awareness: Secondary | ICD-10-CM | POA: Diagnosis not present

## 2022-12-31 DIAGNOSIS — M800B2D Age-related osteoporosis with current pathological fracture, left pelvis, subsequent encounter for fracture with routine healing: Secondary | ICD-10-CM | POA: Diagnosis not present

## 2022-12-31 DIAGNOSIS — G20A1 Parkinson's disease without dyskinesia, without mention of fluctuations: Secondary | ICD-10-CM | POA: Diagnosis not present

## 2022-12-31 DIAGNOSIS — I700 Atherosclerosis of aorta: Secondary | ICD-10-CM | POA: Diagnosis not present

## 2022-12-31 DIAGNOSIS — M4854XD Collapsed vertebra, not elsewhere classified, thoracic region, subsequent encounter for fracture with routine healing: Secondary | ICD-10-CM | POA: Diagnosis not present

## 2023-01-02 ENCOUNTER — Encounter: Payer: Self-pay | Admitting: Neurology

## 2023-01-04 ENCOUNTER — Telehealth: Payer: Self-pay

## 2023-01-04 NOTE — Telephone Encounter (Signed)
Called patient and let her know that they need to go back to [facility] and patients daughter said they haven't seen them when it is not true they saw them in August patients daughter upset saying they will have to deal with it

## 2023-01-05 DIAGNOSIS — R2689 Other abnormalities of gait and mobility: Secondary | ICD-10-CM | POA: Diagnosis not present

## 2023-01-07 ENCOUNTER — Emergency Department (HOSPITAL_COMMUNITY): Payer: Medicare PPO

## 2023-01-07 ENCOUNTER — Emergency Department (HOSPITAL_COMMUNITY)
Admission: EM | Admit: 2023-01-07 | Discharge: 2023-01-07 | Disposition: A | Discharge status: Home / Self Care | Payer: Medicare PPO | Attending: Student | Admitting: Student

## 2023-01-07 ENCOUNTER — Other Ambulatory Visit: Payer: Self-pay

## 2023-01-07 ENCOUNTER — Encounter (HOSPITAL_COMMUNITY): Payer: Self-pay

## 2023-01-07 DIAGNOSIS — S5011XA Contusion of right forearm, initial encounter: Secondary | ICD-10-CM | POA: Diagnosis not present

## 2023-01-07 DIAGNOSIS — M79631 Pain in right forearm: Secondary | ICD-10-CM | POA: Diagnosis present

## 2023-01-07 DIAGNOSIS — I10 Essential (primary) hypertension: Secondary | ICD-10-CM | POA: Diagnosis not present

## 2023-01-07 DIAGNOSIS — S32512A Fracture of superior rim of left pubis, initial encounter for closed fracture: Secondary | ICD-10-CM | POA: Diagnosis not present

## 2023-01-07 DIAGNOSIS — G20C Parkinsonism, unspecified: Secondary | ICD-10-CM | POA: Insufficient documentation

## 2023-01-07 DIAGNOSIS — W06XXXA Fall from bed, initial encounter: Secondary | ICD-10-CM | POA: Insufficient documentation

## 2023-01-07 DIAGNOSIS — M549 Dorsalgia, unspecified: Secondary | ICD-10-CM | POA: Diagnosis not present

## 2023-01-07 DIAGNOSIS — Z043 Encounter for examination and observation following other accident: Secondary | ICD-10-CM | POA: Diagnosis not present

## 2023-01-07 DIAGNOSIS — M542 Cervicalgia: Secondary | ICD-10-CM | POA: Diagnosis not present

## 2023-01-07 DIAGNOSIS — Z96642 Presence of left artificial hip joint: Secondary | ICD-10-CM | POA: Diagnosis not present

## 2023-01-07 DIAGNOSIS — G319 Degenerative disease of nervous system, unspecified: Secondary | ICD-10-CM | POA: Diagnosis not present

## 2023-01-07 DIAGNOSIS — S0990XA Unspecified injury of head, initial encounter: Secondary | ICD-10-CM | POA: Diagnosis not present

## 2023-01-07 DIAGNOSIS — W19XXXA Unspecified fall, initial encounter: Secondary | ICD-10-CM | POA: Diagnosis not present

## 2023-01-07 DIAGNOSIS — S199XXA Unspecified injury of neck, initial encounter: Secondary | ICD-10-CM | POA: Diagnosis not present

## 2023-01-07 DIAGNOSIS — Z471 Aftercare following joint replacement surgery: Secondary | ICD-10-CM | POA: Diagnosis not present

## 2023-01-07 DIAGNOSIS — I6782 Cerebral ischemia: Secondary | ICD-10-CM | POA: Diagnosis not present

## 2023-01-07 LAB — CBC WITH DIFFERENTIAL/PLATELET
Abs Immature Granulocytes: 0.02 10*3/uL (ref 0.00–0.07)
Basophils Absolute: 0 10*3/uL (ref 0.0–0.1)
Basophils Relative: 1 %
Eosinophils Absolute: 0 10*3/uL (ref 0.0–0.5)
Eosinophils Relative: 1 %
HCT: 40 % (ref 36.0–46.0)
Hemoglobin: 13.2 g/dL (ref 12.0–15.0)
Immature Granulocytes: 0 %
Lymphocytes Relative: 12 %
Lymphs Abs: 0.7 10*3/uL (ref 0.7–4.0)
MCH: 31.4 pg (ref 26.0–34.0)
MCHC: 33 g/dL (ref 30.0–36.0)
MCV: 95.2 fL (ref 80.0–100.0)
Monocytes Absolute: 0.5 10*3/uL (ref 0.1–1.0)
Monocytes Relative: 8 %
Neutro Abs: 4.7 10*3/uL (ref 1.7–7.7)
Neutrophils Relative %: 78 %
Platelets: 301 10*3/uL (ref 150–400)
RBC: 4.2 MIL/uL (ref 3.87–5.11)
RDW: 12.3 % (ref 11.5–15.5)
WBC: 6 10*3/uL (ref 4.0–10.5)
nRBC: 0 % (ref 0.0–0.2)

## 2023-01-07 LAB — URINALYSIS, W/ REFLEX TO CULTURE (INFECTION SUSPECTED)
Bacteria, UA: NONE SEEN
Bilirubin Urine: NEGATIVE
Glucose, UA: NEGATIVE mg/dL
Hgb urine dipstick: NEGATIVE
Ketones, ur: 5 mg/dL — AB
Leukocytes,Ua: NEGATIVE
Nitrite: NEGATIVE
Protein, ur: NEGATIVE mg/dL
Specific Gravity, Urine: 1.018 (ref 1.005–1.030)
pH: 5 (ref 5.0–8.0)

## 2023-01-07 LAB — COMPREHENSIVE METABOLIC PANEL
ALT: 12 U/L (ref 0–44)
AST: 19 U/L (ref 15–41)
Albumin: 3.2 g/dL — ABNORMAL LOW (ref 3.5–5.0)
Alkaline Phosphatase: 60 U/L (ref 38–126)
Anion gap: 9 (ref 5–15)
BUN: 23 mg/dL (ref 8–23)
CO2: 26 mmol/L (ref 22–32)
Calcium: 8.8 mg/dL — ABNORMAL LOW (ref 8.9–10.3)
Chloride: 105 mmol/L (ref 98–111)
Creatinine, Ser: 0.73 mg/dL (ref 0.44–1.00)
GFR, Estimated: >60 mL/min (ref >60)
Glucose, Bld: 89 mg/dL (ref 70–99)
Potassium: 3.9 mmol/L (ref 3.5–5.1)
Sodium: 140 mmol/L (ref 135–145)
Total Bilirubin: 0.7 mg/dL (ref <1.2)
Total Protein: 5.9 g/dL — ABNORMAL LOW (ref 6.5–8.1)

## 2023-01-07 LAB — CK: Total CK: 42 U/L (ref 38–234)

## 2023-01-07 MED ORDER — CARBIDOPA-LEVODOPA ER 25-100 MG PO TBCR
1.0000 | EXTENDED_RELEASE_TABLET | Freq: Three times a day (TID) | ORAL | Status: DC
  Filled 2023-01-07 (×2): qty 1

## 2023-01-07 MED ORDER — METHOCARBAMOL 500 MG PO TABS: 500.0000 mg | ORAL_TABLET | Freq: Three times a day (TID) | ORAL | Status: DC | PRN

## 2023-01-07 MED ORDER — OXYCODONE HCL 5 MG PO TABS: 5.0000 mg | ORAL_TABLET | ORAL | Status: DC | PRN

## 2023-01-07 NOTE — ED Triage Notes (Addendum)
PT BIB EMS for falling out of the bed, complains of some neck and back pain, was found in the floor by family this morning, per family patient is at baseline, pt has hx of Parkinson's, wheelchair bound. C-collar in place.   154/70 Hr 60 Resp 18 O2 97 RA CBG 95

## 2023-01-07 NOTE — Discharge Instructions (Signed)
CT scans and x-rays did not show any new injuries today.  The x-ray of her hips, did show some old fractures, which were seen in August of this year.  Her blood work was reassuring and without worrisome abnormalities.  She did not have a urinary tract infection based on her urine sample.  Please return to the ER if Patricia Fitzpatrick is excessively sleepy, more confused than normal, has significant behavior changes, or is demonstrating any other behaviors that you are concerned about.

## 2023-01-07 NOTE — ED Notes (Signed)
Patients family request to speak to provider, does not want to take the patient home at this time.

## 2023-01-07 NOTE — ED Provider Notes (Addendum)
[place] EMERGENCY DEPARTMENT AT [facility] Provider Note   CSN: [id number] Arrival date & time: 01/07/23  0844     History  Chief Complaint  Patient presents with   Marletta Lor    Patricia Fitzpatrick is a 77 y.o. female.  77 year old female with past medical history of chronic deconditioning, Parkinson's, presenting here after reported fall from bed.  The patient's stepdaughter, who is her caregiver, provides the history.  The patient stepdaughter states that she last saw the patient in bed at 11 PM.  She found her on the floor at approximately 7 AM this morning.  She is only able to ambulate with assistance.  The patient states that she got out of bed to use the bathroom.  She does not think she hit her head.  She is complaining of proximal right upper extremity pain.  Denies chest pain, abdominal pain, neck pain, back pain.  Patient is not on anticoagulation. Patient stepdaughter states that the patient is more somnolent this morning than is typical for her.  She has not had any recent infectious symptoms.  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  Patient stepdaughter states this seemed to be worse yesterday.  The history is provided by the patient, the EMS personnel and a caregiver.       Home Medications Prior to Admission medications   Medication Sig Start Date End Date Taking? Authorizing Provider  acetaminophen (TYLENOL) 500 MG tablet Take 2 tablets (1,000 mg total) by mouth 3 (three) times daily. Patient taking differently: Take 1,000 mg by mouth every 8 (eight) hours as needed for mild pain (pain score 1-3) or moderate pain (pain score 4-6). 09/03/22  Yes Berton Mount I, MD  amLODipine (NORVASC) 5 MG tablet Take 1 tablet (5 mg total) by mouth daily. 09/03/22  Yes Barnetta Chapel, MD  Carbidopa-Levodopa ER (SINEMET CR) 25-100 MG tablet controlled release Take 2 at 9AM / Take 1 at 1PM and take 1 at 5 pm 12/22/22  Yes Tat, Octaviano Batty, DO  FLUoxetine  (PROZAC) 40 MG capsule Take 40 mg by mouth 2 (two) times daily.   Yes [provider]  MELOXICAM PO Take 1 tablet by mouth daily as needed (with tylenol).   Yes [provider]  methocarbamol (ROBAXIN) 500 MG tablet Take 1 tablet (500 mg total) by mouth every 8 (eight) hours as needed for muscle spasms. 09/03/22  Yes Berton Mount I, MD  oxyCODONE (OXY IR/ROXICODONE) 5 MG immediate release tablet Take 1 tablet (5 mg total) by mouth every 4 (four) hours as needed for severe pain. 10/06/22  Yes Pokhrel, Laxman, MD  Pimavanserin Tartrate (NUPLAZID) 34 MG CAPS Take 1 capsule (34 mg total) by mouth daily. 10/26/22  Yes Penumalli, Glenford Bayley, MD      Allergies    Patient has no known allergies.    Review of Systems   As noted in HPI  Physical Exam Updated Vital Signs BP 118/71   Pulse 74   Temp 98.9 F (37.2 C) (Oral)   Resp 16   SpO2 95%  Physical Exam Vitals reviewed.  Constitutional:      General: She is awake. She is not in acute distress.    Appearance: She is underweight. She is not ill-appearing, toxic-appearing or diaphoretic.  HENT:     Head: Normocephalic and atraumatic.     Nose: Nose normal.  Eyes:     Conjunctiva/sclera: Conjunctivae normal.     Pupils: Pupils are equal, round,  and reactive to light.  Cardiovascular:     Rate and Rhythm: Normal rate and regular rhythm.     Pulses:          Radial pulses are 2+ on the right side and 2+ on the left side.       Dorsalis pedis pulses are 1+ on the right side and 2+ on the left side.     Heart sounds: Normal heart sounds. No murmur heard.    No friction rub. No gallop.  Pulmonary:     Effort: Pulmonary effort is normal. No respiratory distress.     Breath sounds: Normal breath sounds. No wheezing, rhonchi or rales.  Abdominal:     General: There is no distension.     Palpations: Abdomen is soft.     Tenderness: There is no abdominal tenderness. There is no guarding or rebound.  Musculoskeletal:      Right lower leg: No edema.     Left lower leg: No edema.     Comments: No tenderness to palpation of bilateral upper or lower extremities.  No tenderness palpation of midline C-spine, T-spine, or L-spine.  Skin:    General: Skin is warm and dry.     Findings: Bruising (To right forearm, that appears remote) present.  Psychiatric:        Behavior: Behavior is cooperative.     ED Results / Procedures / Treatments   Labs (all labs ordered are listed, but only abnormal results are displayed) Labs Reviewed  COMPREHENSIVE METABOLIC PANEL - Abnormal; Notable for the following components:      Result Value   Calcium 8.8 (*)    Total Protein 5.9 (*)    Albumin 3.2 (*)    All other components within normal limits  URINALYSIS, W/ REFLEX TO CULTURE (INFECTION SUSPECTED) - Abnormal; Notable for the following components:   Ketones, ur 5 (*)    All other components within normal limits  CBC WITH DIFFERENTIAL/PLATELET  CK    EKG None  Radiology CT Head Wo Contrast  Result Date: 01/07/2023 CLINICAL DATA:  Head trauma, minor (Age >= 65y); Neck trauma (Age >= 65y). EXAM: CT HEAD WITHOUT CONTRAST CT CERVICAL SPINE WITHOUT CONTRAST TECHNIQUE: Multidetector CT imaging of the head and cervical spine was performed following the standard protocol without intravenous contrast. Multiplanar CT image reconstructions of the cervical spine were also generated. RADIATION DOSE REDUCTION: This exam was performed according to the departmental dose-optimization program which includes automated exposure control, adjustment of the mA and/or kV according to patient size and/or use of iterative reconstruction technique. COMPARISON:  CT head and cervical spine 09/29/2022. MRI head 08/31/2022. FINDINGS: CT HEAD FINDINGS Brain: There is no evidence of an acute infarct, intracranial hemorrhage, mass, midline shift, or extra-axial fluid collection. There is mild cerebral atrophy. Cerebral white matter hypodensities are  unchanged and nonspecific but compatible with mild chronic small vessel ischemic disease, most notable in the left corona radiata. Vascular: Calcified atherosclerosis at the skull base. No hyperdense vessel. Skull: No acute fracture or suspicious osseous lesion. Sinuses/Orbits: Mild mucosal thickening in the left maxillary sinus. Clear mastoid air cells. Right cataract extraction. Other: None. CT CERVICAL SPINE FINDINGS Alignment: Mild left convex cervicothoracic curvature. No significant listhesis. Skull base and vertebrae: No acute fracture or suspicious osseous lesion. Soft tissues and spinal canal: No prevertebral fluid or swelling. No visible canal hematoma. Disc levels: Similar appearance of moderate lower cervical disc degeneration. Upper chest: Clear lung apices. Other: None. IMPRESSION: 1. No  evidence of acute intracranial abnormality or cervical spine fracture. 2. Mild chronic small vessel ischemic disease. Electronically Signed   By: Sebastian Ache M.D.   On: 01/07/2023 12:37   CT Cervical Spine Wo Contrast  Result Date: 01/07/2023 CLINICAL DATA:  Head trauma, minor (Age >= 65y); Neck trauma (Age >= 65y). EXAM: CT HEAD WITHOUT CONTRAST CT CERVICAL SPINE WITHOUT CONTRAST TECHNIQUE: Multidetector CT imaging of the head and cervical spine was performed following the standard protocol without intravenous contrast. Multiplanar CT image reconstructions of the cervical spine were also generated. RADIATION DOSE REDUCTION: This exam was performed according to the departmental dose-optimization program which includes automated exposure control, adjustment of the mA and/or kV according to patient size and/or use of iterative reconstruction technique. COMPARISON:  CT head and cervical spine 09/29/2022. MRI head 08/31/2022. FINDINGS: CT HEAD FINDINGS Brain: There is no evidence of an acute infarct, intracranial hemorrhage, mass, midline shift, or extra-axial fluid collection. There is mild cerebral atrophy.  Cerebral white matter hypodensities are unchanged and nonspecific but compatible with mild chronic small vessel ischemic disease, most notable in the left corona radiata. Vascular: Calcified atherosclerosis at the skull base. No hyperdense vessel. Skull: No acute fracture or suspicious osseous lesion. Sinuses/Orbits: Mild mucosal thickening in the left maxillary sinus. Clear mastoid air cells. Right cataract extraction. Other: None. CT CERVICAL SPINE FINDINGS Alignment: Mild left convex cervicothoracic curvature. No significant listhesis. Skull base and vertebrae: No acute fracture or suspicious osseous lesion. Soft tissues and spinal canal: No prevertebral fluid or swelling. No visible canal hematoma. Disc levels: Similar appearance of moderate lower cervical disc degeneration. Upper chest: Clear lung apices. Other: None. IMPRESSION: 1. No evidence of acute intracranial abnormality or cervical spine fracture. 2. Mild chronic small vessel ischemic disease. Electronically Signed   By: Sebastian Ache M.D.   On: 01/07/2023 12:37   DG Chest Portable 1 View  Result Date: 01/07/2023 CLINICAL DATA:  Larey Seat from bed EXAM: PORTABLE CHEST - 1 VIEW COMPARISON:  12/04/2022 FINDINGS: Lungs are clear.  No pneumothorax. Heart size and mediastinal contours are within normal limits. Aortic Atherosclerosis (ICD10-170.0). No effusion. Post L1 vertebral cement augmentation. Chronic T11 vertebra plana deformity. IMPRESSION: No acute cardiopulmonary disease. Electronically Signed   By: Corlis Leak M.D.   On: 01/07/2023 10:23   DG Shoulder Right  Result Date: 01/07/2023 CLINICAL DATA:  Fall from bed EXAM: RIGHT SHOULDER - 2 VIEW COMPARISON:  None Available. FINDINGS: There is no evidence of fracture or dislocation. There is no evidence of arthropathy or other focal bone abnormality. Soft tissues are unremarkable. IMPRESSION: No acute fracture or dislocation. Electronically Signed   By: Agustin Cree M.D.   On: 01/07/2023 10:20   DG  Pelvis Portable  Result Date: 01/07/2023 CLINICAL DATA:  Fall from bed. EXAM: PORTABLE PELVIS 1-2 VIEWS COMPARISON:  09/30/2022 FINDINGS: Status post left hip arthroplasty. Unchanged compared with previous exam. Chronic fracture deformities involving the left superior and inferior pubic rami are again seen. Remote right para symphyseal pubic bone fracture is also unchanged in the interval. No sign of acute fracture or dislocation. No signs of pelvic diastasis. There is a moderate stool burden identified within the rectum. IMPRESSION: 1. No acute findings. 2. Status post left hip arthroplasty. 3. Chronic fracture deformities involving the left superior and inferior pubic rami and right parasymphyseal pubic bone. Electronically Signed   By: Signa Kell M.D.   On: 01/07/2023 10:10    Procedures Procedures    Medications Ordered in ED Medications  Carbidopa-Levodopa ER (SINEMET CR) 25-100 MG tablet controlled release 1 tablet (1 tablet Oral Patient Refused/Not Given 01/07/23 1559)  oxyCODONE (Oxy IR/ROXICODONE) immediate release tablet 5 mg (has no administration in time range)  methocarbamol (ROBAXIN) tablet 500 mg (has no administration in time range)    ED Course/ Medical Decision Making/ A&P Clinical Course as of 01/07/23 1653  Fri Jan 07, 2023  1505 S- hx parkinsons, fell out of bed. Trauma imaging negative. Does not ambulate on her own at baseline -Fu UA, then discharge [HJ]  1652 ED EKG This rhythm.  Rate of 57.  Prolonged QRS of 111 ms.  No axis deviation.  T wave flattening in lead III.  No ST segment changes.  Nonischemic ECG. [JR]    Clinical Course User Index [HJ] Janyth Pupa, MD [JR] Rolla Flatten, MD                                 Medical Decision Making Amount and/or Complexity of Data Reviewed Independent Historian: caregiver Labs: ordered. Radiology: ordered and independent interpretation performed. ECG/medicine tests: ordered and independent interpretation  performed. Decision-making details documented in ED Course.  Risk Prescription drug management.   77 year old female presents here after reportedly falling from bed last night.  Was found on the floor at approximately 7 AM this morning by centimeter, who is her caregiver.  Per family, she is more somnolent this morning.  However, oriented x 3 here.  Vitals reassuring on presentation.  On exam, no gross deformities of the extremities noted.  She has no tenderness to palpation of chest wall, abdomen, extremities, midline spine.  Initial differential diagnosis includes intracranial hemorrhage, acute fracture, acute infection, electrolyte derangement, rhabdomyolysis.  Will obtain CT head and CT C-spine to evaluate for traumatic intracranial hemorrhage, acute fracture.  Will obtain chest x-ray to evaluate for any traumatic injuries, pneumothorax, or pneumonia.  Will also obtain x-rays of the pelvis and right shoulder to rule out traumatic injuries.  CMP, CBC, CK, UA ordered to evaluate for electrolyte abnormality, rhabdomyolysis, UTI.  I independently reviewed the patient's chest x-ray, right shoulder x-ray, pelvis x-ray.  No acute fractures noted.  Patient does have some chronic fracture deformities on her pelvis x-ray when compared to prior imaging.  No focal opacity on chest x-ray to suggest pneumonia.  No evidence of pneumothorax.  I independently reviewed the patient's CT imaging, which does not demonstrate any evidence of intracranial hemorrhage or acute fractures.  Laboratory workup reviewed.  CMP, CBC, CK unremarkable.  Workup not consistent with acute rhabdomyolysis.  Urinalysis not consistent with UTI.  I independently reviewed the patient's ECG, which is nonischemic.  Work up reassuring.  On reevaluation, patient is noted to be oriented.  She arouses easily to voice.  Is conversant with her stepdaughter.  Patient felt to be appropriate for discharge at this time.  Discharged in stable  condition.  Patient's presentation is most consistent with acute presentation with potential threat to life or bodily function.         Final Clinical Impression(s) / ED Diagnoses Final diagnoses:  Fall, initial encounter    Rx / DC Orders ED Discharge Orders     None         Rolla Flatten, MD 01/07/23 1653    Rolla Flatten, MD 01/07/23 1654    Rolla Flatten, MD 01/07/23 1655    Kommor, [place], MD 01/07/23 1911

## 2023-01-07 NOTE — ED Notes (Signed)
PTAR  Called 3 hours ETA

## 2023-01-10 ENCOUNTER — Telehealth: Payer: Self-pay | Admitting: Neurology

## 2023-01-10 NOTE — Telephone Encounter (Signed)
Pt's daughter called in and left a message with the access nurse on 01/09/23. 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 The pt is currently obsessed with something in her mouth that is not there.   Full access nurse report is in Dr. Don Perking box

## 2023-01-10 NOTE — Telephone Encounter (Signed)
Appointment is scheduled with [facility] for Monday Dec 2 at 8:20 AM this was the first available after the Thanksgiving holiday. Called [name] back and she said they are in the process of getting patient put into a facility and hope to have her in a facility by that point

## 2023-01-11 DIAGNOSIS — M4854XD Collapsed vertebra, not elsewhere classified, thoracic region, subsequent encounter for fracture with routine healing: Secondary | ICD-10-CM | POA: Diagnosis not present

## 2023-01-11 DIAGNOSIS — I10 Essential (primary) hypertension: Secondary | ICD-10-CM | POA: Diagnosis not present

## 2023-01-11 DIAGNOSIS — M800B2D Age-related osteoporosis with current pathological fracture, left pelvis, subsequent encounter for fracture with routine healing: Secondary | ICD-10-CM | POA: Diagnosis not present

## 2023-01-11 DIAGNOSIS — I700 Atherosclerosis of aorta: Secondary | ICD-10-CM | POA: Diagnosis not present

## 2023-01-11 DIAGNOSIS — G20A1 Parkinson's disease without dyskinesia, without mention of fluctuations: Secondary | ICD-10-CM | POA: Diagnosis not present

## 2023-01-11 DIAGNOSIS — M4856XD Collapsed vertebra, not elsewhere classified, lumbar region, subsequent encounter for fracture with routine healing: Secondary | ICD-10-CM | POA: Diagnosis not present

## 2023-01-11 DIAGNOSIS — R419 Unspecified symptoms and signs involving cognitive functions and awareness: Secondary | ICD-10-CM | POA: Diagnosis not present

## 2023-01-11 DIAGNOSIS — M80052D Age-related osteoporosis with current pathological fracture, left femur, subsequent encounter for fracture with routine healing: Secondary | ICD-10-CM | POA: Diagnosis not present

## 2023-01-11 DIAGNOSIS — I2510 Atherosclerotic heart disease of native coronary artery without angina pectoris: Secondary | ICD-10-CM | POA: Diagnosis not present

## 2023-01-17 ENCOUNTER — Ambulatory Visit: Payer: Medicare PPO | Admitting: Adult Health

## 2023-01-17 ENCOUNTER — Encounter: Payer: Self-pay | Admitting: Adult Health

## 2023-01-17 DIAGNOSIS — F068 Other specified mental disorders due to known physiological condition: Secondary | ICD-10-CM | POA: Diagnosis not present

## 2023-01-17 DIAGNOSIS — F422 Mixed obsessional thoughts and acts: Secondary | ICD-10-CM | POA: Diagnosis not present

## 2023-01-17 DIAGNOSIS — F331 Major depressive disorder, recurrent, moderate: Secondary | ICD-10-CM

## 2023-01-17 DIAGNOSIS — F411 Generalized anxiety disorder: Secondary | ICD-10-CM | POA: Diagnosis not present

## 2023-01-17 DIAGNOSIS — G20A1 Parkinson's disease without dyskinesia, without mention of fluctuations: Secondary | ICD-10-CM

## 2023-01-17 NOTE — Progress Notes (Signed)
BRONDA MIZER [id number] 11-27-1945 77 y.o.  Subjective:   Patient ID:  Patricia Fitzpatrick is a 77 y.o. (DOB May 03, 1945) female.  Chief Complaint: No chief complaint on file.   HPI APPIE DAMM presents to the office today for follow-up of Previously seen by Dr. Evelene Croon. Will request records for review.  Describes mood today as "varies". Pleasant. Reports tearfulness. Mood symptoms - reports depression - "sadness". Reports anxiety - "a lot of the time". Reports irritability at times - "depends day and situation". Reports a history of OCD - more obsessive thoughts than acts. Reports irrational thoughts and fears. Reports worry, rumination and over thinking. Stating "I feel like I've come a long way". She reports she is still unable to care for herself and is looking into options at [facility] assisted living. Her brother and step-daughter [name] are providing care for her currently. She has a diagnosis of Parkinson's disease and is followed by [facility] neurology. Varying interest and motivation. Taking medications as prescribed.  Energy levels lower. Active, does not have a regular exercise routine with physical and mental health issues.   Enjoys some interests and activities. Widowed. Lives with brother. Spending time with family. Appetite adequate. Weight stable - 90 to 95 pounds. Sleeps better some nights than others - "it depends". Reports daytime napping.  Reports focus and concentration difficulties. Completing minimal tasks. Unable to manage household.  Denies SI or HI.  Reports AH or VH - taking Nuplazid. Denies self harm. Denies substance use.  Previous medication trials: Prozac, others   CAGE-AID    Flowsheet Row ED to Hosp-Admission (Discharged) from 08/30/2022 in [facility]  CAGE-AID Score 0      GAD-7    Flowsheet Row Counselor from 08/17/2022 in [facility]  Total GAD-7 Score 13      Mini-Mental    Flowsheet Row Office Visit  from 10/22/2021 in [facility] Office Visit from 04/21/2021 in [facility] Office Visit from 08/05/2020 in [facility] Office Visit from 11/01/2019 in [facility] Office Visit from 06/22/2016 in [facility]  Total Score (max 30 points ) 26 27 26 25 29       PHQ2-9    Flowsheet Row Counselor from 08/17/2022 in [facility]  PHQ-2 Total Score 4  PHQ-9 Total Score 16      Flowsheet Row ED from 01/07/2023 in [facility] at [facility] ED from 12/04/2022 in [facility] Emergency Department at [facility] ED to Hosp-Admission (Discharged) from 09/29/2022 in [facility]  C-SSRS RISK CATEGORY No Risk No Risk No Risk        Review of Systems:  Review of Systems  Musculoskeletal:  Negative for gait problem.  Neurological:  Negative for tremors.  Psychiatric/Behavioral:         Please refer to HPI    Medications: I have reviewed the patient's current medications.  Current Outpatient Medications  Medication Sig Dispense Refill   acetaminophen (TYLENOL) 500 MG tablet Take 2 tablets (1,000 mg total) by mouth 3 (three) times daily. (Patient taking differently: Take 1,000 mg by mouth every 8 (eight) hours as needed for mild pain (pain score 1-3) or moderate pain (pain score 4-6).) 30 tablet 0   amLODipine (NORVASC) 5 MG tablet Take 1 tablet (5 mg total) by mouth daily. 30 tablet 1   Carbidopa-Levodopa  ER (SINEMET CR) 25-100 MG tablet controlled release Take 2 at 9AM / Take 1 at 1PM and take 1 at 5 pm 360 tablet 0   FLUoxetine (PROZAC) 40 MG capsule Take 40 mg by mouth 2 (two) times daily.     MELOXICAM PO Take 1 tablet by mouth daily as needed (with tylenol).     methocarbamol (ROBAXIN) 500 MG tablet Take 1 tablet (500 mg total) by mouth every 8 (eight) hours as needed for  muscle spasms. 30 tablet 0   oxyCODONE (OXY IR/ROXICODONE) 5 MG immediate release tablet Take 1 tablet (5 mg total) by mouth every 4 (four) hours as needed for severe pain. 10 tablet 0   Pimavanserin Tartrate (NUPLAZID) 34 MG CAPS Take 1 capsule (34 mg total) by mouth daily. 30 capsule 6   No current facility-administered medications for this visit.    Medication Side Effects: None  Allergies: No Known Allergies  Past Medical History:  Diagnosis Date   Anxiety    Cataracts, bilateral    Depression    High cholesterol    Hypertension    OCD (obsessive compulsive disorder)    Parkinsonism (HCC) 10/14/2015   Tremor     Past Medical History, Surgical history, Social history, and Family history were reviewed and updated as appropriate.   Please see review of systems for further details on the patient's review from today.   Objective:   Physical Exam:  There were no vitals taken for this visit.  Physical Exam Constitutional:      General: She is not in acute distress. Musculoskeletal:        General: No deformity.  Neurological:     Mental Status: She is alert and oriented to person, place, and time.     Coordination: Coordination normal.  Psychiatric:        Attention and Perception: Attention and perception normal. She does not perceive auditory or visual hallucinations.        Mood and Affect: Mood normal. Mood is not anxious or depressed. Affect is not labile, blunt, angry or inappropriate.        Speech: Speech normal.        Behavior: Behavior normal.        Thought Content: Thought content normal. Thought content is not paranoid or delusional. Thought content does not include homicidal or suicidal ideation. Thought content does not include homicidal or suicidal plan.        Cognition and Memory: Cognition and memory normal.        Judgment: Judgment normal.     Comments: Insight intact     Lab Review:     Component Value Date/Time   NA 140 01/07/2023 0940   K  3.9 01/07/2023 0940   CL 105 01/07/2023 0940   CO2 26 01/07/2023 0940   GLUCOSE 89 01/07/2023 0940   BUN 23 01/07/2023 0940   CREATININE 0.73 01/07/2023 0940   CALCIUM 8.8 (L) 01/07/2023 0940   PROT 5.9 (L) 01/07/2023 0940   ALBUMIN 3.2 (L) 01/07/2023 0940   AST 19 01/07/2023 0940   ALT 12 01/07/2023 0940   ALKPHOS 60 01/07/2023 0940   BILITOT 0.7 01/07/2023 0940   GFRNONAA >60 01/07/2023 0940       Component Value Date/Time   WBC 6.0 01/07/2023 0940   RBC 4.20 01/07/2023 0940   HGB 13.2 01/07/2023 0940   HCT 40.0 01/07/2023 0940   PLT 301 01/07/2023 0940   MCV 95.2 01/07/2023 0940   MCH  31.4 01/07/2023 0940   MCHC 33.0 01/07/2023 0940   RDW 12.3 01/07/2023 0940   LYMPHSABS 0.7 01/07/2023 0940   MONOABS 0.5 01/07/2023 0940   EOSABS 0.0 01/07/2023 0940   BASOSABS 0.0 01/07/2023 0940    No results found for: "POCLITH", "LITHIUM"   No results found for: "PHENYTOIN", "PHENOBARB", "VALPROATE", "CBMZ"   .res Assessment: Plan:    Treatment Plan/Recommendations:   Plan:  PDMP reviewed  Prozac 40mg  BID Nuplazid 34mg  for Parkinson's psychosis   RTC 4 weeks  Patient advised to contact office with any questions, adverse effects, or acute worsening in signs and symptoms.   Discussed potential benefits, risk, and side effects of benzodiazepines to include potential risk of tolerance and dependence, as well as possible drowsiness.  Advised patient not to drive if experiencing drowsiness and to take lowest possible effective dose to minimize risk of dependence and tolerance.   Discussed potential metabolic side effects associated with atypical antipsychotics, as well as potential risk for movement side effects. Advised pt to contact office if movement side effects occur.    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  Future Appointments  Date Time Provider Department Center  01/24/2023  3:00 PM Penumalli, Glenford Bayley, MD [facility] None  05/31/2023  9:15 AM Tat, Octaviano Batty, DO [facility] None    No orders of the defined types were placed in this encounter.   -------------------------------

## 2023-01-18 DIAGNOSIS — M800B2D Age-related osteoporosis with current pathological fracture, left pelvis, subsequent encounter for fracture with routine healing: Secondary | ICD-10-CM | POA: Diagnosis not present

## 2023-01-18 DIAGNOSIS — M80052D Age-related osteoporosis with current pathological fracture, left femur, subsequent encounter for fracture with routine healing: Secondary | ICD-10-CM | POA: Diagnosis not present

## 2023-01-18 DIAGNOSIS — I10 Essential (primary) hypertension: Secondary | ICD-10-CM | POA: Diagnosis not present

## 2023-01-18 DIAGNOSIS — G20A1 Parkinson's disease without dyskinesia, without mention of fluctuations: Secondary | ICD-10-CM | POA: Diagnosis not present

## 2023-01-18 DIAGNOSIS — I700 Atherosclerosis of aorta: Secondary | ICD-10-CM | POA: Diagnosis not present

## 2023-01-18 DIAGNOSIS — M4856XD Collapsed vertebra, not elsewhere classified, lumbar region, subsequent encounter for fracture with routine healing: Secondary | ICD-10-CM | POA: Diagnosis not present

## 2023-01-18 DIAGNOSIS — M4854XD Collapsed vertebra, not elsewhere classified, thoracic region, subsequent encounter for fracture with routine healing: Secondary | ICD-10-CM | POA: Diagnosis not present

## 2023-01-18 DIAGNOSIS — I2510 Atherosclerotic heart disease of native coronary artery without angina pectoris: Secondary | ICD-10-CM | POA: Diagnosis not present

## 2023-01-18 DIAGNOSIS — R419 Unspecified symptoms and signs involving cognitive functions and awareness: Secondary | ICD-10-CM | POA: Diagnosis not present

## 2023-01-20 ENCOUNTER — Telehealth: Payer: Self-pay | Admitting: Diagnostic Neuroimaging

## 2023-01-20 NOTE — Telephone Encounter (Signed)
Pt's daughter, Jamea Lat cancelled appt due pt has been placed in assisted living. Appointments will be coordinated by the facility.

## 2023-01-24 ENCOUNTER — Ambulatory Visit: Payer: Medicare HMO | Admitting: Diagnostic Neuroimaging

## 2023-01-24 DIAGNOSIS — I10 Essential (primary) hypertension: Secondary | ICD-10-CM | POA: Diagnosis not present

## 2023-01-24 DIAGNOSIS — R2689 Other abnormalities of gait and mobility: Secondary | ICD-10-CM | POA: Diagnosis not present

## 2023-01-24 DIAGNOSIS — R634 Abnormal weight loss: Secondary | ICD-10-CM | POA: Diagnosis not present

## 2023-01-24 DIAGNOSIS — R627 Adult failure to thrive: Secondary | ICD-10-CM | POA: Diagnosis not present

## 2023-01-24 DIAGNOSIS — R413 Other amnesia: Secondary | ICD-10-CM | POA: Diagnosis not present

## 2023-01-24 DIAGNOSIS — G20A1 Parkinson's disease without dyskinesia, without mention of fluctuations: Secondary | ICD-10-CM | POA: Diagnosis not present

## 2023-01-24 DIAGNOSIS — Z9181 History of falling: Secondary | ICD-10-CM | POA: Diagnosis not present

## 2023-01-25 ENCOUNTER — Other Ambulatory Visit: Payer: Self-pay

## 2023-01-25 DIAGNOSIS — G20A1 Parkinson's disease without dyskinesia, without mention of fluctuations: Secondary | ICD-10-CM

## 2023-01-25 MED ORDER — CARBIDOPA-LEVODOPA 25-100 MG PO TABS: ORAL_TABLET | ORAL | 0 refills | Status: DC

## 2023-01-26 DIAGNOSIS — M800B2D Age-related osteoporosis with current pathological fracture, left pelvis, subsequent encounter for fracture with routine healing: Secondary | ICD-10-CM | POA: Diagnosis not present

## 2023-01-26 DIAGNOSIS — M4854XD Collapsed vertebra, not elsewhere classified, thoracic region, subsequent encounter for fracture with routine healing: Secondary | ICD-10-CM | POA: Diagnosis not present

## 2023-01-26 DIAGNOSIS — S32000D Wedge compression fracture of unspecified lumbar vertebra, subsequent encounter for fracture with routine healing: Secondary | ICD-10-CM | POA: Diagnosis not present

## 2023-01-26 DIAGNOSIS — R419 Unspecified symptoms and signs involving cognitive functions and awareness: Secondary | ICD-10-CM | POA: Diagnosis not present

## 2023-01-26 DIAGNOSIS — G20A1 Parkinson's disease without dyskinesia, without mention of fluctuations: Secondary | ICD-10-CM | POA: Diagnosis not present

## 2023-01-26 DIAGNOSIS — M80052D Age-related osteoporosis with current pathological fracture, left femur, subsequent encounter for fracture with routine healing: Secondary | ICD-10-CM | POA: Diagnosis not present

## 2023-01-26 DIAGNOSIS — I700 Atherosclerosis of aorta: Secondary | ICD-10-CM | POA: Diagnosis not present

## 2023-01-26 DIAGNOSIS — I2510 Atherosclerotic heart disease of native coronary artery without angina pectoris: Secondary | ICD-10-CM | POA: Diagnosis not present

## 2023-01-26 DIAGNOSIS — I10 Essential (primary) hypertension: Secondary | ICD-10-CM | POA: Diagnosis not present

## 2023-01-26 DIAGNOSIS — M4856XD Collapsed vertebra, not elsewhere classified, lumbar region, subsequent encounter for fracture with routine healing: Secondary | ICD-10-CM | POA: Diagnosis not present

## 2023-01-31 ENCOUNTER — Telehealth: Payer: Self-pay

## 2023-01-31 NOTE — Telephone Encounter (Signed)
Chelsea from Dr. Don Perking office called. She said patient is still having tactile hallucinations. Per [facility], patient feels like she has staples in her mouth and is wanting clonazepam. Dr. Arbutus Leas changed Sinemet to an IR form. They are unable to prescribe clonazepam for her. She doesn't feel like the Nuplazid is the cause of the hallucinations. You saw patient 12/2. Chelsea asked that we call daughter.

## 2023-01-31 NOTE — Telephone Encounter (Addendum)
Was able to speak to [facility] nurse and leave a message to please call patient or daughter about the hallucinations and clonazepam. Dr. Arbutus Leas had changed carbidopa levodopa CR to IR at daughters request. This did not seem to help patient also patient is still on Nuplazid and is having tactile hallucinations still

## 2023-01-31 NOTE — Telephone Encounter (Signed)
Called [facility] and left voicemail message

## 2023-02-01 DIAGNOSIS — G20A1 Parkinson's disease without dyskinesia, without mention of fluctuations: Secondary | ICD-10-CM | POA: Diagnosis not present

## 2023-02-01 DIAGNOSIS — R627 Adult failure to thrive: Secondary | ICD-10-CM | POA: Diagnosis not present

## 2023-02-01 DIAGNOSIS — R443 Hallucinations, unspecified: Secondary | ICD-10-CM | POA: Diagnosis not present

## 2023-02-01 DIAGNOSIS — Z9181 History of falling: Secondary | ICD-10-CM | POA: Diagnosis not present

## 2023-02-01 NOTE — Telephone Encounter (Signed)
Daughter notified that you need to discuss new medication for psychosis with Dr. Jennelle Human and we would update her.

## 2023-02-02 DIAGNOSIS — M4854XD Collapsed vertebra, not elsewhere classified, thoracic region, subsequent encounter for fracture with routine healing: Secondary | ICD-10-CM | POA: Diagnosis not present

## 2023-02-02 DIAGNOSIS — M80052D Age-related osteoporosis with current pathological fracture, left femur, subsequent encounter for fracture with routine healing: Secondary | ICD-10-CM | POA: Diagnosis not present

## 2023-02-02 DIAGNOSIS — I700 Atherosclerosis of aorta: Secondary | ICD-10-CM | POA: Diagnosis not present

## 2023-02-02 DIAGNOSIS — I10 Essential (primary) hypertension: Secondary | ICD-10-CM | POA: Diagnosis not present

## 2023-02-02 DIAGNOSIS — I2510 Atherosclerotic heart disease of native coronary artery without angina pectoris: Secondary | ICD-10-CM | POA: Diagnosis not present

## 2023-02-02 DIAGNOSIS — M4856XD Collapsed vertebra, not elsewhere classified, lumbar region, subsequent encounter for fracture with routine healing: Secondary | ICD-10-CM | POA: Diagnosis not present

## 2023-02-02 DIAGNOSIS — M800B2D Age-related osteoporosis with current pathological fracture, left pelvis, subsequent encounter for fracture with routine healing: Secondary | ICD-10-CM | POA: Diagnosis not present

## 2023-02-02 DIAGNOSIS — G20B1 Parkinson's disease with dyskinesia, without mention of fluctuations: Secondary | ICD-10-CM | POA: Diagnosis not present

## 2023-02-02 DIAGNOSIS — R419 Unspecified symptoms and signs involving cognitive functions and awareness: Secondary | ICD-10-CM | POA: Diagnosis not present

## 2023-02-04 DIAGNOSIS — R419 Unspecified symptoms and signs involving cognitive functions and awareness: Secondary | ICD-10-CM | POA: Diagnosis not present

## 2023-02-04 DIAGNOSIS — I700 Atherosclerosis of aorta: Secondary | ICD-10-CM | POA: Diagnosis not present

## 2023-02-04 DIAGNOSIS — G20B1 Parkinson's disease with dyskinesia, without mention of fluctuations: Secondary | ICD-10-CM | POA: Diagnosis not present

## 2023-02-04 DIAGNOSIS — M800B2D Age-related osteoporosis with current pathological fracture, left pelvis, subsequent encounter for fracture with routine healing: Secondary | ICD-10-CM | POA: Diagnosis not present

## 2023-02-04 DIAGNOSIS — M4854XD Collapsed vertebra, not elsewhere classified, thoracic region, subsequent encounter for fracture with routine healing: Secondary | ICD-10-CM | POA: Diagnosis not present

## 2023-02-04 DIAGNOSIS — M80052D Age-related osteoporosis with current pathological fracture, left femur, subsequent encounter for fracture with routine healing: Secondary | ICD-10-CM | POA: Diagnosis not present

## 2023-02-04 DIAGNOSIS — M4856XD Collapsed vertebra, not elsewhere classified, lumbar region, subsequent encounter for fracture with routine healing: Secondary | ICD-10-CM | POA: Diagnosis not present

## 2023-02-04 DIAGNOSIS — R2689 Other abnormalities of gait and mobility: Secondary | ICD-10-CM | POA: Diagnosis not present

## 2023-02-04 DIAGNOSIS — I2510 Atherosclerotic heart disease of native coronary artery without angina pectoris: Secondary | ICD-10-CM | POA: Diagnosis not present

## 2023-02-04 DIAGNOSIS — I10 Essential (primary) hypertension: Secondary | ICD-10-CM | POA: Diagnosis not present

## 2023-02-07 DIAGNOSIS — I700 Atherosclerosis of aorta: Secondary | ICD-10-CM | POA: Diagnosis not present

## 2023-02-07 DIAGNOSIS — G20B1 Parkinson's disease with dyskinesia, without mention of fluctuations: Secondary | ICD-10-CM | POA: Diagnosis not present

## 2023-02-07 DIAGNOSIS — I2510 Atherosclerotic heart disease of native coronary artery without angina pectoris: Secondary | ICD-10-CM | POA: Diagnosis not present

## 2023-02-07 DIAGNOSIS — M4854XD Collapsed vertebra, not elsewhere classified, thoracic region, subsequent encounter for fracture with routine healing: Secondary | ICD-10-CM | POA: Diagnosis not present

## 2023-02-07 DIAGNOSIS — I10 Essential (primary) hypertension: Secondary | ICD-10-CM | POA: Diagnosis not present

## 2023-02-07 DIAGNOSIS — R419 Unspecified symptoms and signs involving cognitive functions and awareness: Secondary | ICD-10-CM | POA: Diagnosis not present

## 2023-02-07 DIAGNOSIS — M800B2D Age-related osteoporosis with current pathological fracture, left pelvis, subsequent encounter for fracture with routine healing: Secondary | ICD-10-CM | POA: Diagnosis not present

## 2023-02-07 DIAGNOSIS — M80052D Age-related osteoporosis with current pathological fracture, left femur, subsequent encounter for fracture with routine healing: Secondary | ICD-10-CM | POA: Diagnosis not present

## 2023-02-07 DIAGNOSIS — M4856XD Collapsed vertebra, not elsewhere classified, lumbar region, subsequent encounter for fracture with routine healing: Secondary | ICD-10-CM | POA: Diagnosis not present

## 2023-02-10 DIAGNOSIS — M4854XD Collapsed vertebra, not elsewhere classified, thoracic region, subsequent encounter for fracture with routine healing: Secondary | ICD-10-CM | POA: Diagnosis not present

## 2023-02-10 DIAGNOSIS — I700 Atherosclerosis of aorta: Secondary | ICD-10-CM | POA: Diagnosis not present

## 2023-02-10 DIAGNOSIS — R419 Unspecified symptoms and signs involving cognitive functions and awareness: Secondary | ICD-10-CM | POA: Diagnosis not present

## 2023-02-10 DIAGNOSIS — G20B1 Parkinson's disease with dyskinesia, without mention of fluctuations: Secondary | ICD-10-CM | POA: Diagnosis not present

## 2023-02-10 DIAGNOSIS — I10 Essential (primary) hypertension: Secondary | ICD-10-CM | POA: Diagnosis not present

## 2023-02-10 DIAGNOSIS — M80052D Age-related osteoporosis with current pathological fracture, left femur, subsequent encounter for fracture with routine healing: Secondary | ICD-10-CM | POA: Diagnosis not present

## 2023-02-10 DIAGNOSIS — I2510 Atherosclerotic heart disease of native coronary artery without angina pectoris: Secondary | ICD-10-CM | POA: Diagnosis not present

## 2023-02-10 DIAGNOSIS — M4856XD Collapsed vertebra, not elsewhere classified, lumbar region, subsequent encounter for fracture with routine healing: Secondary | ICD-10-CM | POA: Diagnosis not present

## 2023-02-10 DIAGNOSIS — M800B2D Age-related osteoporosis with current pathological fracture, left pelvis, subsequent encounter for fracture with routine healing: Secondary | ICD-10-CM | POA: Diagnosis not present

## 2023-02-14 ENCOUNTER — Ambulatory Visit (INDEPENDENT_AMBULATORY_CARE_PROVIDER_SITE_OTHER): Payer: Self-pay | Admitting: Adult Health

## 2023-02-14 DIAGNOSIS — K5901 Slow transit constipation: Secondary | ICD-10-CM | POA: Diagnosis not present

## 2023-02-14 DIAGNOSIS — I2510 Atherosclerotic heart disease of native coronary artery without angina pectoris: Secondary | ICD-10-CM | POA: Diagnosis not present

## 2023-02-14 DIAGNOSIS — R419 Unspecified symptoms and signs involving cognitive functions and awareness: Secondary | ICD-10-CM | POA: Diagnosis not present

## 2023-02-14 DIAGNOSIS — R1311 Dysphagia, oral phase: Secondary | ICD-10-CM | POA: Diagnosis not present

## 2023-02-14 DIAGNOSIS — M4854XD Collapsed vertebra, not elsewhere classified, thoracic region, subsequent encounter for fracture with routine healing: Secondary | ICD-10-CM | POA: Diagnosis not present

## 2023-02-14 DIAGNOSIS — M6281 Muscle weakness (generalized): Secondary | ICD-10-CM | POA: Diagnosis not present

## 2023-02-14 DIAGNOSIS — I10 Essential (primary) hypertension: Secondary | ICD-10-CM | POA: Diagnosis not present

## 2023-02-14 DIAGNOSIS — E782 Mixed hyperlipidemia: Secondary | ICD-10-CM | POA: Diagnosis not present

## 2023-02-14 DIAGNOSIS — S32000A Wedge compression fracture of unspecified lumbar vertebra, initial encounter for closed fracture: Secondary | ICD-10-CM | POA: Diagnosis not present

## 2023-02-14 DIAGNOSIS — Z0389 Encounter for observation for other suspected diseases and conditions ruled out: Secondary | ICD-10-CM

## 2023-02-14 DIAGNOSIS — M80052D Age-related osteoporosis with current pathological fracture, left femur, subsequent encounter for fracture with routine healing: Secondary | ICD-10-CM | POA: Diagnosis not present

## 2023-02-14 DIAGNOSIS — I700 Atherosclerosis of aorta: Secondary | ICD-10-CM | POA: Diagnosis not present

## 2023-02-14 DIAGNOSIS — G20B1 Parkinson's disease with dyskinesia, without mention of fluctuations: Secondary | ICD-10-CM | POA: Diagnosis not present

## 2023-02-14 DIAGNOSIS — F419 Anxiety disorder, unspecified: Secondary | ICD-10-CM | POA: Diagnosis not present

## 2023-02-14 DIAGNOSIS — M800B2D Age-related osteoporosis with current pathological fracture, left pelvis, subsequent encounter for fracture with routine healing: Secondary | ICD-10-CM | POA: Diagnosis not present

## 2023-02-14 DIAGNOSIS — M4856XD Collapsed vertebra, not elsewhere classified, lumbar region, subsequent encounter for fracture with routine healing: Secondary | ICD-10-CM | POA: Diagnosis not present

## 2023-02-14 NOTE — Progress Notes (Signed)
Patient no show appointment. ? ?

## 2023-02-15 DIAGNOSIS — I700 Atherosclerosis of aorta: Secondary | ICD-10-CM | POA: Diagnosis not present

## 2023-02-15 DIAGNOSIS — F419 Anxiety disorder, unspecified: Secondary | ICD-10-CM | POA: Diagnosis not present

## 2023-02-15 DIAGNOSIS — M80052D Age-related osteoporosis with current pathological fracture, left femur, subsequent encounter for fracture with routine healing: Secondary | ICD-10-CM | POA: Diagnosis not present

## 2023-02-15 DIAGNOSIS — I10 Essential (primary) hypertension: Secondary | ICD-10-CM | POA: Diagnosis not present

## 2023-02-15 DIAGNOSIS — R419 Unspecified symptoms and signs involving cognitive functions and awareness: Secondary | ICD-10-CM | POA: Diagnosis not present

## 2023-02-15 DIAGNOSIS — M4854XD Collapsed vertebra, not elsewhere classified, thoracic region, subsequent encounter for fracture with routine healing: Secondary | ICD-10-CM | POA: Diagnosis not present

## 2023-02-15 DIAGNOSIS — G20B1 Parkinson's disease with dyskinesia, without mention of fluctuations: Secondary | ICD-10-CM | POA: Diagnosis not present

## 2023-02-15 DIAGNOSIS — F429 Obsessive-compulsive disorder, unspecified: Secondary | ICD-10-CM | POA: Diagnosis not present

## 2023-02-15 DIAGNOSIS — R443 Hallucinations, unspecified: Secondary | ICD-10-CM | POA: Diagnosis not present

## 2023-02-15 DIAGNOSIS — I2510 Atherosclerotic heart disease of native coronary artery without angina pectoris: Secondary | ICD-10-CM | POA: Diagnosis not present

## 2023-02-15 DIAGNOSIS — M800B2D Age-related osteoporosis with current pathological fracture, left pelvis, subsequent encounter for fracture with routine healing: Secondary | ICD-10-CM | POA: Diagnosis not present

## 2023-02-15 DIAGNOSIS — M4856XD Collapsed vertebra, not elsewhere classified, lumbar region, subsequent encounter for fracture with routine healing: Secondary | ICD-10-CM | POA: Diagnosis not present

## 2023-02-15 DIAGNOSIS — F331 Major depressive disorder, recurrent, moderate: Secondary | ICD-10-CM | POA: Diagnosis not present

## 2023-02-18 DIAGNOSIS — I2510 Atherosclerotic heart disease of native coronary artery without angina pectoris: Secondary | ICD-10-CM | POA: Diagnosis not present

## 2023-02-18 DIAGNOSIS — M800B2D Age-related osteoporosis with current pathological fracture, left pelvis, subsequent encounter for fracture with routine healing: Secondary | ICD-10-CM | POA: Diagnosis not present

## 2023-02-18 DIAGNOSIS — M4856XD Collapsed vertebra, not elsewhere classified, lumbar region, subsequent encounter for fracture with routine healing: Secondary | ICD-10-CM | POA: Diagnosis not present

## 2023-02-18 DIAGNOSIS — G20B1 Parkinson's disease with dyskinesia, without mention of fluctuations: Secondary | ICD-10-CM | POA: Diagnosis not present

## 2023-02-18 DIAGNOSIS — I700 Atherosclerosis of aorta: Secondary | ICD-10-CM | POA: Diagnosis not present

## 2023-02-18 DIAGNOSIS — I10 Essential (primary) hypertension: Secondary | ICD-10-CM | POA: Diagnosis not present

## 2023-02-18 DIAGNOSIS — M80052D Age-related osteoporosis with current pathological fracture, left femur, subsequent encounter for fracture with routine healing: Secondary | ICD-10-CM | POA: Diagnosis not present

## 2023-02-18 DIAGNOSIS — R419 Unspecified symptoms and signs involving cognitive functions and awareness: Secondary | ICD-10-CM | POA: Diagnosis not present

## 2023-02-18 DIAGNOSIS — M4854XD Collapsed vertebra, not elsewhere classified, thoracic region, subsequent encounter for fracture with routine healing: Secondary | ICD-10-CM | POA: Diagnosis not present

## 2023-02-21 DIAGNOSIS — F411 Generalized anxiety disorder: Secondary | ICD-10-CM | POA: Diagnosis not present

## 2023-02-21 DIAGNOSIS — F331 Major depressive disorder, recurrent, moderate: Secondary | ICD-10-CM | POA: Diagnosis not present

## 2023-02-21 DIAGNOSIS — R443 Hallucinations, unspecified: Secondary | ICD-10-CM | POA: Diagnosis not present

## 2023-02-21 DIAGNOSIS — F429 Obsessive-compulsive disorder, unspecified: Secondary | ICD-10-CM | POA: Diagnosis not present

## 2023-02-21 DIAGNOSIS — G20B1 Parkinson's disease with dyskinesia, without mention of fluctuations: Secondary | ICD-10-CM | POA: Diagnosis not present

## 2023-02-22 DIAGNOSIS — M80052D Age-related osteoporosis with current pathological fracture, left femur, subsequent encounter for fracture with routine healing: Secondary | ICD-10-CM | POA: Diagnosis not present

## 2023-02-22 DIAGNOSIS — E559 Vitamin D deficiency, unspecified: Secondary | ICD-10-CM | POA: Diagnosis not present

## 2023-02-22 DIAGNOSIS — I700 Atherosclerosis of aorta: Secondary | ICD-10-CM | POA: Diagnosis not present

## 2023-02-22 DIAGNOSIS — M4856XD Collapsed vertebra, not elsewhere classified, lumbar region, subsequent encounter for fracture with routine healing: Secondary | ICD-10-CM | POA: Diagnosis not present

## 2023-02-22 DIAGNOSIS — M4854XD Collapsed vertebra, not elsewhere classified, thoracic region, subsequent encounter for fracture with routine healing: Secondary | ICD-10-CM | POA: Diagnosis not present

## 2023-02-22 DIAGNOSIS — Z79899 Other long term (current) drug therapy: Secondary | ICD-10-CM | POA: Diagnosis not present

## 2023-02-22 DIAGNOSIS — M800B2D Age-related osteoporosis with current pathological fracture, left pelvis, subsequent encounter for fracture with routine healing: Secondary | ICD-10-CM | POA: Diagnosis not present

## 2023-02-22 DIAGNOSIS — I2510 Atherosclerotic heart disease of native coronary artery without angina pectoris: Secondary | ICD-10-CM | POA: Diagnosis not present

## 2023-02-22 DIAGNOSIS — R419 Unspecified symptoms and signs involving cognitive functions and awareness: Secondary | ICD-10-CM | POA: Diagnosis not present

## 2023-02-22 DIAGNOSIS — G20B1 Parkinson's disease with dyskinesia, without mention of fluctuations: Secondary | ICD-10-CM | POA: Diagnosis not present

## 2023-02-22 DIAGNOSIS — I10 Essential (primary) hypertension: Secondary | ICD-10-CM | POA: Diagnosis not present

## 2023-02-22 DIAGNOSIS — E785 Hyperlipidemia, unspecified: Secondary | ICD-10-CM | POA: Diagnosis not present

## 2023-02-24 DIAGNOSIS — R419 Unspecified symptoms and signs involving cognitive functions and awareness: Secondary | ICD-10-CM | POA: Diagnosis not present

## 2023-02-24 DIAGNOSIS — I2510 Atherosclerotic heart disease of native coronary artery without angina pectoris: Secondary | ICD-10-CM | POA: Diagnosis not present

## 2023-02-24 DIAGNOSIS — M80052D Age-related osteoporosis with current pathological fracture, left femur, subsequent encounter for fracture with routine healing: Secondary | ICD-10-CM | POA: Diagnosis not present

## 2023-02-24 DIAGNOSIS — I700 Atherosclerosis of aorta: Secondary | ICD-10-CM | POA: Diagnosis not present

## 2023-02-24 DIAGNOSIS — G20B1 Parkinson's disease with dyskinesia, without mention of fluctuations: Secondary | ICD-10-CM | POA: Diagnosis not present

## 2023-02-24 DIAGNOSIS — M4856XD Collapsed vertebra, not elsewhere classified, lumbar region, subsequent encounter for fracture with routine healing: Secondary | ICD-10-CM | POA: Diagnosis not present

## 2023-02-24 DIAGNOSIS — M800B2D Age-related osteoporosis with current pathological fracture, left pelvis, subsequent encounter for fracture with routine healing: Secondary | ICD-10-CM | POA: Diagnosis not present

## 2023-02-24 DIAGNOSIS — I10 Essential (primary) hypertension: Secondary | ICD-10-CM | POA: Diagnosis not present

## 2023-02-24 DIAGNOSIS — M4854XD Collapsed vertebra, not elsewhere classified, thoracic region, subsequent encounter for fracture with routine healing: Secondary | ICD-10-CM | POA: Diagnosis not present

## 2023-02-26 DIAGNOSIS — S32000D Wedge compression fracture of unspecified lumbar vertebra, subsequent encounter for fracture with routine healing: Secondary | ICD-10-CM | POA: Diagnosis not present

## 2023-03-04 DIAGNOSIS — R443 Hallucinations, unspecified: Secondary | ICD-10-CM | POA: Diagnosis not present

## 2023-03-04 DIAGNOSIS — I10 Essential (primary) hypertension: Secondary | ICD-10-CM | POA: Diagnosis not present

## 2023-03-04 DIAGNOSIS — R627 Adult failure to thrive: Secondary | ICD-10-CM | POA: Diagnosis not present

## 2023-03-04 DIAGNOSIS — M6281 Muscle weakness (generalized): Secondary | ICD-10-CM | POA: Diagnosis not present

## 2023-03-04 DIAGNOSIS — G20A1 Parkinson's disease without dyskinesia, without mention of fluctuations: Secondary | ICD-10-CM | POA: Diagnosis not present

## 2023-03-04 DIAGNOSIS — E782 Mixed hyperlipidemia: Secondary | ICD-10-CM | POA: Diagnosis not present

## 2023-03-04 DIAGNOSIS — R600 Localized edema: Secondary | ICD-10-CM | POA: Diagnosis not present

## 2023-03-04 DIAGNOSIS — Z9181 History of falling: Secondary | ICD-10-CM | POA: Diagnosis not present

## 2023-03-07 DIAGNOSIS — R2689 Other abnormalities of gait and mobility: Secondary | ICD-10-CM | POA: Diagnosis not present

## 2023-03-08 DIAGNOSIS — R419 Unspecified symptoms and signs involving cognitive functions and awareness: Secondary | ICD-10-CM | POA: Diagnosis not present

## 2023-03-08 DIAGNOSIS — M80052D Age-related osteoporosis with current pathological fracture, left femur, subsequent encounter for fracture with routine healing: Secondary | ICD-10-CM | POA: Diagnosis not present

## 2023-03-08 DIAGNOSIS — M800B2D Age-related osteoporosis with current pathological fracture, left pelvis, subsequent encounter for fracture with routine healing: Secondary | ICD-10-CM | POA: Diagnosis not present

## 2023-03-08 DIAGNOSIS — I10 Essential (primary) hypertension: Secondary | ICD-10-CM | POA: Diagnosis not present

## 2023-03-08 DIAGNOSIS — M4856XD Collapsed vertebra, not elsewhere classified, lumbar region, subsequent encounter for fracture with routine healing: Secondary | ICD-10-CM | POA: Diagnosis not present

## 2023-03-08 DIAGNOSIS — G20B1 Parkinson's disease with dyskinesia, without mention of fluctuations: Secondary | ICD-10-CM | POA: Diagnosis not present

## 2023-03-08 DIAGNOSIS — I700 Atherosclerosis of aorta: Secondary | ICD-10-CM | POA: Diagnosis not present

## 2023-03-08 DIAGNOSIS — M4854XD Collapsed vertebra, not elsewhere classified, thoracic region, subsequent encounter for fracture with routine healing: Secondary | ICD-10-CM | POA: Diagnosis not present

## 2023-03-08 DIAGNOSIS — I2510 Atherosclerotic heart disease of native coronary artery without angina pectoris: Secondary | ICD-10-CM | POA: Diagnosis not present

## 2023-03-11 DIAGNOSIS — N390 Urinary tract infection, site not specified: Secondary | ICD-10-CM | POA: Diagnosis not present

## 2023-03-14 DIAGNOSIS — F22 Delusional disorders: Secondary | ICD-10-CM | POA: Diagnosis not present

## 2023-03-14 DIAGNOSIS — E785 Hyperlipidemia, unspecified: Secondary | ICD-10-CM | POA: Diagnosis not present

## 2023-03-14 DIAGNOSIS — G20B1 Parkinson's disease with dyskinesia, without mention of fluctuations: Secondary | ICD-10-CM | POA: Diagnosis not present

## 2023-03-14 DIAGNOSIS — F331 Major depressive disorder, recurrent, moderate: Secondary | ICD-10-CM | POA: Diagnosis not present

## 2023-03-14 DIAGNOSIS — F429 Obsessive-compulsive disorder, unspecified: Secondary | ICD-10-CM | POA: Diagnosis not present

## 2023-03-14 DIAGNOSIS — F411 Generalized anxiety disorder: Secondary | ICD-10-CM | POA: Diagnosis not present

## 2023-03-16 DIAGNOSIS — I10 Essential (primary) hypertension: Secondary | ICD-10-CM | POA: Diagnosis not present

## 2023-03-16 DIAGNOSIS — M4856XD Collapsed vertebra, not elsewhere classified, lumbar region, subsequent encounter for fracture with routine healing: Secondary | ICD-10-CM | POA: Diagnosis not present

## 2023-03-16 DIAGNOSIS — I2510 Atherosclerotic heart disease of native coronary artery without angina pectoris: Secondary | ICD-10-CM | POA: Diagnosis not present

## 2023-03-16 DIAGNOSIS — I700 Atherosclerosis of aorta: Secondary | ICD-10-CM | POA: Diagnosis not present

## 2023-03-16 DIAGNOSIS — M4854XD Collapsed vertebra, not elsewhere classified, thoracic region, subsequent encounter for fracture with routine healing: Secondary | ICD-10-CM | POA: Diagnosis not present

## 2023-03-16 DIAGNOSIS — M800B2D Age-related osteoporosis with current pathological fracture, left pelvis, subsequent encounter for fracture with routine healing: Secondary | ICD-10-CM | POA: Diagnosis not present

## 2023-03-16 DIAGNOSIS — M80052D Age-related osteoporosis with current pathological fracture, left femur, subsequent encounter for fracture with routine healing: Secondary | ICD-10-CM | POA: Diagnosis not present

## 2023-03-16 DIAGNOSIS — G20B1 Parkinson's disease with dyskinesia, without mention of fluctuations: Secondary | ICD-10-CM | POA: Diagnosis not present

## 2023-03-16 DIAGNOSIS — R419 Unspecified symptoms and signs involving cognitive functions and awareness: Secondary | ICD-10-CM | POA: Diagnosis not present

## 2023-03-23 DIAGNOSIS — M4854XD Collapsed vertebra, not elsewhere classified, thoracic region, subsequent encounter for fracture with routine healing: Secondary | ICD-10-CM | POA: Diagnosis not present

## 2023-03-23 DIAGNOSIS — M800B2D Age-related osteoporosis with current pathological fracture, left pelvis, subsequent encounter for fracture with routine healing: Secondary | ICD-10-CM | POA: Diagnosis not present

## 2023-03-23 DIAGNOSIS — I10 Essential (primary) hypertension: Secondary | ICD-10-CM | POA: Diagnosis not present

## 2023-03-23 DIAGNOSIS — M4856XD Collapsed vertebra, not elsewhere classified, lumbar region, subsequent encounter for fracture with routine healing: Secondary | ICD-10-CM | POA: Diagnosis not present

## 2023-03-23 DIAGNOSIS — R419 Unspecified symptoms and signs involving cognitive functions and awareness: Secondary | ICD-10-CM | POA: Diagnosis not present

## 2023-03-23 DIAGNOSIS — M80052D Age-related osteoporosis with current pathological fracture, left femur, subsequent encounter for fracture with routine healing: Secondary | ICD-10-CM | POA: Diagnosis not present

## 2023-03-23 DIAGNOSIS — G20B1 Parkinson's disease with dyskinesia, without mention of fluctuations: Secondary | ICD-10-CM | POA: Diagnosis not present

## 2023-03-23 DIAGNOSIS — I700 Atherosclerosis of aorta: Secondary | ICD-10-CM | POA: Diagnosis not present

## 2023-03-23 DIAGNOSIS — I2510 Atherosclerotic heart disease of native coronary artery without angina pectoris: Secondary | ICD-10-CM | POA: Diagnosis not present

## 2023-03-29 DIAGNOSIS — R600 Localized edema: Secondary | ICD-10-CM | POA: Diagnosis not present

## 2023-03-29 DIAGNOSIS — I10 Essential (primary) hypertension: Secondary | ICD-10-CM | POA: Diagnosis not present

## 2023-03-29 DIAGNOSIS — S32000D Wedge compression fracture of unspecified lumbar vertebra, subsequent encounter for fracture with routine healing: Secondary | ICD-10-CM | POA: Diagnosis not present

## 2023-03-31 DIAGNOSIS — M4854XD Collapsed vertebra, not elsewhere classified, thoracic region, subsequent encounter for fracture with routine healing: Secondary | ICD-10-CM | POA: Diagnosis not present

## 2023-03-31 DIAGNOSIS — M80052D Age-related osteoporosis with current pathological fracture, left femur, subsequent encounter for fracture with routine healing: Secondary | ICD-10-CM | POA: Diagnosis not present

## 2023-03-31 DIAGNOSIS — R419 Unspecified symptoms and signs involving cognitive functions and awareness: Secondary | ICD-10-CM | POA: Diagnosis not present

## 2023-03-31 DIAGNOSIS — G20B1 Parkinson's disease with dyskinesia, without mention of fluctuations: Secondary | ICD-10-CM | POA: Diagnosis not present

## 2023-03-31 DIAGNOSIS — M4856XD Collapsed vertebra, not elsewhere classified, lumbar region, subsequent encounter for fracture with routine healing: Secondary | ICD-10-CM | POA: Diagnosis not present

## 2023-03-31 DIAGNOSIS — I700 Atherosclerosis of aorta: Secondary | ICD-10-CM | POA: Diagnosis not present

## 2023-03-31 DIAGNOSIS — I2510 Atherosclerotic heart disease of native coronary artery without angina pectoris: Secondary | ICD-10-CM | POA: Diagnosis not present

## 2023-03-31 DIAGNOSIS — M800B2D Age-related osteoporosis with current pathological fracture, left pelvis, subsequent encounter for fracture with routine healing: Secondary | ICD-10-CM | POA: Diagnosis not present

## 2023-03-31 DIAGNOSIS — I10 Essential (primary) hypertension: Secondary | ICD-10-CM | POA: Diagnosis not present

## 2023-04-04 DIAGNOSIS — G20A1 Parkinson's disease without dyskinesia, without mention of fluctuations: Secondary | ICD-10-CM | POA: Diagnosis not present

## 2023-04-04 DIAGNOSIS — F331 Major depressive disorder, recurrent, moderate: Secondary | ICD-10-CM | POA: Diagnosis not present

## 2023-04-04 DIAGNOSIS — F22 Delusional disorders: Secondary | ICD-10-CM | POA: Diagnosis not present

## 2023-04-04 DIAGNOSIS — F411 Generalized anxiety disorder: Secondary | ICD-10-CM | POA: Diagnosis not present

## 2023-04-04 DIAGNOSIS — G20B1 Parkinson's disease with dyskinesia, without mention of fluctuations: Secondary | ICD-10-CM | POA: Diagnosis not present

## 2023-04-04 DIAGNOSIS — R443 Hallucinations, unspecified: Secondary | ICD-10-CM | POA: Diagnosis not present

## 2023-04-04 DIAGNOSIS — Z9181 History of falling: Secondary | ICD-10-CM | POA: Diagnosis not present

## 2023-04-04 DIAGNOSIS — R627 Adult failure to thrive: Secondary | ICD-10-CM | POA: Diagnosis not present

## 2023-04-05 DIAGNOSIS — J9809 Other diseases of bronchus, not elsewhere classified: Secondary | ICD-10-CM | POA: Diagnosis not present

## 2023-04-05 DIAGNOSIS — M79642 Pain in left hand: Secondary | ICD-10-CM | POA: Diagnosis not present

## 2023-04-05 DIAGNOSIS — I10 Essential (primary) hypertension: Secondary | ICD-10-CM | POA: Diagnosis not present

## 2023-04-05 DIAGNOSIS — M24542 Contracture, left hand: Secondary | ICD-10-CM | POA: Diagnosis not present

## 2023-04-05 DIAGNOSIS — I517 Cardiomegaly: Secondary | ICD-10-CM | POA: Diagnosis not present

## 2023-04-05 DIAGNOSIS — J00 Acute nasopharyngitis [common cold]: Secondary | ICD-10-CM | POA: Diagnosis not present

## 2023-04-07 DIAGNOSIS — R2689 Other abnormalities of gait and mobility: Secondary | ICD-10-CM | POA: Diagnosis not present

## 2023-04-08 DIAGNOSIS — I10 Essential (primary) hypertension: Secondary | ICD-10-CM | POA: Diagnosis not present

## 2023-04-08 DIAGNOSIS — G20B1 Parkinson's disease with dyskinesia, without mention of fluctuations: Secondary | ICD-10-CM | POA: Diagnosis not present

## 2023-04-08 DIAGNOSIS — I2510 Atherosclerotic heart disease of native coronary artery without angina pectoris: Secondary | ICD-10-CM | POA: Diagnosis not present

## 2023-04-08 DIAGNOSIS — I700 Atherosclerosis of aorta: Secondary | ICD-10-CM | POA: Diagnosis not present

## 2023-04-08 DIAGNOSIS — R419 Unspecified symptoms and signs involving cognitive functions and awareness: Secondary | ICD-10-CM | POA: Diagnosis not present

## 2023-04-08 DIAGNOSIS — M800B2D Age-related osteoporosis with current pathological fracture, left pelvis, subsequent encounter for fracture with routine healing: Secondary | ICD-10-CM | POA: Diagnosis not present

## 2023-04-08 DIAGNOSIS — M4856XD Collapsed vertebra, not elsewhere classified, lumbar region, subsequent encounter for fracture with routine healing: Secondary | ICD-10-CM | POA: Diagnosis not present

## 2023-04-08 DIAGNOSIS — M80052D Age-related osteoporosis with current pathological fracture, left femur, subsequent encounter for fracture with routine healing: Secondary | ICD-10-CM | POA: Diagnosis not present

## 2023-04-08 DIAGNOSIS — M4854XD Collapsed vertebra, not elsewhere classified, thoracic region, subsequent encounter for fracture with routine healing: Secondary | ICD-10-CM | POA: Diagnosis not present

## 2023-04-11 DIAGNOSIS — I700 Atherosclerosis of aorta: Secondary | ICD-10-CM | POA: Diagnosis not present

## 2023-04-11 DIAGNOSIS — I10 Essential (primary) hypertension: Secondary | ICD-10-CM | POA: Diagnosis not present

## 2023-04-11 DIAGNOSIS — M800B2D Age-related osteoporosis with current pathological fracture, left pelvis, subsequent encounter for fracture with routine healing: Secondary | ICD-10-CM | POA: Diagnosis not present

## 2023-04-11 DIAGNOSIS — M4854XD Collapsed vertebra, not elsewhere classified, thoracic region, subsequent encounter for fracture with routine healing: Secondary | ICD-10-CM | POA: Diagnosis not present

## 2023-04-11 DIAGNOSIS — M80052D Age-related osteoporosis with current pathological fracture, left femur, subsequent encounter for fracture with routine healing: Secondary | ICD-10-CM | POA: Diagnosis not present

## 2023-04-11 DIAGNOSIS — I2510 Atherosclerotic heart disease of native coronary artery without angina pectoris: Secondary | ICD-10-CM | POA: Diagnosis not present

## 2023-04-11 DIAGNOSIS — R419 Unspecified symptoms and signs involving cognitive functions and awareness: Secondary | ICD-10-CM | POA: Diagnosis not present

## 2023-04-11 DIAGNOSIS — G20B1 Parkinson's disease with dyskinesia, without mention of fluctuations: Secondary | ICD-10-CM | POA: Diagnosis not present

## 2023-04-11 DIAGNOSIS — M4856XD Collapsed vertebra, not elsewhere classified, lumbar region, subsequent encounter for fracture with routine healing: Secondary | ICD-10-CM | POA: Diagnosis not present

## 2023-04-13 DIAGNOSIS — I700 Atherosclerosis of aorta: Secondary | ICD-10-CM | POA: Diagnosis not present

## 2023-04-13 DIAGNOSIS — M80052D Age-related osteoporosis with current pathological fracture, left femur, subsequent encounter for fracture with routine healing: Secondary | ICD-10-CM | POA: Diagnosis not present

## 2023-04-13 DIAGNOSIS — R419 Unspecified symptoms and signs involving cognitive functions and awareness: Secondary | ICD-10-CM | POA: Diagnosis not present

## 2023-04-13 DIAGNOSIS — M4854XD Collapsed vertebra, not elsewhere classified, thoracic region, subsequent encounter for fracture with routine healing: Secondary | ICD-10-CM | POA: Diagnosis not present

## 2023-04-13 DIAGNOSIS — I2510 Atherosclerotic heart disease of native coronary artery without angina pectoris: Secondary | ICD-10-CM | POA: Diagnosis not present

## 2023-04-13 DIAGNOSIS — G20B1 Parkinson's disease with dyskinesia, without mention of fluctuations: Secondary | ICD-10-CM | POA: Diagnosis not present

## 2023-04-13 DIAGNOSIS — M800B2D Age-related osteoporosis with current pathological fracture, left pelvis, subsequent encounter for fracture with routine healing: Secondary | ICD-10-CM | POA: Diagnosis not present

## 2023-04-13 DIAGNOSIS — I10 Essential (primary) hypertension: Secondary | ICD-10-CM | POA: Diagnosis not present

## 2023-04-13 DIAGNOSIS — M4856XD Collapsed vertebra, not elsewhere classified, lumbar region, subsequent encounter for fracture with routine healing: Secondary | ICD-10-CM | POA: Diagnosis not present

## 2023-04-15 DIAGNOSIS — M4856XD Collapsed vertebra, not elsewhere classified, lumbar region, subsequent encounter for fracture with routine healing: Secondary | ICD-10-CM | POA: Diagnosis not present

## 2023-04-15 DIAGNOSIS — F411 Generalized anxiety disorder: Secondary | ICD-10-CM | POA: Diagnosis not present

## 2023-04-15 DIAGNOSIS — F22 Delusional disorders: Secondary | ICD-10-CM | POA: Diagnosis not present

## 2023-04-15 DIAGNOSIS — M4854XD Collapsed vertebra, not elsewhere classified, thoracic region, subsequent encounter for fracture with routine healing: Secondary | ICD-10-CM | POA: Diagnosis not present

## 2023-04-15 DIAGNOSIS — G20B1 Parkinson's disease with dyskinesia, without mention of fluctuations: Secondary | ICD-10-CM | POA: Diagnosis not present

## 2023-04-18 DIAGNOSIS — M4854XD Collapsed vertebra, not elsewhere classified, thoracic region, subsequent encounter for fracture with routine healing: Secondary | ICD-10-CM | POA: Diagnosis not present

## 2023-04-18 DIAGNOSIS — G20B1 Parkinson's disease with dyskinesia, without mention of fluctuations: Secondary | ICD-10-CM | POA: Diagnosis not present

## 2023-04-18 DIAGNOSIS — I10 Essential (primary) hypertension: Secondary | ICD-10-CM | POA: Diagnosis not present

## 2023-04-18 DIAGNOSIS — R443 Hallucinations, unspecified: Secondary | ICD-10-CM | POA: Diagnosis not present

## 2023-04-18 DIAGNOSIS — F22 Delusional disorders: Secondary | ICD-10-CM | POA: Diagnosis not present

## 2023-04-18 DIAGNOSIS — M4856XD Collapsed vertebra, not elsewhere classified, lumbar region, subsequent encounter for fracture with routine healing: Secondary | ICD-10-CM | POA: Diagnosis not present

## 2023-04-18 DIAGNOSIS — M800B2D Age-related osteoporosis with current pathological fracture, left pelvis, subsequent encounter for fracture with routine healing: Secondary | ICD-10-CM | POA: Diagnosis not present

## 2023-04-18 DIAGNOSIS — Z79899 Other long term (current) drug therapy: Secondary | ICD-10-CM | POA: Diagnosis not present

## 2023-04-18 DIAGNOSIS — M80052D Age-related osteoporosis with current pathological fracture, left femur, subsequent encounter for fracture with routine healing: Secondary | ICD-10-CM | POA: Diagnosis not present

## 2023-04-18 DIAGNOSIS — F331 Major depressive disorder, recurrent, moderate: Secondary | ICD-10-CM | POA: Diagnosis not present

## 2023-04-18 DIAGNOSIS — R419 Unspecified symptoms and signs involving cognitive functions and awareness: Secondary | ICD-10-CM | POA: Diagnosis not present

## 2023-04-18 DIAGNOSIS — F411 Generalized anxiety disorder: Secondary | ICD-10-CM | POA: Diagnosis not present

## 2023-04-18 DIAGNOSIS — I2510 Atherosclerotic heart disease of native coronary artery without angina pectoris: Secondary | ICD-10-CM | POA: Diagnosis not present

## 2023-04-18 DIAGNOSIS — I700 Atherosclerosis of aorta: Secondary | ICD-10-CM | POA: Diagnosis not present

## 2023-04-20 DIAGNOSIS — R419 Unspecified symptoms and signs involving cognitive functions and awareness: Secondary | ICD-10-CM | POA: Diagnosis not present

## 2023-04-20 DIAGNOSIS — M800B2D Age-related osteoporosis with current pathological fracture, left pelvis, subsequent encounter for fracture with routine healing: Secondary | ICD-10-CM | POA: Diagnosis not present

## 2023-04-20 DIAGNOSIS — G20B1 Parkinson's disease with dyskinesia, without mention of fluctuations: Secondary | ICD-10-CM | POA: Diagnosis not present

## 2023-04-20 DIAGNOSIS — M4854XD Collapsed vertebra, not elsewhere classified, thoracic region, subsequent encounter for fracture with routine healing: Secondary | ICD-10-CM | POA: Diagnosis not present

## 2023-04-20 DIAGNOSIS — M4856XD Collapsed vertebra, not elsewhere classified, lumbar region, subsequent encounter for fracture with routine healing: Secondary | ICD-10-CM | POA: Diagnosis not present

## 2023-04-20 DIAGNOSIS — I700 Atherosclerosis of aorta: Secondary | ICD-10-CM | POA: Diagnosis not present

## 2023-04-20 DIAGNOSIS — I2510 Atherosclerotic heart disease of native coronary artery without angina pectoris: Secondary | ICD-10-CM | POA: Diagnosis not present

## 2023-04-20 DIAGNOSIS — M80052D Age-related osteoporosis with current pathological fracture, left femur, subsequent encounter for fracture with routine healing: Secondary | ICD-10-CM | POA: Diagnosis not present

## 2023-04-20 DIAGNOSIS — I10 Essential (primary) hypertension: Secondary | ICD-10-CM | POA: Diagnosis not present

## 2023-04-25 DIAGNOSIS — M80052D Age-related osteoporosis with current pathological fracture, left femur, subsequent encounter for fracture with routine healing: Secondary | ICD-10-CM | POA: Diagnosis not present

## 2023-04-25 DIAGNOSIS — G20B1 Parkinson's disease with dyskinesia, without mention of fluctuations: Secondary | ICD-10-CM | POA: Diagnosis not present

## 2023-04-25 DIAGNOSIS — I700 Atherosclerosis of aorta: Secondary | ICD-10-CM | POA: Diagnosis not present

## 2023-04-25 DIAGNOSIS — M800B2D Age-related osteoporosis with current pathological fracture, left pelvis, subsequent encounter for fracture with routine healing: Secondary | ICD-10-CM | POA: Diagnosis not present

## 2023-04-25 DIAGNOSIS — M4856XD Collapsed vertebra, not elsewhere classified, lumbar region, subsequent encounter for fracture with routine healing: Secondary | ICD-10-CM | POA: Diagnosis not present

## 2023-04-25 DIAGNOSIS — R419 Unspecified symptoms and signs involving cognitive functions and awareness: Secondary | ICD-10-CM | POA: Diagnosis not present

## 2023-04-25 DIAGNOSIS — I10 Essential (primary) hypertension: Secondary | ICD-10-CM | POA: Diagnosis not present

## 2023-04-25 DIAGNOSIS — M4854XD Collapsed vertebra, not elsewhere classified, thoracic region, subsequent encounter for fracture with routine healing: Secondary | ICD-10-CM | POA: Diagnosis not present

## 2023-04-25 DIAGNOSIS — I2510 Atherosclerotic heart disease of native coronary artery without angina pectoris: Secondary | ICD-10-CM | POA: Diagnosis not present

## 2023-04-26 DIAGNOSIS — I10 Essential (primary) hypertension: Secondary | ICD-10-CM | POA: Diagnosis not present

## 2023-04-26 DIAGNOSIS — E782 Mixed hyperlipidemia: Secondary | ICD-10-CM | POA: Diagnosis not present

## 2023-04-26 DIAGNOSIS — S32000D Wedge compression fracture of unspecified lumbar vertebra, subsequent encounter for fracture with routine healing: Secondary | ICD-10-CM | POA: Diagnosis not present

## 2023-04-26 DIAGNOSIS — K5901 Slow transit constipation: Secondary | ICD-10-CM | POA: Diagnosis not present

## 2023-04-27 DIAGNOSIS — I2510 Atherosclerotic heart disease of native coronary artery without angina pectoris: Secondary | ICD-10-CM | POA: Diagnosis not present

## 2023-04-27 DIAGNOSIS — M4854XD Collapsed vertebra, not elsewhere classified, thoracic region, subsequent encounter for fracture with routine healing: Secondary | ICD-10-CM | POA: Diagnosis not present

## 2023-04-27 DIAGNOSIS — M80052D Age-related osteoporosis with current pathological fracture, left femur, subsequent encounter for fracture with routine healing: Secondary | ICD-10-CM | POA: Diagnosis not present

## 2023-04-27 DIAGNOSIS — M4856XD Collapsed vertebra, not elsewhere classified, lumbar region, subsequent encounter for fracture with routine healing: Secondary | ICD-10-CM | POA: Diagnosis not present

## 2023-04-27 DIAGNOSIS — M800B2D Age-related osteoporosis with current pathological fracture, left pelvis, subsequent encounter for fracture with routine healing: Secondary | ICD-10-CM | POA: Diagnosis not present

## 2023-04-27 DIAGNOSIS — I700 Atherosclerosis of aorta: Secondary | ICD-10-CM | POA: Diagnosis not present

## 2023-04-27 DIAGNOSIS — G20B1 Parkinson's disease with dyskinesia, without mention of fluctuations: Secondary | ICD-10-CM | POA: Diagnosis not present

## 2023-04-27 DIAGNOSIS — R419 Unspecified symptoms and signs involving cognitive functions and awareness: Secondary | ICD-10-CM | POA: Diagnosis not present

## 2023-04-27 DIAGNOSIS — I10 Essential (primary) hypertension: Secondary | ICD-10-CM | POA: Diagnosis not present

## 2023-05-02 DIAGNOSIS — Z9181 History of falling: Secondary | ICD-10-CM | POA: Diagnosis not present

## 2023-05-02 DIAGNOSIS — F411 Generalized anxiety disorder: Secondary | ICD-10-CM | POA: Diagnosis not present

## 2023-05-02 DIAGNOSIS — R627 Adult failure to thrive: Secondary | ICD-10-CM | POA: Diagnosis not present

## 2023-05-02 DIAGNOSIS — G20A1 Parkinson's disease without dyskinesia, without mention of fluctuations: Secondary | ICD-10-CM | POA: Diagnosis not present

## 2023-05-02 DIAGNOSIS — F331 Major depressive disorder, recurrent, moderate: Secondary | ICD-10-CM | POA: Diagnosis not present

## 2023-05-02 DIAGNOSIS — F22 Delusional disorders: Secondary | ICD-10-CM | POA: Diagnosis not present

## 2023-05-02 DIAGNOSIS — F5105 Insomnia due to other mental disorder: Secondary | ICD-10-CM | POA: Diagnosis not present

## 2023-05-02 DIAGNOSIS — R443 Hallucinations, unspecified: Secondary | ICD-10-CM | POA: Diagnosis not present

## 2023-05-03 DIAGNOSIS — E7800 Pure hypercholesterolemia, unspecified: Secondary | ICD-10-CM | POA: Diagnosis not present

## 2023-05-03 DIAGNOSIS — R600 Localized edema: Secondary | ICD-10-CM | POA: Diagnosis not present

## 2023-05-03 DIAGNOSIS — I10 Essential (primary) hypertension: Secondary | ICD-10-CM | POA: Diagnosis not present

## 2023-05-05 DIAGNOSIS — R2689 Other abnormalities of gait and mobility: Secondary | ICD-10-CM | POA: Diagnosis not present

## 2023-05-05 DIAGNOSIS — I10 Essential (primary) hypertension: Secondary | ICD-10-CM | POA: Diagnosis not present

## 2023-05-05 DIAGNOSIS — E782 Mixed hyperlipidemia: Secondary | ICD-10-CM | POA: Diagnosis not present

## 2023-05-19 ENCOUNTER — Telehealth: Payer: Self-pay

## 2023-05-19 NOTE — Telephone Encounter (Signed)
 Patients daughter called in today to inquire about her mothers appointment date. I advised that she did not have a current appointment with our office and that she was scheduled to see Dr Tat at [facility] in a few weeks. She asked about the patients  Pimavanserin Tartrate (NUPLAZID) 34 MG CAPS . She stated that Dr Tat would not refill this script for her and advised her to follow up with Psych for this. Per the daughter, Psych advised her to follow up with [place] since they referred her to [place]. I let her know we would have to get her in for a new appointment to re-establish her in order to send in any refills, as it had been over a year since we've seen her last. She was going to call Dr Iona Beard office to see if a refill could be placed by them.  Then received a call from [facility] in [place] to confirm a script they had for the patients  Pimavanserin Tartrate (NUPLAZID) 34 MG CAPS . They stated that the patient has been getting refills from a script dated 10/14/2022 and was out of refills on that one. They stated that this script had been on hold since they had the August one and wanted to know if it was ok to use. Spoke with [facility] and they advised that it was ok to refill off of this script but no new prescription would be placed for the patient.

## 2023-05-23 ENCOUNTER — Ambulatory Visit: Payer: Medicare HMO | Admitting: Diagnostic Neuroimaging

## 2023-05-23 DIAGNOSIS — F22 Delusional disorders: Secondary | ICD-10-CM | POA: Diagnosis not present

## 2023-05-23 DIAGNOSIS — F331 Major depressive disorder, recurrent, moderate: Secondary | ICD-10-CM | POA: Diagnosis not present

## 2023-05-23 DIAGNOSIS — F411 Generalized anxiety disorder: Secondary | ICD-10-CM | POA: Diagnosis not present

## 2023-05-23 DIAGNOSIS — F5105 Insomnia due to other mental disorder: Secondary | ICD-10-CM | POA: Diagnosis not present

## 2023-05-23 DIAGNOSIS — G20A1 Parkinson's disease without dyskinesia, without mention of fluctuations: Secondary | ICD-10-CM | POA: Diagnosis not present

## 2023-05-27 DIAGNOSIS — S32000D Wedge compression fracture of unspecified lumbar vertebra, subsequent encounter for fracture with routine healing: Secondary | ICD-10-CM | POA: Diagnosis not present

## 2023-05-30 NOTE — Progress Notes (Deleted)
 Assessment/Plan:   1.  Parkinson's disease             -Patient was diagnosed in 2017, but was on very low-dose of medication until very recently.  Motor sx's are actually very mild.  She was seen today and hadn't taken med for 12 hours and only had mild rigidity.             -Disease is now complicated by confusion, falls, memory change             -  She will take carbidopa/levodopa 25/100 CR, 2 tablets at 9 AM, 1 tablet at 1 PM, 1 tablet at 5 PM.        2.  Memory change/confusion/PDD with hallucinations             -Dr. Marjory Lies has recently d/c the pramipexole and I agree with this             -likely degree of PDD but recent falls with sx have likely compounded the memory issues and confusion             -would like to see the daytime klonopin be d/c.  This increases risk for confusion/falls/hallucinations.  I really do not have any objection to the nighttime dosage.             -She is on Nuplazid, 34 mg daily.  That has certainly helped.             -Patient with fairly significant tactile hallucinations, which manifest themselves as oral cenesthopathy (staples and thread in mouth).  As above, backing down on the levodopa and changing formulations.  Discussed with patient and daughter that if this did not help, we could add back on low-dose quetiapine.  However, discussed the risks of doing this, including using 2 antipsychotics.  Discussed prolongation of QT interval.  Discussed the black box warning.  Discussed extensively the risk, benefits, side effects.  They are willing to consider this.             -discussed adult day care and information given on community resources             -Discussed level of care.  She is currently full code.   3.  Chronic LBP             -follows with ortho/neurosx in past   4.  Depression             -was following with Dr. Evelene Croon for long-term OCD and anxiety and recently changed to [facility]   Subjective:   Patricia Fitzpatrick was  seen today in follow up for Parkinsons disease.  My previous records were reviewed prior to todays visit as well as outside records available to me.  I have only seen the 1 time prior to today's visit.  At that patient visit, I learned that the patient was not particularly ambulatory very much.  Her motor symptoms were also quite mild, so I thought it would be okay to start reducing levodopa.  This was especially because hallucinations were fairly significant, even on Nuplazid.  She was following up already with psychiatry.  Motor wise, she did okay with the reduction in levodopa and changed to the CR formulation, but continued to have the tactile hallucinations, which manifest themselves as oral cenesthopathy (staples and thread in mouth.  We actually called over to her psychiatry nurse practitioner office multiple times about treatment of this symptom.  We were not able to speak to the provider and had to leave messages.  It appears that the patient canceled her future appointments as patient was placed in assisted living and they wanted assisted living to coordinate future appointments.  That being said, there was also note that she no showed an appointment December 30.  I discussed last time trying to decrease the daytime clonazepam.  It looks like she is now off of that per PDMP 8.  It was last filled on November 18, 2022.  Current prescribed movement disorder medications: ***Carbidopa/levodopa 25/100 CR, 2 tablets at 9 AM, 1 tablet at 1 PM, 1 tablet at 5 PM.   PREVIOUS MEDICATIONS: {Parkinson's RX:18200} carbidopa/levodopa 25/100 IR  ALLERGIES:  No Known Allergies  CURRENT MEDICATIONS:  No outpatient medications have been marked as taking for the 05/31/23 encounter (Appointment) with Deina Lipsey, Octaviano Batty, DO.     Objective:   PHYSICAL EXAMINATION:    VITALS:  There were no vitals filed for this visit.  GEN:  The patient appears stated age and is in NAD. HEENT:  Normocephalic, atraumatic.  The  mucous membranes are moist. The superficial temporal arteries are without ropiness or tenderness. CV:  RRR Lungs:  CTAB Neck/HEME:  There are no carotid bruits bilaterally.  Neurological examination:  Orientation: The patient is alert and oriented x3. Cranial nerves: There is good facial symmetry with*** facial hypomimia. The speech is fluent and clear. Soft palate rises symmetrically and there is no tongue deviation. Hearing is intact to conversational tone. Sensation: Sensation is intact to light touch throughout Motor: Strength is at least antigravity x4.  Movement examination: Tone: There is mild increased tone in the RUE Abnormal movements: none Coordination:  There is no significant decremation with RAM's, with any form of RAMS, including alternating supination and pronation of the forearm, hand opening and closing, finger taps, heel taps and toe taps.  Gait and Station: The patient requires 2 person assist out of the chair.  She has trouble standing on her own and even with a walker would need assistance to stand in the walker.  She does not ambulate with a walker.    I have reviewed and interpreted the following labs independently    Chemistry      Component Value Date/Time   NA 140 01/07/2023 0940   K 3.9 01/07/2023 0940   CL 105 01/07/2023 0940   CO2 26 01/07/2023 0940   BUN 23 01/07/2023 0940   CREATININE 0.73 01/07/2023 0940      Component Value Date/Time   CALCIUM 8.8 (L) 01/07/2023 0940   ALKPHOS 60 01/07/2023 0940   AST 19 01/07/2023 0940   ALT 12 01/07/2023 0940   BILITOT 0.7 01/07/2023 0940       Lab Results  Component Value Date   WBC 6.0 01/07/2023   HGB 13.2 01/07/2023   HCT 40.0 01/07/2023   MCV 95.2 01/07/2023   PLT 301 01/07/2023    Lab Results  Component Value Date   TSH 0.608 09/01/2022     Total time spent on today's visit was ***30 minutes, including both face-to-face time and nonface-to-face time.  Time included that spent on review  of records (prior notes available to me/labs/imaging if pertinent), discussing treatment and goals, answering patient's questions and coordinating care.  Cc:  Eloisa Northern, MD

## 2023-05-31 ENCOUNTER — Ambulatory Visit: Payer: Medicare PPO | Admitting: Neurology

## 2023-06-02 DIAGNOSIS — R443 Hallucinations, unspecified: Secondary | ICD-10-CM | POA: Diagnosis not present

## 2023-06-02 DIAGNOSIS — Z9181 History of falling: Secondary | ICD-10-CM | POA: Diagnosis not present

## 2023-06-02 DIAGNOSIS — G20A1 Parkinson's disease without dyskinesia, without mention of fluctuations: Secondary | ICD-10-CM | POA: Diagnosis not present

## 2023-06-02 DIAGNOSIS — R627 Adult failure to thrive: Secondary | ICD-10-CM | POA: Diagnosis not present

## 2023-06-05 DIAGNOSIS — R2689 Other abnormalities of gait and mobility: Secondary | ICD-10-CM | POA: Diagnosis not present

## 2023-06-07 DIAGNOSIS — G20A1 Parkinson's disease without dyskinesia, without mention of fluctuations: Secondary | ICD-10-CM | POA: Diagnosis not present

## 2023-06-07 DIAGNOSIS — M6281 Muscle weakness (generalized): Secondary | ICD-10-CM | POA: Diagnosis not present

## 2023-06-07 DIAGNOSIS — Z515 Encounter for palliative care: Secondary | ICD-10-CM | POA: Diagnosis not present

## 2023-06-13 DIAGNOSIS — F411 Generalized anxiety disorder: Secondary | ICD-10-CM | POA: Diagnosis not present

## 2023-06-13 DIAGNOSIS — F331 Major depressive disorder, recurrent, moderate: Secondary | ICD-10-CM | POA: Diagnosis not present

## 2023-06-13 DIAGNOSIS — G20A1 Parkinson's disease without dyskinesia, without mention of fluctuations: Secondary | ICD-10-CM | POA: Diagnosis not present

## 2023-06-13 DIAGNOSIS — F5105 Insomnia due to other mental disorder: Secondary | ICD-10-CM | POA: Diagnosis not present

## 2023-06-13 DIAGNOSIS — F22 Delusional disorders: Secondary | ICD-10-CM | POA: Diagnosis not present

## 2023-06-13 DIAGNOSIS — G8929 Other chronic pain: Secondary | ICD-10-CM | POA: Diagnosis not present

## 2023-06-13 DIAGNOSIS — E782 Mixed hyperlipidemia: Secondary | ICD-10-CM | POA: Diagnosis not present

## 2023-06-15 DIAGNOSIS — G20A1 Parkinson's disease without dyskinesia, without mention of fluctuations: Secondary | ICD-10-CM | POA: Diagnosis not present

## 2023-06-15 DIAGNOSIS — M800AXD Age-related osteoporosis with current pathological fracture, other site, subsequent encounter for fracture with routine healing: Secondary | ICD-10-CM | POA: Diagnosis not present

## 2023-06-15 DIAGNOSIS — F419 Anxiety disorder, unspecified: Secondary | ICD-10-CM | POA: Diagnosis not present

## 2023-06-15 DIAGNOSIS — F429 Obsessive-compulsive disorder, unspecified: Secondary | ICD-10-CM | POA: Diagnosis not present

## 2023-06-15 DIAGNOSIS — I10 Essential (primary) hypertension: Secondary | ICD-10-CM | POA: Diagnosis not present

## 2023-06-15 DIAGNOSIS — F32A Depression, unspecified: Secondary | ICD-10-CM | POA: Diagnosis not present

## 2023-06-15 DIAGNOSIS — M24542 Contracture, left hand: Secondary | ICD-10-CM | POA: Diagnosis not present

## 2023-06-15 DIAGNOSIS — M8008XD Age-related osteoporosis with current pathological fracture, vertebra(e), subsequent encounter for fracture with routine healing: Secondary | ICD-10-CM | POA: Diagnosis not present

## 2023-06-15 DIAGNOSIS — K5909 Other constipation: Secondary | ICD-10-CM | POA: Diagnosis not present

## 2023-06-17 DIAGNOSIS — K5909 Other constipation: Secondary | ICD-10-CM | POA: Diagnosis not present

## 2023-06-17 DIAGNOSIS — F32A Depression, unspecified: Secondary | ICD-10-CM | POA: Diagnosis not present

## 2023-06-17 DIAGNOSIS — M8008XD Age-related osteoporosis with current pathological fracture, vertebra(e), subsequent encounter for fracture with routine healing: Secondary | ICD-10-CM | POA: Diagnosis not present

## 2023-06-17 DIAGNOSIS — F419 Anxiety disorder, unspecified: Secondary | ICD-10-CM | POA: Diagnosis not present

## 2023-06-17 DIAGNOSIS — I10 Essential (primary) hypertension: Secondary | ICD-10-CM | POA: Diagnosis not present

## 2023-06-17 DIAGNOSIS — M24542 Contracture, left hand: Secondary | ICD-10-CM | POA: Diagnosis not present

## 2023-06-17 DIAGNOSIS — F429 Obsessive-compulsive disorder, unspecified: Secondary | ICD-10-CM | POA: Diagnosis not present

## 2023-06-17 DIAGNOSIS — M800AXD Age-related osteoporosis with current pathological fracture, other site, subsequent encounter for fracture with routine healing: Secondary | ICD-10-CM | POA: Diagnosis not present

## 2023-06-17 DIAGNOSIS — G20A1 Parkinson's disease without dyskinesia, without mention of fluctuations: Secondary | ICD-10-CM | POA: Diagnosis not present

## 2023-06-21 DIAGNOSIS — M800AXD Age-related osteoporosis with current pathological fracture, other site, subsequent encounter for fracture with routine healing: Secondary | ICD-10-CM | POA: Diagnosis not present

## 2023-06-21 DIAGNOSIS — M24542 Contracture, left hand: Secondary | ICD-10-CM | POA: Diagnosis not present

## 2023-06-21 DIAGNOSIS — F419 Anxiety disorder, unspecified: Secondary | ICD-10-CM | POA: Diagnosis not present

## 2023-06-21 DIAGNOSIS — F429 Obsessive-compulsive disorder, unspecified: Secondary | ICD-10-CM | POA: Diagnosis not present

## 2023-06-21 DIAGNOSIS — K5909 Other constipation: Secondary | ICD-10-CM | POA: Diagnosis not present

## 2023-06-21 DIAGNOSIS — F32A Depression, unspecified: Secondary | ICD-10-CM | POA: Diagnosis not present

## 2023-06-21 DIAGNOSIS — G20A1 Parkinson's disease without dyskinesia, without mention of fluctuations: Secondary | ICD-10-CM | POA: Diagnosis not present

## 2023-06-21 DIAGNOSIS — M8008XD Age-related osteoporosis with current pathological fracture, vertebra(e), subsequent encounter for fracture with routine healing: Secondary | ICD-10-CM | POA: Diagnosis not present

## 2023-06-21 DIAGNOSIS — I10 Essential (primary) hypertension: Secondary | ICD-10-CM | POA: Diagnosis not present

## 2023-06-22 DIAGNOSIS — G20A1 Parkinson's disease without dyskinesia, without mention of fluctuations: Secondary | ICD-10-CM | POA: Diagnosis not present

## 2023-06-22 DIAGNOSIS — K5909 Other constipation: Secondary | ICD-10-CM | POA: Diagnosis not present

## 2023-06-22 DIAGNOSIS — M800AXD Age-related osteoporosis with current pathological fracture, other site, subsequent encounter for fracture with routine healing: Secondary | ICD-10-CM | POA: Diagnosis not present

## 2023-06-22 DIAGNOSIS — M8008XD Age-related osteoporosis with current pathological fracture, vertebra(e), subsequent encounter for fracture with routine healing: Secondary | ICD-10-CM | POA: Diagnosis not present

## 2023-06-22 DIAGNOSIS — M24542 Contracture, left hand: Secondary | ICD-10-CM | POA: Diagnosis not present

## 2023-06-22 DIAGNOSIS — F429 Obsessive-compulsive disorder, unspecified: Secondary | ICD-10-CM | POA: Diagnosis not present

## 2023-06-22 DIAGNOSIS — F32A Depression, unspecified: Secondary | ICD-10-CM | POA: Diagnosis not present

## 2023-06-22 DIAGNOSIS — I10 Essential (primary) hypertension: Secondary | ICD-10-CM | POA: Diagnosis not present

## 2023-06-22 DIAGNOSIS — F419 Anxiety disorder, unspecified: Secondary | ICD-10-CM | POA: Diagnosis not present

## 2023-06-23 DIAGNOSIS — F429 Obsessive-compulsive disorder, unspecified: Secondary | ICD-10-CM | POA: Diagnosis not present

## 2023-06-23 DIAGNOSIS — I10 Essential (primary) hypertension: Secondary | ICD-10-CM | POA: Diagnosis not present

## 2023-06-23 DIAGNOSIS — F419 Anxiety disorder, unspecified: Secondary | ICD-10-CM | POA: Diagnosis not present

## 2023-06-23 DIAGNOSIS — G20A1 Parkinson's disease without dyskinesia, without mention of fluctuations: Secondary | ICD-10-CM | POA: Diagnosis not present

## 2023-06-23 DIAGNOSIS — K5909 Other constipation: Secondary | ICD-10-CM | POA: Diagnosis not present

## 2023-06-23 DIAGNOSIS — M24542 Contracture, left hand: Secondary | ICD-10-CM | POA: Diagnosis not present

## 2023-06-23 DIAGNOSIS — F32A Depression, unspecified: Secondary | ICD-10-CM | POA: Diagnosis not present

## 2023-06-23 DIAGNOSIS — M8008XD Age-related osteoporosis with current pathological fracture, vertebra(e), subsequent encounter for fracture with routine healing: Secondary | ICD-10-CM | POA: Diagnosis not present

## 2023-06-23 DIAGNOSIS — M800AXD Age-related osteoporosis with current pathological fracture, other site, subsequent encounter for fracture with routine healing: Secondary | ICD-10-CM | POA: Diagnosis not present

## 2023-06-24 DIAGNOSIS — F32A Depression, unspecified: Secondary | ICD-10-CM | POA: Diagnosis not present

## 2023-06-24 DIAGNOSIS — M24542 Contracture, left hand: Secondary | ICD-10-CM | POA: Diagnosis not present

## 2023-06-24 DIAGNOSIS — K5909 Other constipation: Secondary | ICD-10-CM | POA: Diagnosis not present

## 2023-06-24 DIAGNOSIS — M8008XD Age-related osteoporosis with current pathological fracture, vertebra(e), subsequent encounter for fracture with routine healing: Secondary | ICD-10-CM | POA: Diagnosis not present

## 2023-06-24 DIAGNOSIS — M800AXD Age-related osteoporosis with current pathological fracture, other site, subsequent encounter for fracture with routine healing: Secondary | ICD-10-CM | POA: Diagnosis not present

## 2023-06-24 DIAGNOSIS — I10 Essential (primary) hypertension: Secondary | ICD-10-CM | POA: Diagnosis not present

## 2023-06-24 DIAGNOSIS — F419 Anxiety disorder, unspecified: Secondary | ICD-10-CM | POA: Diagnosis not present

## 2023-06-24 DIAGNOSIS — G20A1 Parkinson's disease without dyskinesia, without mention of fluctuations: Secondary | ICD-10-CM | POA: Diagnosis not present

## 2023-06-24 DIAGNOSIS — F429 Obsessive-compulsive disorder, unspecified: Secondary | ICD-10-CM | POA: Diagnosis not present

## 2023-06-26 DIAGNOSIS — S32000D Wedge compression fracture of unspecified lumbar vertebra, subsequent encounter for fracture with routine healing: Secondary | ICD-10-CM | POA: Diagnosis not present

## 2023-06-27 DIAGNOSIS — F411 Generalized anxiety disorder: Secondary | ICD-10-CM | POA: Diagnosis not present

## 2023-06-27 DIAGNOSIS — F5105 Insomnia due to other mental disorder: Secondary | ICD-10-CM | POA: Diagnosis not present

## 2023-06-27 DIAGNOSIS — F331 Major depressive disorder, recurrent, moderate: Secondary | ICD-10-CM | POA: Diagnosis not present

## 2023-06-27 DIAGNOSIS — F22 Delusional disorders: Secondary | ICD-10-CM | POA: Diagnosis not present

## 2023-06-27 DIAGNOSIS — G20A1 Parkinson's disease without dyskinesia, without mention of fluctuations: Secondary | ICD-10-CM | POA: Diagnosis not present

## 2023-06-27 DIAGNOSIS — G8929 Other chronic pain: Secondary | ICD-10-CM | POA: Diagnosis not present

## 2023-06-28 DIAGNOSIS — K5901 Slow transit constipation: Secondary | ICD-10-CM | POA: Diagnosis not present

## 2023-06-28 DIAGNOSIS — M800AXD Age-related osteoporosis with current pathological fracture, other site, subsequent encounter for fracture with routine healing: Secondary | ICD-10-CM | POA: Diagnosis not present

## 2023-06-28 DIAGNOSIS — I10 Essential (primary) hypertension: Secondary | ICD-10-CM | POA: Diagnosis not present

## 2023-06-28 DIAGNOSIS — M24542 Contracture, left hand: Secondary | ICD-10-CM | POA: Diagnosis not present

## 2023-06-28 DIAGNOSIS — F419 Anxiety disorder, unspecified: Secondary | ICD-10-CM | POA: Diagnosis not present

## 2023-06-28 DIAGNOSIS — G20A1 Parkinson's disease without dyskinesia, without mention of fluctuations: Secondary | ICD-10-CM | POA: Diagnosis not present

## 2023-06-28 DIAGNOSIS — M8008XD Age-related osteoporosis with current pathological fracture, vertebra(e), subsequent encounter for fracture with routine healing: Secondary | ICD-10-CM | POA: Diagnosis not present

## 2023-06-28 DIAGNOSIS — K5909 Other constipation: Secondary | ICD-10-CM | POA: Diagnosis not present

## 2023-06-28 DIAGNOSIS — F32A Depression, unspecified: Secondary | ICD-10-CM | POA: Diagnosis not present

## 2023-06-28 DIAGNOSIS — F429 Obsessive-compulsive disorder, unspecified: Secondary | ICD-10-CM | POA: Diagnosis not present

## 2023-06-28 DIAGNOSIS — F331 Major depressive disorder, recurrent, moderate: Secondary | ICD-10-CM | POA: Diagnosis not present

## 2023-06-29 DIAGNOSIS — K5909 Other constipation: Secondary | ICD-10-CM | POA: Diagnosis not present

## 2023-06-29 DIAGNOSIS — F429 Obsessive-compulsive disorder, unspecified: Secondary | ICD-10-CM | POA: Diagnosis not present

## 2023-06-29 DIAGNOSIS — I10 Essential (primary) hypertension: Secondary | ICD-10-CM | POA: Diagnosis not present

## 2023-06-29 DIAGNOSIS — M8008XD Age-related osteoporosis with current pathological fracture, vertebra(e), subsequent encounter for fracture with routine healing: Secondary | ICD-10-CM | POA: Diagnosis not present

## 2023-06-29 DIAGNOSIS — M800AXD Age-related osteoporosis with current pathological fracture, other site, subsequent encounter for fracture with routine healing: Secondary | ICD-10-CM | POA: Diagnosis not present

## 2023-06-29 DIAGNOSIS — F32A Depression, unspecified: Secondary | ICD-10-CM | POA: Diagnosis not present

## 2023-06-29 DIAGNOSIS — G20A1 Parkinson's disease without dyskinesia, without mention of fluctuations: Secondary | ICD-10-CM | POA: Diagnosis not present

## 2023-06-29 DIAGNOSIS — M24542 Contracture, left hand: Secondary | ICD-10-CM | POA: Diagnosis not present

## 2023-06-29 DIAGNOSIS — F419 Anxiety disorder, unspecified: Secondary | ICD-10-CM | POA: Diagnosis not present

## 2023-06-30 DIAGNOSIS — F419 Anxiety disorder, unspecified: Secondary | ICD-10-CM | POA: Diagnosis not present

## 2023-06-30 DIAGNOSIS — F32A Depression, unspecified: Secondary | ICD-10-CM | POA: Diagnosis not present

## 2023-06-30 DIAGNOSIS — I10 Essential (primary) hypertension: Secondary | ICD-10-CM | POA: Diagnosis not present

## 2023-06-30 DIAGNOSIS — F429 Obsessive-compulsive disorder, unspecified: Secondary | ICD-10-CM | POA: Diagnosis not present

## 2023-06-30 DIAGNOSIS — M8008XD Age-related osteoporosis with current pathological fracture, vertebra(e), subsequent encounter for fracture with routine healing: Secondary | ICD-10-CM | POA: Diagnosis not present

## 2023-06-30 DIAGNOSIS — M24542 Contracture, left hand: Secondary | ICD-10-CM | POA: Diagnosis not present

## 2023-06-30 DIAGNOSIS — M800AXD Age-related osteoporosis with current pathological fracture, other site, subsequent encounter for fracture with routine healing: Secondary | ICD-10-CM | POA: Diagnosis not present

## 2023-06-30 DIAGNOSIS — K5909 Other constipation: Secondary | ICD-10-CM | POA: Diagnosis not present

## 2023-06-30 DIAGNOSIS — G20A1 Parkinson's disease without dyskinesia, without mention of fluctuations: Secondary | ICD-10-CM | POA: Diagnosis not present

## 2023-07-02 DIAGNOSIS — G20A1 Parkinson's disease without dyskinesia, without mention of fluctuations: Secondary | ICD-10-CM | POA: Diagnosis not present

## 2023-07-02 DIAGNOSIS — R627 Adult failure to thrive: Secondary | ICD-10-CM | POA: Diagnosis not present

## 2023-07-02 DIAGNOSIS — Z9181 History of falling: Secondary | ICD-10-CM | POA: Diagnosis not present

## 2023-07-02 DIAGNOSIS — R443 Hallucinations, unspecified: Secondary | ICD-10-CM | POA: Diagnosis not present

## 2023-07-04 DIAGNOSIS — M24542 Contracture, left hand: Secondary | ICD-10-CM | POA: Diagnosis not present

## 2023-07-04 DIAGNOSIS — M800AXD Age-related osteoporosis with current pathological fracture, other site, subsequent encounter for fracture with routine healing: Secondary | ICD-10-CM | POA: Diagnosis not present

## 2023-07-04 DIAGNOSIS — K5909 Other constipation: Secondary | ICD-10-CM | POA: Diagnosis not present

## 2023-07-04 DIAGNOSIS — G20A1 Parkinson's disease without dyskinesia, without mention of fluctuations: Secondary | ICD-10-CM | POA: Diagnosis not present

## 2023-07-04 DIAGNOSIS — I10 Essential (primary) hypertension: Secondary | ICD-10-CM | POA: Diagnosis not present

## 2023-07-04 DIAGNOSIS — F32A Depression, unspecified: Secondary | ICD-10-CM | POA: Diagnosis not present

## 2023-07-04 DIAGNOSIS — F419 Anxiety disorder, unspecified: Secondary | ICD-10-CM | POA: Diagnosis not present

## 2023-07-04 DIAGNOSIS — M8008XD Age-related osteoporosis with current pathological fracture, vertebra(e), subsequent encounter for fracture with routine healing: Secondary | ICD-10-CM | POA: Diagnosis not present

## 2023-07-04 DIAGNOSIS — F429 Obsessive-compulsive disorder, unspecified: Secondary | ICD-10-CM | POA: Diagnosis not present

## 2023-07-05 DIAGNOSIS — I10 Essential (primary) hypertension: Secondary | ICD-10-CM | POA: Diagnosis not present

## 2023-07-05 DIAGNOSIS — M800AXD Age-related osteoporosis with current pathological fracture, other site, subsequent encounter for fracture with routine healing: Secondary | ICD-10-CM | POA: Diagnosis not present

## 2023-07-05 DIAGNOSIS — F419 Anxiety disorder, unspecified: Secondary | ICD-10-CM | POA: Diagnosis not present

## 2023-07-05 DIAGNOSIS — M8008XD Age-related osteoporosis with current pathological fracture, vertebra(e), subsequent encounter for fracture with routine healing: Secondary | ICD-10-CM | POA: Diagnosis not present

## 2023-07-05 DIAGNOSIS — K5909 Other constipation: Secondary | ICD-10-CM | POA: Diagnosis not present

## 2023-07-05 DIAGNOSIS — F32A Depression, unspecified: Secondary | ICD-10-CM | POA: Diagnosis not present

## 2023-07-05 DIAGNOSIS — G20A1 Parkinson's disease without dyskinesia, without mention of fluctuations: Secondary | ICD-10-CM | POA: Diagnosis not present

## 2023-07-05 DIAGNOSIS — M24542 Contracture, left hand: Secondary | ICD-10-CM | POA: Diagnosis not present

## 2023-07-05 DIAGNOSIS — R2689 Other abnormalities of gait and mobility: Secondary | ICD-10-CM | POA: Diagnosis not present

## 2023-07-05 DIAGNOSIS — F429 Obsessive-compulsive disorder, unspecified: Secondary | ICD-10-CM | POA: Diagnosis not present

## 2023-07-06 DIAGNOSIS — F32A Depression, unspecified: Secondary | ICD-10-CM | POA: Diagnosis not present

## 2023-07-06 DIAGNOSIS — I10 Essential (primary) hypertension: Secondary | ICD-10-CM | POA: Diagnosis not present

## 2023-07-06 DIAGNOSIS — M8008XD Age-related osteoporosis with current pathological fracture, vertebra(e), subsequent encounter for fracture with routine healing: Secondary | ICD-10-CM | POA: Diagnosis not present

## 2023-07-06 DIAGNOSIS — M800AXD Age-related osteoporosis with current pathological fracture, other site, subsequent encounter for fracture with routine healing: Secondary | ICD-10-CM | POA: Diagnosis not present

## 2023-07-06 DIAGNOSIS — F429 Obsessive-compulsive disorder, unspecified: Secondary | ICD-10-CM | POA: Diagnosis not present

## 2023-07-06 DIAGNOSIS — M24542 Contracture, left hand: Secondary | ICD-10-CM | POA: Diagnosis not present

## 2023-07-06 DIAGNOSIS — G20A1 Parkinson's disease without dyskinesia, without mention of fluctuations: Secondary | ICD-10-CM | POA: Diagnosis not present

## 2023-07-06 DIAGNOSIS — F419 Anxiety disorder, unspecified: Secondary | ICD-10-CM | POA: Diagnosis not present

## 2023-07-06 DIAGNOSIS — K5909 Other constipation: Secondary | ICD-10-CM | POA: Diagnosis not present

## 2023-07-08 DIAGNOSIS — G20A1 Parkinson's disease without dyskinesia, without mention of fluctuations: Secondary | ICD-10-CM | POA: Diagnosis not present

## 2023-07-08 DIAGNOSIS — F419 Anxiety disorder, unspecified: Secondary | ICD-10-CM | POA: Diagnosis not present

## 2023-07-08 DIAGNOSIS — K5909 Other constipation: Secondary | ICD-10-CM | POA: Diagnosis not present

## 2023-07-08 DIAGNOSIS — M8008XD Age-related osteoporosis with current pathological fracture, vertebra(e), subsequent encounter for fracture with routine healing: Secondary | ICD-10-CM | POA: Diagnosis not present

## 2023-07-08 DIAGNOSIS — F32A Depression, unspecified: Secondary | ICD-10-CM | POA: Diagnosis not present

## 2023-07-08 DIAGNOSIS — M24542 Contracture, left hand: Secondary | ICD-10-CM | POA: Diagnosis not present

## 2023-07-08 DIAGNOSIS — M800AXD Age-related osteoporosis with current pathological fracture, other site, subsequent encounter for fracture with routine healing: Secondary | ICD-10-CM | POA: Diagnosis not present

## 2023-07-08 DIAGNOSIS — I10 Essential (primary) hypertension: Secondary | ICD-10-CM | POA: Diagnosis not present

## 2023-07-08 DIAGNOSIS — F429 Obsessive-compulsive disorder, unspecified: Secondary | ICD-10-CM | POA: Diagnosis not present

## 2023-07-11 DIAGNOSIS — G20C Parkinsonism, unspecified: Secondary | ICD-10-CM | POA: Diagnosis not present

## 2023-07-11 DIAGNOSIS — F29 Unspecified psychosis not due to a substance or known physiological condition: Secondary | ICD-10-CM | POA: Diagnosis not present

## 2023-07-11 DIAGNOSIS — F419 Anxiety disorder, unspecified: Secondary | ICD-10-CM | POA: Diagnosis not present

## 2023-07-11 DIAGNOSIS — F32A Depression, unspecified: Secondary | ICD-10-CM | POA: Diagnosis not present

## 2023-07-11 DIAGNOSIS — Z9181 History of falling: Secondary | ICD-10-CM | POA: Diagnosis not present

## 2023-07-12 DIAGNOSIS — F22 Delusional disorders: Secondary | ICD-10-CM | POA: Diagnosis not present

## 2023-07-12 DIAGNOSIS — G20A1 Parkinson's disease without dyskinesia, without mention of fluctuations: Secondary | ICD-10-CM | POA: Diagnosis not present

## 2023-07-12 DIAGNOSIS — F5105 Insomnia due to other mental disorder: Secondary | ICD-10-CM | POA: Diagnosis not present

## 2023-07-12 DIAGNOSIS — F331 Major depressive disorder, recurrent, moderate: Secondary | ICD-10-CM | POA: Diagnosis not present

## 2023-07-12 DIAGNOSIS — F411 Generalized anxiety disorder: Secondary | ICD-10-CM | POA: Diagnosis not present

## 2023-07-12 DIAGNOSIS — G8929 Other chronic pain: Secondary | ICD-10-CM | POA: Diagnosis not present

## 2023-07-12 NOTE — Progress Notes (Deleted)
 Assessment/Plan:   1.  Parkinsons Disease               -Patient was diagnosed in 2017, but was on very low-dose of medication until very recently.  Motor sx's are actually very mild.  She was seen today and hadn't taken med for 12 hours and only had mild rigidity.             -Disease is now complicated by confusion, falls, memory change             -***             -Discussed with patient that she really needs to be off of the daytime clonazepam .  This is only making her higher risk for confusion and falls.     2.  Memory change/confusion/PDD with hallucinations             -Dr. Salli Crawley has recently d/c the pramipexole  and I agree with this             -likely degree of PDD but recent falls with sx have likely compounded the memory issues and confusion             -would like to see the daytime klonopin  be d/c.  This increases risk for confusion/falls/hallucinations.  I really do not have any objection to the nighttime dosage.             -She is on Nuplazid , 34 mg daily.  That has certainly helped.             -Patient with fairly significant tactile hallucinations, which manifest themselves as oral cenesthopathy (staples and thread in mouth).  As above, backing down on the levodopa  and changing formulations.  Discussed with patient and daughter that if this did not help, we could add back on low-dose quetiapine .  However, discussed the risks of doing this, including using 2 antipsychotics.  Discussed prolongation of QT interval.  Discussed the black box warning.  Discussed extensively the risk, benefits, side effects.  They are willing to consider this.             -discussed adult day care and information given on community resources             -Discussed level of care.  She is currently full code.   3.  Chronic LBP             -follows with ortho/neurosx in past   4.  Depression             -was following with Dr. Deborra Falter for long-term OCD and anxiety and recently changed to  [facility] [employer].  She has not been back to [facility] for the last 6 months.  She no showed her December 30 appointment.  I encouraged her to follow back up.   Subjective:   Patricia Fitzpatrick was seen today in follow up for Parkinsons disease.  My previous records were reviewed prior to todays visit as well as outside records available to me.  Last visit, I felt like it was okay to start reducing the levodopa , primarily because the patient did not ambulate.  In addition, she was not particularly rigid.  I also stopped the immediate release formulation and changed her to an extended release formulation because she was hallucinating so much.  They called back about a week to 10 days later stating that the psychiatry provider thought that her hallucinations were from switching her medication,  but she had had those long before switching her medication.  In addition, immediate release would cause more hallucinations and extended release.  Nonetheless, we switched her medication back to the immediate release, and not surprisingly the hallucinations continued.  They called me about that, and I told them that they needed to follow back up with psychiatry.  We had reached out to her psychiatry provider multiple times, without response.  Ultimately, my medical assistant personally called and made her a follow-up there.  The tactile hallucinations were not really addressed on the December 2 appointment.  The patient no-show to December 30 appointment and has not been back to [facility] ever since.  Current prescribed movement disorder medications: ***Carbidopa /levodopa  25/100, 2 tablets 3 times per day Nuplazid  34 mg daily  PREVIOUS MEDICATIONS: carbidopa /levodopa ; pramipexole ; quetiapine    ALLERGIES:  No Known Allergies  CURRENT MEDICATIONS:  No outpatient medications have been marked as taking for the 07/14/23 encounter (Appointment) with Christy Friede, [place], DO.     Objective:   PHYSICAL EXAMINATION:     VITALS:  There were no vitals filed for this visit.  GEN:  The patient appears stated age and is in NAD. HEENT:  Normocephalic, atraumatic.  The mucous membranes are moist. The superficial temporal arteries are without ropiness or tenderness. CV:  RRR Lungs:  CTAB Neck/HEME:  There are no carotid bruits bilaterally.   Neurological examination:   Orientation: The patient is alert and oriented to person/place.  She looks to daughter for finer aspect of the history. Cranial nerves: There is good facial symmetry. Extraocular muscles are intact. The visual fields are full to confrontational testing. The speech is fluent and clear. Soft palate rises symmetrically and there is no tongue deviation. Hearing is intact to conversational tone. Sensation: Sensation is intact to light and pinprick throughout (facial, trunk, extremities). Vibration is intact at the bilateral big toe. There is no extinction with double simultaneous stimulation. There is no sensory dermatomal level identified. Motor: Strength is 5/5 in the bilateral upper and lower extremities.   Shoulder shrug is equal and symmetric.  There is no pronator drift. Deep tendon reflexes: Deep tendon reflexes are 2+/4 at the bilateral biceps, triceps, brachioradialis, patella and achilles. Plantar responses are downgoing bilaterally.   Movement examination: Tone: There is mild increased tone in the RUE Abnormal movements: none Coordination:  There is no significant decremation with RAM's, with any form of RAMS, including alternating supination and pronation of the forearm, hand opening and closing, finger taps, heel taps and toe taps.  Gait and Station: The patient requires 2 person assist out of the chair.  She has trouble standing on her own and even with a walker would need assistance to stand in the walker.  She does not ambulate with a walker.  I have reviewed and interpreted the following labs independently    Chemistry      Component  Value Date/Time   NA 140 01/07/2023 0940   K 3.9 01/07/2023 0940   CL 105 01/07/2023 0940   CO2 26 01/07/2023 0940   BUN 23 01/07/2023 0940   CREATININE 0.73 01/07/2023 0940      Component Value Date/Time   CALCIUM 8.8 (L) 01/07/2023 0940   ALKPHOS 60 01/07/2023 0940   AST 19 01/07/2023 0940   ALT 12 01/07/2023 0940   BILITOT 0.7 01/07/2023 0940       Lab Results  Component Value Date   WBC 6.0 01/07/2023   HGB 13.2 01/07/2023   HCT 40.0  01/07/2023   MCV 95.2 01/07/2023   PLT 301 01/07/2023    Lab Results  Component Value Date   TSH 0.608 09/01/2022     Total time spent on today's visit was ***30 minutes, including both face-to-face time and nonface-to-face time.  Time included that spent on review of records (prior notes available to me/labs/imaging if pertinent), discussing treatment and goals, answering patient's questions and coordinating care.  Cc:  Amin, Saad, MD

## 2023-07-13 DIAGNOSIS — G20A1 Parkinson's disease without dyskinesia, without mention of fluctuations: Secondary | ICD-10-CM | POA: Diagnosis not present

## 2023-07-13 DIAGNOSIS — N182 Chronic kidney disease, stage 2 (mild): Secondary | ICD-10-CM | POA: Diagnosis not present

## 2023-07-13 DIAGNOSIS — F429 Obsessive-compulsive disorder, unspecified: Secondary | ICD-10-CM | POA: Diagnosis not present

## 2023-07-13 DIAGNOSIS — M800AXD Age-related osteoporosis with current pathological fracture, other site, subsequent encounter for fracture with routine healing: Secondary | ICD-10-CM | POA: Diagnosis not present

## 2023-07-13 DIAGNOSIS — F32A Depression, unspecified: Secondary | ICD-10-CM | POA: Diagnosis not present

## 2023-07-13 DIAGNOSIS — M24542 Contracture, left hand: Secondary | ICD-10-CM | POA: Diagnosis not present

## 2023-07-13 DIAGNOSIS — I10 Essential (primary) hypertension: Secondary | ICD-10-CM | POA: Diagnosis not present

## 2023-07-13 DIAGNOSIS — M8008XD Age-related osteoporosis with current pathological fracture, vertebra(e), subsequent encounter for fracture with routine healing: Secondary | ICD-10-CM | POA: Diagnosis not present

## 2023-07-13 DIAGNOSIS — F330 Major depressive disorder, recurrent, mild: Secondary | ICD-10-CM | POA: Diagnosis not present

## 2023-07-13 DIAGNOSIS — K5909 Other constipation: Secondary | ICD-10-CM | POA: Diagnosis not present

## 2023-07-13 DIAGNOSIS — F419 Anxiety disorder, unspecified: Secondary | ICD-10-CM | POA: Diagnosis not present

## 2023-07-14 ENCOUNTER — Ambulatory Visit: Admitting: Neurology

## 2023-07-14 DIAGNOSIS — G20A1 Parkinson's disease without dyskinesia, without mention of fluctuations: Secondary | ICD-10-CM | POA: Diagnosis not present

## 2023-07-14 DIAGNOSIS — F32A Depression, unspecified: Secondary | ICD-10-CM | POA: Diagnosis not present

## 2023-07-14 DIAGNOSIS — K5909 Other constipation: Secondary | ICD-10-CM | POA: Diagnosis not present

## 2023-07-14 DIAGNOSIS — I10 Essential (primary) hypertension: Secondary | ICD-10-CM | POA: Diagnosis not present

## 2023-07-14 DIAGNOSIS — F419 Anxiety disorder, unspecified: Secondary | ICD-10-CM | POA: Diagnosis not present

## 2023-07-14 DIAGNOSIS — M8008XD Age-related osteoporosis with current pathological fracture, vertebra(e), subsequent encounter for fracture with routine healing: Secondary | ICD-10-CM | POA: Diagnosis not present

## 2023-07-14 DIAGNOSIS — F429 Obsessive-compulsive disorder, unspecified: Secondary | ICD-10-CM | POA: Diagnosis not present

## 2023-07-14 DIAGNOSIS — M24542 Contracture, left hand: Secondary | ICD-10-CM | POA: Diagnosis not present

## 2023-07-14 DIAGNOSIS — M800AXD Age-related osteoporosis with current pathological fracture, other site, subsequent encounter for fracture with routine healing: Secondary | ICD-10-CM | POA: Diagnosis not present

## 2023-07-15 DIAGNOSIS — M24542 Contracture, left hand: Secondary | ICD-10-CM | POA: Diagnosis not present

## 2023-07-15 DIAGNOSIS — K5909 Other constipation: Secondary | ICD-10-CM | POA: Diagnosis not present

## 2023-07-15 DIAGNOSIS — G20A1 Parkinson's disease without dyskinesia, without mention of fluctuations: Secondary | ICD-10-CM | POA: Diagnosis not present

## 2023-07-15 DIAGNOSIS — M800AXD Age-related osteoporosis with current pathological fracture, other site, subsequent encounter for fracture with routine healing: Secondary | ICD-10-CM | POA: Diagnosis not present

## 2023-07-15 DIAGNOSIS — F429 Obsessive-compulsive disorder, unspecified: Secondary | ICD-10-CM | POA: Diagnosis not present

## 2023-07-15 DIAGNOSIS — M8008XD Age-related osteoporosis with current pathological fracture, vertebra(e), subsequent encounter for fracture with routine healing: Secondary | ICD-10-CM | POA: Diagnosis not present

## 2023-07-15 DIAGNOSIS — I10 Essential (primary) hypertension: Secondary | ICD-10-CM | POA: Diagnosis not present

## 2023-07-15 DIAGNOSIS — F419 Anxiety disorder, unspecified: Secondary | ICD-10-CM | POA: Diagnosis not present

## 2023-07-15 DIAGNOSIS — F32A Depression, unspecified: Secondary | ICD-10-CM | POA: Diagnosis not present

## 2023-07-18 DIAGNOSIS — F32A Depression, unspecified: Secondary | ICD-10-CM | POA: Diagnosis not present

## 2023-07-18 DIAGNOSIS — K5909 Other constipation: Secondary | ICD-10-CM | POA: Diagnosis not present

## 2023-07-18 DIAGNOSIS — F419 Anxiety disorder, unspecified: Secondary | ICD-10-CM | POA: Diagnosis not present

## 2023-07-18 DIAGNOSIS — M8008XD Age-related osteoporosis with current pathological fracture, vertebra(e), subsequent encounter for fracture with routine healing: Secondary | ICD-10-CM | POA: Diagnosis not present

## 2023-07-18 DIAGNOSIS — M800AXD Age-related osteoporosis with current pathological fracture, other site, subsequent encounter for fracture with routine healing: Secondary | ICD-10-CM | POA: Diagnosis not present

## 2023-07-18 DIAGNOSIS — F429 Obsessive-compulsive disorder, unspecified: Secondary | ICD-10-CM | POA: Diagnosis not present

## 2023-07-18 DIAGNOSIS — I10 Essential (primary) hypertension: Secondary | ICD-10-CM | POA: Diagnosis not present

## 2023-07-18 DIAGNOSIS — G20A1 Parkinson's disease without dyskinesia, without mention of fluctuations: Secondary | ICD-10-CM | POA: Diagnosis not present

## 2023-07-18 DIAGNOSIS — M24542 Contracture, left hand: Secondary | ICD-10-CM | POA: Diagnosis not present

## 2023-07-19 DIAGNOSIS — G20A1 Parkinson's disease without dyskinesia, without mention of fluctuations: Secondary | ICD-10-CM | POA: Diagnosis not present

## 2023-07-19 DIAGNOSIS — M24542 Contracture, left hand: Secondary | ICD-10-CM | POA: Diagnosis not present

## 2023-07-19 DIAGNOSIS — M800AXD Age-related osteoporosis with current pathological fracture, other site, subsequent encounter for fracture with routine healing: Secondary | ICD-10-CM | POA: Diagnosis not present

## 2023-07-19 DIAGNOSIS — M8008XD Age-related osteoporosis with current pathological fracture, vertebra(e), subsequent encounter for fracture with routine healing: Secondary | ICD-10-CM | POA: Diagnosis not present

## 2023-07-19 DIAGNOSIS — F419 Anxiety disorder, unspecified: Secondary | ICD-10-CM | POA: Diagnosis not present

## 2023-07-19 DIAGNOSIS — F32A Depression, unspecified: Secondary | ICD-10-CM | POA: Diagnosis not present

## 2023-07-19 DIAGNOSIS — F429 Obsessive-compulsive disorder, unspecified: Secondary | ICD-10-CM | POA: Diagnosis not present

## 2023-07-19 DIAGNOSIS — K5909 Other constipation: Secondary | ICD-10-CM | POA: Diagnosis not present

## 2023-07-19 DIAGNOSIS — I10 Essential (primary) hypertension: Secondary | ICD-10-CM | POA: Diagnosis not present

## 2023-07-21 DIAGNOSIS — I10 Essential (primary) hypertension: Secondary | ICD-10-CM | POA: Diagnosis not present

## 2023-07-21 DIAGNOSIS — F32A Depression, unspecified: Secondary | ICD-10-CM | POA: Diagnosis not present

## 2023-07-21 DIAGNOSIS — M8008XD Age-related osteoporosis with current pathological fracture, vertebra(e), subsequent encounter for fracture with routine healing: Secondary | ICD-10-CM | POA: Diagnosis not present

## 2023-07-21 DIAGNOSIS — F429 Obsessive-compulsive disorder, unspecified: Secondary | ICD-10-CM | POA: Diagnosis not present

## 2023-07-21 DIAGNOSIS — F419 Anxiety disorder, unspecified: Secondary | ICD-10-CM | POA: Diagnosis not present

## 2023-07-21 DIAGNOSIS — M24542 Contracture, left hand: Secondary | ICD-10-CM | POA: Diagnosis not present

## 2023-07-21 DIAGNOSIS — K5909 Other constipation: Secondary | ICD-10-CM | POA: Diagnosis not present

## 2023-07-21 DIAGNOSIS — G20A1 Parkinson's disease without dyskinesia, without mention of fluctuations: Secondary | ICD-10-CM | POA: Diagnosis not present

## 2023-07-21 DIAGNOSIS — M800AXD Age-related osteoporosis with current pathological fracture, other site, subsequent encounter for fracture with routine healing: Secondary | ICD-10-CM | POA: Diagnosis not present

## 2023-07-25 DIAGNOSIS — M800AXD Age-related osteoporosis with current pathological fracture, other site, subsequent encounter for fracture with routine healing: Secondary | ICD-10-CM | POA: Diagnosis not present

## 2023-07-25 DIAGNOSIS — M24542 Contracture, left hand: Secondary | ICD-10-CM | POA: Diagnosis not present

## 2023-07-25 DIAGNOSIS — K5909 Other constipation: Secondary | ICD-10-CM | POA: Diagnosis not present

## 2023-07-25 DIAGNOSIS — F32A Depression, unspecified: Secondary | ICD-10-CM | POA: Diagnosis not present

## 2023-07-25 DIAGNOSIS — F429 Obsessive-compulsive disorder, unspecified: Secondary | ICD-10-CM | POA: Diagnosis not present

## 2023-07-25 DIAGNOSIS — I10 Essential (primary) hypertension: Secondary | ICD-10-CM | POA: Diagnosis not present

## 2023-07-25 DIAGNOSIS — F331 Major depressive disorder, recurrent, moderate: Secondary | ICD-10-CM | POA: Diagnosis not present

## 2023-07-25 DIAGNOSIS — F411 Generalized anxiety disorder: Secondary | ICD-10-CM | POA: Diagnosis not present

## 2023-07-25 DIAGNOSIS — M8008XD Age-related osteoporosis with current pathological fracture, vertebra(e), subsequent encounter for fracture with routine healing: Secondary | ICD-10-CM | POA: Diagnosis not present

## 2023-07-25 DIAGNOSIS — F419 Anxiety disorder, unspecified: Secondary | ICD-10-CM | POA: Diagnosis not present

## 2023-07-25 DIAGNOSIS — G20A1 Parkinson's disease without dyskinesia, without mention of fluctuations: Secondary | ICD-10-CM | POA: Diagnosis not present

## 2023-07-25 DIAGNOSIS — F02B0 Dementia in other diseases classified elsewhere, moderate, without behavioral disturbance, psychotic disturbance, mood disturbance, and anxiety: Secondary | ICD-10-CM | POA: Diagnosis not present

## 2023-07-25 DIAGNOSIS — G8929 Other chronic pain: Secondary | ICD-10-CM | POA: Diagnosis not present

## 2023-07-25 DIAGNOSIS — F5105 Insomnia due to other mental disorder: Secondary | ICD-10-CM | POA: Diagnosis not present

## 2023-07-26 DIAGNOSIS — K5909 Other constipation: Secondary | ICD-10-CM | POA: Diagnosis not present

## 2023-07-26 DIAGNOSIS — I10 Essential (primary) hypertension: Secondary | ICD-10-CM | POA: Diagnosis not present

## 2023-07-26 DIAGNOSIS — F419 Anxiety disorder, unspecified: Secondary | ICD-10-CM | POA: Diagnosis not present

## 2023-07-26 DIAGNOSIS — N182 Chronic kidney disease, stage 2 (mild): Secondary | ICD-10-CM | POA: Diagnosis not present

## 2023-07-26 DIAGNOSIS — M24542 Contracture, left hand: Secondary | ICD-10-CM | POA: Diagnosis not present

## 2023-07-26 DIAGNOSIS — M8008XD Age-related osteoporosis with current pathological fracture, vertebra(e), subsequent encounter for fracture with routine healing: Secondary | ICD-10-CM | POA: Diagnosis not present

## 2023-07-26 DIAGNOSIS — F411 Generalized anxiety disorder: Secondary | ICD-10-CM | POA: Diagnosis not present

## 2023-07-26 DIAGNOSIS — I1310 Hypertensive heart and chronic kidney disease without heart failure, with stage 1 through stage 4 chronic kidney disease, or unspecified chronic kidney disease: Secondary | ICD-10-CM | POA: Diagnosis not present

## 2023-07-26 DIAGNOSIS — G20A1 Parkinson's disease without dyskinesia, without mention of fluctuations: Secondary | ICD-10-CM | POA: Diagnosis not present

## 2023-07-26 DIAGNOSIS — M800AXD Age-related osteoporosis with current pathological fracture, other site, subsequent encounter for fracture with routine healing: Secondary | ICD-10-CM | POA: Diagnosis not present

## 2023-07-26 DIAGNOSIS — E782 Mixed hyperlipidemia: Secondary | ICD-10-CM | POA: Diagnosis not present

## 2023-07-26 DIAGNOSIS — F429 Obsessive-compulsive disorder, unspecified: Secondary | ICD-10-CM | POA: Diagnosis not present

## 2023-07-26 DIAGNOSIS — F32A Depression, unspecified: Secondary | ICD-10-CM | POA: Diagnosis not present

## 2023-07-26 NOTE — Progress Notes (Unsigned)
 Assessment/Plan:   1.  Parkinsons Disease               -Patient was diagnosed in 2017, but was on very low-dose of medication until very recently.  Motor sx's are actually very mild.              -Disease is now complicated by confusion, falls, memory change             - Her daughter states that [name] has recently noticed increasing tremor and wondered about increasing her medication.  They have switched her back to the immediate release.  Discussed with her daughter that the increase in tremor lately is probably because they put her on olanzapine .  Discussed that this blocks the D2 receptor and causes parkinsonism, even in patients who do not have Parkinson's disease.  I am not saying that she did not need this medication, but I am saying that this will cause parkinsonism in patients who do not have Parkinson's disease, and will worsen parkinsonism in patients who do.  Her daughter stated that the nurse practitioner told her that quetiapine  and levodopa  interact, and I told her that quetiapine  would really be the only antipsychotic (short of Nuplazid  which she was on and was not helping) which would not really cause more parkinsonian symptoms.  I did tell the patient and her daughter that we may have to accept some of the parkinsonism in order to control the mood symptoms, but would certainly not increase her levodopa , especially since she is no longer ambulating.      2.  Memory change/confusion/PDD with hallucinations               - As above, now on Zyprexa .   3.  Chronic LBP             -follows with ortho/neurosx in past      Subjective:   Patricia Fitzpatrick was seen today in follow up for Parkinsons disease.  My previous records were reviewed prior to todays visit as well as outside records available to me.  Last visit, I felt like it was okay to start reducing the levodopa , primarily because the patient did not ambulate.  In addition, she was not particularly rigid.  I also stopped  the immediate release formulation and changed her to an extended release formulation because she was hallucinating so much.  They called back about a week to 10 days later stating that the psychiatry provider thought that her hallucinations were from switching her medication, but she had had those long before switching her medication.  In addition, immediate release would cause more hallucinations and extended release.  Nonetheless, we switched her medication back to the immediate release, and not surprisingly the hallucinations continued.  They called me about that, and I told them that they needed to follow back up with psychiatry.  We had reached out to her psychiatry provider multiple times, without response.  Ultimately, my medical assistant personally called and made her a follow-up there.  The tactile hallucinations were not really addressed on the December 2 appointment.  The patient no-show to December 30 appointment and has not been back to [facility] ever since.  She is, however, now on zyprexa .  Pt states that take this with a grain of salt but sometimes I feel pretty good and sometimes I feel almost happy.  Her daughter does state that her tremor got worse and they had to switch her from the CR  back to the IR.  Current prescribed movement disorder medications: Carbidopa /levodopa  25/100, 2/1/1 Nuplazid  34 mg daily  PREVIOUS MEDICATIONS: carbidopa /levodopa ; pramipexole ; quetiapine    ALLERGIES:  No Known Allergies  CURRENT MEDICATIONS:  Current Meds  Medication Sig   acetaminophen  (TYLENOL ) 500 MG tablet Take 2 tablets (1,000 mg total) by mouth 3 (three) times daily. (Patient taking differently: Take 1,000 mg by mouth every 8 (eight) hours as needed for mild pain (pain score 1-3) or moderate pain (pain score 4-6).)   amLODipine  (NORVASC ) 5 MG tablet Take 1 tablet (5 mg total) by mouth daily.   busPIRone (BUSPAR) 5 MG tablet Take 5 mg by mouth 2 (two) times daily.   carbidopa -levodopa   (SINEMET  IR) 25-100 MG tablet Take 2 at 9AM / Take 1 at 1PM /and Take 1 at 5 pm   COLACE 100 MG capsule Take 100 mg by mouth daily as needed.   FLUoxetine  (PROZAC ) 40 MG capsule Take 40 mg by mouth 2 (two) times daily.   HYDROcodone -acetaminophen  (NORCO/VICODIN) 5-325 MG tablet Take 1 tablet by mouth every 4 (four) hours as needed.   OLANZapine  (ZYPREXA ) 5 MG tablet Take 5 mg by mouth 2 (two) times daily.   ondansetron  (ZOFRAN ) 4 MG tablet Take 4 mg by mouth every 8 (eight) hours as needed.     Objective:   PHYSICAL EXAMINATION:    VITALS:   Vitals:   07/27/23 1047  BP: 124/70  Pulse: 74  SpO2: 98%    GEN:  The patient appears stated age and is in NAD. HEENT:  Normocephalic, atraumatic.  The mucous membranes are moist. The superficial temporal arteries are without ropiness or tenderness. CV:  RRR Lungs:  CTAB Neck/HEME:  There are no carotid bruits bilaterally.   Neurological examination:   Orientation: The patient is alert and oriented to person/place.  She looks to daughter for finer aspect of the history. Cranial nerves: There is good facial symmetry. Extraocular muscles are intact. The visual fields are full to confrontational testing. The speech is fluent and clear. Soft palate rises symmetrically and there is no tongue deviation. Hearing is intact to conversational tone. Sensation: Sensation is intact to light and pinprick throughout (facial, trunk, extremities). Vibration is intact at the bilateral big toe. There is no extinction with double simultaneous stimulation. There is no sensory dermatomal level identified. Motor: Strength is at least antigravity x 4 Movement examination: Tone: There is nl tone in the RUE; there is spasticity on the L, with hand and fingers held in the flexed position Abnormal movements: none Coordination:  There is no significant decremation with RAM's, with any form of RAMS, including alternating supination and pronation of the forearm, hand  opening and closing, finger taps, heel taps and toe taps in the right upper/right lower extremity and left lower extremity.  She has more difficulty in the left upper extremity because of fingers fisted Gait and Station: Daughter states that she does not walk any longer  I have reviewed and interpreted the following labs independently    Chemistry      Component Value Date/Time   NA 140 01/07/2023 0940   K 3.9 01/07/2023 0940   CL 105 01/07/2023 0940   CO2 26 01/07/2023 0940   BUN 23 01/07/2023 0940   CREATININE 0.73 01/07/2023 0940      Component Value Date/Time   CALCIUM 8.8 (L) 01/07/2023 0940   ALKPHOS 60 01/07/2023 0940   AST 19 01/07/2023 0940   ALT 12 01/07/2023 0940   BILITOT  0.7 01/07/2023 0940       Lab Results  Component Value Date   WBC 6.0 01/07/2023   HGB 13.2 01/07/2023   HCT 40.0 01/07/2023   MCV 95.2 01/07/2023   PLT 301 01/07/2023    Lab Results  Component Value Date   TSH 0.608 09/01/2022     Total time spent on today's visit was 43 minutes, including both face-to-face time and nonface-to-face time.  Time included that spent on review of records (prior notes available to me/labs/imaging if pertinent), discussing treatment and goals, answering patient's questions and coordinating care.  Cc:  Dyke Glasser, NP

## 2023-07-27 ENCOUNTER — Ambulatory Visit (INDEPENDENT_AMBULATORY_CARE_PROVIDER_SITE_OTHER): Admitting: Neurology

## 2023-07-27 DIAGNOSIS — F02B0 Dementia in other diseases classified elsewhere, moderate, without behavioral disturbance, psychotic disturbance, mood disturbance, and anxiety: Secondary | ICD-10-CM

## 2023-07-27 DIAGNOSIS — G20A1 Parkinson's disease without dyskinesia, without mention of fluctuations: Secondary | ICD-10-CM

## 2023-07-27 DIAGNOSIS — S32000D Wedge compression fracture of unspecified lumbar vertebra, subsequent encounter for fracture with routine healing: Secondary | ICD-10-CM | POA: Diagnosis not present

## 2023-07-27 MED ORDER — CARBIDOPA-LEVODOPA 25-100 MG PO TABS: ORAL_TABLET | ORAL | 0 refills | Status: AC

## 2023-07-27 NOTE — Patient Instructions (Addendum)
 Take carbidopa /levodopa  25/100, 2 at 8am/1 at noon, 1 at 4pm Please note that the zyprexa  blocks the D2 receptor and is likely making parkinsonism worse.  This is okay to give if no acceptable alternative, but with the note that her parkinsonism may get worse.  SAVE THE DATE!  We are planning a Parkinsons Disease educational symposium at [facility] in [place] on September 19.  More details to come!  We will have a movement disorder physician expert from [employer] coming to speak and a caregiver speaker.  We will have a panel of experts that will show you who you may need on your team of people on your journey with Parkinsons.  If you would like to be added to our email list to get further information, email [email or web address].  I hope to see you there!

## 2023-07-28 DIAGNOSIS — M8008XD Age-related osteoporosis with current pathological fracture, vertebra(e), subsequent encounter for fracture with routine healing: Secondary | ICD-10-CM | POA: Diagnosis not present

## 2023-07-28 DIAGNOSIS — G20A1 Parkinson's disease without dyskinesia, without mention of fluctuations: Secondary | ICD-10-CM | POA: Diagnosis not present

## 2023-07-28 DIAGNOSIS — F32A Depression, unspecified: Secondary | ICD-10-CM | POA: Diagnosis not present

## 2023-07-28 DIAGNOSIS — F411 Generalized anxiety disorder: Secondary | ICD-10-CM | POA: Diagnosis not present

## 2023-07-28 DIAGNOSIS — I10 Essential (primary) hypertension: Secondary | ICD-10-CM | POA: Diagnosis not present

## 2023-07-28 DIAGNOSIS — F330 Major depressive disorder, recurrent, mild: Secondary | ICD-10-CM | POA: Diagnosis not present

## 2023-07-28 DIAGNOSIS — F419 Anxiety disorder, unspecified: Secondary | ICD-10-CM | POA: Diagnosis not present

## 2023-07-28 DIAGNOSIS — M24542 Contracture, left hand: Secondary | ICD-10-CM | POA: Diagnosis not present

## 2023-07-28 DIAGNOSIS — F429 Obsessive-compulsive disorder, unspecified: Secondary | ICD-10-CM | POA: Diagnosis not present

## 2023-07-28 DIAGNOSIS — K5909 Other constipation: Secondary | ICD-10-CM | POA: Diagnosis not present

## 2023-07-28 DIAGNOSIS — M800AXD Age-related osteoporosis with current pathological fracture, other site, subsequent encounter for fracture with routine healing: Secondary | ICD-10-CM | POA: Diagnosis not present

## 2023-08-02 DIAGNOSIS — R627 Adult failure to thrive: Secondary | ICD-10-CM | POA: Diagnosis not present

## 2023-08-02 DIAGNOSIS — M800AXD Age-related osteoporosis with current pathological fracture, other site, subsequent encounter for fracture with routine healing: Secondary | ICD-10-CM | POA: Diagnosis not present

## 2023-08-02 DIAGNOSIS — I10 Essential (primary) hypertension: Secondary | ICD-10-CM | POA: Diagnosis not present

## 2023-08-02 DIAGNOSIS — G20A1 Parkinson's disease without dyskinesia, without mention of fluctuations: Secondary | ICD-10-CM | POA: Diagnosis not present

## 2023-08-02 DIAGNOSIS — F419 Anxiety disorder, unspecified: Secondary | ICD-10-CM | POA: Diagnosis not present

## 2023-08-02 DIAGNOSIS — Z9181 History of falling: Secondary | ICD-10-CM | POA: Diagnosis not present

## 2023-08-02 DIAGNOSIS — R443 Hallucinations, unspecified: Secondary | ICD-10-CM | POA: Diagnosis not present

## 2023-08-02 DIAGNOSIS — F429 Obsessive-compulsive disorder, unspecified: Secondary | ICD-10-CM | POA: Diagnosis not present

## 2023-08-02 DIAGNOSIS — F32A Depression, unspecified: Secondary | ICD-10-CM | POA: Diagnosis not present

## 2023-08-02 DIAGNOSIS — M24542 Contracture, left hand: Secondary | ICD-10-CM | POA: Diagnosis not present

## 2023-08-02 DIAGNOSIS — M8008XD Age-related osteoporosis with current pathological fracture, vertebra(e), subsequent encounter for fracture with routine healing: Secondary | ICD-10-CM | POA: Diagnosis not present

## 2023-08-02 DIAGNOSIS — K5909 Other constipation: Secondary | ICD-10-CM | POA: Diagnosis not present

## 2023-08-03 DIAGNOSIS — K5909 Other constipation: Secondary | ICD-10-CM | POA: Diagnosis not present

## 2023-08-03 DIAGNOSIS — M800AXD Age-related osteoporosis with current pathological fracture, other site, subsequent encounter for fracture with routine healing: Secondary | ICD-10-CM | POA: Diagnosis not present

## 2023-08-03 DIAGNOSIS — G20A1 Parkinson's disease without dyskinesia, without mention of fluctuations: Secondary | ICD-10-CM | POA: Diagnosis not present

## 2023-08-03 DIAGNOSIS — F32A Depression, unspecified: Secondary | ICD-10-CM | POA: Diagnosis not present

## 2023-08-03 DIAGNOSIS — F429 Obsessive-compulsive disorder, unspecified: Secondary | ICD-10-CM | POA: Diagnosis not present

## 2023-08-03 DIAGNOSIS — I10 Essential (primary) hypertension: Secondary | ICD-10-CM | POA: Diagnosis not present

## 2023-08-03 DIAGNOSIS — F419 Anxiety disorder, unspecified: Secondary | ICD-10-CM | POA: Diagnosis not present

## 2023-08-03 DIAGNOSIS — M24542 Contracture, left hand: Secondary | ICD-10-CM | POA: Diagnosis not present

## 2023-08-03 DIAGNOSIS — M8008XD Age-related osteoporosis with current pathological fracture, vertebra(e), subsequent encounter for fracture with routine healing: Secondary | ICD-10-CM | POA: Diagnosis not present

## 2023-08-04 DIAGNOSIS — M800AXD Age-related osteoporosis with current pathological fracture, other site, subsequent encounter for fracture with routine healing: Secondary | ICD-10-CM | POA: Diagnosis not present

## 2023-08-04 DIAGNOSIS — F429 Obsessive-compulsive disorder, unspecified: Secondary | ICD-10-CM | POA: Diagnosis not present

## 2023-08-04 DIAGNOSIS — M8008XD Age-related osteoporosis with current pathological fracture, vertebra(e), subsequent encounter for fracture with routine healing: Secondary | ICD-10-CM | POA: Diagnosis not present

## 2023-08-04 DIAGNOSIS — I10 Essential (primary) hypertension: Secondary | ICD-10-CM | POA: Diagnosis not present

## 2023-08-04 DIAGNOSIS — F419 Anxiety disorder, unspecified: Secondary | ICD-10-CM | POA: Diagnosis not present

## 2023-08-04 DIAGNOSIS — K5909 Other constipation: Secondary | ICD-10-CM | POA: Diagnosis not present

## 2023-08-04 DIAGNOSIS — M24542 Contracture, left hand: Secondary | ICD-10-CM | POA: Diagnosis not present

## 2023-08-04 DIAGNOSIS — G20A1 Parkinson's disease without dyskinesia, without mention of fluctuations: Secondary | ICD-10-CM | POA: Diagnosis not present

## 2023-08-04 DIAGNOSIS — F32A Depression, unspecified: Secondary | ICD-10-CM | POA: Diagnosis not present

## 2023-08-05 DIAGNOSIS — R2689 Other abnormalities of gait and mobility: Secondary | ICD-10-CM | POA: Diagnosis not present

## 2023-08-08 DIAGNOSIS — M1712 Unilateral primary osteoarthritis, left knee: Secondary | ICD-10-CM | POA: Diagnosis not present

## 2023-08-08 DIAGNOSIS — R4182 Altered mental status, unspecified: Secondary | ICD-10-CM | POA: Diagnosis not present

## 2023-08-08 DIAGNOSIS — M19072 Primary osteoarthritis, left ankle and foot: Secondary | ICD-10-CM | POA: Diagnosis not present

## 2023-08-08 DIAGNOSIS — M85872 Other specified disorders of bone density and structure, left ankle and foot: Secondary | ICD-10-CM | POA: Diagnosis not present

## 2023-08-09 DIAGNOSIS — F5105 Insomnia due to other mental disorder: Secondary | ICD-10-CM | POA: Diagnosis not present

## 2023-08-09 DIAGNOSIS — G8929 Other chronic pain: Secondary | ICD-10-CM | POA: Diagnosis not present

## 2023-08-09 DIAGNOSIS — W010XXA Fall on same level from slipping, tripping and stumbling without subsequent striking against object, initial encounter: Secondary | ICD-10-CM | POA: Diagnosis not present

## 2023-08-09 DIAGNOSIS — M1991 Primary osteoarthritis, unspecified site: Secondary | ICD-10-CM | POA: Diagnosis not present

## 2023-08-09 DIAGNOSIS — G20A1 Parkinson's disease without dyskinesia, without mention of fluctuations: Secondary | ICD-10-CM | POA: Diagnosis not present

## 2023-08-09 DIAGNOSIS — F02B0 Dementia in other diseases classified elsewhere, moderate, without behavioral disturbance, psychotic disturbance, mood disturbance, and anxiety: Secondary | ICD-10-CM | POA: Diagnosis not present

## 2023-08-09 DIAGNOSIS — F331 Major depressive disorder, recurrent, moderate: Secondary | ICD-10-CM | POA: Diagnosis not present

## 2023-08-09 DIAGNOSIS — N390 Urinary tract infection, site not specified: Secondary | ICD-10-CM | POA: Diagnosis not present

## 2023-08-12 DIAGNOSIS — F330 Major depressive disorder, recurrent, mild: Secondary | ICD-10-CM | POA: Diagnosis not present

## 2023-08-12 DIAGNOSIS — F411 Generalized anxiety disorder: Secondary | ICD-10-CM | POA: Diagnosis not present

## 2023-08-12 DIAGNOSIS — M1991 Primary osteoarthritis, unspecified site: Secondary | ICD-10-CM | POA: Diagnosis not present

## 2023-08-12 DIAGNOSIS — N182 Chronic kidney disease, stage 2 (mild): Secondary | ICD-10-CM | POA: Diagnosis not present

## 2023-08-13 DIAGNOSIS — M8008XD Age-related osteoporosis with current pathological fracture, vertebra(e), subsequent encounter for fracture with routine healing: Secondary | ICD-10-CM | POA: Diagnosis not present

## 2023-08-13 DIAGNOSIS — M800AXD Age-related osteoporosis with current pathological fracture, other site, subsequent encounter for fracture with routine healing: Secondary | ICD-10-CM | POA: Diagnosis not present

## 2023-08-13 DIAGNOSIS — M24542 Contracture, left hand: Secondary | ICD-10-CM | POA: Diagnosis not present

## 2023-08-13 DIAGNOSIS — I10 Essential (primary) hypertension: Secondary | ICD-10-CM | POA: Diagnosis not present

## 2023-08-13 DIAGNOSIS — K5909 Other constipation: Secondary | ICD-10-CM | POA: Diagnosis not present

## 2023-08-13 DIAGNOSIS — F32A Depression, unspecified: Secondary | ICD-10-CM | POA: Diagnosis not present

## 2023-08-13 DIAGNOSIS — F419 Anxiety disorder, unspecified: Secondary | ICD-10-CM | POA: Diagnosis not present

## 2023-08-13 DIAGNOSIS — G20A1 Parkinson's disease without dyskinesia, without mention of fluctuations: Secondary | ICD-10-CM | POA: Diagnosis not present

## 2023-08-13 DIAGNOSIS — F429 Obsessive-compulsive disorder, unspecified: Secondary | ICD-10-CM | POA: Diagnosis not present

## 2023-08-15 DIAGNOSIS — R413 Other amnesia: Secondary | ICD-10-CM | POA: Diagnosis not present

## 2023-08-15 DIAGNOSIS — R627 Adult failure to thrive: Secondary | ICD-10-CM | POA: Diagnosis not present

## 2023-08-15 DIAGNOSIS — I10 Essential (primary) hypertension: Secondary | ICD-10-CM | POA: Diagnosis not present

## 2023-08-15 DIAGNOSIS — G20A1 Parkinson's disease without dyskinesia, without mention of fluctuations: Secondary | ICD-10-CM | POA: Diagnosis not present

## 2023-08-15 DIAGNOSIS — M810 Age-related osteoporosis without current pathological fracture: Secondary | ICD-10-CM | POA: Diagnosis not present

## 2023-08-15 DIAGNOSIS — F419 Anxiety disorder, unspecified: Secondary | ICD-10-CM | POA: Diagnosis not present

## 2023-08-15 DIAGNOSIS — E46 Unspecified protein-calorie malnutrition: Secondary | ICD-10-CM | POA: Diagnosis not present

## 2023-08-15 DIAGNOSIS — R296 Repeated falls: Secondary | ICD-10-CM | POA: Diagnosis not present

## 2023-08-16 DIAGNOSIS — F429 Obsessive-compulsive disorder, unspecified: Secondary | ICD-10-CM | POA: Diagnosis not present

## 2023-08-16 DIAGNOSIS — I10 Essential (primary) hypertension: Secondary | ICD-10-CM | POA: Diagnosis not present

## 2023-08-16 DIAGNOSIS — F419 Anxiety disorder, unspecified: Secondary | ICD-10-CM | POA: Diagnosis not present

## 2023-08-16 DIAGNOSIS — M24542 Contracture, left hand: Secondary | ICD-10-CM | POA: Diagnosis not present

## 2023-08-16 DIAGNOSIS — F32A Depression, unspecified: Secondary | ICD-10-CM | POA: Diagnosis not present

## 2023-08-16 DIAGNOSIS — M8008XD Age-related osteoporosis with current pathological fracture, vertebra(e), subsequent encounter for fracture with routine healing: Secondary | ICD-10-CM | POA: Diagnosis not present

## 2023-08-16 DIAGNOSIS — G20A1 Parkinson's disease without dyskinesia, without mention of fluctuations: Secondary | ICD-10-CM | POA: Diagnosis not present

## 2023-08-16 DIAGNOSIS — M800AXD Age-related osteoporosis with current pathological fracture, other site, subsequent encounter for fracture with routine healing: Secondary | ICD-10-CM | POA: Diagnosis not present

## 2023-08-16 DIAGNOSIS — K5909 Other constipation: Secondary | ICD-10-CM | POA: Diagnosis not present

## 2023-08-23 DIAGNOSIS — I10 Essential (primary) hypertension: Secondary | ICD-10-CM | POA: Diagnosis not present

## 2023-08-23 DIAGNOSIS — K5909 Other constipation: Secondary | ICD-10-CM | POA: Diagnosis not present

## 2023-08-23 DIAGNOSIS — M810 Age-related osteoporosis without current pathological fracture: Secondary | ICD-10-CM | POA: Diagnosis not present

## 2023-08-23 DIAGNOSIS — M8008XD Age-related osteoporosis with current pathological fracture, vertebra(e), subsequent encounter for fracture with routine healing: Secondary | ICD-10-CM | POA: Diagnosis not present

## 2023-08-23 DIAGNOSIS — M24542 Contracture, left hand: Secondary | ICD-10-CM | POA: Diagnosis not present

## 2023-08-23 DIAGNOSIS — G20A1 Parkinson's disease without dyskinesia, without mention of fluctuations: Secondary | ICD-10-CM | POA: Diagnosis not present

## 2023-08-23 DIAGNOSIS — F419 Anxiety disorder, unspecified: Secondary | ICD-10-CM | POA: Diagnosis not present

## 2023-08-23 DIAGNOSIS — F429 Obsessive-compulsive disorder, unspecified: Secondary | ICD-10-CM | POA: Diagnosis not present

## 2023-08-23 DIAGNOSIS — F32A Depression, unspecified: Secondary | ICD-10-CM | POA: Diagnosis not present

## 2023-08-23 DIAGNOSIS — M800AXD Age-related osteoporosis with current pathological fracture, other site, subsequent encounter for fracture with routine healing: Secondary | ICD-10-CM | POA: Diagnosis not present

## 2023-08-24 DIAGNOSIS — F429 Obsessive-compulsive disorder, unspecified: Secondary | ICD-10-CM | POA: Diagnosis not present

## 2023-08-24 DIAGNOSIS — G20A1 Parkinson's disease without dyskinesia, without mention of fluctuations: Secondary | ICD-10-CM | POA: Diagnosis not present

## 2023-08-24 DIAGNOSIS — K5909 Other constipation: Secondary | ICD-10-CM | POA: Diagnosis not present

## 2023-08-24 DIAGNOSIS — I10 Essential (primary) hypertension: Secondary | ICD-10-CM | POA: Diagnosis not present

## 2023-08-24 DIAGNOSIS — M800AXD Age-related osteoporosis with current pathological fracture, other site, subsequent encounter for fracture with routine healing: Secondary | ICD-10-CM | POA: Diagnosis not present

## 2023-08-24 DIAGNOSIS — M8008XD Age-related osteoporosis with current pathological fracture, vertebra(e), subsequent encounter for fracture with routine healing: Secondary | ICD-10-CM | POA: Diagnosis not present

## 2023-08-24 DIAGNOSIS — F32A Depression, unspecified: Secondary | ICD-10-CM | POA: Diagnosis not present

## 2023-08-24 DIAGNOSIS — F419 Anxiety disorder, unspecified: Secondary | ICD-10-CM | POA: Diagnosis not present

## 2023-08-24 DIAGNOSIS — M24542 Contracture, left hand: Secondary | ICD-10-CM | POA: Diagnosis not present

## 2023-08-25 DIAGNOSIS — F419 Anxiety disorder, unspecified: Secondary | ICD-10-CM | POA: Diagnosis not present

## 2023-08-25 DIAGNOSIS — K5909 Other constipation: Secondary | ICD-10-CM | POA: Diagnosis not present

## 2023-08-25 DIAGNOSIS — F32A Depression, unspecified: Secondary | ICD-10-CM | POA: Diagnosis not present

## 2023-08-25 DIAGNOSIS — M800AXD Age-related osteoporosis with current pathological fracture, other site, subsequent encounter for fracture with routine healing: Secondary | ICD-10-CM | POA: Diagnosis not present

## 2023-08-25 DIAGNOSIS — I10 Essential (primary) hypertension: Secondary | ICD-10-CM | POA: Diagnosis not present

## 2023-08-25 DIAGNOSIS — M24542 Contracture, left hand: Secondary | ICD-10-CM | POA: Diagnosis not present

## 2023-08-25 DIAGNOSIS — F429 Obsessive-compulsive disorder, unspecified: Secondary | ICD-10-CM | POA: Diagnosis not present

## 2023-08-25 DIAGNOSIS — G20A1 Parkinson's disease without dyskinesia, without mention of fluctuations: Secondary | ICD-10-CM | POA: Diagnosis not present

## 2023-08-25 DIAGNOSIS — M8008XD Age-related osteoporosis with current pathological fracture, vertebra(e), subsequent encounter for fracture with routine healing: Secondary | ICD-10-CM | POA: Diagnosis not present

## 2023-08-26 ENCOUNTER — Ambulatory Visit: Payer: Self-pay

## 2023-08-26 DIAGNOSIS — I10 Essential (primary) hypertension: Secondary | ICD-10-CM | POA: Diagnosis not present

## 2023-08-26 DIAGNOSIS — K5909 Other constipation: Secondary | ICD-10-CM | POA: Diagnosis not present

## 2023-08-26 DIAGNOSIS — S32000D Wedge compression fracture of unspecified lumbar vertebra, subsequent encounter for fracture with routine healing: Secondary | ICD-10-CM | POA: Diagnosis not present

## 2023-08-26 DIAGNOSIS — F419 Anxiety disorder, unspecified: Secondary | ICD-10-CM | POA: Diagnosis not present

## 2023-08-26 DIAGNOSIS — G20A1 Parkinson's disease without dyskinesia, without mention of fluctuations: Secondary | ICD-10-CM | POA: Diagnosis not present

## 2023-08-26 DIAGNOSIS — M800AXD Age-related osteoporosis with current pathological fracture, other site, subsequent encounter for fracture with routine healing: Secondary | ICD-10-CM | POA: Diagnosis not present

## 2023-08-26 DIAGNOSIS — M8008XD Age-related osteoporosis with current pathological fracture, vertebra(e), subsequent encounter for fracture with routine healing: Secondary | ICD-10-CM | POA: Diagnosis not present

## 2023-08-26 DIAGNOSIS — F429 Obsessive-compulsive disorder, unspecified: Secondary | ICD-10-CM | POA: Diagnosis not present

## 2023-08-26 DIAGNOSIS — F32A Depression, unspecified: Secondary | ICD-10-CM | POA: Diagnosis not present

## 2023-08-26 DIAGNOSIS — M24542 Contracture, left hand: Secondary | ICD-10-CM | POA: Diagnosis not present

## 2023-08-26 NOTE — Telephone Encounter (Signed)
 FYI Only or Action Required?: FYI only for provider.  Patient was last seen in primary care on unsure.  Called Nurse Triage reporting Anxiety.  Symptoms began ongoing today much worse.  Interventions attempted: Other: Called here.  Symptoms are: Unsure if worsening.  Triage Disposition: See Physician Within 24 Hours  Patient/caregiver understands and will follow disposition?: Unsure.  Triage Disposition: See Physician Within 24 Hours  Patient/caregiver understands and will follow disposition?: Unsure - Pt states that she is afraid that her prayers are not being heard, and she is afraid that she is not saved. She is concerned that satan is blocking her prayers. Pt is poor historian. She is living in assisted living, but does not  know if there is a chaplain available or if there are nurses available to help. Called pt's daughter - left message asking for a return call. Pt states that she is feeling better. Conversation ended.                    Copied from CRM [id number]. Topic: Clinical - Red Word Triage >> Aug 26, 2023  8:53 AM Gustabo D wrote: Patient feels afraid this morning and doesn't feel as hopeful as she has been. She's talking about God and satan. Reason for Disposition  Patient sounds very upset or troubled to the triager  Answer Assessment - Initial Assessment Questions 1. CONCERN: Did anything happen that prompted you to call today?      Unsure  - may have had a bad dream - can't pray - unsure if she is saved 2. ANXIETY SYMPTOMS: Can you describe how you (your loved one; patient) have been feeling? (e.g., tense, restless, panicky, anxious, keyed up, overwhelmed, sense of impending doom).      Feels like satan is preventing her from praying 3. ONSET: How long have you been feeling this way? (e.g., hours, days, weeks)     Today - seems to be onogin 4. SEVERITY: How would you rate the level of anxiety? (e.g., 0 - 10; or mild, moderate, severe).      moderate 5. FUNCTIONAL IMPAIRMENT: How have these feelings affected your ability to do daily activities? Have you had more difficulty than usual doing your normal daily activities? (e.g., getting better, same, worse; self-care, school, work, interactions)     unsure 6. HISTORY: Have you felt this way before? Have you ever been diagnosed with an anxiety problem in the past? (e.g., generalized anxiety disorder, panic attacks, PTSD). If Yes, ask: How was this problem treated? (e.g., medicines, counseling, etc.)     yes 7. RISK OF HARM - SUICIDAL IDEATION: Do you ever have thoughts of hurting or killing yourself? If Yes, ask:  Do you have these feelings now? Do you have a plan on how you would do this?     no 8. TREATMENT:  What has been done so far to treat this anxiety? (e.g., medicines, relaxation strategies). What has helped?     therapist 9. THERAPIST: Do you have a counselor or therapist? If Yes, ask: What is their name?     yes  11. PATIENT SUPPORT: Who is with you now? Who do you live with? Do you have family or friends who you can talk to?        At assisted facility 12. OTHER SYMPTOMS: Do you have any other symptoms? (e.g., feeling depressed, trouble concentrating, trouble sleeping, trouble breathing, palpitations or fast heartbeat, chest pain, sweating, nausea, or diarrhea)  Protocols used: Anxiety and Panic  Attack-A-AH

## 2023-08-30 DIAGNOSIS — F429 Obsessive-compulsive disorder, unspecified: Secondary | ICD-10-CM | POA: Diagnosis not present

## 2023-08-30 DIAGNOSIS — M800AXD Age-related osteoporosis with current pathological fracture, other site, subsequent encounter for fracture with routine healing: Secondary | ICD-10-CM | POA: Diagnosis not present

## 2023-08-30 DIAGNOSIS — M24542 Contracture, left hand: Secondary | ICD-10-CM | POA: Diagnosis not present

## 2023-08-30 DIAGNOSIS — F32A Depression, unspecified: Secondary | ICD-10-CM | POA: Diagnosis not present

## 2023-08-30 DIAGNOSIS — F419 Anxiety disorder, unspecified: Secondary | ICD-10-CM | POA: Diagnosis not present

## 2023-08-30 DIAGNOSIS — G20A1 Parkinson's disease without dyskinesia, without mention of fluctuations: Secondary | ICD-10-CM | POA: Diagnosis not present

## 2023-08-30 DIAGNOSIS — K5909 Other constipation: Secondary | ICD-10-CM | POA: Diagnosis not present

## 2023-08-30 DIAGNOSIS — M8008XD Age-related osteoporosis with current pathological fracture, vertebra(e), subsequent encounter for fracture with routine healing: Secondary | ICD-10-CM | POA: Diagnosis not present

## 2023-08-30 DIAGNOSIS — I10 Essential (primary) hypertension: Secondary | ICD-10-CM | POA: Diagnosis not present

## 2023-08-31 DIAGNOSIS — I10 Essential (primary) hypertension: Secondary | ICD-10-CM | POA: Diagnosis not present

## 2023-08-31 DIAGNOSIS — G20A1 Parkinson's disease without dyskinesia, without mention of fluctuations: Secondary | ICD-10-CM | POA: Diagnosis not present

## 2023-08-31 DIAGNOSIS — K5909 Other constipation: Secondary | ICD-10-CM | POA: Diagnosis not present

## 2023-08-31 DIAGNOSIS — M24542 Contracture, left hand: Secondary | ICD-10-CM | POA: Diagnosis not present

## 2023-08-31 DIAGNOSIS — F419 Anxiety disorder, unspecified: Secondary | ICD-10-CM | POA: Diagnosis not present

## 2023-08-31 DIAGNOSIS — M8008XD Age-related osteoporosis with current pathological fracture, vertebra(e), subsequent encounter for fracture with routine healing: Secondary | ICD-10-CM | POA: Diagnosis not present

## 2023-08-31 DIAGNOSIS — F429 Obsessive-compulsive disorder, unspecified: Secondary | ICD-10-CM | POA: Diagnosis not present

## 2023-08-31 DIAGNOSIS — F32A Depression, unspecified: Secondary | ICD-10-CM | POA: Diagnosis not present

## 2023-08-31 DIAGNOSIS — M800AXD Age-related osteoporosis with current pathological fracture, other site, subsequent encounter for fracture with routine healing: Secondary | ICD-10-CM | POA: Diagnosis not present

## 2023-09-01 DIAGNOSIS — G20A1 Parkinson's disease without dyskinesia, without mention of fluctuations: Secondary | ICD-10-CM | POA: Diagnosis not present

## 2023-09-01 DIAGNOSIS — Z9181 History of falling: Secondary | ICD-10-CM | POA: Diagnosis not present

## 2023-09-01 DIAGNOSIS — R627 Adult failure to thrive: Secondary | ICD-10-CM | POA: Diagnosis not present

## 2023-09-01 DIAGNOSIS — R443 Hallucinations, unspecified: Secondary | ICD-10-CM | POA: Diagnosis not present

## 2023-09-02 DIAGNOSIS — F419 Anxiety disorder, unspecified: Secondary | ICD-10-CM | POA: Diagnosis not present

## 2023-09-02 DIAGNOSIS — K5909 Other constipation: Secondary | ICD-10-CM | POA: Diagnosis not present

## 2023-09-02 DIAGNOSIS — M24542 Contracture, left hand: Secondary | ICD-10-CM | POA: Diagnosis not present

## 2023-09-02 DIAGNOSIS — R011 Cardiac murmur, unspecified: Secondary | ICD-10-CM | POA: Diagnosis not present

## 2023-09-02 DIAGNOSIS — M800AXD Age-related osteoporosis with current pathological fracture, other site, subsequent encounter for fracture with routine healing: Secondary | ICD-10-CM | POA: Diagnosis not present

## 2023-09-02 DIAGNOSIS — F32A Depression, unspecified: Secondary | ICD-10-CM | POA: Diagnosis not present

## 2023-09-02 DIAGNOSIS — G20A1 Parkinson's disease without dyskinesia, without mention of fluctuations: Secondary | ICD-10-CM | POA: Diagnosis not present

## 2023-09-02 DIAGNOSIS — R012 Other cardiac sounds: Secondary | ICD-10-CM | POA: Diagnosis not present

## 2023-09-02 DIAGNOSIS — F429 Obsessive-compulsive disorder, unspecified: Secondary | ICD-10-CM | POA: Diagnosis not present

## 2023-09-02 DIAGNOSIS — I10 Essential (primary) hypertension: Secondary | ICD-10-CM | POA: Diagnosis not present

## 2023-09-02 DIAGNOSIS — M8008XD Age-related osteoporosis with current pathological fracture, vertebra(e), subsequent encounter for fracture with routine healing: Secondary | ICD-10-CM | POA: Diagnosis not present

## 2023-09-04 DIAGNOSIS — R2689 Other abnormalities of gait and mobility: Secondary | ICD-10-CM | POA: Diagnosis not present

## 2023-09-05 DIAGNOSIS — F419 Anxiety disorder, unspecified: Secondary | ICD-10-CM | POA: Diagnosis not present

## 2023-09-05 DIAGNOSIS — G20A1 Parkinson's disease without dyskinesia, without mention of fluctuations: Secondary | ICD-10-CM | POA: Diagnosis not present

## 2023-09-05 DIAGNOSIS — R627 Adult failure to thrive: Secondary | ICD-10-CM | POA: Diagnosis not present

## 2023-09-05 DIAGNOSIS — M8008XD Age-related osteoporosis with current pathological fracture, vertebra(e), subsequent encounter for fracture with routine healing: Secondary | ICD-10-CM | POA: Diagnosis not present

## 2023-09-05 DIAGNOSIS — M800AXD Age-related osteoporosis with current pathological fracture, other site, subsequent encounter for fracture with routine healing: Secondary | ICD-10-CM | POA: Diagnosis not present

## 2023-09-05 DIAGNOSIS — R413 Other amnesia: Secondary | ICD-10-CM | POA: Diagnosis not present

## 2023-09-05 DIAGNOSIS — F429 Obsessive-compulsive disorder, unspecified: Secondary | ICD-10-CM | POA: Diagnosis not present

## 2023-09-05 DIAGNOSIS — K5909 Other constipation: Secondary | ICD-10-CM | POA: Diagnosis not present

## 2023-09-05 DIAGNOSIS — R296 Repeated falls: Secondary | ICD-10-CM | POA: Diagnosis not present

## 2023-09-05 DIAGNOSIS — I10 Essential (primary) hypertension: Secondary | ICD-10-CM | POA: Diagnosis not present

## 2023-09-05 DIAGNOSIS — E46 Unspecified protein-calorie malnutrition: Secondary | ICD-10-CM | POA: Diagnosis not present

## 2023-09-05 DIAGNOSIS — M810 Age-related osteoporosis without current pathological fracture: Secondary | ICD-10-CM | POA: Diagnosis not present

## 2023-09-05 DIAGNOSIS — M24542 Contracture, left hand: Secondary | ICD-10-CM | POA: Diagnosis not present

## 2023-09-05 DIAGNOSIS — F32A Depression, unspecified: Secondary | ICD-10-CM | POA: Diagnosis not present

## 2023-09-07 DIAGNOSIS — F429 Obsessive-compulsive disorder, unspecified: Secondary | ICD-10-CM | POA: Diagnosis not present

## 2023-09-07 DIAGNOSIS — M800AXD Age-related osteoporosis with current pathological fracture, other site, subsequent encounter for fracture with routine healing: Secondary | ICD-10-CM | POA: Diagnosis not present

## 2023-09-07 DIAGNOSIS — M8008XD Age-related osteoporosis with current pathological fracture, vertebra(e), subsequent encounter for fracture with routine healing: Secondary | ICD-10-CM | POA: Diagnosis not present

## 2023-09-07 DIAGNOSIS — F32A Depression, unspecified: Secondary | ICD-10-CM | POA: Diagnosis not present

## 2023-09-07 DIAGNOSIS — K5909 Other constipation: Secondary | ICD-10-CM | POA: Diagnosis not present

## 2023-09-07 DIAGNOSIS — I10 Essential (primary) hypertension: Secondary | ICD-10-CM | POA: Diagnosis not present

## 2023-09-07 DIAGNOSIS — M24542 Contracture, left hand: Secondary | ICD-10-CM | POA: Diagnosis not present

## 2023-09-07 DIAGNOSIS — F419 Anxiety disorder, unspecified: Secondary | ICD-10-CM | POA: Diagnosis not present

## 2023-09-07 DIAGNOSIS — G20A1 Parkinson's disease without dyskinesia, without mention of fluctuations: Secondary | ICD-10-CM | POA: Diagnosis not present

## 2023-09-08 DIAGNOSIS — F429 Obsessive-compulsive disorder, unspecified: Secondary | ICD-10-CM | POA: Diagnosis not present

## 2023-09-08 DIAGNOSIS — I10 Essential (primary) hypertension: Secondary | ICD-10-CM | POA: Diagnosis not present

## 2023-09-08 DIAGNOSIS — M24542 Contracture, left hand: Secondary | ICD-10-CM | POA: Diagnosis not present

## 2023-09-08 DIAGNOSIS — M800AXD Age-related osteoporosis with current pathological fracture, other site, subsequent encounter for fracture with routine healing: Secondary | ICD-10-CM | POA: Diagnosis not present

## 2023-09-08 DIAGNOSIS — K5909 Other constipation: Secondary | ICD-10-CM | POA: Diagnosis not present

## 2023-09-08 DIAGNOSIS — F419 Anxiety disorder, unspecified: Secondary | ICD-10-CM | POA: Diagnosis not present

## 2023-09-08 DIAGNOSIS — M8008XD Age-related osteoporosis with current pathological fracture, vertebra(e), subsequent encounter for fracture with routine healing: Secondary | ICD-10-CM | POA: Diagnosis not present

## 2023-09-08 DIAGNOSIS — F32A Depression, unspecified: Secondary | ICD-10-CM | POA: Diagnosis not present

## 2023-09-08 DIAGNOSIS — G20A1 Parkinson's disease without dyskinesia, without mention of fluctuations: Secondary | ICD-10-CM | POA: Diagnosis not present

## 2023-09-09 DIAGNOSIS — M800AXD Age-related osteoporosis with current pathological fracture, other site, subsequent encounter for fracture with routine healing: Secondary | ICD-10-CM | POA: Diagnosis not present

## 2023-09-09 DIAGNOSIS — M24542 Contracture, left hand: Secondary | ICD-10-CM | POA: Diagnosis not present

## 2023-09-09 DIAGNOSIS — F419 Anxiety disorder, unspecified: Secondary | ICD-10-CM | POA: Diagnosis not present

## 2023-09-09 DIAGNOSIS — F429 Obsessive-compulsive disorder, unspecified: Secondary | ICD-10-CM | POA: Diagnosis not present

## 2023-09-09 DIAGNOSIS — F32A Depression, unspecified: Secondary | ICD-10-CM | POA: Diagnosis not present

## 2023-09-09 DIAGNOSIS — M8008XD Age-related osteoporosis with current pathological fracture, vertebra(e), subsequent encounter for fracture with routine healing: Secondary | ICD-10-CM | POA: Diagnosis not present

## 2023-09-09 DIAGNOSIS — G20A1 Parkinson's disease without dyskinesia, without mention of fluctuations: Secondary | ICD-10-CM | POA: Diagnosis not present

## 2023-09-09 DIAGNOSIS — K5909 Other constipation: Secondary | ICD-10-CM | POA: Diagnosis not present

## 2023-09-09 DIAGNOSIS — I10 Essential (primary) hypertension: Secondary | ICD-10-CM | POA: Diagnosis not present

## 2023-09-12 DIAGNOSIS — M24542 Contracture, left hand: Secondary | ICD-10-CM | POA: Diagnosis not present

## 2023-09-12 DIAGNOSIS — F32A Depression, unspecified: Secondary | ICD-10-CM | POA: Diagnosis not present

## 2023-09-12 DIAGNOSIS — I10 Essential (primary) hypertension: Secondary | ICD-10-CM | POA: Diagnosis not present

## 2023-09-12 DIAGNOSIS — K5909 Other constipation: Secondary | ICD-10-CM | POA: Diagnosis not present

## 2023-09-12 DIAGNOSIS — F419 Anxiety disorder, unspecified: Secondary | ICD-10-CM | POA: Diagnosis not present

## 2023-09-12 DIAGNOSIS — M800AXD Age-related osteoporosis with current pathological fracture, other site, subsequent encounter for fracture with routine healing: Secondary | ICD-10-CM | POA: Diagnosis not present

## 2023-09-12 DIAGNOSIS — M8008XD Age-related osteoporosis with current pathological fracture, vertebra(e), subsequent encounter for fracture with routine healing: Secondary | ICD-10-CM | POA: Diagnosis not present

## 2023-09-12 DIAGNOSIS — F429 Obsessive-compulsive disorder, unspecified: Secondary | ICD-10-CM | POA: Diagnosis not present

## 2023-09-12 DIAGNOSIS — G20A1 Parkinson's disease without dyskinesia, without mention of fluctuations: Secondary | ICD-10-CM | POA: Diagnosis not present

## 2023-09-13 DIAGNOSIS — M24542 Contracture, left hand: Secondary | ICD-10-CM | POA: Diagnosis not present

## 2023-09-13 DIAGNOSIS — F419 Anxiety disorder, unspecified: Secondary | ICD-10-CM | POA: Diagnosis not present

## 2023-09-13 DIAGNOSIS — G20A1 Parkinson's disease without dyskinesia, without mention of fluctuations: Secondary | ICD-10-CM | POA: Diagnosis not present

## 2023-09-13 DIAGNOSIS — K5909 Other constipation: Secondary | ICD-10-CM | POA: Diagnosis not present

## 2023-09-13 DIAGNOSIS — M8008XD Age-related osteoporosis with current pathological fracture, vertebra(e), subsequent encounter for fracture with routine healing: Secondary | ICD-10-CM | POA: Diagnosis not present

## 2023-09-13 DIAGNOSIS — F429 Obsessive-compulsive disorder, unspecified: Secondary | ICD-10-CM | POA: Diagnosis not present

## 2023-09-13 DIAGNOSIS — I10 Essential (primary) hypertension: Secondary | ICD-10-CM | POA: Diagnosis not present

## 2023-09-13 DIAGNOSIS — M800AXD Age-related osteoporosis with current pathological fracture, other site, subsequent encounter for fracture with routine healing: Secondary | ICD-10-CM | POA: Diagnosis not present

## 2023-09-13 DIAGNOSIS — F32A Depression, unspecified: Secondary | ICD-10-CM | POA: Diagnosis not present

## 2023-09-15 DIAGNOSIS — F419 Anxiety disorder, unspecified: Secondary | ICD-10-CM | POA: Diagnosis not present

## 2023-09-15 DIAGNOSIS — M24542 Contracture, left hand: Secondary | ICD-10-CM | POA: Diagnosis not present

## 2023-09-15 DIAGNOSIS — M8008XD Age-related osteoporosis with current pathological fracture, vertebra(e), subsequent encounter for fracture with routine healing: Secondary | ICD-10-CM | POA: Diagnosis not present

## 2023-09-15 DIAGNOSIS — K5909 Other constipation: Secondary | ICD-10-CM | POA: Diagnosis not present

## 2023-09-15 DIAGNOSIS — G20A1 Parkinson's disease without dyskinesia, without mention of fluctuations: Secondary | ICD-10-CM | POA: Diagnosis not present

## 2023-09-15 DIAGNOSIS — F429 Obsessive-compulsive disorder, unspecified: Secondary | ICD-10-CM | POA: Diagnosis not present

## 2023-09-15 DIAGNOSIS — M800AXD Age-related osteoporosis with current pathological fracture, other site, subsequent encounter for fracture with routine healing: Secondary | ICD-10-CM | POA: Diagnosis not present

## 2023-09-15 DIAGNOSIS — F32A Depression, unspecified: Secondary | ICD-10-CM | POA: Diagnosis not present

## 2023-09-15 DIAGNOSIS — I10 Essential (primary) hypertension: Secondary | ICD-10-CM | POA: Diagnosis not present

## 2023-09-16 DIAGNOSIS — M800AXD Age-related osteoporosis with current pathological fracture, other site, subsequent encounter for fracture with routine healing: Secondary | ICD-10-CM | POA: Diagnosis not present

## 2023-09-16 DIAGNOSIS — M8008XD Age-related osteoporosis with current pathological fracture, vertebra(e), subsequent encounter for fracture with routine healing: Secondary | ICD-10-CM | POA: Diagnosis not present

## 2023-09-16 DIAGNOSIS — F32A Depression, unspecified: Secondary | ICD-10-CM | POA: Diagnosis not present

## 2023-09-16 DIAGNOSIS — F419 Anxiety disorder, unspecified: Secondary | ICD-10-CM | POA: Diagnosis not present

## 2023-09-16 DIAGNOSIS — F429 Obsessive-compulsive disorder, unspecified: Secondary | ICD-10-CM | POA: Diagnosis not present

## 2023-09-16 DIAGNOSIS — I10 Essential (primary) hypertension: Secondary | ICD-10-CM | POA: Diagnosis not present

## 2023-09-16 DIAGNOSIS — G20A1 Parkinson's disease without dyskinesia, without mention of fluctuations: Secondary | ICD-10-CM | POA: Diagnosis not present

## 2023-09-16 DIAGNOSIS — M24542 Contracture, left hand: Secondary | ICD-10-CM | POA: Diagnosis not present

## 2023-09-16 DIAGNOSIS — K5909 Other constipation: Secondary | ICD-10-CM | POA: Diagnosis not present

## 2023-09-19 DIAGNOSIS — R0989 Other specified symptoms and signs involving the circulatory and respiratory systems: Secondary | ICD-10-CM | POA: Diagnosis not present

## 2023-09-20 DIAGNOSIS — F32A Depression, unspecified: Secondary | ICD-10-CM | POA: Diagnosis not present

## 2023-09-20 DIAGNOSIS — K5909 Other constipation: Secondary | ICD-10-CM | POA: Diagnosis not present

## 2023-09-20 DIAGNOSIS — I10 Essential (primary) hypertension: Secondary | ICD-10-CM | POA: Diagnosis not present

## 2023-09-20 DIAGNOSIS — M8008XD Age-related osteoporosis with current pathological fracture, vertebra(e), subsequent encounter for fracture with routine healing: Secondary | ICD-10-CM | POA: Diagnosis not present

## 2023-09-20 DIAGNOSIS — F429 Obsessive-compulsive disorder, unspecified: Secondary | ICD-10-CM | POA: Diagnosis not present

## 2023-09-20 DIAGNOSIS — M24542 Contracture, left hand: Secondary | ICD-10-CM | POA: Diagnosis not present

## 2023-09-20 DIAGNOSIS — M800AXD Age-related osteoporosis with current pathological fracture, other site, subsequent encounter for fracture with routine healing: Secondary | ICD-10-CM | POA: Diagnosis not present

## 2023-09-20 DIAGNOSIS — F419 Anxiety disorder, unspecified: Secondary | ICD-10-CM | POA: Diagnosis not present

## 2023-09-20 DIAGNOSIS — E46 Unspecified protein-calorie malnutrition: Secondary | ICD-10-CM | POA: Diagnosis not present

## 2023-09-20 DIAGNOSIS — M810 Age-related osteoporosis without current pathological fracture: Secondary | ICD-10-CM | POA: Diagnosis not present

## 2023-09-20 DIAGNOSIS — G20A1 Parkinson's disease without dyskinesia, without mention of fluctuations: Secondary | ICD-10-CM | POA: Diagnosis not present

## 2023-09-21 DIAGNOSIS — M24542 Contracture, left hand: Secondary | ICD-10-CM | POA: Diagnosis not present

## 2023-09-21 DIAGNOSIS — F32A Depression, unspecified: Secondary | ICD-10-CM | POA: Diagnosis not present

## 2023-09-21 DIAGNOSIS — M800AXD Age-related osteoporosis with current pathological fracture, other site, subsequent encounter for fracture with routine healing: Secondary | ICD-10-CM | POA: Diagnosis not present

## 2023-09-21 DIAGNOSIS — G20A1 Parkinson's disease without dyskinesia, without mention of fluctuations: Secondary | ICD-10-CM | POA: Diagnosis not present

## 2023-09-21 DIAGNOSIS — K5909 Other constipation: Secondary | ICD-10-CM | POA: Diagnosis not present

## 2023-09-21 DIAGNOSIS — M8008XD Age-related osteoporosis with current pathological fracture, vertebra(e), subsequent encounter for fracture with routine healing: Secondary | ICD-10-CM | POA: Diagnosis not present

## 2023-09-21 DIAGNOSIS — I10 Essential (primary) hypertension: Secondary | ICD-10-CM | POA: Diagnosis not present

## 2023-09-21 DIAGNOSIS — F429 Obsessive-compulsive disorder, unspecified: Secondary | ICD-10-CM | POA: Diagnosis not present

## 2023-09-21 DIAGNOSIS — F419 Anxiety disorder, unspecified: Secondary | ICD-10-CM | POA: Diagnosis not present

## 2023-09-22 DIAGNOSIS — M810 Age-related osteoporosis without current pathological fracture: Secondary | ICD-10-CM | POA: Diagnosis not present

## 2023-09-22 DIAGNOSIS — I10 Essential (primary) hypertension: Secondary | ICD-10-CM | POA: Diagnosis not present

## 2023-09-23 DIAGNOSIS — K5909 Other constipation: Secondary | ICD-10-CM | POA: Diagnosis not present

## 2023-09-23 DIAGNOSIS — M8008XD Age-related osteoporosis with current pathological fracture, vertebra(e), subsequent encounter for fracture with routine healing: Secondary | ICD-10-CM | POA: Diagnosis not present

## 2023-09-23 DIAGNOSIS — F32A Depression, unspecified: Secondary | ICD-10-CM | POA: Diagnosis not present

## 2023-09-23 DIAGNOSIS — F429 Obsessive-compulsive disorder, unspecified: Secondary | ICD-10-CM | POA: Diagnosis not present

## 2023-09-23 DIAGNOSIS — F419 Anxiety disorder, unspecified: Secondary | ICD-10-CM | POA: Diagnosis not present

## 2023-09-23 DIAGNOSIS — M800AXD Age-related osteoporosis with current pathological fracture, other site, subsequent encounter for fracture with routine healing: Secondary | ICD-10-CM | POA: Diagnosis not present

## 2023-09-23 DIAGNOSIS — M24542 Contracture, left hand: Secondary | ICD-10-CM | POA: Diagnosis not present

## 2023-09-23 DIAGNOSIS — G20A1 Parkinson's disease without dyskinesia, without mention of fluctuations: Secondary | ICD-10-CM | POA: Diagnosis not present

## 2023-09-23 DIAGNOSIS — I10 Essential (primary) hypertension: Secondary | ICD-10-CM | POA: Diagnosis not present

## 2023-09-25 DIAGNOSIS — G20A2 Parkinson's disease without dyskinesia, with fluctuations: Secondary | ICD-10-CM | POA: Diagnosis not present

## 2023-09-25 DIAGNOSIS — I6523 Occlusion and stenosis of bilateral carotid arteries: Secondary | ICD-10-CM | POA: Diagnosis not present

## 2023-09-25 DIAGNOSIS — R410 Disorientation, unspecified: Secondary | ICD-10-CM | POA: Diagnosis not present

## 2023-09-25 DIAGNOSIS — R4182 Altered mental status, unspecified: Secondary | ICD-10-CM | POA: Diagnosis not present

## 2023-09-25 DIAGNOSIS — F0280 Dementia in other diseases classified elsewhere without behavioral disturbance: Secondary | ICD-10-CM | POA: Diagnosis not present

## 2023-09-25 DIAGNOSIS — Z743 Need for continuous supervision: Secondary | ICD-10-CM | POA: Diagnosis not present

## 2023-09-25 DIAGNOSIS — G20C Parkinsonism, unspecified: Secondary | ICD-10-CM | POA: Diagnosis not present

## 2023-09-26 DIAGNOSIS — S32000D Wedge compression fracture of unspecified lumbar vertebra, subsequent encounter for fracture with routine healing: Secondary | ICD-10-CM | POA: Diagnosis not present

## 2023-09-27 DIAGNOSIS — F32A Depression, unspecified: Secondary | ICD-10-CM | POA: Diagnosis not present

## 2023-09-27 DIAGNOSIS — F429 Obsessive-compulsive disorder, unspecified: Secondary | ICD-10-CM | POA: Diagnosis not present

## 2023-09-27 DIAGNOSIS — M8008XD Age-related osteoporosis with current pathological fracture, vertebra(e), subsequent encounter for fracture with routine healing: Secondary | ICD-10-CM | POA: Diagnosis not present

## 2023-09-27 DIAGNOSIS — I10 Essential (primary) hypertension: Secondary | ICD-10-CM | POA: Diagnosis not present

## 2023-09-27 DIAGNOSIS — M24542 Contracture, left hand: Secondary | ICD-10-CM | POA: Diagnosis not present

## 2023-09-27 DIAGNOSIS — G20A1 Parkinson's disease without dyskinesia, without mention of fluctuations: Secondary | ICD-10-CM | POA: Diagnosis not present

## 2023-09-27 DIAGNOSIS — M800AXD Age-related osteoporosis with current pathological fracture, other site, subsequent encounter for fracture with routine healing: Secondary | ICD-10-CM | POA: Diagnosis not present

## 2023-09-27 DIAGNOSIS — F419 Anxiety disorder, unspecified: Secondary | ICD-10-CM | POA: Diagnosis not present

## 2023-09-27 DIAGNOSIS — K5909 Other constipation: Secondary | ICD-10-CM | POA: Diagnosis not present

## 2023-09-28 ENCOUNTER — Telehealth: Payer: Self-pay

## 2023-09-28 DIAGNOSIS — M8008XD Age-related osteoporosis with current pathological fracture, vertebra(e), subsequent encounter for fracture with routine healing: Secondary | ICD-10-CM | POA: Diagnosis not present

## 2023-09-28 DIAGNOSIS — I10 Essential (primary) hypertension: Secondary | ICD-10-CM | POA: Diagnosis not present

## 2023-09-28 DIAGNOSIS — F429 Obsessive-compulsive disorder, unspecified: Secondary | ICD-10-CM | POA: Diagnosis not present

## 2023-09-28 DIAGNOSIS — F32A Depression, unspecified: Secondary | ICD-10-CM | POA: Diagnosis not present

## 2023-09-28 DIAGNOSIS — K5909 Other constipation: Secondary | ICD-10-CM | POA: Diagnosis not present

## 2023-09-28 DIAGNOSIS — G20A1 Parkinson's disease without dyskinesia, without mention of fluctuations: Secondary | ICD-10-CM | POA: Diagnosis not present

## 2023-09-28 DIAGNOSIS — M800AXD Age-related osteoporosis with current pathological fracture, other site, subsequent encounter for fracture with routine healing: Secondary | ICD-10-CM | POA: Diagnosis not present

## 2023-09-28 DIAGNOSIS — F419 Anxiety disorder, unspecified: Secondary | ICD-10-CM | POA: Diagnosis not present

## 2023-09-28 DIAGNOSIS — M24542 Contracture, left hand: Secondary | ICD-10-CM | POA: Diagnosis not present

## 2023-09-28 NOTE — Telephone Encounter (Signed)
 Received Phone call from Nurse at [facility] about this patient. Doctor at [facility] read last notes from patients visit with Dr. Evonnie. Doctor did D/C patient from [facility] . Patient is having hallucinations and acting out. They are looking for any advice or recommendation for something to replace the Zyprexa . They are doing a UA now to see if she has a UTI  [phone] [facility]

## 2023-09-28 NOTE — Telephone Encounter (Signed)
 Called and left voicemail for [place] about this patient

## 2023-09-28 NOTE — Telephone Encounter (Signed)
 Called and left voicemail for Patricia [facility] about this patient

## 2023-09-30 DIAGNOSIS — K5909 Other constipation: Secondary | ICD-10-CM | POA: Diagnosis not present

## 2023-09-30 DIAGNOSIS — F32A Depression, unspecified: Secondary | ICD-10-CM | POA: Diagnosis not present

## 2023-09-30 DIAGNOSIS — I10 Essential (primary) hypertension: Secondary | ICD-10-CM | POA: Diagnosis not present

## 2023-09-30 DIAGNOSIS — G20A1 Parkinson's disease without dyskinesia, without mention of fluctuations: Secondary | ICD-10-CM | POA: Diagnosis not present

## 2023-09-30 DIAGNOSIS — M8008XD Age-related osteoporosis with current pathological fracture, vertebra(e), subsequent encounter for fracture with routine healing: Secondary | ICD-10-CM | POA: Diagnosis not present

## 2023-09-30 DIAGNOSIS — F419 Anxiety disorder, unspecified: Secondary | ICD-10-CM | POA: Diagnosis not present

## 2023-09-30 DIAGNOSIS — M24542 Contracture, left hand: Secondary | ICD-10-CM | POA: Diagnosis not present

## 2023-09-30 DIAGNOSIS — F429 Obsessive-compulsive disorder, unspecified: Secondary | ICD-10-CM | POA: Diagnosis not present

## 2023-09-30 DIAGNOSIS — M800AXD Age-related osteoporosis with current pathological fracture, other site, subsequent encounter for fracture with routine healing: Secondary | ICD-10-CM | POA: Diagnosis not present

## 2023-10-02 DIAGNOSIS — R443 Hallucinations, unspecified: Secondary | ICD-10-CM | POA: Diagnosis not present

## 2023-10-02 DIAGNOSIS — R627 Adult failure to thrive: Secondary | ICD-10-CM | POA: Diagnosis not present

## 2023-10-02 DIAGNOSIS — G20A1 Parkinson's disease without dyskinesia, without mention of fluctuations: Secondary | ICD-10-CM | POA: Diagnosis not present

## 2023-10-02 DIAGNOSIS — Z9181 History of falling: Secondary | ICD-10-CM | POA: Diagnosis not present

## 2023-10-03 DIAGNOSIS — F29 Unspecified psychosis not due to a substance or known physiological condition: Secondary | ICD-10-CM | POA: Diagnosis not present

## 2023-10-03 DIAGNOSIS — R627 Adult failure to thrive: Secondary | ICD-10-CM | POA: Diagnosis not present

## 2023-10-03 DIAGNOSIS — G20C Parkinsonism, unspecified: Secondary | ICD-10-CM | POA: Diagnosis not present

## 2023-10-03 DIAGNOSIS — F419 Anxiety disorder, unspecified: Secondary | ICD-10-CM | POA: Diagnosis not present

## 2023-10-03 DIAGNOSIS — E46 Unspecified protein-calorie malnutrition: Secondary | ICD-10-CM | POA: Diagnosis not present

## 2023-10-03 DIAGNOSIS — F32A Depression, unspecified: Secondary | ICD-10-CM | POA: Diagnosis not present

## 2023-10-03 DIAGNOSIS — Z9181 History of falling: Secondary | ICD-10-CM | POA: Diagnosis not present

## 2023-10-03 DIAGNOSIS — M810 Age-related osteoporosis without current pathological fracture: Secondary | ICD-10-CM | POA: Diagnosis not present

## 2023-10-04 DIAGNOSIS — F32A Depression, unspecified: Secondary | ICD-10-CM | POA: Diagnosis not present

## 2023-10-04 DIAGNOSIS — I10 Essential (primary) hypertension: Secondary | ICD-10-CM | POA: Diagnosis not present

## 2023-10-04 DIAGNOSIS — K5909 Other constipation: Secondary | ICD-10-CM | POA: Diagnosis not present

## 2023-10-04 DIAGNOSIS — F419 Anxiety disorder, unspecified: Secondary | ICD-10-CM | POA: Diagnosis not present

## 2023-10-04 DIAGNOSIS — M8008XD Age-related osteoporosis with current pathological fracture, vertebra(e), subsequent encounter for fracture with routine healing: Secondary | ICD-10-CM | POA: Diagnosis not present

## 2023-10-04 DIAGNOSIS — G20A1 Parkinson's disease without dyskinesia, without mention of fluctuations: Secondary | ICD-10-CM | POA: Diagnosis not present

## 2023-10-04 DIAGNOSIS — F429 Obsessive-compulsive disorder, unspecified: Secondary | ICD-10-CM | POA: Diagnosis not present

## 2023-10-04 DIAGNOSIS — M24542 Contracture, left hand: Secondary | ICD-10-CM | POA: Diagnosis not present

## 2023-10-04 DIAGNOSIS — M800AXD Age-related osteoporosis with current pathological fracture, other site, subsequent encounter for fracture with routine healing: Secondary | ICD-10-CM | POA: Diagnosis not present

## 2023-10-05 DIAGNOSIS — R2689 Other abnormalities of gait and mobility: Secondary | ICD-10-CM | POA: Diagnosis not present

## 2023-10-06 DIAGNOSIS — M24542 Contracture, left hand: Secondary | ICD-10-CM | POA: Diagnosis not present

## 2023-10-06 DIAGNOSIS — I10 Essential (primary) hypertension: Secondary | ICD-10-CM | POA: Diagnosis not present

## 2023-10-06 DIAGNOSIS — M8008XD Age-related osteoporosis with current pathological fracture, vertebra(e), subsequent encounter for fracture with routine healing: Secondary | ICD-10-CM | POA: Diagnosis not present

## 2023-10-06 DIAGNOSIS — F32A Depression, unspecified: Secondary | ICD-10-CM | POA: Diagnosis not present

## 2023-10-06 DIAGNOSIS — F429 Obsessive-compulsive disorder, unspecified: Secondary | ICD-10-CM | POA: Diagnosis not present

## 2023-10-06 DIAGNOSIS — K5909 Other constipation: Secondary | ICD-10-CM | POA: Diagnosis not present

## 2023-10-06 DIAGNOSIS — M800AXD Age-related osteoporosis with current pathological fracture, other site, subsequent encounter for fracture with routine healing: Secondary | ICD-10-CM | POA: Diagnosis not present

## 2023-10-06 DIAGNOSIS — F419 Anxiety disorder, unspecified: Secondary | ICD-10-CM | POA: Diagnosis not present

## 2023-10-06 DIAGNOSIS — G20A1 Parkinson's disease without dyskinesia, without mention of fluctuations: Secondary | ICD-10-CM | POA: Diagnosis not present

## 2023-10-07 DIAGNOSIS — M800AXD Age-related osteoporosis with current pathological fracture, other site, subsequent encounter for fracture with routine healing: Secondary | ICD-10-CM | POA: Diagnosis not present

## 2023-10-07 DIAGNOSIS — M8008XD Age-related osteoporosis with current pathological fracture, vertebra(e), subsequent encounter for fracture with routine healing: Secondary | ICD-10-CM | POA: Diagnosis not present

## 2023-10-07 DIAGNOSIS — G20A1 Parkinson's disease without dyskinesia, without mention of fluctuations: Secondary | ICD-10-CM | POA: Diagnosis not present

## 2023-10-07 DIAGNOSIS — I10 Essential (primary) hypertension: Secondary | ICD-10-CM | POA: Diagnosis not present

## 2023-10-07 DIAGNOSIS — F419 Anxiety disorder, unspecified: Secondary | ICD-10-CM | POA: Diagnosis not present

## 2023-10-07 DIAGNOSIS — F32A Depression, unspecified: Secondary | ICD-10-CM | POA: Diagnosis not present

## 2023-10-07 DIAGNOSIS — F429 Obsessive-compulsive disorder, unspecified: Secondary | ICD-10-CM | POA: Diagnosis not present

## 2023-10-07 DIAGNOSIS — M24542 Contracture, left hand: Secondary | ICD-10-CM | POA: Diagnosis not present

## 2023-10-10 DIAGNOSIS — K5909 Other constipation: Secondary | ICD-10-CM | POA: Diagnosis not present

## 2023-10-10 DIAGNOSIS — G20A1 Parkinson's disease without dyskinesia, without mention of fluctuations: Secondary | ICD-10-CM | POA: Diagnosis not present

## 2023-10-10 DIAGNOSIS — F32A Depression, unspecified: Secondary | ICD-10-CM | POA: Diagnosis not present

## 2023-10-10 DIAGNOSIS — I10 Essential (primary) hypertension: Secondary | ICD-10-CM | POA: Diagnosis not present

## 2023-10-10 DIAGNOSIS — F419 Anxiety disorder, unspecified: Secondary | ICD-10-CM | POA: Diagnosis not present

## 2023-10-10 DIAGNOSIS — F429 Obsessive-compulsive disorder, unspecified: Secondary | ICD-10-CM | POA: Diagnosis not present

## 2023-10-10 DIAGNOSIS — M8008XD Age-related osteoporosis with current pathological fracture, vertebra(e), subsequent encounter for fracture with routine healing: Secondary | ICD-10-CM | POA: Diagnosis not present

## 2023-10-10 DIAGNOSIS — M800AXD Age-related osteoporosis with current pathological fracture, other site, subsequent encounter for fracture with routine healing: Secondary | ICD-10-CM | POA: Diagnosis not present

## 2023-10-10 DIAGNOSIS — M24542 Contracture, left hand: Secondary | ICD-10-CM | POA: Diagnosis not present

## 2023-10-11 DIAGNOSIS — M47812 Spondylosis without myelopathy or radiculopathy, cervical region: Secondary | ICD-10-CM | POA: Diagnosis not present

## 2023-10-11 DIAGNOSIS — T07XXXA Unspecified multiple injuries, initial encounter: Secondary | ICD-10-CM | POA: Diagnosis not present

## 2023-10-11 DIAGNOSIS — Z8673 Personal history of transient ischemic attack (TIA), and cerebral infarction without residual deficits: Secondary | ICD-10-CM | POA: Diagnosis not present

## 2023-10-11 DIAGNOSIS — R519 Headache, unspecified: Secondary | ICD-10-CM | POA: Diagnosis not present

## 2023-10-11 DIAGNOSIS — M4802 Spinal stenosis, cervical region: Secondary | ICD-10-CM | POA: Diagnosis not present

## 2023-10-11 DIAGNOSIS — S0990XA Unspecified injury of head, initial encounter: Secondary | ICD-10-CM | POA: Diagnosis not present

## 2023-10-11 DIAGNOSIS — W19XXXA Unspecified fall, initial encounter: Secondary | ICD-10-CM | POA: Diagnosis not present

## 2023-10-11 DIAGNOSIS — M24542 Contracture, left hand: Secondary | ICD-10-CM | POA: Diagnosis not present

## 2023-10-11 DIAGNOSIS — F429 Obsessive-compulsive disorder, unspecified: Secondary | ICD-10-CM | POA: Diagnosis not present

## 2023-10-11 DIAGNOSIS — Z96642 Presence of left artificial hip joint: Secondary | ICD-10-CM | POA: Diagnosis not present

## 2023-10-11 DIAGNOSIS — R531 Weakness: Secondary | ICD-10-CM | POA: Diagnosis not present

## 2023-10-11 DIAGNOSIS — G20A1 Parkinson's disease without dyskinesia, without mention of fluctuations: Secondary | ICD-10-CM | POA: Diagnosis not present

## 2023-10-11 DIAGNOSIS — M25511 Pain in right shoulder: Secondary | ICD-10-CM | POA: Diagnosis not present

## 2023-10-11 DIAGNOSIS — F0280 Dementia in other diseases classified elsewhere without behavioral disturbance: Secondary | ICD-10-CM | POA: Diagnosis not present

## 2023-10-11 DIAGNOSIS — M25552 Pain in left hip: Secondary | ICD-10-CM | POA: Diagnosis not present

## 2023-10-11 DIAGNOSIS — Z20822 Contact with and (suspected) exposure to covid-19: Secondary | ICD-10-CM | POA: Diagnosis not present

## 2023-10-11 DIAGNOSIS — R9082 White matter disease, unspecified: Secondary | ICD-10-CM | POA: Diagnosis not present

## 2023-10-11 DIAGNOSIS — K5909 Other constipation: Secondary | ICD-10-CM | POA: Diagnosis not present

## 2023-10-11 DIAGNOSIS — F32A Depression, unspecified: Secondary | ICD-10-CM | POA: Diagnosis not present

## 2023-10-11 DIAGNOSIS — F419 Anxiety disorder, unspecified: Secondary | ICD-10-CM | POA: Diagnosis not present

## 2023-10-11 DIAGNOSIS — M25551 Pain in right hip: Secondary | ICD-10-CM | POA: Diagnosis not present

## 2023-10-11 DIAGNOSIS — M8008XD Age-related osteoporosis with current pathological fracture, vertebra(e), subsequent encounter for fracture with routine healing: Secondary | ICD-10-CM | POA: Diagnosis not present

## 2023-10-11 DIAGNOSIS — Z7401 Bed confinement status: Secondary | ICD-10-CM | POA: Diagnosis not present

## 2023-10-11 DIAGNOSIS — M542 Cervicalgia: Secondary | ICD-10-CM | POA: Diagnosis not present

## 2023-10-11 DIAGNOSIS — R4182 Altered mental status, unspecified: Secondary | ICD-10-CM | POA: Diagnosis not present

## 2023-10-11 DIAGNOSIS — M800AXD Age-related osteoporosis with current pathological fracture, other site, subsequent encounter for fracture with routine healing: Secondary | ICD-10-CM | POA: Diagnosis not present

## 2023-10-11 DIAGNOSIS — Z471 Aftercare following joint replacement surgery: Secondary | ICD-10-CM | POA: Diagnosis not present

## 2023-10-11 DIAGNOSIS — I10 Essential (primary) hypertension: Secondary | ICD-10-CM | POA: Diagnosis not present

## 2023-10-11 DIAGNOSIS — R102 Pelvic and perineal pain: Secondary | ICD-10-CM | POA: Diagnosis not present

## 2023-10-11 DIAGNOSIS — M25572 Pain in left ankle and joints of left foot: Secondary | ICD-10-CM | POA: Diagnosis not present

## 2023-10-12 DIAGNOSIS — I10 Essential (primary) hypertension: Secondary | ICD-10-CM | POA: Diagnosis not present

## 2023-10-12 DIAGNOSIS — F419 Anxiety disorder, unspecified: Secondary | ICD-10-CM | POA: Diagnosis not present

## 2023-10-12 DIAGNOSIS — F32A Depression, unspecified: Secondary | ICD-10-CM | POA: Diagnosis not present

## 2023-10-12 DIAGNOSIS — M24542 Contracture, left hand: Secondary | ICD-10-CM | POA: Diagnosis not present

## 2023-10-12 DIAGNOSIS — M8008XD Age-related osteoporosis with current pathological fracture, vertebra(e), subsequent encounter for fracture with routine healing: Secondary | ICD-10-CM | POA: Diagnosis not present

## 2023-10-12 DIAGNOSIS — M800AXD Age-related osteoporosis with current pathological fracture, other site, subsequent encounter for fracture with routine healing: Secondary | ICD-10-CM | POA: Diagnosis not present

## 2023-10-12 DIAGNOSIS — K5909 Other constipation: Secondary | ICD-10-CM | POA: Diagnosis not present

## 2023-10-12 DIAGNOSIS — G20A1 Parkinson's disease without dyskinesia, without mention of fluctuations: Secondary | ICD-10-CM | POA: Diagnosis not present

## 2023-10-12 DIAGNOSIS — F429 Obsessive-compulsive disorder, unspecified: Secondary | ICD-10-CM | POA: Diagnosis not present

## 2023-10-18 DIAGNOSIS — G20A1 Parkinson's disease without dyskinesia, without mention of fluctuations: Secondary | ICD-10-CM | POA: Diagnosis not present

## 2023-10-18 DIAGNOSIS — F429 Obsessive-compulsive disorder, unspecified: Secondary | ICD-10-CM | POA: Diagnosis not present

## 2023-10-18 DIAGNOSIS — I10 Essential (primary) hypertension: Secondary | ICD-10-CM | POA: Diagnosis not present

## 2023-10-18 DIAGNOSIS — F0284 Dementia in other diseases classified elsewhere, unspecified severity, with anxiety: Secondary | ICD-10-CM | POA: Diagnosis not present

## 2023-10-18 DIAGNOSIS — S51001D Unspecified open wound of right elbow, subsequent encounter: Secondary | ICD-10-CM | POA: Diagnosis not present

## 2023-10-18 DIAGNOSIS — F0283 Dementia in other diseases classified elsewhere, unspecified severity, with mood disturbance: Secondary | ICD-10-CM | POA: Diagnosis not present

## 2023-10-18 DIAGNOSIS — F32A Depression, unspecified: Secondary | ICD-10-CM | POA: Diagnosis not present

## 2023-10-20 DIAGNOSIS — I10 Essential (primary) hypertension: Secondary | ICD-10-CM | POA: Diagnosis not present

## 2023-10-20 DIAGNOSIS — M810 Age-related osteoporosis without current pathological fracture: Secondary | ICD-10-CM | POA: Diagnosis not present

## 2023-10-20 DIAGNOSIS — E46 Unspecified protein-calorie malnutrition: Secondary | ICD-10-CM | POA: Diagnosis not present

## 2023-10-20 DIAGNOSIS — R627 Adult failure to thrive: Secondary | ICD-10-CM | POA: Diagnosis not present

## 2023-10-20 DIAGNOSIS — F32A Depression, unspecified: Secondary | ICD-10-CM | POA: Diagnosis not present

## 2023-10-20 DIAGNOSIS — G20A1 Parkinson's disease without dyskinesia, without mention of fluctuations: Secondary | ICD-10-CM | POA: Diagnosis not present

## 2023-10-20 DIAGNOSIS — F419 Anxiety disorder, unspecified: Secondary | ICD-10-CM | POA: Diagnosis not present

## 2023-10-27 DIAGNOSIS — F32A Depression, unspecified: Secondary | ICD-10-CM | POA: Diagnosis not present

## 2023-10-27 DIAGNOSIS — S32000D Wedge compression fracture of unspecified lumbar vertebra, subsequent encounter for fracture with routine healing: Secondary | ICD-10-CM | POA: Diagnosis not present

## 2023-10-27 DIAGNOSIS — G20A1 Parkinson's disease without dyskinesia, without mention of fluctuations: Secondary | ICD-10-CM | POA: Diagnosis not present

## 2023-10-27 DIAGNOSIS — F411 Generalized anxiety disorder: Secondary | ICD-10-CM | POA: Diagnosis not present

## 2023-10-27 DIAGNOSIS — I10 Essential (primary) hypertension: Secondary | ICD-10-CM | POA: Diagnosis not present

## 2023-11-02 ENCOUNTER — Encounter: Payer: Self-pay | Admitting: Neurology

## 2023-11-04 DIAGNOSIS — F411 Generalized anxiety disorder: Secondary | ICD-10-CM | POA: Diagnosis not present

## 2023-11-05 DIAGNOSIS — R2689 Other abnormalities of gait and mobility: Secondary | ICD-10-CM | POA: Diagnosis not present

## 2023-11-10 DIAGNOSIS — G20A1 Parkinson's disease without dyskinesia, without mention of fluctuations: Secondary | ICD-10-CM | POA: Diagnosis not present

## 2023-11-10 DIAGNOSIS — E46 Unspecified protein-calorie malnutrition: Secondary | ICD-10-CM | POA: Diagnosis not present

## 2023-11-14 DIAGNOSIS — F411 Generalized anxiety disorder: Secondary | ICD-10-CM | POA: Diagnosis not present

## 2023-11-14 DIAGNOSIS — G20A1 Parkinson's disease without dyskinesia, without mention of fluctuations: Secondary | ICD-10-CM | POA: Diagnosis not present

## 2023-11-14 DIAGNOSIS — F32A Depression, unspecified: Secondary | ICD-10-CM | POA: Diagnosis not present

## 2023-11-17 ENCOUNTER — Emergency Department (HOSPITAL_COMMUNITY)

## 2023-11-17 ENCOUNTER — Other Ambulatory Visit: Payer: Self-pay

## 2023-11-17 ENCOUNTER — Encounter (HOSPITAL_COMMUNITY): Payer: Self-pay | Admitting: Pharmacy Technician

## 2023-11-17 ENCOUNTER — Emergency Department (HOSPITAL_COMMUNITY): Admission: EM | Admit: 2023-11-17 | Discharge: 2023-11-17 | Disposition: A | Discharge status: Home / Self Care

## 2023-11-17 DIAGNOSIS — S0990XA Unspecified injury of head, initial encounter: Secondary | ICD-10-CM | POA: Diagnosis present

## 2023-11-17 DIAGNOSIS — F0390 Unspecified dementia without behavioral disturbance: Secondary | ICD-10-CM | POA: Insufficient documentation

## 2023-11-17 DIAGNOSIS — G20A1 Parkinson's disease without dyskinesia, without mention of fluctuations: Secondary | ICD-10-CM | POA: Diagnosis not present

## 2023-11-17 DIAGNOSIS — W19XXXA Unspecified fall, initial encounter: Secondary | ICD-10-CM | POA: Insufficient documentation

## 2023-11-17 DIAGNOSIS — I10 Essential (primary) hypertension: Secondary | ICD-10-CM | POA: Insufficient documentation

## 2023-11-17 DIAGNOSIS — Z79899 Other long term (current) drug therapy: Secondary | ICD-10-CM | POA: Diagnosis not present

## 2023-11-17 DIAGNOSIS — Y92129 Unspecified place in nursing home as the place of occurrence of the external cause: Secondary | ICD-10-CM | POA: Diagnosis not present

## 2023-11-17 NOTE — ED Triage Notes (Signed)
 Pt bib ems from [place] after unwitnessed fall. Unsure if hit head. A&OX2 (baseline confusion). EMS attempted to place in [place] but pt did not tolerate it. Pt not on anticoags. 112/90 HR 70 93% CBG 131

## 2023-11-17 NOTE — Discharge Instructions (Signed)
 CT of the cervical spine as well as x-rays of the chest and pelvis without evidence of acute injury from fall.  Reassuring evaluation, stable for discharge back to skilled nursing facility.

## 2023-11-17 NOTE — ED Notes (Signed)
 Patient discharged back to [facility]. Patient resting comfortably on gurney. Patient is at her baseline. Patient cleaned and dressed. Patient breathing without difficulty. Discharge paperwork given to [facility].

## 2023-11-17 NOTE — ED Provider Notes (Signed)
 [facility] AT [facility] Provider Note   CSN: [id number] Arrival date & time: 11/17/23  1240     Patient presents with: Patricia Fitzpatrick   Patricia Fitzpatrick is a 78 y.o. female.  {Add pertinent medical, surgical, social history, OB history to HPI:32947} Patricia Fitzpatrick is a 78 y.o. female   The history is provided by medical records, the patient and the EMS personnel.  Fall       Prior to Admission medications   Medication Sig Start Date End Date Taking? Authorizing Provider  acetaminophen  (TYLENOL ) 500 MG tablet Take 2 tablets (1,000 mg total) by mouth 3 (three) times daily. Patient taking differently: Take 1,000 mg by mouth every 8 (eight) hours as needed for mild pain (pain score 1-3) or moderate pain (pain score 4-6). 09/03/22   Rosario Leatrice FERNS, MD  amLODipine  (NORVASC ) 5 MG tablet Take 1 tablet (5 mg total) by mouth daily. 09/03/22   Rosario Leatrice FERNS, MD  busPIRone (BUSPAR) 5 MG tablet Take 5 mg by mouth 2 (two) times daily. 07/08/23   [provider]  carbidopa -levodopa  (SINEMET  IR) 25-100 MG tablet Take 2 at 8AM / Take 1 at noon /and Take 1 at 4pm Should be given 30 min prior to protein (okay with carbohydrate) 07/27/23   Tat, Asberry RAMAN, DO  COLACE 100 MG capsule Take 100 mg by mouth daily as needed. 02/03/23   [provider]  FLUoxetine  (PROZAC ) 40 MG capsule Take 40 mg by mouth 2 (two) times daily.    [provider]  HYDROcodone -acetaminophen  (NORCO/VICODIN) 5-325 MG tablet Take 1 tablet by mouth every 4 (four) hours as needed. 06/20/23   [provider]  OLANZapine  (ZYPREXA ) 5 MG tablet Take 5 mg by mouth 2 (two) times daily. 07/14/23   [provider]  ondansetron  (ZOFRAN ) 4 MG tablet Take 4 mg by mouth every 8 (eight) hours as needed. 05/04/23   [provider]    Allergies: Patient has no known allergies.    Review of Systems  Unable to perform ROS: Dementia    Updated Vital Signs BP 111/69    Pulse 70   Temp (!) 97.4 F (36.3 C)   Resp 16   SpO2 99%   Physical Exam Vitals and nursing note reviewed.  Constitutional:      General: She is not in acute distress.    Appearance: Normal appearance. She is well-developed. She is not ill-appearing or diaphoretic.     Comments: Alert, at baseline mental status  HENT:     Head: Normocephalic and atraumatic.  Eyes:     General:        Right eye: No discharge.        Left eye: No discharge.     Pupils: Pupils are equal, round, and reactive to light.  Cardiovascular:     Rate and Rhythm: Normal rate and regular rhythm.     Pulses: Normal pulses.     Heart sounds: Normal heart sounds.  Pulmonary:     Effort: Pulmonary effort is normal. No respiratory distress.     Breath sounds: Normal breath sounds. No wheezing or rales.     Comments: Respirations equal and unlabored, patient able to speak in full sentences, lungs clear to auscultation bilaterally  Abdominal:     General: Bowel sounds are normal. There is no distension.     Palpations: Abdomen is soft. There is no mass.     Tenderness: There is no abdominal  tenderness. There is no guarding.     Comments: Abdomen soft, nondistended, nontender to palpation in all quadrants without guarding or peritoneal signs  Musculoskeletal:        General: No deformity.     Cervical back: Neck supple.  Skin:    General: Skin is warm and dry.     Capillary Refill: Capillary refill takes less than 2 seconds.  Neurological:     Mental Status: She is alert and oriented to person, place, and time.     Coordination: Coordination normal.     Comments: Speech is clear, able to follow commands CN III-XII intact Normal strength in upper and lower extremities bilaterally including dorsiflexion and plantar flexion, strong and equal grip strength Sensation normal to light and sharp touch Moves extremities without ataxia, coordination intact  Psychiatric:        Mood and Affect: Mood normal.         Behavior: Behavior normal.     (all labs ordered are listed, but only abnormal results are displayed) Labs Reviewed - No data to display  EKG: None  Radiology: DG Pelvis 1-2 Views Result Date: 11/17/2023 CLINICAL DATA:  Fall, injury EXAM: PELVIS - 1-2 VIEW COMPARISON:  January 07, 2023 FINDINGS: Diffuse osteopenia. Status post left hip arthroplasty, similar to prior. No joint dislocation. No definite acute fractures identified. Old chronic fracture deformities involving the left superior inferior pubic rami and right parasymphyseal pubic bone. Large rectal stool burden, limiting evaluation somewhat. IMPRESSION: 1. No definite acute fractures identified, although cross-sectional imaging may be helpful for further evaluation if there is persistent concern as radiographs may not be sensitive in this patient population. 2.  Status post left hip arthroplasty. 3.  Unchanged remote pubic bone fractures. Electronically Signed   By: Michaeline Blanch M.D.   On: 11/17/2023 13:46   CT Cervical Spine Wo Contrast Result Date: 11/17/2023 CLINICAL DATA:  Neck injury after fall EXAM: CT CERVICAL SPINE WITHOUT CONTRAST TECHNIQUE: Multidetector CT imaging of the cervical spine was performed without intravenous contrast. Multiplanar CT image reconstructions were also generated. RADIATION DOSE REDUCTION: This exam was performed according to the departmental dose-optimization program which includes automated exposure control, adjustment of the mA and/or kV according to patient size and/or use of iterative reconstruction technique. COMPARISON:  Same day head CT FINDINGS: Alignment: Normal. Skull base and vertebrae: No acute fracture. No primary bone lesion or focal pathologic process. Soft tissues and spinal canal: No prevertebral fluid or swelling. No visible canal hematoma. Disc levels: Moderate multilevel intervertebral disc space narrowing, endplate osteophytosis, and facet arthrosis. Upper chest: Unremarkable. Other:  None. IMPRESSION: No acute cervical spine fracture identified. Moderate multilevel degenerative disc disease. Electronically Signed   By: Michaeline Blanch M.D.   On: 11/17/2023 13:43   CT Head Wo Contrast Result Date: 11/17/2023 CLINICAL DATA:  Head trauma, injury after fall EXAM: CT HEAD WITHOUT CONTRAST TECHNIQUE: Contiguous axial images were obtained from the base of the skull through the vertex without intravenous contrast. RADIATION DOSE REDUCTION: This exam was performed according to the departmental dose-optimization program which includes automated exposure control, adjustment of the mA and/or kV according to patient size and/or use of iterative reconstruction technique. COMPARISON:  Same day cervical spine radiographs. FINDINGS: Brain: Hypodensity in the left frontal lobe adjacent to the left caudate nucleus, presumably related to prior infarct. There is adjacent ex vacuo dilation of the left lateral ventricle. Nonspecific periventricular and subcortical white matter hypodensities, likely secondary to chronic microvascular disease. No acute  intracranial hemorrhage. No midline shift. Basal cisterns are patent. Vascular: Calcifications of the carotid siphons. Skull: No acute findings. Sinuses/Orbits: No acute finding. Other: None. 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 No definite acute intracranial pathology. Electronically Signed   By: Michaeline Blanch M.D.   On: 11/17/2023 13:40   DG Chest 1 View Result Date: 11/17/2023 CLINICAL DATA:  Fall EXAM: CHEST  1 VIEW COMPARISON:  January 07, 2023 and prior studies FINDINGS: Low lung volumes. No focal consolidation. No pleural effusions or pneumothorax. Unchanged cardiomediastinal silhouette. No acute osseous findings. IMPRESSION: No acute findings. Electronically Signed   By: Michaeline Blanch M.D.   On: 11/17/2023 13:37    {Document cardiac monitor, telemetry assessment procedure when appropriate:32947} Procedures   Medications Ordered in the ED -  No data to display    {Click here for ABCD2, HEART and other calculators REFRESH Note before signing:1}                              Medical Decision Making Amount and/or Complexity of Data Reviewed Radiology: ordered.   ***  {Document critical care time when appropriate  Document review of labs and clinical decision tools ie CHADS2VASC2, etc  Document your independent review of radiology images and any outside records  Document your discussion with family members, caretakers and with consultants  Document social determinants of health affecting pt's care  Document your decision making why or why not admission, treatments were needed:32947:::1}   Final diagnoses:  Fall, initial encounter    ED Discharge Orders     None

## 2023-11-18 DIAGNOSIS — R2681 Unsteadiness on feet: Secondary | ICD-10-CM | POA: Diagnosis not present

## 2023-11-18 DIAGNOSIS — R293 Abnormal posture: Secondary | ICD-10-CM | POA: Diagnosis not present

## 2023-11-18 DIAGNOSIS — G20A1 Parkinson's disease without dyskinesia, without mention of fluctuations: Secondary | ICD-10-CM | POA: Diagnosis not present

## 2023-11-18 DIAGNOSIS — R296 Repeated falls: Secondary | ICD-10-CM | POA: Diagnosis not present

## 2023-11-23 DIAGNOSIS — R296 Repeated falls: Secondary | ICD-10-CM | POA: Diagnosis not present

## 2023-11-23 DIAGNOSIS — G20A1 Parkinson's disease without dyskinesia, without mention of fluctuations: Secondary | ICD-10-CM | POA: Diagnosis not present

## 2023-11-23 DIAGNOSIS — R293 Abnormal posture: Secondary | ICD-10-CM | POA: Diagnosis not present

## 2023-11-24 DIAGNOSIS — G20A1 Parkinson's disease without dyskinesia, without mention of fluctuations: Secondary | ICD-10-CM | POA: Diagnosis not present

## 2023-11-24 DIAGNOSIS — R296 Repeated falls: Secondary | ICD-10-CM | POA: Diagnosis not present

## 2023-11-24 DIAGNOSIS — R2681 Unsteadiness on feet: Secondary | ICD-10-CM | POA: Diagnosis not present

## 2023-11-24 DIAGNOSIS — R293 Abnormal posture: Secondary | ICD-10-CM | POA: Diagnosis not present

## 2024-02-17 ENCOUNTER — Ambulatory Visit: Admitting: Neurology

## 2024-02-20 ENCOUNTER — Emergency Department (HOSPITAL_COMMUNITY)

## 2024-02-20 ENCOUNTER — Emergency Department (HOSPITAL_COMMUNITY)
Admission: EM | Admit: 2024-02-20 | Discharge: 2024-02-20 | Disposition: A | Discharge status: Home / Self Care | Attending: Emergency Medicine | Admitting: Emergency Medicine

## 2024-02-20 ENCOUNTER — Other Ambulatory Visit: Payer: Self-pay

## 2024-02-20 ENCOUNTER — Encounter (HOSPITAL_COMMUNITY): Payer: Self-pay

## 2024-02-20 DIAGNOSIS — S51811A Laceration without foreign body of right forearm, initial encounter: Secondary | ICD-10-CM | POA: Insufficient documentation

## 2024-02-20 DIAGNOSIS — S51819A Laceration without foreign body of unspecified forearm, initial encounter: Secondary | ICD-10-CM

## 2024-02-20 DIAGNOSIS — S51812A Laceration without foreign body of left forearm, initial encounter: Secondary | ICD-10-CM | POA: Insufficient documentation

## 2024-02-20 DIAGNOSIS — W19XXXA Unspecified fall, initial encounter: Secondary | ICD-10-CM

## 2024-02-20 DIAGNOSIS — I10 Essential (primary) hypertension: Secondary | ICD-10-CM | POA: Insufficient documentation

## 2024-02-20 DIAGNOSIS — Y92000 Kitchen of unspecified non-institutional (private) residence as  the place of occurrence of the external cause: Secondary | ICD-10-CM | POA: Insufficient documentation

## 2024-02-20 DIAGNOSIS — Z96642 Presence of left artificial hip joint: Secondary | ICD-10-CM | POA: Insufficient documentation

## 2024-02-20 DIAGNOSIS — G20A1 Parkinson's disease without dyskinesia, without mention of fluctuations: Secondary | ICD-10-CM | POA: Insufficient documentation

## 2024-02-20 DIAGNOSIS — Z79899 Other long term (current) drug therapy: Secondary | ICD-10-CM | POA: Insufficient documentation

## 2024-02-20 LAB — COMPREHENSIVE METABOLIC PANEL WITH GFR
ALT: <5 U/L (ref 0–44)
AST: 25 U/L (ref 15–41)
Albumin: 3.6 g/dL (ref 3.5–5.0)
Alkaline Phosphatase: 74 U/L (ref 38–126)
Anion gap: 11 (ref 5–15)
BUN: 29 mg/dL — ABNORMAL HIGH (ref 8–23)
CO2: 24 mmol/L (ref 22–32)
Calcium: 8.9 mg/dL (ref 8.9–10.3)
Chloride: 103 mmol/L (ref 98–111)
Creatinine, Ser: 0.73 mg/dL (ref 0.44–1.00)
GFR, Estimated: >60 mL/min
Glucose, Bld: 123 mg/dL — ABNORMAL HIGH (ref 70–99)
Potassium: 4.3 mmol/L (ref 3.5–5.1)
Sodium: 138 mmol/L (ref 135–145)
Total Bilirubin: 0.3 mg/dL (ref 0.0–1.2)
Total Protein: 6 g/dL — ABNORMAL LOW (ref 6.5–8.1)

## 2024-02-20 LAB — CBC
HCT: 38.2 % (ref 36.0–46.0)
Hemoglobin: 12.4 g/dL (ref 12.0–15.0)
MCH: 32.3 pg (ref 26.0–34.0)
MCHC: 32.5 g/dL (ref 30.0–36.0)
MCV: 99.5 fL (ref 80.0–100.0)
Platelets: 281 K/uL (ref 150–400)
RBC: 3.84 MIL/uL — ABNORMAL LOW (ref 3.87–5.11)
RDW: 13.7 % (ref 11.5–15.5)
WBC: 5.4 K/uL (ref 4.0–10.5)
nRBC: 0 % (ref 0.0–0.2)

## 2024-02-20 LAB — URINALYSIS, ROUTINE W REFLEX MICROSCOPIC
Bilirubin Urine: NEGATIVE
Glucose, UA: 50 mg/dL — AB
Ketones, ur: NEGATIVE mg/dL
Nitrite: NEGATIVE
Protein, ur: 100 mg/dL — AB
Specific Gravity, Urine: 1.015 (ref 1.005–1.030)
Squamous Epithelial / HPF: >50 /HPF (ref 0–5)
pH: 5 (ref 5.0–8.0)

## 2024-02-20 LAB — CBG MONITORING, ED: Glucose-Capillary: 106 mg/dL — ABNORMAL HIGH (ref 70–99)

## 2024-02-20 NOTE — ED Provider Triage Note (Signed)
 Emergency Medicine Provider Triage Evaluation Note  Patricia Fitzpatrick , a 79 y.o. female  was evaluated in triage.  Pt complains of fall. Per EMS, she is a resident at [facility] with c/o unwitnessed fall from her wheelchair. She told EMS she fell but is not hurting anywhere but staff reported she was complaining of right hip pain. Alert to self and place, at baseline mentation. Skin tear to both left and right forearms. No blood thinners, no head injury.   Patient is A&Ox1-2 on arrival to ED. She does report a fall but gives a confabulated history about a fall from a ladder. She denies pain anywhere and states she is hoping to get something to eat. She is noted to have small skin tear to R elbow.  Review of Systems  As above  Physical Exam  BP 123/71 (BP Location: Right Arm)   Pulse 64   Temp 98.3 F (36.8 C) (Oral)   Resp 16   SpO2 98%  Gen:   Awake, no distress   Resp:  Normal effort  MSK:   Moves extremities without difficulty  Other:  A&Ox1-2, NCAT, no midline C-spine TTP, no chest wall TTP, stable hips, small skin tear on R elbow, dressed  Medical Decision Making  Medically screening exam initiated at 6:04 PM.  Appropriate orders placed.  [facility] was informed that the remainder of the evaluation will be completed by another provider, this initial triage assessment does not replace that evaluation, and the importance of remaining in the ED until their evaluation is complete.  HDS, afebrile, reportedly at mental status baseline.  Labs and imaging ordered.  Patient will be moved to a treatment space when 1 becomes available.   Patricia Sid SAILOR, MD 02/20/24 [phone]

## 2024-02-20 NOTE — Discharge Instructions (Signed)
 It was a pleasure caring for you today in the emergency department.  Please change the bandages on your skin tears twice daily.  You can clean with gentle soap and water .  You can apply topical antibiotic ointment to the skin tear and then apply a new clean dressing.  Please return to the emergency department for any worsening or worrisome symptoms.

## 2024-02-20 NOTE — ED Notes (Signed)
 Patient continues to ask for scissors to deflate her mattress. Patient informed the mattress is foam and would not deflate if punctured with scissors. Patient states the deal may be off before it even gets started. RN unsure what deal was made.

## 2024-02-20 NOTE — ED Provider Notes (Signed)
 " [place] EMERGENCY DEPARTMENT AT [facility] Provider Note  CSN: [id number] Arrival date & time: 02/20/24 1634  Chief Complaint(s) Fall  HPI Patricia Fitzpatrick is a 79 y.o. female with past medical history as below, significant for depression, hypertension, Parkinson's disease, hypertension who presents to the ED with complaint of fall  Here from [place] following fall from her wheelchair.  Patient reports that she fell from her wheelchair while trying to perform some activity in her kitchen with a sauce pan.  Does not believe she had a head injury.  Complaining of some pain to her forearms from the fall.  Initially was complaints of hip pain but this is since subsided.  Denies any difficulty breathing, chest pain abdominal pain.  No headache.  No change in bowel or bladder function.  She is requesting to leave  Past Medical History Past Medical History:  Diagnosis Date   Anxiety    Cataracts, bilateral    Depression    High cholesterol    Hypertension    OCD (obsessive compulsive disorder)    Parkinsonism (HCC) 10/14/2015   Tremor    Patient Active Problem List   Diagnosis Date Noted   Lumbar compression fracture, closed, initial encounter (HCC) 09/29/2022   Pubic ramus fracture, left, with routine healing, subsequent encounter 08/31/2022   Closed compression fracture of L1 lumbar vertebra, initial encounter (HCC) 08/31/2022   Dehydration 08/31/2022   Fall at home, subsequent encounter 08/30/2022   Delirium 08/25/2020   Closed displaced fracture of greater trochanter of left femur (HCC) 08/22/2020   Hypertension    Depression    Osteoporosis    Parkinson's disease (HCC) 10/14/2015   Tremor 10/14/2015   Memory change 10/14/2015   Home Medication(s) Prior to Admission medications  Medication Sig Start Date End Date Taking? Authorizing Provider  acetaminophen  (TYLENOL ) 500 MG tablet Take 2 tablets (1,000 mg total) by mouth 3 (three) times daily. Patient taking  differently: Take 1,000 mg by mouth every 8 (eight) hours as needed for mild pain (pain score 1-3) or moderate pain (pain score 4-6). 09/03/22   Rosario Leatrice FERNS, MD  amLODipine  (NORVASC ) 5 MG tablet Take 1 tablet (5 mg total) by mouth daily. 09/03/22   Rosario Leatrice FERNS, MD  busPIRone (BUSPAR) 5 MG tablet Take 5 mg by mouth 2 (two) times daily. 07/08/23   [provider]  carbidopa -levodopa  (SINEMET  IR) 25-100 MG tablet Take 2 at 8AM / Take 1 at noon /and Take 1 at 4pm Should be given 30 min prior to protein (okay with carbohydrate) 07/27/23   Tat, Asberry RAMAN, DO  COLACE 100 MG capsule Take 100 mg by mouth daily as needed. 02/03/23   [provider]  FLUoxetine  (PROZAC ) 40 MG capsule Take 40 mg by mouth 2 (two) times daily.    [provider]  HYDROcodone -acetaminophen  (NORCO/VICODIN) 5-325 MG tablet Take 1 tablet by mouth every 4 (four) hours as needed. 06/20/23   [provider]  OLANZapine  (ZYPREXA ) 5 MG tablet Take 5 mg by mouth 2 (two) times daily. 07/14/23   [provider]  ondansetron  (ZOFRAN ) 4 MG tablet Take 4 mg by mouth every 8 (eight) hours as needed. 05/04/23   [provider]  Past Surgical History Past Surgical History:  Procedure Laterality Date   BREAST BIOPSY  1981   CATARACT EXTRACTION Right 2017   EYE SURGERY  1952   HIP ARTHROPLASTY Left 08/23/2020   Procedure: ARTHROPLASTY BIPOLAR HIP (HEMIARTHROPLASTY);  Surgeon: Cristy Bonner DASEN, MD;  Location: [facility];  Service: Orthopedics;  Laterality: Left;   IR KYPHO THORACIC WITH BONE BIOPSY  10/04/2022   Family History Family History  Problem Relation Age of Onset   Pulmonary disease Mother    Alzheimer's disease Mother    Heart disease Father    Alzheimer's disease Father    Parkinson's disease Neg Hx     Social History Social  History[1] Allergies Patient has no known allergies.  Review of Systems A thorough review of systems was obtained and all systems are negative except as noted in the HPI and PMH.   Physical Exam Vital Signs  I have reviewed the triage vital signs BP (!) 140/81 (BP Location: Right Arm)   Pulse 73   Temp 98.7 F (37.1 C) (Oral)   Resp 15   SpO2 98%  Physical Exam Vitals and nursing note reviewed.  Constitutional:      General: She is not in acute distress.    Appearance: Normal appearance. She is well-developed. She is not ill-appearing.  HENT:     Head: Normocephalic and atraumatic.     Right Ear: External ear normal.     Left Ear: External ear normal.     Nose: Nose normal.     Mouth/Throat:     Mouth: Mucous membranes are moist.  Eyes:     General: No scleral icterus.       Right eye: No discharge.        Left eye: No discharge.     Extraocular Movements: Extraocular movements intact.     Pupils: Pupils are equal, round, and reactive to light.  Cardiovascular:     Rate and Rhythm: Normal rate.  Pulmonary:     Effort: Pulmonary effort is normal. No respiratory distress.     Breath sounds: No stridor.  Abdominal:     General: Abdomen is flat. There is no distension.     Tenderness: There is no guarding.  Musculoskeletal:        General: No deformity.     Cervical back: No rigidity.     Comments: Pelvis stable to AP pressure No discomfort with passive range of motion to extremities. Pulses equal to all 4 extremities    Skin:    General: Skin is warm and dry.     Coloration: Skin is not cyanotic, jaundiced or pale.     Comments: Skin tears noted to forearms Left upper extremity contracture  Neurological:     Mental Status: She is alert. Mental status is at baseline.     GCS: GCS eye subscore is 4. GCS verbal subscore is 5. GCS motor subscore is 6.     Comments: AO x 2/baseline per EMS   Psychiatric:        Speech: Speech normal.        Behavior: Behavior  normal. Behavior is cooperative.     ED Results and Treatments Labs (all labs ordered are listed, but only abnormal results are displayed) Labs Reviewed  COMPREHENSIVE METABOLIC PANEL WITH GFR - Abnormal; Notable for the following components:      Result Value   Glucose, Bld 123 (*)    BUN 29 (*)    Total Protein 6.0 (*)  All other components within normal limits  CBC - Abnormal; Notable for the following components:   RBC 3.84 (*)    All other components within normal limits  URINALYSIS, ROUTINE W REFLEX MICROSCOPIC - Abnormal; Notable for the following components:   APPearance TURBID (*)    Glucose, UA 50 (*)    Hgb urine dipstick SMALL (*)    Protein, ur 100 (*)    Leukocytes,Ua SMALL (*)    Bacteria, UA MANY (*)    All other components within normal limits  CBG MONITORING, ED - Abnormal; Notable for the following components:   Glucose-Capillary 106 (*)    All other components within normal limits                                                                                                                          Radiology DG Chest 1 View Result Date: 02/20/2024 EXAM: 1 VIEW(S) XRAY OF THE CHEST 02/20/2024 06:36:08 PM COMPARISON: 11/17/2023 CLINICAL HISTORY: 809823 Fall [id number] FINDINGS: LUNGS AND PLEURA: No focal pulmonary opacity. No pleural effusion. No pneumothorax. HEART AND MEDIASTINUM: No acute abnormality of the cardiac and mediastinal silhouettes. BONES AND SOFT TISSUES: No acute osseous abnormality. IMPRESSION: 1. No active cardiopulmonary disease. Electronically signed by: Franky Crease MD 02/20/2024 07:30 PM EST RP Workstation: [id number]   CT Cervical Spine Wo Contrast Result Date: 02/20/2024 EXAM: CT CERVICAL SPINE WITHOUT CONTRAST 02/20/2024 07:18:19 PM TECHNIQUE: CT of the cervical spine was performed without the administration of intravenous contrast. Multiplanar reformatted images are provided for review. Automated exposure control, iterative reconstruction,  and/or weight based adjustment of the mA/kV was utilized to reduce the radiation dose to as low as reasonably achievable. COMPARISON: 11/17/2023 CLINICAL HISTORY: Neck trauma (Age >= 65y) FINDINGS: BONES AND ALIGNMENT: No acute fracture or traumatic malalignment. DEGENERATIVE CHANGES: Mild bilateral degenerative facet disease diffusely. Early degenerative disc disease in the lower cervical spine. SOFT TISSUES: No prevertebral soft tissue swelling. IMPRESSION: 1. No evidence of acute traumatic injury. Electronically signed by: Franky Crease MD 02/20/2024 07:30 PM EST RP Workstation: [id number]   CT Head Wo Contrast Result Date: 02/20/2024 EXAM: CT HEAD WITHOUT 02/20/2024 07:18:19 PM TECHNIQUE: CT of the head was performed without the administration of intravenous contrast. Automated exposure control, iterative reconstruction, and/or weight based adjustment of the mA/kV was utilized to reduce the radiation dose to as low as reasonably achievable. COMPARISON: 11/17/2023 CLINICAL HISTORY: Head trauma, minor (Age >= 65y) FINDINGS: BRAIN AND VENTRICLES: No acute intracranial hemorrhage. No mass effect or midline shift. No extra-axial fluid collection. No evidence of acute infarct. No hydrocephalus. 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 Atrophy and chronic small vessel disease throughout the deep white matter. ORBITS: No acute abnormality. SINUSES AND MASTOIDS: No acute abnormality. SOFT TISSUES AND SKULL: No acute skull fracture. No acute soft tissue abnormality. IMPRESSION: 1. No acute intracranial abnormality. 2. 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 3. Atrophy, chronic small vessel disease. Electronically signed by: Franky Crease MD 02/20/2024 07:28  PM EST RP Workstation: [id number]   DG Pelvis 1-2 Views Result Date: 02/20/2024 EXAM: 1 or 2 VIEW(S) XRAY OF THE PELVIS 02/20/2024 06:36:08 PM COMPARISON: 10/25. CLINICAL HISTORY: 809823 Fall 190176. FINDINGS: BONES AND JOINTS: Prior left hip  hemiarthroplasty. Stable old left superior and inferior pubic rami fractures and right pubic bone fracture. No acute fracture, subluxation, or dislocation. SOFT TISSUES: The soft tissues are unremarkable. IMPRESSION: 1. No acute fracture, subluxation, or dislocation. 2. Stable old left superior and inferior pubic rami fractures and right pubic bone fracture. Electronically signed by: Franky Crease MD 02/20/2024 07:27 PM EST RP Workstation: [id number]    Pertinent labs & imaging results that were available during my care of the patient were reviewed by me and considered in my medical decision making (see MDM for details).  Medications Ordered in ED Medications - No data to display                                                                                                                                   Procedures Procedures  (including critical care time)  Medical Decision Making / ED Course    Medical Decision Making:    KAMIYA ACORD is a 79 y.o. female with past medical history as below, significant for depression, hypertension, Parkinson's disease, hypertension who presents to the ED with complaint of fall. The complaint involves an extensive differential diagnosis and also carries with it a high risk of complications and morbidity.  Serious etiology was considered. Ddx includes but is not limited to: Fracture, dislocation, sprain, strain, contusion, laceration, etc.  Complete initial physical exam performed, notably the patient was in no acute distress.    Reviewed and confirmed nursing documentation for past medical history, family history, social history.  Vital signs reviewed.    Fall from wheelchair> - Patient with fall from her wheelchair at nursing home - Imaging stable - She has skin tears to her forearms, wound care by nursing - Last tetanus July 2020 - Initially there was some concern of right hip pain.  She has full range of motion passive and active to her lower  extremities.  Pelvis is stable.  No pain with palpation of her pelvis or hips.  Doubt occult fracture - Patient currently has no complaints, denies any pain, reports that she feels fine.     10:57 PM:  I have discussed the diagnosis/risks/treatment options with the patient.  Evaluation and diagnostic testing in the emergency department does not suggest an emergent condition requiring admission or immediate intervention beyond what has been performed at this time.  They will follow up with pcp. We also discussed returning to the ED immediately if new or worsening sx occur. We discussed the sx which are most concerning (e.g., sudden worsening pain, fever, inability to tolerate by mouth) that necessitate immediate return.    The patient appears reasonably screened and/or stabilized for discharge and I doubt any other  medical condition or other [facility] requiring further screening, evaluation, or treatment in the ED at this time prior to discharge.                 Additional history obtained: -Additional history obtained from na -External records from outside source obtained and reviewed including: Chart review including previous notes, labs, imaging, consultation notes including  Prior ER evaluation   Lab Tests: -I ordered, reviewed, and interpreted labs.   The pertinent results include:   Labs Reviewed  COMPREHENSIVE METABOLIC PANEL WITH GFR - Abnormal; Notable for the following components:      Result Value   Glucose, Bld 123 (*)    BUN 29 (*)    Total Protein 6.0 (*)    All other components within normal limits  CBC - Abnormal; Notable for the following components:   RBC 3.84 (*)    All other components within normal limits  URINALYSIS, ROUTINE W REFLEX MICROSCOPIC - Abnormal; Notable for the following components:   APPearance TURBID (*)    Glucose, UA 50 (*)    Hgb urine dipstick SMALL (*)    Protein, ur 100 (*)    Leukocytes,Ua SMALL (*)    Bacteria, UA MANY (*)     All other components within normal limits  CBG MONITORING, ED - Abnormal; Notable for the following components:   Glucose-Capillary 106 (*)    All other components within normal limits    Notable for labs are stable  EKG   EKG Interpretation Date/Time:  Monday February 20 2024 18:12:43 EST Ventricular Rate:  62 PR Interval:  122 QRS Duration:  76 QT Interval:  448 QTC Calculation: 454 R Axis:   -1  Text Interpretation: Normal sinus rhythm Normal ECG When compared with ECG of 07-Jan-2023 12:07, PREVIOUS ECG IS PRESENT Interpretation limited secondary to artifact Reconfirmed by Elnor Savant ([id number]) on 02/20/2024 10:39:59 PM         Imaging Studies ordered: I ordered imaging studies including chest x-ray, pelvis x-ray, CT head and cervical spine I independently visualized the following imaging with scope of interpretation limited to determining acute life threatening conditions related to emergency care; findings noted above I agree with the radiologist interpretation If any imaging was obtained with contrast I closely monitored patient for any possible adverse reaction a/w contrast administration in the emergency department   Medicines ordered and prescription drug management: No orders of the defined types were placed in this encounter.   -I have reviewed the patients home medicines and have made adjustments as needed   Consultations Obtained: Not applicable  Cardiac Monitoring: Continuous pulse oximetry interpreted by myself, 99% on RA.    Social Determinants of Health:  Diagnosis or treatment significantly limited by social determinants of health: SNF resident   Reevaluation: After the interventions noted above, I reevaluated the patient and found that they have improved  Co morbidities that complicate the patient evaluation  Past Medical History:  Diagnosis Date   Anxiety    Cataracts, bilateral    Depression    High cholesterol    Hypertension    OCD  (obsessive compulsive disorder)    Parkinsonism (HCC) 10/14/2015   Tremor       Dispostion: Disposition decision including need for hospitalization was considered, and patient discharged from emergency department.    Final Clinical Impression(s) / ED Diagnoses Final diagnoses:  None         [1]  Social History Tobacco Use   Smoking status: Never  Passive exposure: Never   Smokeless tobacco: Never  Vaping Use   Vaping status: Never Used  Substance Use Topics   Alcohol  use: Yes    Comment: One glass per year. update 04/21/21 pt can't remember her last drink   Drug use: No     Elnor Jayson LABOR, DO 02/20/24 2257  "

## 2024-02-20 NOTE — ED Triage Notes (Signed)
 PER EMS: pt is from [facility] with c/o unwitnessed fall from her wheelchair. She told EMS she fell but is not hurting anywhere but staff reported she was complaining of right hip pain. Alert to self and place, at baseline mentation. Skin tear to both left and right forearms. No blood thinners, no head injury.   BP- 128/51 HR-62 O2-98% RA CBG-137

## 2024-03-01 NOTE — Progress Notes (Unsigned)
 "   Assessment/Plan:   1.  Parkinsons Disease               -Patient was diagnosed in 2017, but was on very low-dose of medication until very recently.  Motor sx's are actually very mild.              -Disease is now complicated by confusion, falls, memory change             - Now back on carbidopa /levodopa  25/100, 2 in the morning, 1 in the afternoon and 1 in the evening      2.  Memory change/confusion/PDD with hallucinations               - As above, now on quetiapine  by her facility.   3.  Chronic LBP             -follows with ortho/neurosx in past      Subjective:   Patricia Fitzpatrick was seen today in follow up for Parkinsons disease.  My previous records were reviewed prior to todays visit as well as outside records available to me.  Last visit, Fredick asked her daughter about asking me about increasing medication because of increasing tremor.  Patient does not ambulate at all at baseline.  I felt that the increase in tremor was likely because they placed her on olanzapine .  I did not at all want to increase her levodopa , especially because she had hallucinations.  I told her they really needed to follow-up with the provider who put her on the olanzapine .  They did that and ***she is no longer on that medication.  She is on low-dose quetiapine .  She was in the emergency room August 26 after a fall out of the bed.  She was evaluated and discharged from the emergency room.  She was back to the emergency room October 2 after an unwitnessed fall at the facility where she was found on the floor.  She was evaluated and released.  She was again in the emergency room January 5 after a fall.  She was in her kitchen with a sauce pan and leaned over and fell.  Patient was treated and released.  Patient had a follow-up appointment here January 16, but no-showed the appointment.  Current prescribed movement disorder medications: Carbidopa /levodopa  25/100, 2/1/1 Nuplazid  34 mg daily  PREVIOUS  MEDICATIONS: carbidopa /levodopa ; pramipexole ; quetiapine ; carbidopa /levodopa  25/100 CR (I changed her to CR b/c of cognition but NP at facility thought perhaps it worsened halluc and changed her back to IR and hallucinations persisted - tremor was worse on the CR)  ALLERGIES:  No Known Allergies  CURRENT MEDICATIONS:  No outpatient medications have been marked as taking for the 03/02/24 encounter (Appointment) with Aleisha Paone, Asberry RAMAN, DO.     Objective:   PHYSICAL EXAMINATION:    VITALS:   There were no vitals filed for this visit.   GEN:  The patient appears stated age and is in NAD. HEENT:  Normocephalic, atraumatic.  The mucous membranes are moist. The superficial temporal arteries are without ropiness or tenderness. CV:  RRR Lungs:  CTAB Neck/HEME:  There are no carotid bruits bilaterally.   Neurological examination:   Orientation: The patient is alert and oriented to person/place.  She looks to daughter for finer aspect of the history. Cranial nerves: There is good facial symmetry. Extraocular muscles are intact. The visual fields are full to confrontational testing. The speech is fluent and clear. Soft palate rises symmetrically and  there is no tongue deviation. Hearing is intact to conversational tone. Sensation: Sensation is intact to light and pinprick throughout (facial, trunk, extremities). Vibration is intact at the bilateral big toe. There is no extinction with double simultaneous stimulation. There is no sensory dermatomal level identified. Motor: Strength is at least antigravity x 4 Movement examination: Tone: There is nl tone in the RUE; there is spasticity on the L, with hand and fingers held in the flexed position Abnormal movements: none Coordination:  There is no significant decremation with RAM's, with any form of RAMS, including alternating supination and pronation of the forearm, hand opening and closing, finger taps, heel taps and toe taps in the right upper/right  lower extremity and left lower extremity.  She has more difficulty in the left upper extremity because of fingers fisted Gait and Station: Daughter states that she does not walk any longer  I have reviewed and interpreted the following labs independently    Chemistry      Component Value Date/Time   NA 138 02/20/2024 1735   K 4.3 02/20/2024 1735   CL 103 02/20/2024 1735   CO2 24 02/20/2024 1735   BUN 29 (H) 02/20/2024 1735   CREATININE 0.73 02/20/2024 1735      Component Value Date/Time   CALCIUM 8.9 02/20/2024 1735   ALKPHOS 74 02/20/2024 1735   AST 25 02/20/2024 1735   ALT <5 02/20/2024 1735   BILITOT 0.3 02/20/2024 1735       Lab Results  Component Value Date   WBC 5.4 02/20/2024   HGB 12.4 02/20/2024   HCT 38.2 02/20/2024   MCV 99.5 02/20/2024   PLT 281 02/20/2024    Lab Results  Component Value Date   TSH 0.608 09/01/2022     Total time spent on today's visit was *** minutes, including both face-to-face time and nonface-to-face time.  Time included that spent on review of records (prior notes available to me/labs/imaging if pertinent), discussing treatment and goals, answering patient's questions and coordinating care.  Cc:  System, Provider Not In  "

## 2024-03-02 ENCOUNTER — Ambulatory Visit: Admitting: Neurology

## 2024-03-02 ENCOUNTER — Encounter: Payer: Self-pay | Admitting: Neurology
